# Patient Record
Sex: Female | Born: 1940 | Race: White | Hispanic: No | Smoking: Never smoker
Health system: Southern US, Community
[De-identification: ages and names within clinical notes are randomized; demographics above are authoritative.]

## PROBLEM LIST (undated history)

## (undated) ENCOUNTER — Ambulatory Visit (HOSPITAL_COMMUNITY): Admission: EM | Discharge status: Absent without leave | Payer: Medicare Other

## (undated) DIAGNOSIS — I219 Acute myocardial infarction, unspecified: Secondary | ICD-10-CM

## (undated) DIAGNOSIS — Z9289 Personal history of other medical treatment: Secondary | ICD-10-CM

## (undated) DIAGNOSIS — T8859XA Other complications of anesthesia, initial encounter: Secondary | ICD-10-CM

## (undated) DIAGNOSIS — R911 Solitary pulmonary nodule: Secondary | ICD-10-CM

## (undated) DIAGNOSIS — K219 Gastro-esophageal reflux disease without esophagitis: Secondary | ICD-10-CM

## (undated) DIAGNOSIS — J8410 Pulmonary fibrosis, unspecified: Secondary | ICD-10-CM

## (undated) DIAGNOSIS — Z86010 Personal history of colonic polyps: Secondary | ICD-10-CM

## (undated) DIAGNOSIS — E7800 Pure hypercholesterolemia, unspecified: Secondary | ICD-10-CM

## (undated) DIAGNOSIS — K649 Unspecified hemorrhoids: Secondary | ICD-10-CM

## (undated) DIAGNOSIS — M1990 Unspecified osteoarthritis, unspecified site: Secondary | ICD-10-CM

## (undated) DIAGNOSIS — N8110 Cystocele, unspecified: Secondary | ICD-10-CM

## (undated) DIAGNOSIS — I2510 Atherosclerotic heart disease of native coronary artery without angina pectoris: Secondary | ICD-10-CM

## (undated) DIAGNOSIS — Z947 Corneal transplant status: Secondary | ICD-10-CM

## (undated) DIAGNOSIS — G4730 Sleep apnea, unspecified: Secondary | ICD-10-CM

## (undated) DIAGNOSIS — Z860101 Personal history of adenomatous and serrated colon polyps: Secondary | ICD-10-CM

## (undated) DIAGNOSIS — I10 Essential (primary) hypertension: Secondary | ICD-10-CM

## (undated) DIAGNOSIS — G4762 Sleep related leg cramps: Secondary | ICD-10-CM

## (undated) DIAGNOSIS — T4145XA Adverse effect of unspecified anesthetic, initial encounter: Secondary | ICD-10-CM

## (undated) DIAGNOSIS — C480 Malignant neoplasm of retroperitoneum: Secondary | ICD-10-CM

## (undated) DIAGNOSIS — E669 Obesity, unspecified: Secondary | ICD-10-CM

## (undated) DIAGNOSIS — R0600 Dyspnea, unspecified: Secondary | ICD-10-CM

## (undated) DIAGNOSIS — I890 Lymphedema, not elsewhere classified: Secondary | ICD-10-CM

## (undated) DIAGNOSIS — D869 Sarcoidosis, unspecified: Secondary | ICD-10-CM

## (undated) DIAGNOSIS — R51 Headache: Secondary | ICD-10-CM

## (undated) DIAGNOSIS — R519 Headache, unspecified: Secondary | ICD-10-CM

## (undated) HISTORY — PX: UMBILICAL HERNIA REPAIR: SHX196

## (undated) HISTORY — DX: Gastro-esophageal reflux disease without esophagitis: K21.9

## (undated) HISTORY — DX: Sarcoidosis, unspecified: D86.9

## (undated) HISTORY — DX: Personal history of colonic polyps: Z86.010

## (undated) HISTORY — DX: Personal history of adenomatous and serrated colon polyps: Z86.0101

## (undated) HISTORY — DX: Acute myocardial infarction, unspecified: I21.9

## (undated) HISTORY — PX: COLONOSCOPY W/ BIOPSIES AND POLYPECTOMY: SHX1376

## (undated) HISTORY — PX: JOINT REPLACEMENT: SHX530

## (undated) HISTORY — PX: CATARACT EXTRACTION: SUR2

## (undated) HISTORY — PX: TONSILLECTOMY AND ADENOIDECTOMY: SHX28

## (undated) HISTORY — DX: Obesity, unspecified: E66.9

## (undated) HISTORY — PX: EYE SURGERY: SHX253

---
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
## 1968-05-13 HISTORY — PX: APPENDECTOMY: SHX54

## 1968-05-13 HISTORY — PX: CHOLECYSTECTOMY: SHX55

## 1976-05-13 HISTORY — PX: TUBAL LIGATION: SHX77

## 1988-05-13 HISTORY — PX: ABDOMINAL HYSTERECTOMY: SHX81

## 1999-05-10 ENCOUNTER — Encounter: Admission: RE | Admit: 1999-05-10 | Discharge: 1999-05-10 | Payer: Self-pay | Admitting: Family Medicine

## 1999-05-10 ENCOUNTER — Encounter: Payer: Self-pay | Admitting: Family Medicine

## 2000-07-01 ENCOUNTER — Encounter: Payer: Self-pay | Admitting: Family Medicine

## 2000-07-01 ENCOUNTER — Encounter: Admission: RE | Admit: 2000-07-01 | Discharge: 2000-07-01 | Payer: Self-pay | Admitting: Family Medicine

## 2001-07-03 ENCOUNTER — Encounter: Admission: RE | Admit: 2001-07-03 | Discharge: 2001-07-03 | Payer: Self-pay | Admitting: Family Medicine

## 2001-07-03 ENCOUNTER — Encounter: Payer: Self-pay | Admitting: Family Medicine

## 2002-05-13 HISTORY — PX: CARPAL TUNNEL RELEASE: SHX101

## 2002-09-08 ENCOUNTER — Encounter: Admission: RE | Admit: 2002-09-08 | Discharge: 2002-09-08 | Payer: Self-pay | Admitting: Family Medicine

## 2002-09-08 ENCOUNTER — Encounter: Payer: Self-pay | Admitting: Family Medicine

## 2003-09-12 ENCOUNTER — Encounter: Admission: RE | Admit: 2003-09-12 | Discharge: 2003-09-12 | Payer: Self-pay | Admitting: Family Medicine

## 2004-04-11 ENCOUNTER — Ambulatory Visit: Payer: Self-pay | Admitting: Pulmonary Disease

## 2004-04-25 ENCOUNTER — Ambulatory Visit (HOSPITAL_BASED_OUTPATIENT_CLINIC_OR_DEPARTMENT_OTHER): Admission: RE | Admit: 2004-04-25 | Discharge: 2004-04-25 | Payer: Self-pay | Admitting: Pulmonary Disease

## 2004-04-25 ENCOUNTER — Ambulatory Visit: Payer: Self-pay | Admitting: Pulmonary Disease

## 2004-05-13 HISTORY — PX: KNEE ARTHROSCOPY: SHX127

## 2004-06-04 ENCOUNTER — Ambulatory Visit: Payer: Self-pay | Admitting: Pulmonary Disease

## 2004-06-25 ENCOUNTER — Ambulatory Visit (HOSPITAL_COMMUNITY): Admission: RE | Admit: 2004-06-25 | Discharge: 2004-06-25 | Payer: Self-pay | Admitting: Family Medicine

## 2004-09-07 ENCOUNTER — Ambulatory Visit: Payer: Self-pay | Admitting: Pulmonary Disease

## 2004-10-05 ENCOUNTER — Ambulatory Visit: Payer: Self-pay | Admitting: Pulmonary Disease

## 2004-10-17 ENCOUNTER — Encounter: Admission: RE | Admit: 2004-10-17 | Discharge: 2004-10-17 | Payer: Self-pay | Admitting: Family Medicine

## 2005-05-13 HISTORY — PX: HERNIA REPAIR: SHX51

## 2005-06-21 ENCOUNTER — Encounter: Admission: RE | Admit: 2005-06-21 | Discharge: 2005-06-21 | Payer: Self-pay | Admitting: Family Medicine

## 2005-06-28 ENCOUNTER — Emergency Department (HOSPITAL_COMMUNITY): Admission: EM | Admit: 2005-06-28 | Discharge: 2005-06-28 | Payer: Self-pay | Admitting: Family Medicine

## 2005-07-10 ENCOUNTER — Ambulatory Visit: Payer: Self-pay | Admitting: Pulmonary Disease

## 2005-08-08 ENCOUNTER — Ambulatory Visit: Payer: Self-pay | Admitting: Pulmonary Disease

## 2005-09-24 ENCOUNTER — Emergency Department (HOSPITAL_COMMUNITY): Admission: EM | Admit: 2005-09-24 | Discharge: 2005-09-24 | Payer: Self-pay | Admitting: Emergency Medicine

## 2006-01-21 ENCOUNTER — Encounter: Admission: RE | Admit: 2006-01-21 | Discharge: 2006-01-21 | Payer: Self-pay | Admitting: Family Medicine

## 2006-02-21 ENCOUNTER — Inpatient Hospital Stay (HOSPITAL_COMMUNITY): Admission: RE | Admit: 2006-02-21 | Discharge: 2006-02-23 | Payer: Self-pay | Admitting: Surgery

## 2007-03-20 ENCOUNTER — Encounter: Admission: RE | Admit: 2007-03-20 | Discharge: 2007-03-20 | Payer: Self-pay | Admitting: Family Medicine

## 2007-06-04 ENCOUNTER — Encounter: Admission: RE | Admit: 2007-06-04 | Discharge: 2007-06-04 | Payer: Self-pay | Admitting: Family Medicine

## 2008-03-22 ENCOUNTER — Encounter: Admission: RE | Admit: 2008-03-22 | Discharge: 2008-03-22 | Payer: Self-pay | Admitting: Family Medicine

## 2008-04-20 ENCOUNTER — Encounter: Admission: RE | Admit: 2008-04-20 | Discharge: 2008-04-20 | Payer: Self-pay | Admitting: Family Medicine

## 2008-05-13 HISTORY — PX: SHOULDER SURGERY: SHX246

## 2009-01-24 DIAGNOSIS — I10 Essential (primary) hypertension: Secondary | ICD-10-CM | POA: Insufficient documentation

## 2009-01-24 DIAGNOSIS — E785 Hyperlipidemia, unspecified: Secondary | ICD-10-CM | POA: Insufficient documentation

## 2009-01-25 ENCOUNTER — Ambulatory Visit: Payer: Self-pay | Admitting: Pulmonary Disease

## 2009-01-25 DIAGNOSIS — G4733 Obstructive sleep apnea (adult) (pediatric): Secondary | ICD-10-CM

## 2009-01-28 ENCOUNTER — Encounter: Admission: RE | Admit: 2009-01-28 | Discharge: 2009-01-28 | Payer: Self-pay | Admitting: Orthopedic Surgery

## 2009-02-22 ENCOUNTER — Ambulatory Visit (HOSPITAL_COMMUNITY): Admission: RE | Admit: 2009-02-22 | Discharge: 2009-02-23 | Payer: Self-pay | Admitting: Orthopedic Surgery

## 2009-03-09 ENCOUNTER — Telehealth (INDEPENDENT_AMBULATORY_CARE_PROVIDER_SITE_OTHER): Payer: Self-pay | Admitting: *Deleted

## 2009-04-17 ENCOUNTER — Encounter: Payer: Self-pay | Admitting: Pulmonary Disease

## 2009-04-17 ENCOUNTER — Telehealth: Payer: Self-pay | Admitting: Pulmonary Disease

## 2009-04-24 ENCOUNTER — Encounter: Admission: RE | Admit: 2009-04-24 | Discharge: 2009-04-24 | Payer: Self-pay | Admitting: Family Medicine

## 2010-04-25 ENCOUNTER — Encounter
Admission: RE | Admit: 2010-04-25 | Discharge: 2010-04-25 | Payer: Self-pay | Source: Home / Self Care | Attending: Family Medicine | Admitting: Family Medicine

## 2010-05-13 HISTORY — PX: OTHER SURGICAL HISTORY: SHX169

## 2010-05-13 HISTORY — PX: CORONARY ANGIOPLASTY: SHX604

## 2010-06-15 ENCOUNTER — Ambulatory Visit: Payer: Self-pay | Admitting: Pulmonary Disease

## 2010-06-18 ENCOUNTER — Encounter: Payer: Self-pay | Admitting: Pulmonary Disease

## 2010-06-18 ENCOUNTER — Ambulatory Visit (INDEPENDENT_AMBULATORY_CARE_PROVIDER_SITE_OTHER): Payer: Self-pay | Admitting: Pulmonary Disease

## 2010-06-18 DIAGNOSIS — G4733 Obstructive sleep apnea (adult) (pediatric): Secondary | ICD-10-CM

## 2010-06-28 NOTE — Assessment & Plan Note (Signed)
Summary: rov for osa   Primary Provider/Referring Provider:  [name]  CC:  Follow up appt on OSA.  States she wears her cpap machine every night.  Approx 10 hours per night.  Denies any complaints with mask or pressure.  Pt does state her machine makes "weird noises."  Pt also needs surgical clearance.  scheduled for L knee arthroscopy on March 19 with Dr. Maureen Ralphs.  History of Present Illness: the pt comes in today for f/u of her known osa.  She is wearing cpap compliantly, and overall feels she is doing well with the device.  She is having some "flapping" with her mask that may wake her up, and her machine is making noise at times.  Her husband has also heard breakthru snoring and apnea when she turns supine during the night.  Her weight is actually down 10 pounds from last visit.  Current Medications (verified): 1)  Benicar 40 Mg Tabs (Olmesartan Medoxomil) .... Take 1 Tablet By Mouth Once A Day 2)  Diltiazem Hcl Coated Beads 300 Mg Xr24h-Tab (Diltiazem Hcl Coated Beads) .... Take 1 Tablet By Mouth Once A Day 3)  Furosemide 40 Mg Tabs (Furosemide) .... Take 1 Tablet By Mouth Once A Day 4)  Pravastatin Sodium 40 Mg Tabs (Pravastatin Sodium) .... Take 1 Tablet By Mouth Once A Day 5)  Aspirin Childrens 81 Mg Chew (Aspirin) 6)  Centrum  Tabs (Multiple Vitamins-Minerals) .... Take 1 Tablet By Mouth Once A Day 7)  Vitamin B-6 Cr 200 Mg Cr-Tabs (Pyridoxine Hcl) .... Take 1 Tablet By Mouth Once A Day 8)  Calcium 1200 1200-1000 Mg-Unit Chew (Calcium Carbonate-Vit D-Min) .... Take 1 Tablet By Mouth Once A Day 9)  Vitamin D 2000 Unit Tabs (Cholecalciferol) .... Take 1 Tablet By Mouth Once A Day 10)  Prilosec 20 Mg Cpdr (Omeprazole) .... Take 1 Tablet By Mouth Once A Day  Allergies (verified): 1)  ! Codeine 2)  ! Pcn 3)  ! Neomycin  Review of Systems       The patient complains of productive cough.  The patient denies shortness of breath with activity, shortness of breath at rest,  non-productive cough, coughing up blood, chest pain, irregular heartbeats, acid heartburn, indigestion, loss of appetite, weight change, abdominal pain, difficulty swallowing, sore throat, tooth/dental problems, headaches, nasal congestion/difficulty breathing through nose, sneezing, itching, ear ache, anxiety, depression, hand/feet swelling, joint stiffness or pain, rash, change in color of mucus, and fever.    Vital Signs:  Patient profile:   70 year old female Height:      62.5 inches Weight:      221.38 pounds BMI:     39.99 O2 Sat:      98. % on Room air Temp:     98.4 degrees F oral Pulse rate:   62 / minute BP sitting:   134 / 84  (left arm) Cuff size:   large  Vitals Entered By: Matthew Folks LPN (June 18, 930 12:06 PM)  O2 Flow:  Room air CC: Follow up appt on OSA.  States she wears her cpap machine every night.  Approx 10 hours per night.  Denies any complaints with mask or pressure.  Pt does state her machine makes "weird noises."  Pt also needs surgical clearance.  scheduled for L knee arthroscopy on March 19 with Dr. Maureen Ralphs Comments Medications reviewed with patient Matthew Folks LPN  June 18, 6710 12:06 PM     Physical Exam  General:  ow  female in nad Nose:  no skin breakdown or pressure necrosis from cpap mask Extremities:  1+ edema, no cyanosis  Neurologic:  alert, does not appear sleepy, moves all 4.   Impression & Recommendations:  Problem # 1:  OBSTRUCTIVE SLEEP APNEA (ICD-327.23) the pt is wearing cpap compliantly, but has a few issues that we need to troubleshoot.  I suspect that she needs to get seals to her mask more frequently than she is, or perhaps need to pull her current mask tighter?.  Will have dme check this, as well as the functioning of her machine.  I think it is also worthwhile rechecking her pressure since her husband has noted breakthru snoring whenever she is on her back.  Faylene Kurtz is doing well with the device and feels she sleeps  well with adequate daytime alertness.  Her weight is down 10pounds since her last visit, and she is continuing to work on this.  Regarding her upcoming knee surgery, I see no reason she cannot have this from a sleep apnea standpoint.  I have asked her to bring her mask and headgear to the hospital, and they will provide her with an auto machine during her stay there.    Medications Added to Medication List This Visit: 1)  Prilosec 20 Mg Cpdr (Omeprazole) .... Take 1 tablet by mouth once a day  Other Orders: Est. Patient Level III (78412) DME Referral (DME)  Patient Instructions: 1)  will see how often you are allowed to change your seals on your mask 2)  will get sleep med to check your machine and see if it needs to be replaced. 3)  will recheck your pressure at home, and let you known the results. 4)  work on weight loss 5)  followup with me in one year, but call if something is not going well so I can help you troubleshoot.

## 2010-07-12 HISTORY — PX: TOTAL KNEE ARTHROPLASTY: SHX125

## 2010-07-24 ENCOUNTER — Other Ambulatory Visit: Payer: Self-pay | Admitting: Orthopedic Surgery

## 2010-07-24 ENCOUNTER — Encounter (HOSPITAL_COMMUNITY): Payer: Medicare Other

## 2010-07-24 ENCOUNTER — Ambulatory Visit (HOSPITAL_COMMUNITY)
Admission: RE | Admit: 2010-07-24 | Discharge: 2010-07-24 | Disposition: A | Discharge status: Home / Self Care | Payer: Medicare Other | Source: Ambulatory Visit | Attending: Orthopedic Surgery | Admitting: Orthopedic Surgery

## 2010-07-24 ENCOUNTER — Other Ambulatory Visit (HOSPITAL_COMMUNITY): Payer: Self-pay | Admitting: Orthopedic Surgery

## 2010-07-24 DIAGNOSIS — Z01818 Encounter for other preprocedural examination: Secondary | ICD-10-CM | POA: Insufficient documentation

## 2010-07-24 DIAGNOSIS — I517 Cardiomegaly: Secondary | ICD-10-CM | POA: Insufficient documentation

## 2010-07-24 DIAGNOSIS — M1710 Unilateral primary osteoarthritis, unspecified knee: Secondary | ICD-10-CM

## 2010-07-24 DIAGNOSIS — I10 Essential (primary) hypertension: Secondary | ICD-10-CM | POA: Insufficient documentation

## 2010-07-24 DIAGNOSIS — Z7982 Long term (current) use of aspirin: Secondary | ICD-10-CM | POA: Insufficient documentation

## 2010-07-24 DIAGNOSIS — Z79899 Other long term (current) drug therapy: Secondary | ICD-10-CM | POA: Insufficient documentation

## 2010-07-24 DIAGNOSIS — I77819 Aortic ectasia, unspecified site: Secondary | ICD-10-CM | POA: Insufficient documentation

## 2010-07-24 DIAGNOSIS — Z01811 Encounter for preprocedural respiratory examination: Secondary | ICD-10-CM | POA: Insufficient documentation

## 2010-07-24 DIAGNOSIS — M5380 Other specified dorsopathies, site unspecified: Secondary | ICD-10-CM | POA: Insufficient documentation

## 2010-07-24 DIAGNOSIS — Z01812 Encounter for preprocedural laboratory examination: Secondary | ICD-10-CM | POA: Insufficient documentation

## 2010-07-24 LAB — CBC
MCHC: 32.4 g/dL (ref 30.0–36.0)
MCV: 94.2 fL (ref 78.0–100.0)
Platelets: 210 10*3/uL (ref 150–400)
RDW: 14.3 % (ref 11.5–15.5)

## 2010-07-24 LAB — APTT: aPTT: 29 seconds (ref 24–37)

## 2010-07-24 LAB — URINALYSIS, ROUTINE W REFLEX MICROSCOPIC
Bilirubin Urine: NEGATIVE
Ketones, ur: NEGATIVE mg/dL
Nitrite: NEGATIVE

## 2010-07-24 LAB — COMPREHENSIVE METABOLIC PANEL
Alkaline Phosphatase: 79 U/L (ref 39–117)
BUN: 9 mg/dL (ref 6–23)
CO2: 29 mEq/L (ref 19–32)
Chloride: 108 mEq/L (ref 96–112)
GFR calc Af Amer: >60 mL/min (ref >60)
Potassium: 3.7 mEq/L (ref 3.5–5.1)
Total Bilirubin: 0.6 mg/dL (ref 0.3–1.2)

## 2010-07-24 LAB — SURGICAL PCR SCREEN
MRSA, PCR: NEGATIVE
Staphylococcus aureus: POSITIVE — AB

## 2010-07-30 ENCOUNTER — Inpatient Hospital Stay (HOSPITAL_COMMUNITY)
Admission: RE | Admit: 2010-07-30 | Discharge: 2010-08-02 | DRG: 470 | Disposition: A | Discharge status: Home Health | Payer: Medicare Other | Source: Ambulatory Visit | Attending: Orthopedic Surgery | Admitting: Orthopedic Surgery

## 2010-07-30 DIAGNOSIS — M1710 Unilateral primary osteoarthritis, unspecified knee: Principal | ICD-10-CM | POA: Diagnosis present

## 2010-07-30 DIAGNOSIS — E876 Hypokalemia: Secondary | ICD-10-CM | POA: Diagnosis not present

## 2010-07-30 DIAGNOSIS — E669 Obesity, unspecified: Secondary | ICD-10-CM | POA: Diagnosis present

## 2010-07-30 DIAGNOSIS — I10 Essential (primary) hypertension: Secondary | ICD-10-CM | POA: Diagnosis present

## 2010-07-30 DIAGNOSIS — K219 Gastro-esophageal reflux disease without esophagitis: Secondary | ICD-10-CM | POA: Diagnosis present

## 2010-07-31 LAB — BASIC METABOLIC PANEL
BUN: 6 mg/dL (ref 6–23)
CO2: 29 mEq/L (ref 19–32)
Calcium: 7.9 mg/dL — ABNORMAL LOW (ref 8.4–10.5)
Chloride: 105 mEq/L (ref 96–112)
Creatinine, Ser: 0.62 mg/dL (ref 0.4–1.2)
GFR calc Af Amer: >60 mL/min (ref >60)
GFR calc non Af Amer: >60 mL/min (ref >60)
Sodium: 137 mEq/L (ref 135–145)

## 2010-07-31 LAB — CBC
MCH: 30.4 pg (ref 26.0–34.0)
MCHC: 32.2 g/dL (ref 30.0–36.0)
RDW: 14.3 % (ref 11.5–15.5)
WBC: 11.2 10*3/uL — ABNORMAL HIGH (ref 4.0–10.5)

## 2010-08-01 LAB — BASIC METABOLIC PANEL
CO2: 29 mEq/L (ref 19–32)
Calcium: 8.1 mg/dL — ABNORMAL LOW (ref 8.4–10.5)
Chloride: 104 mEq/L (ref 96–112)
GFR calc non Af Amer: >60 mL/min (ref >60)
Glucose, Bld: 120 mg/dL — ABNORMAL HIGH (ref 70–99)
Potassium: 3.4 mEq/L — ABNORMAL LOW (ref 3.5–5.1)

## 2010-08-01 LAB — CBC
Hemoglobin: 11.9 g/dL — ABNORMAL LOW (ref 12.0–15.0)
RDW: 14.4 % (ref 11.5–15.5)
WBC: 8.8 10*3/uL (ref 4.0–10.5)

## 2010-08-01 NOTE — Op Note (Signed)
NAME:  Isabella [name], Isabella [name]                 ACCOUNT NO.:  [id number]  MEDICAL RECORD NO.:  [id number]           PATIENT TYPE:  I  LOCATION:  3419                         FACILITY:  [facility]  PHYSICIAN:  Gaynelle Arabian, M.D.    DATE OF BIRTH:  10-31-1940  DATE OF PROCEDURE:  07/30/2010 DATE OF DISCHARGE:                              OPERATIVE REPORT   PREOPERATIVE DIAGNOSIS:  Osteoarthritis, left knee.  POSTOPERATIVE DIAGNOSIS:  Osteoarthritis, left knee.  PROCEDURE:  Left total knee arthroplasty.  SURGEON:  Gaynelle Arabian, M.D.  ASSISTANT:  Toniann Fail, PA-C  ANESTHESIA:  Spinal.  ESTIMATED BLOOD LOSS:  Minimal.  DRAINS:  Hemovac x1.  TOURNIQUET TIME:  41 minutes at 300 mmHg.  COMPLICATIONS:  None.  CONDITION:  Stable to recovery.  BRIEF CLINICAL NOTE:  Isabella [name] is a 70 year old female with end-stage arthritis of the left knee with progressively worsening pain and dysfunction.  She has failed nonoperative management and presents for total knee arthroplasty.  PROCEDURE IN DETAIL:  After successful administration of spinal anesthetic, A tourniquet was placed high on the left thigh and left lower extremity prepped and draped in usual sterile fashion.  Extremity was wrapped in Esmarch, knee flexed, tourniquet inflated to 300 mmHg.  A midline incision was then made with a 10 blade through subcutaneous tissue to the level of the extensor mechanism.  Fresh blade was used to make a medial parapatellar arthrotomy.  Soft tissue on the proximal medial tibia was subperiosteally elevated to the joint line with the knife into the semimembranosus bursa with a Cobb elevator.  Soft tissue laterally was elevated with attention being paid to avoiding the patellar tendon on tibial tubercle.  The patella was everted, knee flexed 90 degrees, and ACL and PCL removed.  Drill was used to create a starting hole in the distal femur.  The canal was thoroughly irrigated. The 5-degree left valgus  alignment guide was placed and a distal femoral cutting block pinned to remove 11 mm off the distal femur.  Distal femoral resection was made with an oscillating saw.  The tibia subluxed forward and the menisci removed.  Extramedullary tibial alignment guide was placed referencing proximally at the medial aspect of the tibial tubercle and distally along the 2nd metatarsal axis and tibial crest.  The block was pinned to remove 2 mm off the more deficient medial side.  Tibial resection was made with an oscillating saw.  Size 2.5 was the most appropriate tibial component and the proximal tibia was prepared with a modular drill and keel punch for the size 2.5.  Femoral sizing guide was placed, size 3 was most appropriate.  Rotation was marked off the epicondylar axis and confirmed by creating a rectangular flexion gap at 90 degrees.  The block was pinned in this rotation and the anterior, posterior, and chamfer cuts were made. Intercondylar block was placed and that cut was made.  Trial size 3 posterior stabilized femur was placed.  A 10 mm posterior stabilizedrotating platform insert trial was placed.  With the 10, full extension was achieved with excellent varus-valgus and anterior-posterior balance throughout full range of  motion.  Patella was everted, thickness measured to be 22 mm.  Freehand resection was taken at 13 mm, 35 template was placed, lug holes were drilled, trial patella was placed and it tracked normally.  Osteophytes removed off the posterior femur with the trial in place.  All trials were removed and the cut bone surfaces were prepared with pulsatile lavage.  Cement was mixed and once ready for implantation, the size 2.5 mobile bearing tibial tray, size 3 posterior stabilized femur, and 35 patella were cemented into place and the patella was held with a clamp.  Trial 10 mm insert was placed, knee held in full extension, all extruded cement removed.  When the cement was  fully hardened, then the permanent 10 mm posterior stabilized rotating platform insert was placed in the tibial tray.  Wound was copiously irrigated with saline solution and then the arthrotomy closed over Hemovac drain with interrupted #1 PDS.  Flexion against gravity was about 135 degrees.  Tourniquet was then released for a total time of 31 minutes.  Subcu was then closed with interrupted 2-0 Vicryl, subcuticular with running 4-0 Monocryl.  Catheter for Marcaine pain pump was placed and the pump was initiated.  Incision was cleaned and dried and Steri-Strips and a bulky sterile dressing applied.  She was then placed into a knee immobilizer, awakened, and transported to Recovery in stable condition.     Gaynelle Arabian, M.D.     FA/MEDQ  D:  07/30/2010  T:  07/31/2010  Job:  [id number]  Electronically Signed by Gaynelle Arabian M.D. on 08/01/2010 08:20:39 AM

## 2010-08-02 LAB — BASIC METABOLIC PANEL
Calcium: 8.4 mg/dL (ref 8.4–10.5)
Chloride: 107 mEq/L (ref 96–112)
Creatinine, Ser: 0.62 mg/dL (ref 0.4–1.2)
GFR calc Af Amer: >60 mL/min (ref >60)
GFR calc non Af Amer: >60 mL/min (ref >60)
Glucose, Bld: 118 mg/dL — ABNORMAL HIGH (ref 70–99)
Potassium: 4.3 mEq/L (ref 3.5–5.1)

## 2010-08-02 LAB — CBC: MCH: 30.2 pg (ref 26.0–34.0)

## 2010-08-06 NOTE — H&P (Signed)
NAME:  Isabella [name], Isabella [name]                 ACCOUNT NO.:  [id number]  MEDICAL RECORD NO.:                     PATIENT TYPE:  LOCATION:                                 FACILITY:  PHYSICIAN:  Gaynelle Arabian, M.D.         DATE OF BIRTH:  DATE OF ADMISSION:  07/30/2010 DATE OF DISCHARGE:                             HISTORY & PHYSICAL   DATE OF OFFICE VISIT AND HISTORY AND PHYSICAL:  July 26, 2010  DATE OF ADMISSION:  July 30, 2010  CHIEF COMPLAINT:  Left knee pain.  HISTORY OF PRESENT ILLNESS:  The patient is a 70 year old female who has been seen by Dr. Wynelle Link for ongoing left knee pain and arthritis been refractory to nonoperative management.  Has arthritis in both knees but the left is more symptomatic and problematic than the right.  It was felt she would benefit from undergoing surgical intervention.  Risks and benefits have been discussed.  She has been seen preoperatively by Dr. Inda Merlin and she is felt to be stable for upcoming surgery.  ALLERGIES: 1. PENICILLIN caused rash and trouble breathing at age 70. 2. NEOMYCIN caused swelling topically in that area. 3. CODEINE causes "tightness in chest" and the patient states she is     sensitive to medications.  CURRENT MEDICATIONS: 1. Benicar 40 mg daily. 2. Diltiazem 300 mg daily. 3. Furosemide 40 mg daily. 4. Pravastatin 40 mg daily. 5. Baby aspirin. 6. Omeprazole 20.6 mg daily. 7. Centrum Silver. 8. Multivitamin. 9. Vitamin B6. 10.Calcium. 11.Vitamin D. 12.Glucosamine chondroitin.  PAST MEDICAL HISTORY: 1. Hypertension. 2. Sleep apnea. 3. Hypercholesterolemia. 4. Past history of phlebitis (but no DVT or PE) and arthritis.  PAST SURGICAL HISTORY:  Tonsils at age 70, gallbladder and appendix surgery, hysterectomy with hernia repair, carpal tunnel bilaterally, right knee arthroscopy, hernia repair, left eye cataract surgery, left eye corneal transplant, right shoulder arthroscopic surgery, sleep apnea procedure and then  another cataract and corneal transplant.  FAMILY HISTORY:  Father deceased age 41 with cancer.  Mother deceased at age 57 (choked on food).  SOCIAL HISTORY:  Married, retired, nonsmoker.  No alcohol.  Husband will be assisting with care after surgery.  She has one step entering her home.  She does have a living will.  REVIEW OF SYSTEMS:  GENERAL:  No fevers, chills, night sweats.  NEURO: No seizures, syncope, or paralysis.  RESPIRATORY:  No shortness breath, productive cough or hemoptysis.  CARDIOVASCULAR:  No chest pain, no orthopnea.  GI:  No nausea, vomiting, diarrhea, or constipation.  GU: No dysuria, hematuria, or discharge.  Musculoskeletal:  Left knee.  PHYSICAL EXAMINATION:  VITAL SIGNS:  Pulse 64, respirations 12, blood pressure 144/88. GENERAL:  A : 70 year old white female, well nourished, well developed, in no acute distress.  She is alert, oriented, cooperative. HEENT:  Normocephalic, atraumatic.  Pupils round and reactive.  EOMs intact.  No glasses. NECK:  Supple. CHEST:  Clear. HEART:  Regular rate and rhythm without murmur.  S1, S2 noted. ABDOMEN:  Soft, nontender, slightly round.  Bowel sounds present. RECTAL:  Not done, not pertinent to present illness. BREASTS:  Not done, not pertinent to present illness. GENITALIA:  Not done, not pertinent to present illness. EXTREMITIES:  Left knee no effusion, no instability.  Positive crepitus. Range of motion 5 to 120.  IMPRESSION:  Osteoarthritis, left knee.  PLAN:  The patient will be admitted to [facility], undergo left total knee replacement arthroplasty.  Surgery will be performed by Dr. Gaynelle Arabian.     Alexzandrew L. Dara Lords, P.A.C.   ______________________________ Gaynelle Arabian, M.D.    ALP/MEDQ  D:  07/28/2010  T:  07/28/2010  Job:  [id number]  cc:   Frann Rider, M.D. Fax: [phone]  Kathee Delton, MD,FCCP [address] [address]  Electronically Signed by Mickel Crow P.A.C. on 08/03/2010 10:30:40 AM Electronically Signed by Gaynelle Arabian M.D. on 08/06/2010 07:11:00 AM

## 2010-08-16 LAB — BASIC METABOLIC PANEL
BUN: 9 mg/dL (ref 6–23)
CO2: 30 mEq/L (ref 19–32)
Chloride: 101 mEq/L (ref 96–112)
GFR calc Af Amer: >60 mL/min (ref >60)
Glucose, Bld: 100 mg/dL — ABNORMAL HIGH (ref 70–99)
Sodium: 139 mEq/L (ref 135–145)

## 2010-08-16 LAB — CBC
MCV: 93.9 fL (ref 78.0–100.0)
Platelets: 220 10*3/uL (ref 150–400)
RBC: 4.65 MIL/uL (ref 3.87–5.11)
WBC: 5.9 10*3/uL (ref 4.0–10.5)

## 2010-09-22 ENCOUNTER — Other Ambulatory Visit: Payer: Self-pay | Admitting: Pulmonary Disease

## 2010-09-22 DIAGNOSIS — G4733 Obstructive sleep apnea (adult) (pediatric): Secondary | ICD-10-CM

## 2010-09-28 NOTE — Procedures (Signed)
NAME:  Isabella [name], Isabella [name] NO.:  [id number]   MEDICAL RECORD NO.:  [id number]          PATIENT TYPE:  OUT   LOCATION:  SLEEP CENTER                 FACILITY:  [facility]   PHYSICIAN:  Danton Sewer, M.D. [facility] DATE OF BIRTH:  08-04-1940   DATE OF STUDY:  04/25/2004                              NOCTURNAL POLYSOMNOGRAM   REFERRING PHYSICIAN:  Danton Sewer, MD   INDICATIONS FOR STUDY:  Hypersomnia with sleep apnea. Epworth sleepiness  score is 4.   SLEEP ARCHITECTURE:  The patient has a total sleep time of 307 minutes with  a sleep efficiency of 89%. There was decreased REM and slow wave sleep.  Latency to sleep onset was normal, however REM onset was prolonged.   IMPRESSION/RECOMMENDATIONS:  1.  Mild to moderate obstructive sleep apnea with a respiratory disturbance      index of 16 events and O2 desaturation events as low as 87%. The events      were worse in the supine position.  2.  Loud snoring noted throughout the snoring.  3.  No clinically significant cardiac arrhythmias.      KC/MEDQ  D:  04/30/2004 15:14:41  T:  05/01/2004 18:31:14  Job:  [id number]

## 2010-09-28 NOTE — Op Note (Signed)
NAME:  Isabella [name], Isabella [name]                 ACCOUNT NO.:  [id number]   MEDICAL RECORD NO.:  [id number]          PATIENT TYPE:  INP   LOCATION:  [place]                         FACILITY:  [facility]   PHYSICIAN:  Marcello Moores A. Cornett, M.D.DATE OF BIRTH:  1941-01-04   DATE OF PROCEDURE:  02/21/2006  DATE OF DISCHARGE:  02/23/2006                                 OPERATIVE REPORT   PREOPERATIVE DIAGNOSIS:  Recurrent ventral hernia.   POSTOPERATIVE DIAGNOSIS:  Incarcerated recurrent ventral hernia.   PROCEDURE:  Laparoscopic ventral hernia repair with mesh.   SURGEON:  Erroll Luna, MD   ASSISTANT:  Dr. Magdalene River.   ANESTHESIA:  General endotracheal anesthesia with 20 mL of 0.25% Sensorcaine  local with epinephrine EBL 20 mL.   SPECIMEN:  None.   DRAINS:  None.   INDICATIONS FOR PROCEDURE:  The patient is a 70 year old female who had a  previous hysterectomy and ventral hernia repair back in the 1990s.  This has  recurred in an area just below her umbilicus.  It had been giving her some  problems and she wished to have repair secondary to discomfort.  The  procedure was discussed with the patient as well as the potential need to  convert to an open procedure as well as complications of the procedure.  She  voiced understanding agreed to proceed.   DESCRIPTION OF PROCEDURE:  The patient was brought to the operating room,  placed supine.  After induction of general endotracheal anesthesia the  abdomen prepped and draped in sterile fashion.  Foley catheter was placed  and an orogastric tube was then placed.  After sterile prep and drape, I  elected to use a 10 mm OptiVu for access to the abdominal cavity.  The left  lower quadrant 1 cm incision was made and I placed the camera within the  OptiVu port.  We then under direct camera guidance, guided the OptiVu  through the abdominal wall layers and also to the peritoneum into the  abdominal cavity.  Pneumoperitoneum was established to 15 mmHg CO2.   The  camera was replaced.  Upon inspection of the abdominal cavity I saw no  evidence of injury to solid or hollow organ with insertion of the OptiVu.  There is significant adhesions to the anterior abdominal wall and hernia  defect was identified with a piece of incarcerated omentum which was not  ischemic.  I placed a 5 mm left lower quadrant port under direct vision with  local anesthesia.  I used the Endo shears take down the adhesions to the  anterior abdominal wall.  Care taken not to injure the bowel.  There was  some mild bleeding with that.  I used the harmonic scalpel to control one  bleeding vessel with the omentum with care taken not to get it close the  bowel.  Once the anterior abdominal wall adhesions were down I was able to  identify numerous small fascial defects starting just below the umbilicus  extending to just above the umbilicus.  Total area measured roughly 9 cm x 7  cm.  This was  measured using the laparoscopic grasper whose jaws were 3 cm  when opened maximally.  I then used a spinal needle to mark the external  landmarks and this was inserted under direct vision into the abdominal  cavity with care taken not to injure the bowel.  Luetta Nutting were made to get an  idea of the size of this and this was closer to roughly 12 x 15 cm.  I used  a 15 x 20 piece of Parietex mesh.  This seemed to give me a least 3 cm of  overlap.  Eight stay sutures were placed using #1 Novofils in the Parietex  mesh and then the mesh was placed a water to saturate it.  It was rolled up  and then placed through the 10-mm port into the intra-abdominal cavity.  I  had to place two additional 5-mm ports, one of the patient's right upper  quadrant and the second in the patient's right lower quadrant to help with  this.  With the help of the 5 mm camera we were able to position the mesh  and then unraveled it so it laid under the appropriate marks we made on the  anterior abdominal wall to orient it.   We then made eight small incisions in  a circular pattern that out laid the hernia defect plus overlap.  We then  used the Storz needle passer and this was passed through the abdominal wall.  The sutures were grabbed and pulled up and held in place with hemostats.  We  pulled all eight sutures up and tied these down and the mesh lay relatively  flat with minimal pleating noted.  The Protac was then used to tack the mesh  circumferentially and this made the mesh lay quite nicely.  There is a mild  amount of redundancy noted.  There was no undue tension.  Once the entire  mesh was tacked down and laid quite nicely and covered the defect with more  than adequate overlap of 3 cm.  I then released the CO2 and then reinflated  the abdominal cavity to inspect for any evidence of bowel injury and saw  none.  Hemostasis was excellent.  At this point, we decided to remove our  ports and released the CO2.  I did not see any evidence of port site  bleeding.  Once all ports were subsequently removed and passed off the  field.  The incisions were closed 4-0 Monocryl subcuticular stitches.  Steri-  Strips and dry dressings were applied.  All final counts of sponge, needle  and instruments were found to be correct this portion of the case.  The  patient was awoke, taken to recovery in satisfactory condition.      Thomas A. Cornett, M.D.  Electronically Signed     TAC/MEDQ  D:  02/21/2006  T:  02/24/2006  Job:  [id number]   cc:   Frann Rider, M.D.  Fax: [phone]

## 2010-10-04 ENCOUNTER — Telehealth: Payer: Self-pay | Admitting: Pulmonary Disease

## 2010-10-04 ENCOUNTER — Other Ambulatory Visit: Payer: Self-pay | Admitting: Pulmonary Disease

## 2010-10-04 DIAGNOSIS — G4733 Obstructive sleep apnea (adult) (pediatric): Secondary | ICD-10-CM

## 2010-10-04 NOTE — Telephone Encounter (Signed)
Will send order for new cpap machine

## 2010-10-04 NOTE — Telephone Encounter (Signed)
KC, pt was seen last 2/12- is this okay to send CPAP order to [facility]? Pls advise thanks!

## 2010-10-05 NOTE — Telephone Encounter (Signed)
Called and lmom to make pt aware that the new order for new cpap machine has been sent per [facility].

## 2010-10-05 NOTE — Telephone Encounter (Signed)
ATC pt, line busy x 4 WCB

## 2011-03-04 DIAGNOSIS — H40049 Steroid responder, unspecified eye: Secondary | ICD-10-CM | POA: Insufficient documentation

## 2011-03-04 DIAGNOSIS — H2640 Unspecified secondary cataract: Secondary | ICD-10-CM | POA: Insufficient documentation

## 2011-03-04 DIAGNOSIS — Z947 Corneal transplant status: Secondary | ICD-10-CM | POA: Insufficient documentation

## 2011-03-14 HISTORY — PX: CARDIAC CATHETERIZATION: SHX172

## 2011-03-21 ENCOUNTER — Other Ambulatory Visit: Payer: Self-pay | Admitting: Family Medicine

## 2011-03-21 DIAGNOSIS — Z1231 Encounter for screening mammogram for malignant neoplasm of breast: Secondary | ICD-10-CM

## 2011-03-24 ENCOUNTER — Encounter (HOSPITAL_COMMUNITY)
Admission: EM | Disposition: A | Discharge status: Home Health | Payer: Self-pay | Source: Ambulatory Visit | Attending: Interventional Cardiology

## 2011-03-24 ENCOUNTER — Inpatient Hospital Stay (HOSPITAL_COMMUNITY)
Admission: EM | Admit: 2011-03-24 | Discharge: 2011-03-29 | DRG: 246 | Disposition: A | Discharge status: Home Health | Payer: Medicare Other | Source: Ambulatory Visit | Attending: Interventional Cardiology | Admitting: Interventional Cardiology

## 2011-03-24 DIAGNOSIS — R600 Localized edema: Secondary | ICD-10-CM

## 2011-03-24 DIAGNOSIS — I10 Essential (primary) hypertension: Secondary | ICD-10-CM | POA: Diagnosis present

## 2011-03-24 DIAGNOSIS — E785 Hyperlipidemia, unspecified: Secondary | ICD-10-CM | POA: Diagnosis present

## 2011-03-24 DIAGNOSIS — E669 Obesity, unspecified: Secondary | ICD-10-CM | POA: Diagnosis present

## 2011-03-24 DIAGNOSIS — I2542 Coronary artery dissection: Secondary | ICD-10-CM | POA: Diagnosis not present

## 2011-03-24 DIAGNOSIS — I519 Heart disease, unspecified: Secondary | ICD-10-CM | POA: Diagnosis not present

## 2011-03-24 DIAGNOSIS — I2119 ST elevation (STEMI) myocardial infarction involving other coronary artery of inferior wall: Principal | ICD-10-CM | POA: Diagnosis present

## 2011-03-24 DIAGNOSIS — I97638 Postprocedural hematoma of a circulatory system organ or structure following other circulatory system procedure: Secondary | ICD-10-CM | POA: Diagnosis not present

## 2011-03-24 DIAGNOSIS — Z7902 Long term (current) use of antithrombotics/antiplatelets: Secondary | ICD-10-CM

## 2011-03-24 DIAGNOSIS — I959 Hypotension, unspecified: Secondary | ICD-10-CM | POA: Diagnosis not present

## 2011-03-24 DIAGNOSIS — G4730 Sleep apnea, unspecified: Secondary | ICD-10-CM | POA: Diagnosis present

## 2011-03-24 DIAGNOSIS — D62 Acute posthemorrhagic anemia: Secondary | ICD-10-CM | POA: Diagnosis not present

## 2011-03-24 DIAGNOSIS — M7989 Other specified soft tissue disorders: Secondary | ICD-10-CM | POA: Diagnosis not present

## 2011-03-24 DIAGNOSIS — IMO0002 Reserved for concepts with insufficient information to code with codable children: Secondary | ICD-10-CM | POA: Diagnosis not present

## 2011-03-24 HISTORY — PX: PERCUTANEOUS CORONARY STENT INTERVENTION (PCI-S): SHX5485

## 2011-03-24 HISTORY — DX: Atherosclerotic heart disease of native coronary artery without angina pectoris: I25.10

## 2011-03-24 HISTORY — DX: Pure hypercholesterolemia, unspecified: E78.00

## 2011-03-24 HISTORY — DX: Essential (primary) hypertension: I10

## 2011-03-24 HISTORY — PX: LEFT HEART CATHETERIZATION WITH CORONARY ANGIOGRAM: SHX5451

## 2011-03-24 HISTORY — DX: Sleep apnea, unspecified: G47.30

## 2011-03-24 HISTORY — PX: CORONARY STENT PLACEMENT: SHX1402

## 2011-03-24 LAB — LIPID PANEL
HDL: 73 mg/dL (ref >39)
LDL Cholesterol: 85 mg/dL (ref 0–99)
Total CHOL/HDL Ratio: 2.5 RATIO
Triglycerides: 105 mg/dL (ref <150)

## 2011-03-24 LAB — POCT I-STAT TROPONIN I: Troponin i, poc: 0.02 ng/mL (ref 0.00–0.08)

## 2011-03-24 LAB — CARDIAC PANEL(CRET KIN+CKTOT+MB+TROPI)
CK, MB: 41.9 ng/mL (ref 0.3–4.0)
Relative Index: 3.7 — ABNORMAL HIGH (ref 0.0–2.5)
Relative Index: 9.2 — ABNORMAL HIGH (ref 0.0–2.5)
Total CK: 457 U/L — ABNORMAL HIGH (ref 7–177)
Troponin I: <0.3 ng/mL (ref <0.30)
Troponin I: 15.54 ng/mL (ref <0.30)

## 2011-03-24 LAB — CBC
HCT: 39.3 % (ref 36.0–46.0)
Hemoglobin: 13.5 g/dL (ref 12.0–15.0)
MCHC: 34.4 g/dL (ref 30.0–36.0)
WBC: 7 10*3/uL (ref 4.0–10.5)

## 2011-03-24 LAB — POCT I-STAT, CHEM 8
BUN: 12 mg/dL (ref 6–23)
Chloride: 105 mEq/L (ref 96–112)
Creatinine, Ser: 0.7 mg/dL (ref 0.50–1.10)
Potassium: 3.4 mEq/L — ABNORMAL LOW (ref 3.5–5.1)
Sodium: 141 mEq/L (ref 135–145)
TCO2: 27 mmol/L (ref 0–100)

## 2011-03-24 LAB — DIFFERENTIAL
Basophils Absolute: 0 10*3/uL (ref 0.0–0.1)
Basophils Relative: 0 % (ref 0–1)
Lymphocytes Relative: 29 % (ref 12–46)
Monocytes Relative: 7 % (ref 3–12)
Neutro Abs: 4.3 10*3/uL (ref 1.7–7.7)
Neutrophils Relative %: 61 % (ref 43–77)

## 2011-03-24 LAB — BASIC METABOLIC PANEL
Chloride: 105 mEq/L (ref 96–112)
GFR calc non Af Amer: 86 mL/min — ABNORMAL LOW (ref >90)
Sodium: 141 mEq/L (ref 135–145)

## 2011-03-24 LAB — PROTIME-INR: INR: 1.03 (ref 0.00–1.49)

## 2011-03-24 LAB — APTT: aPTT: 28 seconds (ref 24–37)

## 2011-03-24 SURGERY — LEFT HEART CATHETERIZATION WITH CORONARY ANGIOGRAM
Anesthesia: LOCAL

## 2011-03-24 MED ORDER — SODIUM CHLORIDE 0.9 % IV SOLN
1.0000 mL/kg/h | INTRAVENOUS | Status: AC
  Administered 2011-03-24: 93 mL/kg/h via INTRAVENOUS

## 2011-03-24 MED ORDER — CLOPIDOGREL BISULFATE 300 MG PO TABS
ORAL_TABLET | ORAL | Status: AC
  Administered 2011-03-25: 75 mg via ORAL
  Filled 2011-03-24: qty 2

## 2011-03-24 MED ORDER — EPTIFIBATIDE 75 MG/100ML IV SOLN
INTRAVENOUS | Status: AC
  Filled 2011-03-24: qty 100

## 2011-03-24 MED ORDER — EPTIFIBATIDE BOLUS VIA INFUSION: 180.0000 ug/kg | Freq: Once | INTRAVENOUS | Status: DC

## 2011-03-24 MED ORDER — PANTOPRAZOLE SODIUM 40 MG PO TBEC
40.0000 mg | DELAYED_RELEASE_TABLET | Freq: Every day | ORAL | Status: DC
  Administered 2011-03-25 – 2011-03-29 (×3): 40 mg via ORAL
  Filled 2011-03-24 (×2): qty 1

## 2011-03-24 MED ORDER — CLOPIDOGREL BISULFATE 75 MG PO TABS
75.0000 mg | ORAL_TABLET | Freq: Every day | ORAL | Status: DC
  Administered 2011-03-25 – 2011-03-29 (×5): 75 mg via ORAL
  Filled 2011-03-24 (×6): qty 1

## 2011-03-24 MED ORDER — FUROSEMIDE 40 MG PO TABS: 40.0000 mg | ORAL_TABLET | Freq: Every day | ORAL | Status: DC

## 2011-03-24 MED ORDER — NITROGLYCERIN 0.2 MG/ML ON CALL CATH LAB
INTRAVENOUS | Status: AC
  Filled 2011-03-24: qty 1

## 2011-03-24 MED ORDER — EPTIFIBATIDE 75 MG/100ML IV SOLN
2.0000 ug/kg/min | INTRAVENOUS | Status: DC
  Administered 2011-03-24 (×3): 2 ug/kg/min via INTRAVENOUS
  Filled 2011-03-24 (×3): qty 100

## 2011-03-24 MED ORDER — FENTANYL CITRATE 0.05 MG/ML IJ SOLN
INTRAMUSCULAR | Status: AC
  Filled 2011-03-24: qty 2

## 2011-03-24 MED ORDER — POTASSIUM CHLORIDE CRYS ER 20 MEQ PO TBCR: 40.0000 meq | EXTENDED_RELEASE_TABLET | Freq: Once | ORAL | Status: DC

## 2011-03-24 MED ORDER — ZOLPIDEM TARTRATE 5 MG PO TABS: 5.0000 mg | ORAL_TABLET | Freq: Every evening | ORAL | Status: DC | PRN

## 2011-03-24 MED ORDER — OLMESARTAN MEDOXOMIL 40 MG PO TABS
40.0000 mg | ORAL_TABLET | Freq: Every day | ORAL | Status: DC
  Administered 2011-03-25 – 2011-03-29 (×5): 40 mg via ORAL
  Filled 2011-03-24 (×6): qty 1

## 2011-03-24 MED ORDER — LIDOCAINE HCL (PF) 1 % IJ SOLN
INTRAMUSCULAR | Status: AC
  Filled 2011-03-24: qty 30

## 2011-03-24 MED ORDER — ALPRAZOLAM 0.25 MG PO TABS: 0.2500 mg | ORAL_TABLET | Freq: Two times a day (BID) | ORAL | Status: DC | PRN

## 2011-03-24 MED ORDER — FAMOTIDINE IN NACL 20-0.9 MG/50ML-% IV SOLN
INTRAVENOUS | Status: AC
  Administered 2011-03-25: 20 mg via INTRAVENOUS
  Filled 2011-03-24: qty 50

## 2011-03-24 MED ORDER — ASPIRIN EC 325 MG PO TBEC
325.0000 mg | DELAYED_RELEASE_TABLET | Freq: Every day | ORAL | Status: DC
  Administered 2011-03-25 – 2011-03-29 (×7): 325 mg via ORAL
  Filled 2011-03-24 (×6): qty 1

## 2011-03-24 MED ORDER — SIMVASTATIN 40 MG PO TABS
40.0000 mg | ORAL_TABLET | Freq: Every day | ORAL | Status: DC
  Administered 2011-03-25 – 2011-03-29 (×5): 40 mg via ORAL
  Filled 2011-03-24 (×6): qty 1

## 2011-03-24 MED ORDER — HEPARIN SODIUM (PORCINE) 1000 UNIT/ML IJ SOLN
INTRAMUSCULAR | Status: AC
  Filled 2011-03-24: qty 1

## 2011-03-24 MED ORDER — METOPROLOL TARTRATE 25 MG PO TABS
25.0000 mg | ORAL_TABLET | Freq: Two times a day (BID) | ORAL | Status: DC
  Administered 2011-03-24 – 2011-03-29 (×9): 25 mg via ORAL
  Filled 2011-03-24 (×9): qty 1

## 2011-03-24 MED ORDER — HEPARIN (PORCINE) IN NACL 2-0.9 UNIT/ML-% IJ SOLN
INTRAMUSCULAR | Status: AC
  Filled 2011-03-24: qty 2000

## 2011-03-24 MED ORDER — ONDANSETRON HCL 4 MG/2ML IJ SOLN: 4.0000 mg | Freq: Four times a day (QID) | INTRAMUSCULAR | Status: DC | PRN

## 2011-03-24 MED ORDER — ACETAMINOPHEN 325 MG PO TABS: 650.0000 mg | ORAL_TABLET | ORAL | Status: DC | PRN

## 2011-03-24 MED ORDER — SODIUM CHLORIDE 0.9 % IV BOLUS (SEPSIS)
500.0000 mL | Freq: Once | INTRAVENOUS | Status: AC
  Administered 2011-03-24: 500 mL via INTRAVENOUS

## 2011-03-24 MED ORDER — VERAPAMIL HCL 2.5 MG/ML IV SOLN
INTRAVENOUS | Status: AC
  Filled 2011-03-24: qty 2

## 2011-03-24 MED ORDER — ATROPINE SULFATE 1 MG/ML IJ SOLN
INTRAMUSCULAR | Status: AC
  Administered 2011-03-24: 5 mg via INTRAVENOUS
  Filled 2011-03-24: qty 1

## 2011-03-24 MED ORDER — MIDAZOLAM HCL 2 MG/2ML IJ SOLN
INTRAMUSCULAR | Status: AC
  Filled 2011-03-24: qty 2

## 2011-03-24 NOTE — Progress Notes (Signed)
ANTICOAGULATION CONSULT NOTE - Initial Consult  Pharmacy Consult for Integrilin x 18hr post cath stop  1100 11/2 Indication: chest pain/ACS - STEMI    Allergies  Allergen Reactions  . Codeine     Back hurts  . Neomycin   . Penicillins Hives    Patient Measurements: Height: 5' 2"  (157.5 cm) Weight: 205 lb 0.4 oz (93 kg) IBW/kg (Calculated) : 50.1   Vital Signs: Temp: 97.7 F (36.5 C) (11/11 1441) Temp src: Oral (11/11 1441) BP: 152/71 mmHg (11/11 1732) Pulse Rate: 55  (11/11 1732)  Labs:  Basename 03/24/11 1500 03/24/11 1458  HGB 13.5 13.9  HCT 39.3 41.0  PLT 237 --  APTT 28 --  LABPROT 13.7 --  INR 1.03 --  HEPARINUNFRC -- --  CREATININE 0.72 0.70  CKTOTAL 126 --  CKMB 4.7* --  TROPONINI <0.30 --   Estimated Creatinine Clearance: 71.5 ml/min (by C-G formula based on Cr of 0.72).  Medical History: Past Medical History  Diagnosis Date  . Hypertension   . High cholesterol   . Sleep apnea   . Coronary artery disease     Medications:  Prescriptions prior to admission  Medication Sig Dispense Refill  . furosemide (LASIX) 40 MG tablet Take 40 mg by mouth daily.       Marland Kitchen olmesartan (BENICAR) 40 MG tablet Take 40 mg by mouth daily.        Marland Kitchen omeprazole (PRILOSEC) 20 MG capsule Take 20 mg by mouth daily.        . pravastatin (PRAVACHOL) 40 MG tablet Take 40 mg by mouth daily.        Marland Kitchen DISCONTD: aspirin 81 MG tablet Take 81 mg by mouth daily.        Marland Kitchen DISCONTD: diltiazem (CARDIZEM CD) 300 MG 24 hr capsule Take 300 mg by mouth daily.          Assessment: Pt admitted for CP - STEMI with balloon to distal RCA.  Dissection of proximal RCA with DES  - still hematoma/clot burden at case end.  Plavix 660m load, Angiomax used during case turned at end and Integrilin single bolus and drip started x18hr post cath.  Asa,clop,metop,olmesartan,furosemide  ? Statin?      Plan:  Integrilin bolus 1837m/kg x1 then drip 2m73mkg/min x18hr Check 6hr CBC  CurNadine Counts/03/2011,6:03 PM

## 2011-03-24 NOTE — ED Notes (Signed)
Dr [name] is here and on way to ed.and cath lab is here. md aware

## 2011-03-24 NOTE — Op Note (Signed)
PROCEDURE:  Left heart catheterization with selective coronary angiography, left ventriculogram, Drug-eluting stent implantation proximal RCA with PTCA distal RCA the  INDICATIONS:  Acute inferior ST elevation myocardial infarction resolving spontaneously upon arrival in the emergency room   PROCEDURE TECHNIQUE:  After Xylocaine anesthesia a 6 French sheath was placed in the right radial artery. We were unable to advance into the brachial artery due to a radial recurrent vessel. Due to timing issues we decided to abort this approach. We then placed a 6 French  sheath  in the right femoral artery with a single anterior needle wall stick.   Coronary angiography was done using a 6 [place] #4 left Judkins catheter.  Left ventriculography was not done.  A 6 French Judkins right guide catheter  was used to obtain shots of the right coronary artery. After reviewing the images we determine of the distal right coronary was the target for the patient's presenting symptoms. The entire distal segment appeared relatively small and raise the question of a distal dissection.Angiomax bolus and infusion was given. A BMW wire was then advanced down the right coronary with little difficulty. We did notice that the proximal right coronary was deeply intubated with the guide catheter. We dilated the distal right ventricular branch with a 2.5 x 12 mm long balloon with a reasonable result. After removing the guidewire we noticed that there was an ostial right coronary dissection. We quickly reentered the vessel with the guidewire. We deployed a 4.0 x 18 mm Promus drug-eluting stent from the ostium into the proximal segment to seal the dissection. We post dilated with a 4.5 mm followed by followed by a 5.0 mm [place] balloon. We upgraded to be 5.0 mm balloon after intravascular ultrasound demonstrated that the medics midsegment of the right coronary was 5 mm in diameter.   CONTRAST:  Total of 200 cc.  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    HEMODYNAMICS:  Aortic pressure was 134/68 mmHg; LV pressure was 138/16 mmHg; LVEDP 24 mmHg.  There was no gradient between the left ventricle and aorta.    ANGIOGRAPHIC DATA:   The left main coronary artery is widely patent and free of disease.  The left anterior descending artery is ectatic wrapping around the left ventricular apex. One large diagonal arises from bed..  The left circumflex artery is moderate in size giving origin to 2 large obtuse marginal branches are free of disease..  The right coronary artery is widely patent proximal vessel bifurcating into a PDA and a large left ventricular branch. The left ventricular branch appearance is unusual in that the diameter of the proximal portion of the left ventricle branch is much smaller than that of the distal vessel raising the question of spontaneous dissection. The junction between this narrowed region and the more normal appearing distal vessel contains a 99% stenosis that we felt represented the culprit lesion  Percutaneous coronary intervention results:as noted above the distal right coronary was dilated with a 2.25 mm x 12 mm balloon. 2 inflations were performed. The 99% stenosis was reduced to less than 30%.  After dilatation the result was felt to be quite except for with TIMI grade 3 flow noted.  Following intervention on the distal right coronary we identified an ostial right coronary dissection. The dissection was treated with a drug-eluting Promus stent as noted above with a very nice final result and TIMI grade 3 flow noted.  Intravascular ultrasound was performed prior to the final  balloon inflation after demonstrating that the distal vessel was 5 mm in diameter.Marland Kitchen  LEFT VENTRICULOGRAM: not performed  IMPRESSIONS:  1. Acute inferior ST elevation myocardial infarction possibly due to spontaneous right coronary dissection involving the distal vessel.  2 Successful  angioplasty of the focal high-grade stenosis in the left ventricular branch of the right coronary from 99% to less than 30%.  3. Successful drug-eluting stent implantation of the ostium of the right coronary as treatment of either a spontaneous heart catheter-induced ostial dissection.  4. Left ventriculography will be assessed by echocardiography.   RECOMMENDATION:    1. Risk factor modification.  2. Single bolus Integrilin followed by an 18 hour infusion will be given  3. Plavix 75 mg daily Altace 600 mg load and does.  4. Beta blocker therapy, statin therapy, cardiac rehabilitation, and education will be initiated.

## 2011-03-24 NOTE — Progress Notes (Signed)
[facility] [place] HISTORY AND PHYSICAL   History of Present Illness:  Patient is a 70 y.o. year old female who presents for evaluation of hematoma right groin.  Sheath was removed approximately 90 min ago.  She immediately developed a hematoma.  She had an MI and stenting earlier today and was on Integrilin which is now off.  BP has been in the 30'Q systolic.  Subjectively she does not have pain in the groin and is fairly comfortable.  Other medical problems include hypertension, hypercholesterol, sleep apnea.  She is obese and is also on Plavix.  Past Medical History  Diagnosis Date  . Hypertension   . High cholesterol   . Sleep apnea   . Coronary artery disease     Past Surgical History  Procedure Date  . Joint replacement      Social History History  Substance Use Topics  . Smoking status: Never Smoker   . Smokeless tobacco: Not on file  . Alcohol Use: No    Family History History reviewed. No pertinent family history.  Allergies  Allergies  Allergen Reactions  . Codeine     Back hurts  . Neomycin   . Penicillins Hives     Current Facility-Administered Medications  Medication Dose Route Frequency Provider Last Rate Last Dose  . 0.9 %  sodium chloride infusion  1 mL/kg/hr Intravenous Continuous Belva Crome III 6,578.4 mL/hr at 03/24/11 1820 93 mL/kg/hr at 03/24/11 1820  . acetaminophen (TYLENOL) tablet 650 mg  650 mg Oral Q4H PRN Belva Crome III      . acetaminophen (TYLENOL) tablet 650 mg  650 mg Oral Q4H PRN Belva Crome III      . ALPRAZolam Duanne Moron) tablet 0.25 mg  0.25 mg Oral BID PRN Belva Crome III      . aspirin EC tablet 325 mg  325 mg Oral Daily Belva Crome III      . atropine 1 MG/ML injection           . clopidogrel (PLAVIX) 300 MG tablet           . clopidogrel (PLAVIX) tablet 75 mg  75 mg Oral Q breakfast Belva Crome III      . eptifibatide (INTEGRILIN) 75 mg / 100 mL infusion           . famotidine (PEPCID) 20-0.9  MG/50ML-% IVPB           . fentaNYL (SUBLIMAZE) 0.05 MG/ML injection           . furosemide (LASIX) tablet 40 mg  40 mg Oral Daily Belva Crome III      . heparin 1000 UNIT/ML injection           . heparin 2-0.9 UNIT/ML-% infusion           . lidocaine (XYLOCAINE) 1 % injection           . metoprolol tartrate (LOPRESSOR) tablet 25 mg  25 mg Oral BID Belva Crome III   25 mg at 03/24/11 2114  . midazolam (VERSED) 2 MG/2ML injection           . nitroGLYCERIN (NTG ON-CALL) 0.2 mg/mL injection           . olmesartan (BENICAR) tablet 40 mg  40 mg Oral Daily Lynnell Dike Smith III      . ondansetron [facility]) injection 4 mg  4 mg Intravenous Q6H PRN Belva Crome III      .  pantoprazole (PROTONIX) EC tablet 40 mg  40 mg Oral Q1200 Belva Crome III      . potassium chloride SA (K-DUR,KLOR-CON) CR tablet 40 mEq  40 mEq Oral Once Belva Crome III      . simvastatin (ZOCOR) tablet 40 mg  40 mg Oral q1800 Belva Crome III      . sodium chloride 0.9 % bolus 500 mL  500 mL Intravenous Once W Doristine Church., MD      . verapamil (ISOPTIN) 2.5 MG/ML injection           . zolpidem (AMBIEN) tablet 5 mg  5 mg Oral QHS PRN Belva Crome III      . DISCONTD: eptifibatide (INTEGRILIN) 0.75 mg/mL bolus via infusion 16,700 mcg  180 mcg/kg Intravenous Once Nadine Counts, [place]: eptifibatide (INTEGRILIN) 75 mg / 100 mL infusion           . DISCONTD: eptifibatide (INTEGRILIN) 75 mg / 100 mL infusion  2 mcg/kg/min Intravenous Continuous Nadine Counts, PHARMD 14.9 mL/hr at 03/24/11 2105 2 mcg/kg/min at 03/24/11 2105    ROS:   General:  No weight loss, Fever, chills  Cardiac: No recent episodes of chest pain/pressure, no shortness of breath  Vascular: No history of rest pain in feet.  No history of claudication.    Pulmonary: No home oxygen, no productive cough, no hemoptysis,  No asthma or wheezing  Musculoskeletal:  [x ] Arthritis, [ ]  Low back pain,  [x ] Joint  pain  Gastrointestinal: No hematochezia or melena,  No gastroesophageal reflux, no trouble swallowing  Urinary: no chronic Kidney disease,   Skin: No rashes, hematoma right groin  Psychological: No history of anxiety,  No history of depression   Physical Examination  Filed Vitals:   03/24/11 1458 03/24/11 1732 03/24/11 2000 03/24/11 2114  BP:  152/71  131/65  Pulse: 61 55  60  Temp:   98.1 F (36.7 C)   TempSrc:      Resp:  17    Height:  5' 2"  (1.575 m)    Weight:  205 lb 0.4 oz (93 kg)    SpO2:  100%      Body mass index is 37.50 kg/(m^2).  General:  Alert and oriented, no acute distress HEENT: Normal Abdomen: Soft, non-tender, non-distended, no mass, no scars Skin: No rash, hematoma right groin below inguinal ligament approx 10x 12 cm compressible, non tender with some mild shiny skin changes, ecchymosis of skin in similar size Extremity Pulses:  2+  Femoral, doppler  dorsalis pedis, posterior tibial pulses bilaterally Musculoskeletal: No deformity or edema  Neurologic: Upper and lower extremity motor 5/5 and symmetric   ASSESSMENT: Hematoma right groin, possible pseudoaneurysm, with labile BP   PLAN:  Receiving fluid bolus now Check stat CBC Will get CT abd/pelvis to rule out retroperitoneal component May need evacuation of hematoma regardless due to skin compromise  Plan d/w Dr. Wynonia Lawman who is on call for Cardiology tonight  Ruta Hinds, MD [facility] [place] Office: [phone] Pager: [phone]

## 2011-03-24 NOTE — Progress Notes (Signed)
CRITICAL VALUE ALERT  Critical value received:  Results for Isabella [name], Isabella [name] (MRN [id number]) as of 03/24/2011 18:55 Results for Isabella [name], Isabella [name] (MRN [id number]) as of 03/24/2011 18:55  Ref. Range 03/24/2011 17:33  Troponin I Latest Range: <0.30 ng/mL 15.54 (HH)    Ref. Range 03/24/2011 17:33  CK, MB    Latest Range: 0.3-4.0 ng/mL 41.9 [facility])    Date of notification:  03-25-2011   Time of notification:  3167  Critical value read back:yes  Nurse who received alert:  Jennet Maduro   MD notified (1st page): [name], MD  Time of first page:  [phone]  MD notified (2nd page):  Time of second page:  Responding MD:  [name]  Time MD responded:  [phone]

## 2011-03-24 NOTE — ED Notes (Signed)
MD at bedside. 

## 2011-03-24 NOTE — ED Provider Notes (Signed)
History     CSN: [id number] Arrival date & time: 03/24/2011  2:35 PM   First MD Initiated Contact with Patient 03/24/11 1446      Chief Complaint  Patient presents with  . Code STEMI    (Consider location/radiation/quality/duration/timing/severity/associated sxs/prior treatment) The history is provided by the patient and the EMS personnel.   Patient presents with acute onset of substernal chest pain which started possibly 1-2 hours prior to arrival. Patient was cooking a meal when she had heaviness and pressure which started in the middle of her chest radiated to both arms associated with her becoming short of breath and some nausea. Slight diaphoresis noted, no near-syncope or palpitations. She called EMS, took aspirin. Upon EMS arrival, patient's EKG suspicious for Stemi . Patient given nitroglycerin by EMS with improvement of her pain. Patient has no prior history of coronary artery disease No past medical history on file.  No past surgical history on file.  No family history on file.  History  Substance Use Topics  . Smoking status: Not on file  . Smokeless tobacco: Not on file  . Alcohol Use: Not on file    OB History    Grav Para Term Preterm Abortions TAB SAB Ect Mult Living                  Review of Systems  All other systems reviewed and are negative.    Allergies  Codeine; Neomycin; and Penicillins  Home Medications  No current outpatient prescriptions on file.  BP 147/67  Pulse 63  Temp(Src) 97.7 F (36.5 C) (Oral)  Ht 5' 2"  (1.575 m)  Wt 206 lb (93.441 kg)  BMI 37.68 kg/m2  SpO2 100%  Physical Exam  Nursing note and vitals reviewed. Constitutional: She is oriented to person, place, and time. Vital signs are normal. She appears well-developed and well-nourished.  Non-toxic appearance. No distress.  HENT:  Head: Normocephalic and atraumatic.  Eyes: Conjunctivae and EOM are normal. Pupils are equal, round, and reactive to light.  Neck: Normal  range of motion. Neck supple. No tracheal deviation present.  Cardiovascular: Normal rate, regular rhythm and normal heart sounds.  Exam reveals no gallop.   No murmur heard. Pulmonary/Chest: Effort normal and breath sounds normal. No stridor. No respiratory distress. She has no wheezes.  Abdominal: Soft. Normal appearance and bowel sounds are normal. She exhibits no distension. There is no tenderness. There is no rebound.  Musculoskeletal: Normal range of motion. She exhibits no edema and no tenderness.  Neurological: She is alert and oriented to person, place, and time. She has normal strength. No cranial nerve deficit or sensory deficit. GCS eye subscore is 4. GCS verbal subscore is 5. GCS motor subscore is 6.  Skin: Skin is warm and dry.  Psychiatric: She has a normal mood and affect. Her speech is normal and behavior is normal.    ED Course  Procedures (including critical care time)  Labs Reviewed - No data to display No results found.   No diagnosis found.    MDM   Date: 03/24/2011  Rate: 60  Rhythm: normal sinus rhythm  QRS Axis: normal  Intervals: normal  ST/T Wave abnormalities: ST depressions anteriorly  Conduction Disutrbances:none  Narrative Interpretation:   Old EKG Reviewed: none available  Patient was seen by cardiology here after her repeat EKG showed persistent ST depression anteriorly. She was given heparin per pharmacy and will be taken to the cardiac catheterization lab.  Leota Jacobsen, MD 03/24/11 [phone]

## 2011-03-24 NOTE — H&P (Signed)
Admit date: 03/24/2011 Referring Physician: Dr. Carol Ada Primary Cardiologist: Dr. Daneen Schick Chief complaint/reason for admission: acute inferior ST elevation myocardial infarction  HPI: The patient is a 70 year old who suddenly developed chest discomfort around 1:30 in the afternoon while washing dishes after lunch. She described the discomfort as a pressure-like sensation that she had never had before. The discomfort persisted and she was brought to the emergency room. The EKG in route revealed ST segment elevation. The elevation was in 2,3, and aVF and there are was reciprocal V1 through V3 ST-T wave abnormality. After arriving in the emergency room the patient's discomfort gradually began to abate as did ST elevation.    PMH:    Past Medical History  Diagnosis Date  . Hypertension   . High cholesterol   . Sleep apnea     PSH:    Past Surgical History  Procedure Date  . Joint replacement     ALLERGIES:   Codeine; Neomycin; and Penicillins  Prior to Admit Meds:   Prescriptions prior to admission  Medication Sig Dispense Refill  . furosemide (LASIX) 40 MG tablet Take 40 mg by mouth daily.        Marland Kitchen olmesartan (BENICAR) 40 MG tablet Take 40 mg by mouth daily.        Marland Kitchen omeprazole (PRILOSEC) 20 MG capsule Take 20 mg by mouth daily.        . pravastatin (PRAVACHOL) 40 MG tablet Take 40 mg by mouth daily.        Marland Kitchen DISCONTD: aspirin 81 MG tablet Take 81 mg by mouth daily.        Marland Kitchen DISCONTD: diltiazem (CARDIZEM CD) 300 MG 24 hr capsule Take 300 mg by mouth daily.         Family HX:   History reviewed. No pertinent family history. Social HX:    History   Social History  . Marital Status: Married    Spouse Name: N/A    Number of Children: N/A  . Years of Education: N/A   Occupational History  . Not on file.   Social History Main Topics  . Smoking status: Never Smoker   . Smokeless tobacco: Not on file  . Alcohol Use: No  . Drug Use: No  . Sexually Active:    Other  Topics Concern  . Not on file   Social History Narrative  . No narrative on file     Review of systems: The patient denies prior heart history. No history of stroke, heart failure, diabetes, kidney disease, GI bleeding, surgery requiring medication cessation, or other significant complaints.  Physical Exam: Blood pressure 147/67, pulse 61, temperature 97.7 F (36.5 C), temperature source Oral, height 5' 2"  (1.575 m), weight 93.441 kg (206 lb), SpO2 100.00%.    Physical exam:  On exam the patient was comfortable lying relatively flat on the gurney. Her blood pressure was normal. Skin was dry. The HEENT exam was unremarkable. Neck exam revealed no JVD or carotid bruits. The lungs were clear to auscultation and percussion. Cardiac exam revealed no gallop, rub, or murmur. The patient has an obese abdomen. There is no tenderness. No bruits are heard. The extremities reveal 2+ femoral pulses 1+ posterior tibial pulses and 2+ radials bilaterally. Neurologically the patient is intact.   Labs:   Lab Results  Component Value Date   WBC 7.0 03/24/2011   HGB 13.5 03/24/2011   HCT 39.3 03/24/2011   MCV 89.1 03/24/2011   PLT 237 03/24/2011  Lab 03/24/11 1500  NA 141  K 3.4*  CL 105  CO2 24  BUN 11  CREATININE 0.72  CALCIUM 9.1  PROT --  BILITOT --  ALKPHOS --  ALT --  AST --  GLUCOSE 163*   Lab Results  Component Value Date   CKTOTAL 126 03/24/2011   CKMB 4.7* 03/24/2011   TROPONINI <0.30 03/24/2011   No results found for this basename: PTT   Lab Results  Component Value Date   INR 1.03 03/24/2011   INR 1.04 07/24/2010        EKG:   in the emergency room reveal 1/2-1 mm of ST elevation in lead 2, 3 and aVF.  ASSESSMENT AND PLAN:   1. Aborted acute inferior ST elevation myocardial infarction. The patient continues to have mild residual chest discomfort despite resolution of ST elevation.  2. Hyperlipidemia  3. Long-standing history of hypertension  4.  Obesity.    After evaluating the patient and discussing our treatment options we decided to go straight to the catheter lab to define coronary anatomy. The nature of the procedure and its attendant risks were discussed in detail with the patient in the presence of her husband. Risks including stroke, heart attack, death, emergency bypass surgery, bleeding,allergy, renal failure, among others were discussed and accepted by the patient. Sinclair Grooms 03/24/2011 5:12 PM

## 2011-03-24 NOTE — ED Notes (Signed)
Labs given to [place] with Phlebotomy

## 2011-03-24 NOTE — ED Notes (Signed)
Pt complains of pressure and heaviness in chest, pt given asa 324 mg po and 3 ntg prior to arrival.

## 2011-03-24 NOTE — ED Notes (Signed)
Family at bedside. 

## 2011-03-24 NOTE — ED Notes (Signed)
Patient blood drawn by Darrell from right ac.

## 2011-03-24 NOTE — ED Notes (Signed)
Cath lab aware and ready.

## 2011-03-24 NOTE — ED Notes (Signed)
Pt here byems for chest pain 1 hour ago while doing dishes turned around sudden onset, 5/10 with increase at times, radiating to left arm.

## 2011-03-24 NOTE — H&P (Signed)
Date of Initial H&P: see full note that follows this procedure.  History reviewed, patient examined, no change in status, stable for surgery.

## 2011-03-24 NOTE — Significant Event (Addendum)
Nurse called me to see the patient with difficulty with a large hematoma following sheath removal. Earlier this evening she had stenting of the right coronary artery proximally for a dissection and the sheath was removed when the ACT value was fine.  The nurse called me after about 35 minutes of holding pressure.  She currently has a large hematoma measuring about 5" x 3" which is firm below the inguinal ligament. She did not have any active bleeding currently and appeared stable. Her distal pulse is intact.  Impression: Large post PCI hematoma at the femoral site  Recommendations:  We will apply the FemoStop device. I will ask for the vascular technician to come evaluate the vascular site. Tech not available and spoke to Dr. Oneida Alar who will some evaluate the patient.   Ezzard Standing, Brooke Bonito. M.D. [place] with large hematoma appears firmer post Femstop applied.  Pulse dopplerable.   BP decreased to 80.  Given fluid bolus and and atropine 0.5 mg IV b/o HR 50.   BP improved to 90 post fluid bolus.  Integrilin was d/ced a few minutes ago.  11:45 pm BP still marginal.  Dr. Oneida Alar has seen.  Hematoma appears to be enlarging and tense.  Check STAT CBC  Type and screen .  After discussion patient will go to OR for evacuation and exploration.

## 2011-03-25 ENCOUNTER — Encounter (HOSPITAL_COMMUNITY): Payer: Self-pay | Admitting: Anesthesiology

## 2011-03-25 ENCOUNTER — Encounter (HOSPITAL_COMMUNITY): Payer: Self-pay

## 2011-03-25 ENCOUNTER — Inpatient Hospital Stay (HOSPITAL_COMMUNITY): Payer: Medicare Other | Admitting: Anesthesiology

## 2011-03-25 ENCOUNTER — Encounter (HOSPITAL_COMMUNITY)
Admission: EM | Disposition: A | Discharge status: Home Health | Payer: Self-pay | Source: Ambulatory Visit | Attending: Interventional Cardiology

## 2011-03-25 DIAGNOSIS — M7989 Other specified soft tissue disorders: Secondary | ICD-10-CM

## 2011-03-25 DIAGNOSIS — IMO0002 Reserved for concepts with insufficient information to code with codable children: Secondary | ICD-10-CM

## 2011-03-25 DIAGNOSIS — D62 Acute posthemorrhagic anemia: Secondary | ICD-10-CM | POA: Diagnosis not present

## 2011-03-25 HISTORY — PX: FEMORAL ARTERY EXPLORATION: SHX5160

## 2011-03-25 LAB — CBC
Hemoglobin: 8.5 g/dL — ABNORMAL LOW (ref 12.0–15.0)
Hemoglobin: 8.5 g/dL — ABNORMAL LOW (ref 12.0–15.0)
Hemoglobin: 9.4 g/dL — ABNORMAL LOW (ref 12.0–15.0)
MCH: 29 pg (ref 26.0–34.0)
MCH: 29.6 pg (ref 26.0–34.0)
MCHC: 33.1 g/dL (ref 30.0–36.0)
MCHC: 33.1 g/dL (ref 30.0–36.0)
MCHC: 33.3 g/dL (ref 30.0–36.0)
MCV: 86.9 fL (ref 78.0–100.0)
MCV: 90.3 fL (ref 78.0–100.0)
Platelets: 200 10*3/uL (ref 150–400)
Platelets: 168 10*3/uL (ref 150–400)
RBC: 2.83 MIL/uL — ABNORMAL LOW (ref 3.87–5.11)
RBC: 2.84 MIL/uL — ABNORMAL LOW (ref 3.87–5.11)
RBC: 3.18 MIL/uL — ABNORMAL LOW (ref 3.87–5.11)
RDW: 18.5 % — ABNORMAL HIGH (ref 11.5–15.5)
WBC: 10 10*3/uL (ref 4.0–10.5)
WBC: 12.8 10*3/uL — ABNORMAL HIGH (ref 4.0–10.5)
WBC: 9.7 10*3/uL (ref 4.0–10.5)

## 2011-03-25 LAB — CARDIAC PANEL(CRET KIN+CKTOT+MB+TROPI): Troponin I: 9.8 ng/mL (ref <0.30)

## 2011-03-25 LAB — BASIC METABOLIC PANEL
GFR calc non Af Amer: >90 mL/min (ref >90)
Glucose, Bld: 121 mg/dL — ABNORMAL HIGH (ref 70–99)
Potassium: 3.7 mEq/L (ref 3.5–5.1)
Sodium: 142 mEq/L (ref 135–145)

## 2011-03-25 LAB — PREPARE RBC (CROSSMATCH)

## 2011-03-25 LAB — POCT I-STAT 4, (NA,K, GLUC, HGB,HCT)
HCT: 26 % — ABNORMAL LOW (ref 36.0–46.0)
Hemoglobin: 8.8 g/dL — ABNORMAL LOW (ref 12.0–15.0)
Sodium: 142 mEq/L (ref 135–145)

## 2011-03-25 LAB — ABO/RH: ABO/RH(D): A POS

## 2011-03-25 LAB — TSH: TSH: 1.25 u[IU]/mL (ref 0.350–4.500)

## 2011-03-25 LAB — MRSA PCR SCREENING: MRSA by PCR: NEGATIVE

## 2011-03-25 SURGERY — EXPLORATION, ARTERY, FEMORAL
Anesthesia: General | Site: Groin | Laterality: Right | Wound class: Clean

## 2011-03-25 MED ORDER — PROMETHAZINE HCL 25 MG/ML IJ SOLN: 6.2500 mg | INTRAMUSCULAR | Status: DC | PRN

## 2011-03-25 MED ORDER — DOCUSATE SODIUM 100 MG PO CAPS
100.0000 mg | ORAL_CAPSULE | Freq: Every day | ORAL | Status: DC
  Administered 2011-03-26 – 2011-03-29 (×4): 100 mg via ORAL
  Filled 2011-03-25 (×4): qty 1

## 2011-03-25 MED ORDER — ALUM & MAG HYDROXIDE-SIMETH 200-200-20 MG/5ML PO SUSP: 15.0000 mL | ORAL | Status: DC | PRN

## 2011-03-25 MED ORDER — MORPHINE SULFATE 2 MG/ML IJ SOLN: 2.0000 mg | INTRAMUSCULAR | Status: DC | PRN

## 2011-03-25 MED ORDER — VANCOMYCIN HCL IN DEXTROSE 1-5 GM/200ML-% IV SOLN
1000.0000 mg | Freq: Once | INTRAVENOUS | Status: DC
  Filled 2011-03-25: qty 200

## 2011-03-25 MED ORDER — HYDRALAZINE HCL 20 MG/ML IJ SOLN: 10.0000 mg | INTRAMUSCULAR | Status: DC | PRN

## 2011-03-25 MED ORDER — SODIUM CHLORIDE 0.9 % IV SOLN
INTRAVENOUS | Status: DC | PRN
  Administered 2011-03-25 (×2): via INTRAVENOUS

## 2011-03-25 MED ORDER — ALBUMIN HUMAN 5 % IV SOLN
INTRAVENOUS | Status: AC
  Administered 2011-03-25: 12.5 g via INTRAVENOUS
  Filled 2011-03-25: qty 250

## 2011-03-25 MED ORDER — SODIUM CHLORIDE 0.9 % IV SOLN
500.0000 mL | Freq: Once | INTRAVENOUS | Status: AC | PRN
  Administered 2011-03-25: 500 mL via INTRAVENOUS

## 2011-03-25 MED ORDER — POLYETHYLENE GLYCOL 3350 17 G PO PACK
17.0000 g | PACK | Freq: Every day | ORAL | Status: DC | PRN
  Filled 2011-03-25: qty 1

## 2011-03-25 MED ORDER — ETOMIDATE 2 MG/ML IV SOLN
INTRAVENOUS | Status: DC | PRN
  Administered 2011-03-25: 22 mg via INTRAVENOUS

## 2011-03-25 MED ORDER — BISACODYL 10 MG RE SUPP: 10.0000 mg | Freq: Every day | RECTAL | Status: DC | PRN

## 2011-03-25 MED ORDER — POTASSIUM CHLORIDE CRYS ER 20 MEQ PO TBCR: 20.0000 meq | EXTENDED_RELEASE_TABLET | Freq: Once | ORAL | Status: AC | PRN

## 2011-03-25 MED ORDER — FAMOTIDINE IN NACL 20-0.9 MG/50ML-% IV SOLN
20.0000 mg | Freq: Two times a day (BID) | INTRAVENOUS | Status: DC
  Administered 2011-03-25 – 2011-03-26 (×3): 20 mg via INTRAVENOUS
  Filled 2011-03-25 (×3): qty 50

## 2011-03-25 MED ORDER — SODIUM CHLORIDE 0.9 % IV SOLN: INTRAVENOUS | Status: DC

## 2011-03-25 MED ORDER — SODIUM CHLORIDE 0.9 % IV BOLUS (SEPSIS): 500.0000 mL | Freq: Once | INTRAVENOUS | Status: DC

## 2011-03-25 MED ORDER — PHENYLEPHRINE HCL 10 MG/ML IJ SOLN
10000.0000 ug | INTRAVENOUS | Status: DC | PRN
  Administered 2011-03-25: 50 ug/min via INTRAVENOUS

## 2011-03-25 MED ORDER — SUCCINYLCHOLINE CHLORIDE 20 MG/ML IJ SOLN
INTRAMUSCULAR | Status: DC | PRN
  Administered 2011-03-25: 100 mg via INTRAVENOUS

## 2011-03-25 MED ORDER — ONDANSETRON HCL 4 MG/2ML IJ SOLN: 4.0000 mg | Freq: Four times a day (QID) | INTRAMUSCULAR | Status: DC | PRN

## 2011-03-25 MED ORDER — SODIUM CHLORIDE 0.9 % IJ SOLN
3.0000 mL | Freq: Two times a day (BID) | INTRAMUSCULAR | Status: DC
  Administered 2011-03-26 – 2011-03-29 (×8): 3 mL via INTRAVENOUS

## 2011-03-25 MED ORDER — ONDANSETRON HCL 4 MG/2ML IJ SOLN
INTRAMUSCULAR | Status: DC | PRN
  Administered 2011-03-25: 4 mg via INTRAVENOUS

## 2011-03-25 MED ORDER — GUAIFENESIN-DM 100-10 MG/5ML PO SYRP: 15.0000 mL | ORAL_SOLUTION | ORAL | Status: DC | PRN

## 2011-03-25 MED ORDER — MEPERIDINE HCL 25 MG/ML IJ SOLN: 6.2500 mg | INTRAMUSCULAR | Status: DC | PRN

## 2011-03-25 MED ORDER — ACETAMINOPHEN 650 MG RE SUPP: 325.0000 mg | RECTAL | Status: DC | PRN

## 2011-03-25 MED ORDER — VANCOMYCIN HCL IN DEXTROSE 1-5 GM/200ML-% IV SOLN
1000.0000 mg | Freq: Two times a day (BID) | INTRAVENOUS | Status: AC
  Administered 2011-03-25 (×2): 1000 mg via INTRAVENOUS
  Filled 2011-03-25 (×2): qty 200

## 2011-03-25 MED ORDER — MAGNESIUM SULFATE 50 % IJ SOLN
2.0000 g | Freq: Once | INTRAVENOUS | Status: AC | PRN
  Filled 2011-03-25: qty 4

## 2011-03-25 MED ORDER — PHENOL 1.4 % MT LIQD
1.0000 | OROMUCOSAL | Status: DC | PRN
  Filled 2011-03-25: qty 177

## 2011-03-25 MED ORDER — METOPROLOL TARTRATE 1 MG/ML IV SOLN: 2.0000 mg | INTRAVENOUS | Status: DC | PRN

## 2011-03-25 MED ORDER — LABETALOL HCL 5 MG/ML IV SOLN: 10.0000 mg | INTRAVENOUS | Status: DC | PRN

## 2011-03-25 MED ORDER — ALBUMIN HUMAN 5 % IV SOLN
12.5000 g | INTRAVENOUS | Status: AC
  Administered 2011-03-25: 12.5 g via INTRAVENOUS

## 2011-03-25 MED ORDER — ROCURONIUM BROMIDE 100 MG/10ML IV SOLN
INTRAVENOUS | Status: DC | PRN
  Administered 2011-03-25: 25 mg via INTRAVENOUS

## 2011-03-25 MED ORDER — MAGNESIUM HYDROXIDE 400 MG/5ML PO SUSP: 30.0000 mL | ORAL | Status: DC | PRN

## 2011-03-25 MED ORDER — SODIUM CHLORIDE 0.9 % IR SOLN
Status: DC | PRN
  Administered 2011-03-25: 1000 mL

## 2011-03-25 MED ORDER — OXYCODONE HCL 5 MG PO TABS: 5.0000 mg | ORAL_TABLET | ORAL | Status: DC | PRN

## 2011-03-25 MED ORDER — MIDAZOLAM HCL 2 MG/2ML IJ SOLN: 0.5000 mg | Freq: Once | INTRAMUSCULAR | Status: DC | PRN

## 2011-03-25 MED ORDER — EPHEDRINE SULFATE 50 MG/ML IJ SOLN
INTRAMUSCULAR | Status: DC | PRN
  Administered 2011-03-25: 10 mg via INTRAVENOUS

## 2011-03-25 MED ORDER — ACETAMINOPHEN 325 MG PO TABS: 325.0000 mg | ORAL_TABLET | ORAL | Status: DC | PRN

## 2011-03-25 MED ORDER — FENTANYL CITRATE 0.05 MG/ML IJ SOLN
INTRAMUSCULAR | Status: DC | PRN
  Administered 2011-03-25: 62.5 ug via INTRAVENOUS

## 2011-03-25 MED ORDER — GLYCOPYRROLATE 0.2 MG/ML IJ SOLN
INTRAMUSCULAR | Status: DC | PRN
  Administered 2011-03-25: .9 mg via INTRAVENOUS

## 2011-03-25 MED ORDER — NEOSTIGMINE METHYLSULFATE 1 MG/ML IJ SOLN
INTRAMUSCULAR | Status: DC | PRN
  Administered 2011-03-25: 5 mg via INTRAVENOUS

## 2011-03-25 MED FILL — Sodium Chloride IV Soln 0.9%: INTRAVENOUS | Qty: 50 | Status: AC

## 2011-03-25 MED FILL — Bivalirudin Trifluoroacetate For IV Soln 250 MG (Base Equiv): INTRAVENOUS | Qty: 250 | Status: AC

## 2011-03-25 SURGICAL SUPPLY — 33 items
CANISTER SUCTION 2500CC (MISCELLANEOUS) ×1 IMPLANT
CLOTH BEACON ORANGE TIMEOUT ST (SAFETY) ×1 IMPLANT
COVER SURGICAL LIGHT HANDLE (MISCELLANEOUS) ×2 IMPLANT
DRAIN JACKSON PRATT 10MM FLAT (MISCELLANEOUS) ×2 IMPLANT
DRAPE WARM FLUID 44X44 (DRAPE) ×1 IMPLANT
ELECT REM PT RETURN 9FT ADLT (ELECTROSURGICAL) ×2
ELECTRODE REM PT RTRN 9FT ADLT (ELECTROSURGICAL) IMPLANT
EVACUATOR SILICONE 100CC (DRAIN) ×2 IMPLANT
GLOVE BIO SURGEON STRL SZ7.5 (GLOVE) ×1 IMPLANT
GLOVE BIOGEL PI IND STRL 6.5 (GLOVE) IMPLANT
GLOVE BIOGEL PI INDICATOR 6.5 (GLOVE) ×4
GLOVE ECLIPSE 6.0 STRL STRAW (GLOVE) ×1 IMPLANT
GLOVE ECLIPSE 7.0 STRL STRAW (GLOVE) ×1 IMPLANT
GOWN PREVENTION PLUS XLARGE (GOWN DISPOSABLE) ×1 IMPLANT
GOWN STRL NON-REIN LRG LVL3 (GOWN DISPOSABLE) ×2 IMPLANT
KIT BASIN OR (CUSTOM PROCEDURE TRAY) ×1 IMPLANT
NS IRRIG 1000ML POUR BTL (IV SOLUTION) ×1 IMPLANT
PAD ARMBOARD 7.5X6 YLW CONV (MISCELLANEOUS) ×1 IMPLANT
SPONGE GAUZE 4X4 STERILE 39 (GAUZE/BANDAGES/DRESSINGS) ×1 IMPLANT
SPONGE LAP 18X18 X RAY DECT (DISPOSABLE) ×2 IMPLANT
STAPLER VISISTAT 35W (STAPLE) ×1 IMPLANT
SUT ETHILON 3 0 PS 1 (SUTURE) ×2 IMPLANT
SUT PROLENE 5 0 C 1 24 (SUTURE) ×1 IMPLANT
SUT PROLENE 6 0 CC (SUTURE) ×1 IMPLANT
SUT VIC AB 2-0 CTX 36 (SUTURE) ×1 IMPLANT
SUT VIC AB 3-0 SH 27 (SUTURE) ×2
SUT VIC AB 3-0 SH 27X BRD (SUTURE) IMPLANT
TAPE CLOTH SURG 6X10 WHT LF (GAUZE/BANDAGES/DRESSINGS) ×1 IMPLANT
TOWEL OR 17X24 6PK STRL BLUE (TOWEL DISPOSABLE) ×3 IMPLANT
TRAY FOLEY CATH 14FR (SET/KITS/TRAYS/PACK) ×1 IMPLANT
UNDERPAD 30X30 INCONTINENT (UNDERPADS AND DIAPERS) ×1 IMPLANT
WATER STERILE IRR 1000ML POUR (IV SOLUTION) ×1 IMPLANT
YANKAUER SUCT BULB TIP NO VENT (SUCTIONS) ×1 IMPLANT

## 2011-03-25 NOTE — Transfer of Care (Signed)
Immediate Anesthesia Transfer of Care Note  Patient: Isabella [name] [facility]  Procedure(s) Performed:  FEMORAL ARTERY EXPLORATION  Patient Location: PACU  Anesthesia Type: General  Level of Consciousness: awake, alert , oriented and patient cooperative  Airway & Oxygen Therapy: Patient connected to nasal cannula oxygen  Post-op Assessment: Report given to PACU RN, Post -op Vital signs reviewed and stable and Patient moving all extremities X 4  Post vital signs: Reviewed and stable  Complications: No apparent anesthesia complications

## 2011-03-25 NOTE — Anesthesia Preprocedure Evaluation (Signed)
Anesthesia Evaluation  Patient identified by MRN, date of birth, ID band Patient awake    Airway Mallampati: II      Dental  (+) Dental Advidsory Given and Teeth Intact   Pulmonary sleep apnea ,  clear to auscultation        Cardiovascular hypertension, + CAD and + Past MI regular     Neuro/Psych    GI/Hepatic   Endo/Other    Renal/GU      Musculoskeletal   Abdominal   Peds  Hematology   Anesthesia Other Findings   Reproductive/Obstetrics                           Anesthesia Physical Anesthesia Plan  ASA: IV and Emergent  Anesthesia Plan: General   Post-op Pain Management:    Induction: Intravenous  Airway Management Planned: Oral ETT  Additional Equipment:   Intra-op Plan:   Post-operative Plan: Possible Post-op intubation/ventilation  Informed Consent: I have reviewed the patients History and Physical, chart, labs and discussed the procedure including the risks, benefits and alternatives for the proposed anesthesia with the patient or authorized representative who has indicated his/her understanding and acceptance.   Dental advisory given  Plan Discussed with:   Anesthesia Plan Comments:         Anesthesia Quick Evaluation

## 2011-03-25 NOTE — Progress Notes (Signed)
Post-op antibiotic doses reviewed for renal function; dose ordered appropriate for current renal function.  Pharmacy will sign off.  Thank you.   Wynona Neat, PharmD, BCPS 03/25/2011 6:03 AM

## 2011-03-25 NOTE — Op Note (Signed)
Procedure: Evacuation of hematoma right groin  Preoperative diagnosis: Hematoma right groin after cardiac catheterization  Postoperative diagnosis: Same  Anesthesia: Gen.  Assistant: Remo Lipps RNFA  Operative findings: Large hematoma right groin approximately 1000 mL of blood   2 5-0 Prolene sutures placed in the common femoral artery just above the bifurcation  Drains: One JP drain in the subcutaneous tissue outer right thigh, one JP drain right groin in the deep layers  Indications: Patient is status post cardiac catheterization earlier today. She has developed an expansile hematoma in the right groin with skin compromise and severe pain. She's taken to the operating room for evacuation of the hematoma and exploration of her right femoral artery.  Operative details: After obtaining informed consent, the patient was taken to the operating room. The patient was placed in supine position operating table. After induction of general anesthesia, the patient's entire right lower extremity was prepped and draped in usual sterile fashion. A longitudinal incision was then made in the right groin extending from the level of the inguinal ligament down over the hematoma. After entering the subcutaneous tissues a large amount of hematoma was evacuated. This is approximate 1 L total. All hematoma contents were removed and the wound thoroughly irrigated with normal saline solution. Dissection was then carried down to level of the common femoral artery. There were 2 areas on the anterior surface of the femoral artery that appeared to be the needlestick site. There was no active bleeding. A single 5-0 Prolene suture was placed in these 2 separate areas. The hematoma cavity extended all the down over the distal superficial femoral artery 7 cm. No other needle stick site or active bleeding was encountered. All hematoma contents was completely evacuated. The profunda femoris was examined on its anterior  surface over the first couple centimeters and there was no active bleeding from this as well. At this point again the wound was thoroughly irrigated normal saline solution. Two 10 flat Jackson-Pratt drains were brought on the operative field. One was placed in the hematoma cavity in the outer portion of the right groin. An additional was placed in the hematoma cavity on the more medial portion of the thigh and laid over the top of the artery. Deep layers and groin were then closed with running 2-0 Vicryl suture. This was followed by 3-0 Vicryl running suture. The skin was closed with staples. The drains were sutured to the skin with 3-0 nylon suture. The patient tolerated the procedure well and there were no complications. Instrument sponge and needle counts were correct at the end of the case. Patient was taken the recovery room in stable condition.

## 2011-03-25 NOTE — OR Nursing (Signed)
Late entry @0939  for procedure performed

## 2011-03-25 NOTE — Anesthesia Postprocedure Evaluation (Signed)
  Anesthesia Post-op Note  Patient: Isabella [name]  Procedure(s) Performed:  FEMORAL ARTERY EXPLORATION  Patient Location: PACU  Anesthesia Type: General  Level of Consciousness: awake  Airway and Oxygen Therapy: Patient Spontanous Breathing  Post-op Pain: mild  Post-op Assessment: Post-op Vital signs reviewed  Post-op Vital Signs: stable  Complications: No apparent anesthesia complications

## 2011-03-25 NOTE — Progress Notes (Signed)
Pt in with Stemi, s/p cath with stent placement. Pt is form home with Husband. Hx: Htn, sleep apnea and is on plavix for home. Pt did develop r groin hematoma after sheath pulled and evacuation performed; jp drains intact. CM spoke to pt and husband and she may benefit from [facility] RN @ d/c. CM relayed to the pt that she will be f/u on until d/c to see if needed. She does have a personal home cpap that she purchased from [facility]. CM will continue to f/u for any additional d/c needs. Bethena Roys

## 2011-03-25 NOTE — Progress Notes (Signed)
Right Femoral sheath was d/c by Loma Boston RN under the usually procedure of pulling an arterial sheath. Within minutes of pulling sheath, the right femoral groin began forming a large hematoma in which pressure was held above and below the puncture sites. Pt.s heart rate and rhythm stayed in SR /SB high 50's and low 60's. SBP began in the low 100's and then dropped to 80's with pt.remaining warm and dry. Dr.Tilley was called after 30 minutes of holding pressure and notified of pt.'s situation. Dr.Tilley came to room within minutes to assess the situation. Family, pt.s son was notified of the events.Pt. Was given a fluid bolus of 500cc NS and fluids was increased to 150cc/hr as well as 1/2 amp. Of atropine was given.  Pt.eventually was taken to the OR by Dr.Fields for evacuation of hematoma to right groin.

## 2011-03-25 NOTE — Progress Notes (Signed)
  Echocardiogram 2D Echocardiogram has been performed.  [name], Isabella [name] 03/25/2011, 12:17 PM

## 2011-03-25 NOTE — Progress Notes (Signed)
Subjective:  Unfortunately the patient had a long night after she left the catheterization laboratory. Because of the coronary dissection I decided to use Integrilin therapy in addition to Plavix. From the onset of sheath removal the patient developed a hematoma that was not controlled well by the staff and resulted in a large hematoma that he eventually needed to have surgical evacuation. He feels perform this procedure earlier this morning.  This morning the patient is asymptomatic with reference to chest pain. There is mild right groin soreness. She denies dyspnea.  Objective:  Vital Signs in the last 24 hours: Temp:  [96.5 F (35.8 C)-98.1 F (36.7 C)] 97.4 F (36.3 C) (11/12 0800) Pulse Rate:  [52-72] 67  (11/12 0800) Resp:  [14-23] 18  (11/12 0800) BP: (78-152)/(32-71) 99/48 mmHg (11/12 0800) SpO2:  [97 %-100 %] 100 % (11/12 0800) Arterial Line BP: (138-145)/(65-69) 138/65 mmHg (11/11 2035) Weight:  [93 kg (205 lb 0.4 oz)-100.4 kg (221 lb 5.5 oz)] 221 lb 5.5 oz (100.4 kg) (11/12 0450)  Intake/Output from previous day: 11/11 0701 - 11/12 0700 In: 3250 [I.V.:3000; IV Piggyback:250] Out: 1895 [Urine:710; Drains:185; Blood:1000] Intake/Output from this shift:    Physical Exam: The patient appears somewhat pale. Cardiac exam is unremarkable. The lungs are clear to auscultation and percussion. Right groin has drains. Right radial catheter site is unremarkable. The neurological exam is unremarkable  Lab Results:  Basename 03/25/11 0250 03/25/11 0114 03/24/11 1500  WBC 12.8* -- 7.0  HGB 8.5* 8.8* --  PLT 174 -- 237    Basename 03/25/11 0114 03/24/11 1500 03/24/11 1458  NA 142 141 --  K 3.8 3.4* --  CL -- 105 105  CO2 -- 24 --  GLUCOSE 155* 163* --  BUN -- 11 12  CREATININE -- 0.72 0.70    Basename 03/24/11 1733 03/24/11 1500  TROPONINI 15.54* <0.30   Hepatic Function Panel No results found for this basename: PROT,ALBUMIN,AST,ALT,ALKPHOS,BILITOT,BILIDIR,IBILI in the  last 72 hours  Basename 03/24/11 1742  CHOL 179      Assessment/Plan:  1. 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 To this point there've been no recurrent ischemic events. I will continue Plavix and aspirin therapy.  2. Significant bleeding of the right femoral site that occurred at the sheath pull. This is compensated by the antiplatelet therapy and antithrombotic therapy that was used during the procedure.  3. Significant anemia secondary to bleeding from the catheter site. I will type and cross the patient and transfuse to hemoglobin of 10 since the patient is status post acute myocardial infarction.    LOS: 1 day    Sinclair Grooms 03/25/2011, 8:31 AM

## 2011-03-25 NOTE — Progress Notes (Signed)
[facility] [place]  Subjective  - POD #0 from evacuation hematoma feels better but sore  Objective 106/40 101 98 F (36.7 C) (Oral) 18 99%  Intake/Output Summary (Last 24 hours) at 03/25/11 1624 Last data filed at 03/25/11 1300  Gross per 24 hour  Intake   3610 ml  Output   2205 ml  Net   1405 ml    Ecchymosis in groin  But soft drains minimal  Assessment/Planning: No active bleeding hopefully drains out next one to two days, ok to walk  FIELDS,CHARLES E 03/25/2011 4:24 PM --  Laboratory Lab Results:  [facility] 03/25/11 0930 03/25/11 0250  WBC 10.2 12.8*  HGB 8.5* 8.5*  HCT 25.7* 25.7*  PLT 200 174   BMET  Basename 03/25/11 0930 03/25/11 0114 03/24/11 1500  NA 142 142 --  K 3.7 3.8 --  CL 113* -- 105  CO2 24 -- 24  GLUCOSE 121* 155* --  BUN 9 -- 11  CREATININE 0.60 -- 0.72  CALCIUM 7.2* -- 9.1    COAG Lab Results  Component Value Date   INR 1.03 03/24/2011   INR 1.04 07/24/2010   No results found for this basename: PTT    Antibiotics Anti-infectives     Start     Dose/Rate Route Frequency Ordered Stop   03/25/11 0600   vancomycin (VANCOCIN) IVPB 1000 mg/200 mL premix        1,000 mg 200 mL/hr over 60 Minutes Intravenous Every 12 hours 03/25/11 0504 03/26/11 0559   03/25/11 0030   vancomycin (VANCOCIN) IVPB 1000 mg/200 mL premix  Status:  Discontinued        1,000 mg 200 mL/hr over 60 Minutes Intravenous  Once 03/25/11 0025 03/25/11 0427

## 2011-03-26 LAB — CARDIAC PANEL(CRET KIN+CKTOT+MB+TROPI)
CK, MB: 13.8 ng/mL (ref 0.3–4.0)
Total CK: 352 U/L — ABNORMAL HIGH (ref 7–177)

## 2011-03-26 MED ORDER — FAMOTIDINE 20 MG PO TABS
20.0000 mg | ORAL_TABLET | Freq: Two times a day (BID) | ORAL | Status: DC
  Administered 2011-03-26 – 2011-03-29 (×6): 20 mg via ORAL
  Filled 2011-03-26 (×9): qty 1

## 2011-03-26 NOTE — Progress Notes (Signed)
Subjective:  The patient denies episodes of chest pain. There is still mild to moderate right groin discomfort. She is now greater than 24 hours status post evacuation of a large hematoma that developed at the time of sheath pull 36 hours ago. She does not have any other complaints than fatigue.  Objective:  Vital Signs in the last 24 hours: Temp:  [97.4 F (36.3 C)-99.2 F (37.3 C)] 98.5 F (36.9 C) (11/13 0347) Pulse Rate:  [59-101] 68  (11/13 0400) Resp:  [15-24] 20  (11/13 0400) BP: (80-109)/(32-67) 99/46 mmHg (11/13 0400) SpO2:  [95 %-100 %] 99 % (11/13 0400)  Intake/Output from previous day: 11/12 0701 - 11/13 0700 In: 410 [I.V.:10; Blood:350; IV Piggyback:50] Out: 1430 [Urine:1250; Drains:180] Intake/Output from this shift: Total I/O In: 50 [IV Piggyback:50] Out: 890 [Urine:800; Drains:90]  Physical Exam: The patient is somewhat pale. Cardiac exam is normal. Lungs are clear. Several drains extent from the right femoral area.  Lab Results:  Basename 03/25/11 1940 03/25/11 0930  WBC 9.7 10.2  HGB 9.1* 8.5*  PLT 168 200    Basename 03/25/11 0930 03/25/11 0114 03/24/11 1500  NA 142 142 --  K 3.7 3.8 --  CL 113* -- 105  CO2 24 -- 24  GLUCOSE 121* 155* --  BUN 9 -- 11  CREATININE 0.60 -- 0.72    Basename 03/26/11 0119 03/25/11 1745  TROPONINI 6.14* 9.80*    Cardiac Studies:  Assessment  1. Acute inferior myocardial infarction successfully treated with angioplasty and stenting upon arrival.  2. Complication of antithrombotic therapy including a massive right groin hematoma that developed at the time of sheath removal that ultimately required surgical evacuation.  3. Sleep apnea being treated with CPAP.  4. Relative hypotension related to vying contraction.  5. Anemia with hemoglobin of 9.1 after one unit of blood on 03/25/2011  Plan:  1. Will continue to give IV fluids to help with pressure support.  2. When possible we will start DVT prophylaxis with  subcutaneous Lovenox.  3. Ambulate as tolerated.  4. Continue statin therapy, aspirin, Plavix, and reinstitute beta blocker therapy when feasible based upon her hemodynamics.  5. Continue to follow hemoglobin levels and transfuse to keep hemoglobin 9 or above. Sinclair Grooms 03/26/2011, 6:44 AM

## 2011-03-26 NOTE — Progress Notes (Signed)
CARDIAC REHAB PHASE I   PRE:  Rate/Rhythm: 70 SR  BP:  Supine:   Sitting: 92/66  Standing:    SaO2: 100% RA  MODE:  Ambulation: 250 ft   POST:  Rate/Rhythem: 94 SR  BP:  Supine:   Sitting: 119/56  Standing:    SaO2: 100 % RA 1020 - 1050 Pt ambulated with rw and assist x 2. Several rest breaks for "winded". Tolerate well. States feels better walking the more she does. Pt back to chair with feet elevated. Pt given MI book and diet to read.   Isabella [name]

## 2011-03-26 NOTE — Progress Notes (Addendum)
[facility] Progress Note  03/26/2011 10:32 AM POD 1  Getting ready to walk with cardiac rehab.  Filed Vitals:   03/26/11 0800  BP: 101/50  Pulse:   Temp: 98.5 F (36.9 C)  Resp:     Incisions:  Covered, bandage clean. No hematoma Extremities:  + palpable right DP pulse; drains in place  CBC    Component Value Date/Time   WBC 9.7 03/25/2011 1940   RBC 3.14* 03/25/2011 1940   HGB 9.1* 03/25/2011 1940   HCT 27.3* 03/25/2011 1940   PLT 168 03/25/2011 1940   MCV 86.9 03/25/2011 1940   MCH 29.0 03/25/2011 1940   MCHC 33.3 03/25/2011 1940   RDW 18.5* 03/25/2011 1940   LYMPHSABS 2.0 03/24/2011 1500   MONOABS 0.5 03/24/2011 1500   EOSABS 0.2 03/24/2011 1500   BASOSABS 0.0 03/24/2011 1500    BMET    Component Value Date/Time   NA 142 03/25/2011 0930   K 3.7 03/25/2011 0930   CL 113* 03/25/2011 0930   CO2 24 03/25/2011 0930   GLUCOSE 121* 03/25/2011 0930   BUN 9 03/25/2011 0930   CREATININE 0.60 03/25/2011 0930   CALCIUM 7.2* 03/25/2011 0930   GFRNONAA >90 03/25/2011 0930   GFRAA >90 03/25/2011 0930    INR    Component Value Date/Time   INR 1.03 03/24/2011 1500     Intake/Output Summary (Last 24 hours) at 03/26/11 1032 Last data filed at 03/26/11 0900  Gross per 24 hour  Intake    650 ml  Output   1816 ml  Net  -1166 ml   Drain #1 153cc Drain #2 113cc (Bloody, serous fluid in JP's)   Assessment/Plan:  70 y.o. female is s/p Evacuation of hematoma right groin  POD 1  Will leave drains today.  Continue to record output of drains. Continue ambulation and mobilization. Re-evaluate possibility of removal of drains tomorrow.   Evorn Gong, PA-C [facility] [phone] 03/26/2011 10:32 AM  History and exam as above.  Will need drains less than 90 ml per day prior to removal.  Continue current care.  Ruta Hinds, MD [facility] [place] Office: [phone] Pager:  [phone]

## 2011-03-27 DIAGNOSIS — Z48812 Encounter for surgical aftercare following surgery on the circulatory system: Secondary | ICD-10-CM

## 2011-03-27 LAB — CARDIAC PANEL(CRET KIN+CKTOT+MB+TROPI): Total CK: 125 U/L (ref 7–177)

## 2011-03-27 LAB — CBC
MCH: 30 pg (ref 26.0–34.0)
MCHC: 34.1 g/dL (ref 30.0–36.0)
MCV: 88.1 fL (ref 78.0–100.0)
Platelets: 147 10*3/uL — ABNORMAL LOW (ref 150–400)
RBC: 2.6 MIL/uL — ABNORMAL LOW (ref 3.87–5.11)
RDW: 18.1 % — ABNORMAL HIGH (ref 11.5–15.5)

## 2011-03-27 LAB — DIFFERENTIAL
Basophils Absolute: 0 10*3/uL (ref 0.0–0.1)
Basophils Relative: 0 % (ref 0–1)
Eosinophils Absolute: 0.2 10*3/uL (ref 0.0–0.7)
Eosinophils Relative: 3 % (ref 0–5)
Neutrophils Relative %: 78 % — ABNORMAL HIGH (ref 43–77)

## 2011-03-27 MED ORDER — DOCUSATE SODIUM 100 MG PO CAPS
100.0000 mg | ORAL_CAPSULE | Freq: Two times a day (BID) | ORAL | Status: DC | PRN
  Filled 2011-03-27: qty 1

## 2011-03-27 NOTE — Progress Notes (Signed)
ABI completed at 16:00.  Preliminary report is >1.0 bilaterally. Isabella [name] 03/27/2011, 4:15 PM

## 2011-03-27 NOTE — Progress Notes (Signed)
CARDIAC REHAB PHASE I   PRE:  Rate/Rhythm: 72SR  BP:  Supine:   Sitting: 100/83  Standing:    SaO2: 100%RA  MODE:  Ambulation: 350 ft   POST:  Rate/Rhythem: 99SR  BP:  Supine:   Sitting: 128/57  Standing:    SaO2: 99%RA  Pt walked 350 ft with rolling walker and minimal asst. Tolerated well. No CP. Tired by end of walk. To chair with call bell. MI ed completed except ex. Permission given to refer to [facility] Phase 2. [id number]  Jeani Sow

## 2011-03-27 NOTE — Progress Notes (Signed)
Patient Name: Isabella [name] Date of Encounter: 03/27/2011    SUBJECTIVE: The patient has had no recurrence of chest pain. The right groin soreness is improving. Kate Sable this several times around the CCU yesterday. She denies dyspnea.  TELEMETRY:  No arrhythmias.: Filed Vitals:   03/27/11 0300 03/27/11 0400 03/27/11 0500 03/27/11 0600  BP: 117/48 114/44 116/42 121/48  Pulse:      Temp:  97.5 F (36.4 C)    TempSrc:  Axillary    Resp:      Height:      Weight:      SpO2: 99% 99% 100% 100%    Intake/Output Summary (Last 24 hours) at 03/27/11 0846 Last data filed at 03/27/11 0700  Gross per 24 hour  Intake    923 ml  Output    959 ml  Net    -36 ml    LABS: Basic Metabolic Panel:  Basename 03/25/11 0930 03/25/11 0114 03/24/11 1500  NA 142 142 --  K 3.7 3.8 --  CL 113* -- 105  CO2 24 -- 24  GLUCOSE 121* 155* --  BUN 9 -- 11  CREATININE 0.60 -- 0.72  CALCIUM 7.2* -- 9.1  MG -- -- --  PHOS -- -- --   CBC:  Basename 03/27/11 0543 03/25/11 1940 03/24/11 1500  WBC 7.1 9.7 --  NEUTROABS -- -- 4.3  HGB 7.8* 9.1* --  HCT 22.9* 27.3* --  MCV 88.1 86.9 --  PLT 147* 168 --   Cardiac Enzymes:  Basename 03/27/11 0543 03/26/11 0119 03/25/11 1745  CKTOTAL 125 352* 391*  CKMB 3.9 13.8* 24.8*  CKMBINDEX -- -- --  TROPONINI 2.51* 6.14* 9.80*   BNP: No results found for this basename: POCBNP:3 in the last 72 hours Hemoglobin A1C: No results found for this basename: HGBA1C in the last 72 hours Fasting Lipid Panel:  Basename 03/24/11 1742  CHOL 179  HDL 73  LDLCALC 85  TRIG 105  CHOLHDL 2.5  LDLDIRECT --    Radiology/Studies:  No new studies are noted  Physical Exam: Blood pressure 121/48, pulse 82, temperature 97.5 F (36.4 C), temperature source Axillary, resp. rate 12, height 5' 2"  (1.575 m), weight 100.4 kg (221 lb 5.5 oz), SpO2 100.00%. Weight change:    The patient appears slightly pale. No distress is noted. Lungs are clear. Cardiac exam reveals an S4.  Abdomen is soft. Drains are noted in the right femoral operative site. Serosanguineous  fluid is draining. Neuro exam is unremarkable.  ASSESSMENT:  1. Status post inferior myocardial infarction treated with angioplasty and DES on presentation.  2. Right groin hematoma as a complication of sheath removal ultimately requiring surgical evacuation.   Plan:  1. Transferred to telemetry  2. Continue to care for the right femoral site per vascular surgery service, Dr. Oneida Alar  3. Will give therapy for constipation.  4. Increase ambulation  Signed, Sinclair Grooms 03/27/2011, 8:46 AM

## 2011-03-27 NOTE — Progress Notes (Addendum)
[facility] Progress Note  03/27/2011 8:25 AM POD 2  Doing well.  Pt states walking got easier the 2nd time yesterday.  Being transferred to 2000.  Filed Vitals:   03/27/11 0600  BP: 121/48  Pulse:   Temp:   Resp:     Incisions:  C/d/i with staples in tact.  No drainage. Extremities:  RLE warm.  CBC    Component Value Date/Time   WBC 7.1 03/27/2011 0543   RBC 2.60* 03/27/2011 0543   HGB 7.8* 03/27/2011 0543   HCT 22.9* 03/27/2011 0543   PLT 147* 03/27/2011 0543   MCV 88.1 03/27/2011 0543   MCH 30.0 03/27/2011 0543   MCHC 34.1 03/27/2011 0543   RDW 18.1* 03/27/2011 0543   LYMPHSABS 2.0 03/24/2011 1500   MONOABS 0.5 03/24/2011 1500   EOSABS 0.2 03/24/2011 1500   BASOSABS 0.0 03/24/2011 1500    BMET    Component Value Date/Time   NA 142 03/25/2011 0930   K 3.7 03/25/2011 0930   CL 113* 03/25/2011 0930   CO2 24 03/25/2011 0930   GLUCOSE 121* 03/25/2011 0930   BUN 9 03/25/2011 0930   CREATININE 0.60 03/25/2011 0930   CALCIUM 7.2* 03/25/2011 0930   GFRNONAA >90 03/25/2011 0930   GFRAA >90 03/25/2011 0930    INR    Component Value Date/Time   INR 1.03 03/24/2011 1500     Intake/Output Summary (Last 24 hours) at 03/27/11 0825 Last data filed at 03/27/11 0700  Gross per 24 hour  Intake    923 ml  Output    959 ml  Net    -36 ml   Drain #1: 106 cc Drain #2: 153 cc  Assessment/Plan:  70 y.o. female is s/p Evacuation of hematoma right groin POD 2  -Drains still with increased drainage.  Needs to be less than 90 cc/24hr period before removal.  Therefore, will leave drains today.  Re-evaluate tomorrow for possible removal of drains. -Pt being transferred to 2000 by primary team. -Will continue to follow. -surgical blood loss anemia improved with one unit PRBCs  Evorn Gong, PA-C [facility] [phone] 03/27/2011 8:25 AM    Right groin ecchymosis but no hematoma. Incision healing. Need JP drains less  than 100 ml per day before removal. Cont current care.  Ruta Hinds, MD [facility] [place] Office: [phone] Pager: [phone]

## 2011-03-27 NOTE — Clinical Documentation Improvement (Signed)
Anemia Blood Loss Clarification  Dear Dr. Blenda Bridegroom, Isabella [name]  In an effort to better capture your patient's severity of illness, reflect appropriate length of stay and utilization of resources, a review of the patient medical record has revealed the following indicators.    Based on your clinical judgment, please clarify and document in a progress note and/or discharge summary the clinical condition associated with the following supporting information:  In responding to this query please exercise your independent judgment.  The fact that a query is asked, does not imply that any particular answer is desired or expected.  Possible Clinical Conditions?   " Expected Acute Blood Loss Anemia  " Acute Blood Loss Anemia  " Acute on chronic blood loss anemia  " Chronic blood loss anemia  " Precipitous drop in Hematocrit  " Other Condition________________  " Cannot Clinically Determine   Clinical Information:  Risk Factors:  S/P L heart cath w/selective coronary angiography Development of R groin hematoma post shealth removal "Significant anemia secondary to bleed from cath site" per progress note 11/12 s/p Evacuation of R groin hematoma--approx 1 Liter  Signs and Symptoms: Hematoma Pale and weak  Diagnostics: H&H  11/11@1458 =13.9/41.0 11/12@0114 =8.8/26.0 11/12@0250 =8.5/25.7 11/12@0930 =8.5/25.7 11/12@1940 =9.1/27.3 11/14@0543 =7.8/22.9  Treatments: Transfusion: 11/12=1 Unit PRBC & 11/14=1 Unit PRBC IV fluids / plasma expanders:  Frequent CBC monitoring Surgical intervention  Reviewed: additional documentation in the medical record   Thank You,  Sincerely, Estella Husk RN, MSN Clinical Documentation Specialist: Office# [phone]  Health Information Management [place]   Acute blood loss anemia due to groin bleed.

## 2011-03-27 NOTE — Progress Notes (Signed)
UR Completed.   Isabella [name] 03/27/2011

## 2011-03-28 LAB — CBC
MCV: 87.9 fL (ref 78.0–100.0)
Platelets: 181 10*3/uL (ref 150–400)
RDW: 17 % — ABNORMAL HIGH (ref 11.5–15.5)
WBC: 7.6 10*3/uL (ref 4.0–10.5)

## 2011-03-28 LAB — TYPE AND SCREEN
ABO/RH(D): A POS
Unit division: 0

## 2011-03-28 MED ORDER — FERROUS SULFATE 325 (65 FE) MG PO TABS
325.0000 mg | ORAL_TABLET | Freq: Three times a day (TID) | ORAL | Status: DC
  Administered 2011-03-28 – 2011-03-29 (×6): 325 mg via ORAL
  Filled 2011-03-28 (×7): qty 1

## 2011-03-28 NOTE — Progress Notes (Signed)
Patient Name: CHRISSI CROW Date of Encounter: 03/28/2011    SUBJECTIVE: She's had no chest pain. She is ambulating without difficulty. Still draining from the right femoral hematoma site. She's been seen by vascular surgery today. Neither of the 2 drains have been pulled.  She denies angina. She denies dyspnea. I gave an additional unit of blood yesterday because of a hemoglobin less than 7.5. I would not transfuse again the left side there is significant recurrent anemia. We will start some iron.  TELEMETRY:  No significant abnormality is noted: Filed Vitals:   03/27/11 1847 03/27/11 2100 03/28/11 0545 03/28/11 1057  BP: 129/85 117/75 112/71 137/82  Pulse: 82 82 75 75  Temp: 98.1 F (36.7 C) 98.6 F (37 C) 98.8 F (37.1 C)   TempSrc: Oral Oral Oral   Resp: 20 20 18    Height:      Weight:   100.4 kg (221 lb 5.5 oz)   SpO2: 98% 98% 96%     Intake/Output Summary (Last 24 hours) at 03/28/11 1101 Last data filed at 03/28/11 0900  Gross per 24 hour  Intake    740 ml  Output    901 ml  Net   -161 ml    LABS: Basic Metabolic Panel: No results found for this basename: NA:2,K:2,CL:2,CO2:2,GLUCOSE:2,BUN:2,CREATININE:2,CALCIUM:2,MG:2,PHOS:2 in the last 72 hours CBC:  Basename 03/28/11 0545 03/27/11 0937 03/27/11 0543  WBC 7.6 -- 7.1  NEUTROABS -- 6.2 --  HGB 9.0* -- 7.8*  HCT 26.8* -- 22.9*  MCV 87.9 -- 88.1  PLT 181 -- 147*   Cardiac Enzymes:  Basename 03/27/11 0543 03/26/11 0119 03/25/11 1745  CKTOTAL 125 352* 391*  CKMB 3.9 13.8* 24.8*  CKMBINDEX -- -- --  TROPONINI 2.51* 6.14* 9.80*   BNP: No results found for this basename: POCBNP:3 in the last 72 hours Hemoglobin A1C: No results found for this basename: HGBA1C in the last 72 hours Fasting Lipid Panel: No results found for this basename: CHOL,HDL,LDLCALC,TRIG,CHOLHDL,LDLDIRECT in the last 72 hours  Radiology/Studies:  No new study  Physical Exam: Blood pressure 137/82, pulse 75, temperature 98.8 F (37.1  C), temperature source Oral, resp. rate 18, height 5' 2"  (1.575 m), weight 100.4 kg (221 lb 5.5 oz), SpO2 96.00%. Weight change:    Patient is sitting visiting with family. Pallor is less than on yesterday. The lungs are clear. No murmur or gallop is heard. Abdomen is soft. The right groin is inspected. There is serosanguineous drainage in each two Jackson-Pratt drains. Ecchymosis is noted. No peripheral edema is noted.   ASSESSMENT:  1. Acute inferior ST elevation myocardial infarction without recurrent symptoms since PTCA and stenting of the right coronary on the day of admission.  2. Significant blood loss anemia requiring packed red blood cells x2 units.  3. Right groin hematoma resolved status post surgical drainage. The postsurgical wound and drains are being managed by vascular surgery.  4. Hypertension under adequate control blood with noted increase in blood pressure over the last several days.  Plan:  1. Continue ambulation.  2. Start iron therapy.  3. Once drains are pulled and she is felt ready for discharge by vascular surgery, Dr. Oneida Alar, we will allow her to go home .  Demetrios Isaacs 03/28/2011, 11:01 AM

## 2011-03-28 NOTE — Progress Notes (Signed)
Removed JP drain #1 per MD order per hospital protocol. Applied guaze to rt groin. Patient tolerated well. Will continue to monitor closely for bleeding. Jossie Ng, RN

## 2011-03-28 NOTE — Progress Notes (Addendum)
[facility] Progress Note  03/28/2011 7:56 AM POD 3  Doing well.  Soreness continues to improve with walking.  Concerned about area in the left anti cubital space where IV was.  Pt states that it has improved since yesterday.  Filed Vitals:   03/28/11 0545  BP: 112/71  Pulse: 75  Temp: 98.8 F (37.1 C)  Resp: 18    Incisions:  C/d/i -- no drainage.  Has ecchymosis around incision. Extremities:  RLE warm.  Hardened area at left anti cubital space.  No edema and no erythema.  CBC    Component Value Date/Time   WBC 7.6 03/28/2011 0545   RBC 3.05* 03/28/2011 0545   HGB 9.0* 03/28/2011 0545   HCT 26.8* 03/28/2011 0545   PLT 181 03/28/2011 0545   MCV 87.9 03/28/2011 0545   MCH 29.5 03/28/2011 0545   MCHC 33.6 03/28/2011 0545   RDW 17.0* 03/28/2011 0545   LYMPHSABS 1.0 03/27/2011 0937   MONOABS 0.5 03/27/2011 0937   EOSABS 0.2 03/27/2011 0937   BASOSABS 0.0 03/27/2011 0937    BMET    Component Value Date/Time   NA 142 03/25/2011 0930   K 3.7 03/25/2011 0930   CL 113* 03/25/2011 0930   CO2 24 03/25/2011 0930   GLUCOSE 121* 03/25/2011 0930   BUN 9 03/25/2011 0930   CREATININE 0.60 03/25/2011 0930   CALCIUM 7.2* 03/25/2011 0930   GFRNONAA >90 03/25/2011 0930   GFRAA >90 03/25/2011 0930    INR    Component Value Date/Time   INR 1.03 03/24/2011 1500     Intake/Output Summary (Last 24 hours) at 03/28/11 0756 Last data filed at 03/27/11 1959  Gross per 24 hour  Intake    500 ml  Output    901 ml  Net   -401 ml   Drain #1: 20 cc Drain#2   80 cc  Assessment/Plan:  70 y.o. female is s/p Evacuation of hematoma right groin  POD 3  Continues to improve each day.    Remove lowest output drain today since less than 100 cc per 24hr period.  Advised pt that she can shower starting on Saturday with soap and water, pat dry and keep covered with gauze to keep dry.  Advised her that she can clean the exit sites for the drains with hydrogen peroxide  and if they continue to drain, that she can cover with gauze.  We will see her back in 2 weeks to evaluate wound and remove staples.  Evorn Gong, PA-C [facility] [phone] 03/28/2011 7:56 AM   Will leave one drain in today.  D/C lower output today and higher output tomorrow.  Should be ready for d/c from my stdpt when last drain comes out tomorrow.  Ruta Hinds, MD [facility] [place] Office: [phone] Pager: [phone]

## 2011-03-28 NOTE — Progress Notes (Signed)
CARDIAC REHAB PHASE I   PRE:  Rate/Rhythm: 77SR  BP:  Supine:   Sitting:   Standing: 130/80   SaO2: 97%RA  MODE:  Ambulation: 550 ft   POST:  Rate/Rhythem: 101  BP:  Supine:   Sitting: 130/80  Standing:    SaO2: 100%RA  Tolerated walk well with rolling walker. No CP. Completed ex ed. To chair with call bell. [phone]  Jeani Sow

## 2011-03-29 ENCOUNTER — Encounter (HOSPITAL_COMMUNITY): Payer: Self-pay | Admitting: Vascular Surgery

## 2011-03-29 DIAGNOSIS — R600 Localized edema: Secondary | ICD-10-CM | POA: Diagnosis not present

## 2011-03-29 LAB — CBC
Hemoglobin: 8.8 g/dL — ABNORMAL LOW (ref 12.0–15.0)
MCHC: 33.5 g/dL (ref 30.0–36.0)
RBC: 2.97 MIL/uL — ABNORMAL LOW (ref 3.87–5.11)
WBC: 6.5 10*3/uL (ref 4.0–10.5)

## 2011-03-29 MED ORDER — ACETAMINOPHEN 325 MG PO TABS: 325.0000 mg | ORAL_TABLET | ORAL | Status: AC | PRN

## 2011-03-29 MED ORDER — ASPIRIN 325 MG PO TBEC: 325.0000 mg | DELAYED_RELEASE_TABLET | Freq: Every day | ORAL | Status: AC

## 2011-03-29 MED ORDER — FERROUS SULFATE 325 (65 FE) MG PO TABS: 325.0000 mg | ORAL_TABLET | Freq: Three times a day (TID) | ORAL | Status: DC

## 2011-03-29 MED ORDER — ENOXAPARIN SODIUM 40 MG/0.4ML ~~LOC~~ SOLN
40.0000 mg | SUBCUTANEOUS | Status: DC
  Filled 2011-03-29: qty 0.4

## 2011-03-29 MED ORDER — CLOPIDOGREL BISULFATE 75 MG PO TABS: 75.0000 mg | ORAL_TABLET | Freq: Every day | ORAL | Status: DC

## 2011-03-29 MED ORDER — NITROGLYCERIN 0.4 MG SL SUBL: 0.4000 mg | SUBLINGUAL_TABLET | SUBLINGUAL | Status: DC | PRN

## 2011-03-29 MED ORDER — METOPROLOL TARTRATE 25 MG PO TABS: 25.0000 mg | ORAL_TABLET | Freq: Two times a day (BID) | ORAL | Status: DC

## 2011-03-29 NOTE — Discharge Summary (Signed)
Patient ID: Isabella [name] MRN: [id number] DOB/AGE: Sep 23, 1940 70 y.o.  Admit date: 03/24/2011 Discharge date: 03/30/2011  Primary Discharge Diagnosis:  Acute inferior ST elevation myocardial infarction   Secondary Discharge Diagnosis: 1. Acute blood loss anemia  2. Right femoral/groin hematoma complicating sheath removal  3. Hypertension  4. Hyperlipidemia  Significant Diagnostic Studies: Coronary angiography with PCI including stenting of the proximal RCA and angioplasty of the distal PLO M. branch, on 03/24/2011  Consults: vascular surgery, Dr. Jamison Oka Course: The patient was brought to the emergency room after suddenly developing chest pain or and 03/24/2011. ST elevation was noted on the electrocardiogram and while in the emergency room began gradually resolving. Because of persisting discomfort she was brought to the catheterization laboratory where emergency coronary angiography was performed. Rapid access into the right radial was obtained I ever because of anatomic variance, radial loop, we aborted this approach and did the catheterization via the right femoral approach. Please refer to the catheterization report for details of the procedure. The patient's right coronary anatomy suggested the possibility of distal spontaneous dissection. Either spontaneous or catheter-induced ostial dissection occurred during the procedure and required ostial stenting. A very acceptable angiographic result was obtained. The patient received Angiomax, Plavix, and a discontinuation of Angiomax Integrilin was started.  After the ACT decreased to less than 150, the CCU staff removed the arterial sheath. Upon sheath removal she rapidly developed a right femoral cath site hematoma that cannot be controlled by the nursing staff. Dr. Wynonia Lawman he eventually saw the patient and consult to Dr. Oneida Alar. She was ultimately taken to the OR to evacuate a large hematoma.  On the he first day  following the hematoma evacuation significant anemia developed with a hemoglobin level less than 8.5. Over the next 48 hours she will receive a total of 2 units of packed red blood cells to maintain a hemoglobin greater than 9.0 in the setting of recent acute myocardial infarction.  By the third hospital day the patient was up and ambulating with minimal difficulty. She had no recurrences of chest pain after the angioplasty.  Fifth hospital day mild swelling was noted in the right lower extremity. DVT prophylaxis with is not apparent was started. Discharge is pending approval by the vascular is surgery service.   Discharge Exam: Blood pressure 103/60, pulse 76, temperature 97.5 F (36.4 C), temperature source Oral, resp. rate 19, height 5' 2"  (1.575 m), weight 100.9 kg (222 lb 7.1 oz), SpO2 95.00%.   The patient is in no distress. Lungs and cardiac exam are normal with exception of an S4 gallop. No jugular vein distention is noted. There is trace to 1+ right lower extremity edema. The right inguinal region has surgical clips. No drainage is noted from the incision of the right inguinal area. There is no warmth or erythema. Labs:  Lab Results  Component Value Date   WBC 7.6 03/28/2011   HGB 9.0* 03/28/2011   HCT 26.8* 03/28/2011   MCV 87.9 03/28/2011   PLT 181 03/28/2011    Lab 03/25/11 0930  NA 142  K 3.7  CL 113*  CO2 24  BUN 9  CREATININE 0.60  CALCIUM 7.2*  PROT --  BILITOT --  ALKPHOS --  ALT --  AST --  GLUCOSE 121*   Lab Results  Component Value Date   CKTOTAL 125 03/27/2011   CKMB 3.9 03/27/2011   TROPONINI 2.51* 03/27/2011    Lab Results  Component Value Date   CHOL 179  03/24/2011   Lab Results  Component Value Date   HDL 73 03/24/2011   Lab Results  Component Value Date   LDLCALC 85 03/24/2011   Lab Results  Component Value Date   TRIG 105 03/24/2011   Lab Results  Component Value Date   CHOLHDL 2.5 03/24/2011   No results found for this  basename: LDLDIRECT      Radiology: Chest x-ray did not reveal any acute abnormality EKG: Normal  FOLLOW UP PLANS AND APPOINTMENTS Discharge Orders    Future Appointments: Provider: Department: Dept Phone: Center:   04/11/2011 3:00 PM [facility] [phone] [facility]   04/30/2011 10:50 AM [facility] [phone] [facility]   06/19/2011 10:15 AM Kathee Delton, MD [facility] [phone] None     Current Discharge Medication List    START taking these medications   Details  acetaminophen (TYLENOL) 325 MG tablet Take 1-2 tablets (325-650 mg total) by mouth every 4 (four) hours as needed (or temp >/= 101 F). Qty: 30 tablet    aspirin EC 325 MG EC tablet Take 1 tablet (325 mg total) by mouth daily. Qty: 30 tablet, Refills: 11    clopidogrel (PLAVIX) 75 MG tablet Take 1 tablet (75 mg total) by mouth daily with breakfast. Qty: 30 tablet, Refills: 11    ferrous sulfate 325 (65 FE) MG tablet Take 1 tablet (325 mg total) by mouth 3 (three) times daily with meals. Qty: 90 tablet, Refills: 2    metoprolol tartrate (LOPRESSOR) 25 MG tablet Take 1 tablet (25 mg total) by mouth 2 (two) times daily. Qty: 60 tablet, Refills: 11      CONTINUE these medications which have NOT CHANGED   Details  furosemide (LASIX) 40 MG tablet Take 40 mg by mouth daily.     olmesartan (BENICAR) 40 MG tablet Take 40 mg by mouth daily.      omeprazole (PRILOSEC) 20 MG capsule Take 20 mg by mouth daily.      pravastatin (PRAVACHOL) 40 MG tablet Take 40 mg by mouth daily.        STOP taking these medications     aspirin 81 MG tablet      diltiazem (CARDIZEM CD) 300 MG 24 hr capsule        Follow-up Information    Follow up with Olen Pel W on 04/09/2011. (9:30 A)    Contact information:   [address] [place] [address] [phone]          BRING ALL MEDICATIONS WITH YOU TO  FOLLOW UP APPOINTMENTS  Time spent with patient to include physician time:45 min Signed: Sinclair Grooms 03/29/2011, 12:16 PM

## 2011-03-29 NOTE — Progress Notes (Signed)
Patient Name: Isabella [name] Date of Encounter: 03/29/2011    SUBJECTIVE: The patient seems somewhat discouraged today. She walked only one time yesterday. She is walked once today. One of the femoral drain since been removed. She notices some swelling in her right lower extremity.. There is no drainage from the right femoral incision. She is also concerned about puffiness/swelling in the right thigh. The right  thigh is sore.  TELEMETRY:  Normal sinus rhythm: Filed Vitals:   03/28/11 2105 03/28/11 2152 03/29/11 0518 03/29/11 0528  BP: 110/67 129/83 103/60   Pulse: 78 74 76   Temp:  97.7 F (36.5 C) 97.5 F (36.4 C)   TempSrc:  Oral Oral   Resp:  18 19   Height:      Weight:    100.9 kg (222 lb 7.1 oz)  SpO2:  98% 95%     Intake/Output Summary (Last 24 hours) at 03/29/11 1143 Last data filed at 03/28/11 2104  Gross per 24 hour  Intake   1203 ml  Output     45 ml  Net   1158 ml    LABS: CBC:  Basename 03/28/11 0545 03/27/11 0937 03/27/11 0543  WBC 7.6 -- 7.1  NEUTROABS -- 6.2 --  HGB 9.0* -- 7.8*  HCT 26.8* -- 22.9*  MCV 87.9 -- 88.1  PLT 181 -- 147*   Cardiac Enzymes:  Basename 03/27/11 0543  CKTOTAL 125  CKMB 3.9  CKMBINDEX --  TROPONINI 2.51*   Radiology/Studies:  No new studies  Physical Exam: Blood pressure 103/60, pulse 76, temperature 97.5 F (36.4 C), temperature source Oral, resp. rate 19, height 5' 2"  (1.575 m), weight 100.9 kg (222 lb 7.1 oz), SpO2 95.00%. Weight change: 0.5 kg (1 lb 1.6 oz)   Patient appears comfortable. Her complexion is less pale than on previous days. Lungs are clear. An S4 gallop is audible. The right femoral region has a bandage removed. Staples are noted. No drainage is noted. No erythema is noted. The right lower extremity demonstrated mild swelling in the thigh that was nonpitting. There is 1+ edema in the right ankle and trace to 1+ on the left. No warmth is noted.  ASSESSMENT: 1. Acute blood loss anemia, resolved after  transfusion.  2. Right femoral hemorrhage at the time of sheath removal following acute intervention while in the coronary care unit. The hematoma that developed had to be surgically evacuated by Dr. Ruta Hinds. One the surgical drains has been removed. The other continues to drain a decreasing amount of serosanguineous  fluid.  3. Acute inferior myocardial infarction, without recurrent symptoms since angioplasty of the posterolateral obtuse marginal branch of the right coronary and stenting of the ostium of the right coronary.  4. Mild right lower extremity edema probably secondary to the recent hematoma and surgical procedure. It is difficult for me however to totally exclude the possibility of DVT.   Plan:  1. I've encouraged ambulation.  2. I will resume DVT prophylaxis with Lovenox.   3. The patient is eligible for discharge from a cardiac standpoint.  4. When ready for discharge per vascular surgery, we will send her home.  Demetrios Isaacs 03/29/2011, 11:43 AM

## 2011-03-29 NOTE — Progress Notes (Addendum)
[facility] Progress Note  03/29/2011 8:10 AM POD 4  Feeling better  Filed Vitals:   03/29/11 0518  BP: 103/60  Pulse: 76  Temp: 97.5 F (36.4 C)  Resp: 19    Incisions:  C/d/i...staples in tact and no drainage. Extremities:  RLE warm.  CBC    Component Value Date/Time   WBC 7.6 03/28/2011 0545   RBC 3.05* 03/28/2011 0545   HGB 9.0* 03/28/2011 0545   HCT 26.8* 03/28/2011 0545   PLT 181 03/28/2011 0545   MCV 87.9 03/28/2011 0545   MCH 29.5 03/28/2011 0545   MCHC 33.6 03/28/2011 0545   RDW 17.0* 03/28/2011 0545   LYMPHSABS 1.0 03/27/2011 0937   MONOABS 0.5 03/27/2011 0937   EOSABS 0.2 03/27/2011 0937   BASOSABS 0.0 03/27/2011 0937    BMET    Component Value Date/Time   NA 142 03/25/2011 0930   K 3.7 03/25/2011 0930   CL 113* 03/25/2011 0930   CO2 24 03/25/2011 0930   GLUCOSE 121* 03/25/2011 0930   BUN 9 03/25/2011 0930   CREATININE 0.60 03/25/2011 0930   CALCIUM 7.2* 03/25/2011 0930   GFRNONAA >90 03/25/2011 0930   GFRAA >90 03/25/2011 0930    INR    Component Value Date/Time   INR 1.03 03/24/2011 1500     Intake/Output Summary (Last 24 hours) at 03/29/11 0810 Last data filed at 03/28/11 2104  Gross per 24 hour  Intake   1443 ml  Output     45 ml  Net   1398 ml   Drain #1:  15 cc Drain #2:  45 cc   Assessment/Plan:  70 y.o. female is s/p Evacuation of hematoma right groin   POD 4  Drain has 50cc this am and had 45cc.  Fluid is serosanguinous.  Will d/w Dr. Oneida Alar to see if he wants to remove the drain this am.  Otherwise, pt doing very well.  Evorn Gong, PA-C [facility] [phone] 03/29/2011 8:10 AM   Exam details as above  Ok to d/c drain and d/c home  Ruta Hinds, MD [facility] [place] Office: [phone] Pager: [phone]

## 2011-03-29 NOTE — Progress Notes (Addendum)
CARDIAC REHAB PHASE I   PRE:  Rate/Rhythm:72 sr  BP:  Supine:   Sitting: 120/68  Standing:    SaO2: 99 % ra  MODE:  Ambulation: 340 ft   POST:  Rate/Rhythem: 76 sr  BP:  Supine:   Sitting: 124/82  Standing:    SaO2: 100 % ra  1015 - 1045 Pt ambulated with 1 assist stopped x 3 for "tired" denies sob, cp. Back to chair with feet elevated call bell in reach.   Isabella [name]

## 2011-04-02 ENCOUNTER — Encounter: Payer: Self-pay | Admitting: Thoracic Diseases

## 2011-04-03 ENCOUNTER — Ambulatory Visit (INDEPENDENT_AMBULATORY_CARE_PROVIDER_SITE_OTHER): Payer: Medicare Other | Admitting: Thoracic Diseases

## 2011-04-03 ENCOUNTER — Encounter: Payer: Self-pay | Admitting: Thoracic Diseases

## 2011-04-03 VITALS — BP 112/47 | HR 63 | Resp 18 | Ht 62.0 in | Wt 210.0 lb

## 2011-04-03 DIAGNOSIS — T148XXA Other injury of unspecified body region, initial encounter: Secondary | ICD-10-CM

## 2011-04-03 DIAGNOSIS — T8149XA Infection following a procedure, other surgical site, initial encounter: Secondary | ICD-10-CM

## 2011-04-03 DIAGNOSIS — T8140XA Infection following a procedure, unspecified, initial encounter: Secondary | ICD-10-CM

## 2011-04-03 MED ORDER — CIPROFLOXACIN HCL 500 MG PO TABS: 500.0000 mg | ORAL_TABLET | Freq: Two times a day (BID) | ORAL | Status: AC

## 2011-04-03 NOTE — Patient Instructions (Addendum)
Cipro 563m BID x 7 days Probiotuics BID OTC X 2 weeks Paint wound with Betadine BID and place dry dressing Pt will RTC sooner if developes fever, chills or increased drainage from wounds F/U Dr. FOneida Alarnext week for staple removal and wound check

## 2011-04-03 NOTE — Progress Notes (Signed)
[facility] [place]  Postoperative Visit Bypass Surgery Date of Surgery: 03/25/11 - Evac Hematoma and repair of right CFA S/P cardiac cath Surgeon: CE Fields, MD  History of Present Illness  Isabella [name] is a 70 y.o. female who presents for postoperative follow-up for: right groin evacuation of hematoma and repair of Right CFA surgery.  The patient's has had min serrous drainage from wounds. She had had a drain in wound which was removed Friday prior to DC. She denies fever or chills. She has has min pain in the area. There is an area of induration around wound which family thought might be bigger.  Patients pain is well controlled.  Physical Examination  Filed Vitals:   04/03/11 1556  BP: 112/47  Pulse: 63  Resp: 18    Pt is A&O x 3 Gait is normal right lower extremity: Incision/s is/are erythematous around staples but not actively draining, There is some serrous drainage on undergarments There is an area of induration around the incision from the previuos hematoma but no pulsitile mass. Right LE is warm with good sensation and motion.   Medical Decision Making  Isabella [name] is a 70 y.o. year old female who presents s/p  Evac Hematoma and repair of right CFA S/P cardiac cath   The patient's incision is erythematous around staples with min serrous drainage. She has indurated area around incision as expected from previous hematoma Cipro 565m BID x 7 days Probiotuics BID OTC X 2 weeks Paint wound with Betadine BID and place dry dressing Pt will RTC sooner if developes fever, chills or increased drainage from wounds F/U Dr. FOneida Alarnext week for staple removal and wound check  Clinic MD: CS DScot Dock MD

## 2011-04-10 ENCOUNTER — Encounter: Payer: Self-pay | Admitting: Thoracic Diseases

## 2011-04-11 ENCOUNTER — Ambulatory Visit (INDEPENDENT_AMBULATORY_CARE_PROVIDER_SITE_OTHER): Payer: Medicare Other | Admitting: Thoracic Diseases

## 2011-04-11 VITALS — BP 128/41 | HR 60 | Resp 18 | Ht 62.0 in | Wt 215.0 lb

## 2011-04-11 DIAGNOSIS — T148XXA Other injury of unspecified body region, initial encounter: Secondary | ICD-10-CM

## 2011-04-11 NOTE — Patient Instructions (Signed)
Paint upper portion of wound with betadine daily May shower then apply hydrogel and dry dressing over lower portion of wound which is separated daily Follow-up with Dr. Oneida Alar 2 weeks

## 2011-04-11 NOTE — Progress Notes (Signed)
[facility]    Date of Surgery: 03/25/11 - Evac Hematoma and repair of right CFA S/P cardiac cath  Surgeon: CE Fields, MD  History of Present Illness  Isabella [name] is a 70 y.o. female who presents for postoperative follow-up for: right groin evacuation of hematoma and repair of Right CFA surgery. The patient's has had min serrous drainage from wounds. She had had a drain in wound which was removed Friday prior to DC. She denies fever or chills. She has has min pain in the area. There is an area of induration around wound which family thought might be bigger. She has been on Cipro x 10 days . She states she has had min drng on dressing from wound.   Physical Examination  Filed Vitals:   04/11/11 1514  BP: 128/41  Pulse: 60  Resp: 18    Pt is A&O x 3 Gait is normal right lower extremity: Incision/s is/are separated at the lower pole/ exudative tissue debrided;  less induration and redness after antibiotic completion. Medical Decision Making  Isabella [name] is a 70 y.o. year old female who presents s/p Evac Hematoma and repair of right CFA S/P cardiac cath   The patient's incision is separated approx 77m wide at lower pole of incision  Erythema resolving over entire wound with local wound care and completion of antibiotic regimen Follow-up 2 weeks  Clinic MD: CE FOneida Alar MD

## 2011-04-18 ENCOUNTER — Ambulatory Visit: Payer: Medicare Other

## 2011-04-18 ENCOUNTER — Ambulatory Visit (INDEPENDENT_AMBULATORY_CARE_PROVIDER_SITE_OTHER): Payer: Medicare Other | Admitting: Vascular Surgery

## 2011-04-18 ENCOUNTER — Encounter: Payer: Self-pay | Admitting: Vascular Surgery

## 2011-04-18 VITALS — BP 148/81 | HR 64 | Temp 98.1°F | Resp 16 | Ht 62.0 in | Wt 220.0 lb

## 2011-04-18 DIAGNOSIS — S301XXA Contusion of abdominal wall, initial encounter: Secondary | ICD-10-CM

## 2011-04-18 NOTE — Progress Notes (Signed)
Patient is status post evacuation of hematoma the right groin 03/25/2011. She presented to the office today with concerns about the wound healing in the right groin. She denies any fever or chills. She has had no significant drainage from the right groin. She is doing hydrogel dressings once daily.  Physical Exam: The upper half of the right groin incision is completely healed at this point. There is a 2 mm x 4 cm open area of the lower portion of the incision there is some fibrinous exudate. There is good granulation tissue beneath this. Overall the wound is healing well.  Assessment: Healing wound right groin status post evacuation of hematoma  Plan: Continue hydrogel dressings  The patient has followup in a few weeks

## 2011-04-30 ENCOUNTER — Ambulatory Visit
Admission: RE | Admit: 2011-04-30 | Discharge: 2011-04-30 | Disposition: A | Discharge status: Home / Self Care | Payer: Medicare Other | Source: Ambulatory Visit | Attending: Family Medicine | Admitting: Family Medicine

## 2011-04-30 DIAGNOSIS — Z1231 Encounter for screening mammogram for malignant neoplasm of breast: Secondary | ICD-10-CM

## 2011-05-01 ENCOUNTER — Encounter: Payer: Self-pay | Admitting: Vascular Surgery

## 2011-05-02 ENCOUNTER — Encounter: Payer: Self-pay | Admitting: Vascular Surgery

## 2011-05-02 ENCOUNTER — Ambulatory Visit (INDEPENDENT_AMBULATORY_CARE_PROVIDER_SITE_OTHER): Payer: Medicare Other | Admitting: Vascular Surgery

## 2011-05-02 VITALS — BP 128/78 | HR 67 | Temp 98.0°F | Ht 62.0 in | Wt 220.0 lb

## 2011-05-02 DIAGNOSIS — S301XXA Contusion of abdominal wall, initial encounter: Secondary | ICD-10-CM | POA: Insufficient documentation

## 2011-05-02 NOTE — Progress Notes (Signed)
Patient is status post evacuation of hematoma the right groin 03/25/2011. She denies any fever or chills. She has had no significant drainage from the right groin. She is doing hydrogel dressings once daily.   Physical Exam: The upper half of the right groin incision is completely healed at this point. There is a 2 mm x 3.5 cm open area of the lower portion of the incision there is some fibrinous exudate.  Overall the wound is healing well.    Assessment: Healing wound right groin status post evacuation of hematoma  Plan: Continue hydrogel dressings    The will followup on as-needed basis  Ruta Hinds, MD [facility] [place] Office: [phone] Pager: [phone]

## 2011-05-23 ENCOUNTER — Encounter (HOSPITAL_COMMUNITY)
Admission: RE | Admit: 2011-05-23 | Discharge: 2011-05-23 | Disposition: A | Discharge status: Home / Self Care | Payer: Medicare Other | Source: Ambulatory Visit | Attending: Interventional Cardiology | Admitting: Interventional Cardiology

## 2011-05-23 DIAGNOSIS — Z9861 Coronary angioplasty status: Secondary | ICD-10-CM | POA: Insufficient documentation

## 2011-05-23 DIAGNOSIS — E785 Hyperlipidemia, unspecified: Secondary | ICD-10-CM | POA: Insufficient documentation

## 2011-05-23 DIAGNOSIS — I10 Essential (primary) hypertension: Secondary | ICD-10-CM | POA: Insufficient documentation

## 2011-05-23 DIAGNOSIS — M7989 Other specified soft tissue disorders: Secondary | ICD-10-CM | POA: Insufficient documentation

## 2011-05-23 DIAGNOSIS — I2542 Coronary artery dissection: Secondary | ICD-10-CM | POA: Insufficient documentation

## 2011-05-23 DIAGNOSIS — G4730 Sleep apnea, unspecified: Secondary | ICD-10-CM | POA: Insufficient documentation

## 2011-05-23 DIAGNOSIS — Z7902 Long term (current) use of antithrombotics/antiplatelets: Secondary | ICD-10-CM | POA: Insufficient documentation

## 2011-05-23 DIAGNOSIS — E669 Obesity, unspecified: Secondary | ICD-10-CM | POA: Insufficient documentation

## 2011-05-23 DIAGNOSIS — I252 Old myocardial infarction: Secondary | ICD-10-CM | POA: Insufficient documentation

## 2011-05-23 DIAGNOSIS — Z5189 Encounter for other specified aftercare: Secondary | ICD-10-CM | POA: Insufficient documentation

## 2011-05-27 ENCOUNTER — Encounter (HOSPITAL_COMMUNITY)
Admission: RE | Admit: 2011-05-27 | Discharge: 2011-05-27 | Disposition: A | Discharge status: Home / Self Care | Payer: Medicare Other | Source: Ambulatory Visit | Attending: Interventional Cardiology | Admitting: Interventional Cardiology

## 2011-05-27 NOTE — Progress Notes (Signed)
Pt started cardiac rehab today.  Pt tolerated light exercise without difficulty. Denies chest pain or dyspnea.  VSS. Telemetry-NSR.  Pt oriented on equipment and exercise routine.  Pt verbalized understanding.Will continue to monitor the patient throughout  the program.  -jr,rn

## 2011-05-29 ENCOUNTER — Encounter (HOSPITAL_COMMUNITY)
Admission: RE | Admit: 2011-05-29 | Discharge: 2011-05-29 | Disposition: A | Discharge status: Home / Self Care | Payer: Medicare Other | Source: Ambulatory Visit | Attending: Interventional Cardiology | Admitting: Interventional Cardiology

## 2011-05-30 NOTE — Progress Notes (Signed)
Isabella [name] 71 y.o. female       Nutrition Screen                                                                    YES  NO Do you live in a nursing home?  X   Do you eat out more than 3 times/week?   X  If yes, how many times per week do you eat out?  Do you have food allergies?   X If yes, what are you allergic to?  Have you gained or lost more than 10 lbs without trying?               X If yes, how much weight have you lost and over what time period?  lbs gained or lost over  weeks/month  Do you want to lose weight?    X  If yes, what is a goal weight or amount of weight you would like to lose? "a lot"  Do you eat alone most of the time?   X   Do you eat less than 2 meals/day?  X If yes, how many meals do you eat?  Do you drink more than 3 alcohol drinks/day?  X If yes, how many drinks per day?  Are you having trouble with constipation? *  X If yes, what are you doing to help relieve constipation?  Do you have financial difficulties with buying food?*    X   Are you experiencing regular nausea/ vomiting?*     X   Do you have a poor appetite? *                                        X   Do you have trouble chewing/swallowing? *   X    Pt with diagnoses of:  X Stent/ PTCA X GERD          X Dyslipidemia  / HDL< 40 / LDL>70 / High TG      X %  Body fat >goal / Body Mass Index >25 X HTN / BP >120/80 X MI       Pt Risk Score    2       Diagnosis Risk Score   30       Total Risk Score   32                         High Risk               X Low Risk              HT: 61" Ht Readings from Last 1 Encounters:  05/23/11 5' 1"  (1.549 m)    WT:   218.5 lb (99.3 kg) Wt Readings from Last 3 Encounters:  05/23/11 218 lb 14.7 oz (99.3 kg)  05/02/11 220 lb (99.791 kg)  04/18/11 220 lb (99.791 kg)     IBW 47.7 208%IBW BMI 41.4 51.4%body fat   Meds reviewed: Lasix  Past Medical History  Diagnosis Date  . Hypertension   . High cholesterol   .  Sleep apnea   . Coronary artery  disease         Activity level: Pt is sedentary  Wt goal: 194-206 lb ( 88.4-93.6 kg) Current tobacco use? No      Food/Drug Interaction? No      Labs:  Lipid Panel     Component Value Date/Time   CHOL 179 03/24/2011 1742   TRIG 105 03/24/2011 1742   HDL 73 03/24/2011 1742   CHOLHDL 2.5 03/24/2011 1742   VLDL 21 03/24/2011 1742   LDLCALC 85 03/24/2011 1742   No results found for this basename: HGBA1C   03/25/11 Glucose 121   LDL goal:< 70      MI and > 2:  HTN, >71 yo female   Estimated Daily Nutrition Needs for: ? wt loss  1200-1700 Kcal , Total Fat 30-45gm, Saturated Fat 9-13 gm, Trans Fat 1.2-1.7 gm,  Sodium less than 1500 mg

## 2011-05-31 ENCOUNTER — Encounter (HOSPITAL_COMMUNITY): Payer: Medicare Other

## 2011-06-03 ENCOUNTER — Encounter (HOSPITAL_COMMUNITY)
Admission: RE | Admit: 2011-06-03 | Discharge: 2011-06-03 | Disposition: A | Discharge status: Home / Self Care | Payer: Medicare Other | Source: Ambulatory Visit | Attending: Interventional Cardiology | Admitting: Interventional Cardiology

## 2011-06-05 ENCOUNTER — Encounter (HOSPITAL_COMMUNITY)
Admission: RE | Admit: 2011-06-05 | Discharge: 2011-06-05 | Disposition: A | Discharge status: Home / Self Care | Payer: Medicare Other | Source: Ambulatory Visit | Attending: Interventional Cardiology | Admitting: Interventional Cardiology

## 2011-06-05 NOTE — Progress Notes (Signed)
Reviewed home exercise with pt today.  Pt plans to walk at home and use [employer] for 30 minutes on the off days from outpatient cardiac rehab for exercise.  Reviewed THR, pulse, RPE, sign and symptoms, NTG use, and when to call 911 or MD.  Pt voiced understanding. Thea Silversmith MS 06/05/11 1436

## 2011-06-07 ENCOUNTER — Encounter (HOSPITAL_COMMUNITY): Payer: Medicare Other

## 2011-06-10 ENCOUNTER — Encounter (HOSPITAL_COMMUNITY)
Admission: RE | Admit: 2011-06-10 | Discharge: 2011-06-10 | Disposition: A | Discharge status: Home / Self Care | Payer: Medicare Other | Source: Ambulatory Visit | Attending: Interventional Cardiology | Admitting: Interventional Cardiology

## 2011-06-10 NOTE — Progress Notes (Signed)
Isabella [name] 71 y.o. female Nutrition Note  Spoke with pt.  Nutrition Plan and Nutrition Survey reviewed with pt.  Pt is following a step 2 heart healthy diet with respect to fat intake.  Pt consumed 0 servings of fruits/veggies on her 24 hr food survey. Weight loss tips discussed.  Pt reports she has lost wt previously by making lifestyle changes and "then my wt slowly creeps back up."  Nutrition Diagnosis   Food-and nutrition-related knowledge deficit related to lack of exposure to information as related to diagnosis of: ? CVD    Obesity related to excessive energy intake as evidenced by a BMI of 41.4  Nutrition RX/ Estimated Daily Nutrition Needs for: wt loss 1200-1700 Kcal , Total Fat 30-45gm, Saturated Fat 9-13 gm, Trans Fat 1.2-1.7 gm,  Sodium less than 1500 mg   Nutrition Intervention   Pt's individual nutrition plan including cholesterol goals reviewed with pt.   Benefits of adopting Therapeutic Lifestyle Changes discussed when Medficts reviewed.   Pt to attend the Portion Distortion class   Pt to attend the  ? Nutrition I class                     ? Nutrition II class   Continue client-centered nutrition education by RD, as part of interdisciplinary care. Goal(s)   Pt to identify food quantities necessary to achieve: ? wt loss to a goal wt of 194-206 lb (88.4-93.6 kg) at graduation from cardiac rehab.    Pt to describe the benefit of including fruits, vegetables, whole grains, and low-fat dairy products in a heart healthy meal plan. Monitor and Evaluate progress toward nutrition goal with team.

## 2011-06-12 ENCOUNTER — Encounter (HOSPITAL_COMMUNITY): Payer: Medicare Other

## 2011-06-14 ENCOUNTER — Encounter (HOSPITAL_COMMUNITY)
Admission: RE | Admit: 2011-06-14 | Discharge: 2011-06-14 | Disposition: A | Discharge status: Home / Self Care | Payer: Medicare Other | Source: Ambulatory Visit | Attending: Interventional Cardiology | Admitting: Interventional Cardiology

## 2011-06-14 DIAGNOSIS — I252 Old myocardial infarction: Secondary | ICD-10-CM | POA: Insufficient documentation

## 2011-06-14 DIAGNOSIS — I2542 Coronary artery dissection: Secondary | ICD-10-CM | POA: Insufficient documentation

## 2011-06-14 DIAGNOSIS — Z5189 Encounter for other specified aftercare: Secondary | ICD-10-CM | POA: Insufficient documentation

## 2011-06-14 DIAGNOSIS — G4730 Sleep apnea, unspecified: Secondary | ICD-10-CM | POA: Insufficient documentation

## 2011-06-14 DIAGNOSIS — Z9861 Coronary angioplasty status: Secondary | ICD-10-CM | POA: Insufficient documentation

## 2011-06-14 DIAGNOSIS — I10 Essential (primary) hypertension: Secondary | ICD-10-CM | POA: Insufficient documentation

## 2011-06-14 DIAGNOSIS — E785 Hyperlipidemia, unspecified: Secondary | ICD-10-CM | POA: Insufficient documentation

## 2011-06-14 DIAGNOSIS — E669 Obesity, unspecified: Secondary | ICD-10-CM | POA: Insufficient documentation

## 2011-06-14 DIAGNOSIS — M7989 Other specified soft tissue disorders: Secondary | ICD-10-CM | POA: Insufficient documentation

## 2011-06-14 DIAGNOSIS — Z7902 Long term (current) use of antithrombotics/antiplatelets: Secondary | ICD-10-CM | POA: Insufficient documentation

## 2011-06-17 ENCOUNTER — Encounter (HOSPITAL_COMMUNITY)
Admission: RE | Admit: 2011-06-17 | Discharge: 2011-06-17 | Disposition: A | Discharge status: Home / Self Care | Payer: Medicare Other | Source: Ambulatory Visit | Attending: Interventional Cardiology | Admitting: Interventional Cardiology

## 2011-06-19 ENCOUNTER — Ambulatory Visit (INDEPENDENT_AMBULATORY_CARE_PROVIDER_SITE_OTHER): Payer: Medicare Other | Admitting: Pulmonary Disease

## 2011-06-19 ENCOUNTER — Encounter: Payer: Self-pay | Admitting: Pulmonary Disease

## 2011-06-19 ENCOUNTER — Encounter (HOSPITAL_COMMUNITY)
Admission: RE | Admit: 2011-06-19 | Discharge: 2011-06-19 | Disposition: A | Discharge status: Home / Self Care | Payer: Medicare Other | Source: Ambulatory Visit | Attending: Interventional Cardiology | Admitting: Interventional Cardiology

## 2011-06-19 VITALS — BP 122/78 | HR 56 | Temp 98.0°F | Ht 62.0 in | Wt 210.6 lb

## 2011-06-19 DIAGNOSIS — G4733 Obstructive sleep apnea (adult) (pediatric): Secondary | ICD-10-CM

## 2011-06-19 NOTE — Progress Notes (Signed)
  Subjective:    Patient ID: Isabella [name], female    DOB: 01-29-1941, 71 y.o.   MRN: [id number]  HPI The patient comes in today for followup of her mild obstructive sleep apnea.  She has been doing very well with CPAP, and recently got a new device.  She feels that she is sleeping well, and denies any issues with daytime alertness.  She is having a few mask leaks currently, but is due for new seals.  The patient's weight has gone down approximately 11 pounds since the last visit.   Review of Systems  Constitutional: Negative for fever and unexpected weight change.  HENT: Positive for rhinorrhea. Negative for ear pain, nosebleeds, congestion, sore throat, sneezing, trouble swallowing, dental problem, postnasal drip and sinus pressure.   Eyes: Negative for redness and itching.  Respiratory: Positive for cough. Negative for chest tightness, shortness of breath and wheezing.   Cardiovascular: Negative for palpitations and leg swelling.  Gastrointestinal: Negative for nausea and vomiting.  Genitourinary: Negative for dysuria.  Musculoskeletal: Negative for joint swelling.  Skin: Negative for rash.  Neurological: Negative for headaches.  Hematological: Bruises/bleeds easily.  Psychiatric/Behavioral: Negative for dysphoric mood. The patient is not nervous/anxious.        Objective:   Physical Exam Overweight female in no acute distress No skin breakdown from pressure necrosis from the CPAP mask Lower extremities with no significant edema, no cyanosis Alert, does not appear to be sleepy, moves all 4 extremities.       Assessment & Plan:

## 2011-06-19 NOTE — Patient Instructions (Signed)
Stay on cpap, and continue to work on weight reduction Will send an order to your dme for new supplies. followup with me in 1 year if doing well.

## 2011-06-19 NOTE — Assessment & Plan Note (Signed)
The patient is doing very well from a sleep apnea standpoint.  She is sleeping well, and has no daytime alertness issues.  She has lost 11 pounds since the last visit, and I have encouraged her to continue with this.  She will need new supplies, and I will send an order to her DME.  She is to followup with me in one year if doing well.

## 2011-06-21 ENCOUNTER — Encounter (HOSPITAL_COMMUNITY)
Admission: RE | Admit: 2011-06-21 | Discharge: 2011-06-21 | Disposition: A | Discharge status: Home / Self Care | Payer: Medicare Other | Source: Ambulatory Visit | Attending: Interventional Cardiology | Admitting: Interventional Cardiology

## 2011-06-24 ENCOUNTER — Encounter (HOSPITAL_COMMUNITY)
Admission: RE | Admit: 2011-06-24 | Discharge: 2011-06-24 | Disposition: A | Discharge status: Home / Self Care | Payer: Medicare Other | Source: Ambulatory Visit | Attending: Interventional Cardiology | Admitting: Interventional Cardiology

## 2011-06-26 ENCOUNTER — Encounter (HOSPITAL_COMMUNITY)
Admission: RE | Admit: 2011-06-26 | Discharge: 2011-06-26 | Disposition: A | Discharge status: Home / Self Care | Payer: Medicare Other | Source: Ambulatory Visit | Attending: Interventional Cardiology | Admitting: Interventional Cardiology

## 2011-06-28 ENCOUNTER — Encounter (HOSPITAL_COMMUNITY)
Admission: RE | Admit: 2011-06-28 | Discharge: 2011-06-28 | Disposition: A | Discharge status: Home / Self Care | Payer: Medicare Other | Source: Ambulatory Visit | Attending: Interventional Cardiology | Admitting: Interventional Cardiology

## 2011-07-01 ENCOUNTER — Encounter (HOSPITAL_COMMUNITY): Payer: Medicare Other

## 2011-07-03 ENCOUNTER — Encounter (HOSPITAL_COMMUNITY)
Admission: RE | Admit: 2011-07-03 | Discharge: 2011-07-03 | Disposition: A | Discharge status: Home / Self Care | Payer: Medicare Other | Source: Ambulatory Visit | Attending: Interventional Cardiology | Admitting: Interventional Cardiology

## 2011-07-05 ENCOUNTER — Encounter (HOSPITAL_COMMUNITY)
Admission: RE | Admit: 2011-07-05 | Discharge: 2011-07-05 | Disposition: A | Discharge status: Home / Self Care | Payer: Medicare Other | Source: Ambulatory Visit | Attending: Interventional Cardiology | Admitting: Interventional Cardiology

## 2011-07-08 ENCOUNTER — Encounter (HOSPITAL_COMMUNITY): Payer: Medicare Other

## 2011-07-10 ENCOUNTER — Encounter (HOSPITAL_COMMUNITY): Payer: Medicare Other

## 2011-07-12 ENCOUNTER — Encounter (HOSPITAL_COMMUNITY)
Admission: RE | Admit: 2011-07-12 | Discharge: 2011-07-12 | Disposition: A | Discharge status: Home / Self Care | Payer: Medicare Other | Source: Ambulatory Visit | Attending: Interventional Cardiology | Admitting: Interventional Cardiology

## 2011-07-12 DIAGNOSIS — I2542 Coronary artery dissection: Secondary | ICD-10-CM | POA: Insufficient documentation

## 2011-07-12 DIAGNOSIS — G4730 Sleep apnea, unspecified: Secondary | ICD-10-CM | POA: Insufficient documentation

## 2011-07-12 DIAGNOSIS — Z7902 Long term (current) use of antithrombotics/antiplatelets: Secondary | ICD-10-CM | POA: Insufficient documentation

## 2011-07-12 DIAGNOSIS — M7989 Other specified soft tissue disorders: Secondary | ICD-10-CM | POA: Insufficient documentation

## 2011-07-12 DIAGNOSIS — Z9861 Coronary angioplasty status: Secondary | ICD-10-CM | POA: Insufficient documentation

## 2011-07-12 DIAGNOSIS — I252 Old myocardial infarction: Secondary | ICD-10-CM | POA: Insufficient documentation

## 2011-07-12 DIAGNOSIS — E785 Hyperlipidemia, unspecified: Secondary | ICD-10-CM | POA: Insufficient documentation

## 2011-07-12 DIAGNOSIS — E669 Obesity, unspecified: Secondary | ICD-10-CM | POA: Insufficient documentation

## 2011-07-12 DIAGNOSIS — I10 Essential (primary) hypertension: Secondary | ICD-10-CM | POA: Insufficient documentation

## 2011-07-12 DIAGNOSIS — Z5189 Encounter for other specified aftercare: Secondary | ICD-10-CM | POA: Insufficient documentation

## 2011-07-15 ENCOUNTER — Encounter (HOSPITAL_COMMUNITY): Payer: Medicare Other

## 2011-07-17 ENCOUNTER — Encounter (HOSPITAL_COMMUNITY)
Admission: RE | Admit: 2011-07-17 | Discharge: 2011-07-17 | Disposition: A | Discharge status: Home / Self Care | Payer: Medicare Other | Source: Ambulatory Visit | Attending: Interventional Cardiology | Admitting: Interventional Cardiology

## 2011-07-19 ENCOUNTER — Encounter (HOSPITAL_COMMUNITY)
Admission: RE | Admit: 2011-07-19 | Discharge: 2011-07-19 | Disposition: A | Discharge status: Home / Self Care | Payer: Medicare Other | Source: Ambulatory Visit | Attending: Interventional Cardiology | Admitting: Interventional Cardiology

## 2011-07-22 ENCOUNTER — Encounter (HOSPITAL_COMMUNITY): Payer: Medicare Other

## 2011-07-24 ENCOUNTER — Encounter (HOSPITAL_COMMUNITY)
Admission: RE | Admit: 2011-07-24 | Discharge: 2011-07-24 | Disposition: A | Discharge status: Home / Self Care | Payer: Medicare Other | Source: Ambulatory Visit | Attending: Interventional Cardiology | Admitting: Interventional Cardiology

## 2011-07-26 ENCOUNTER — Encounter (HOSPITAL_COMMUNITY)
Admission: RE | Admit: 2011-07-26 | Discharge: 2011-07-26 | Disposition: A | Discharge status: Home / Self Care | Payer: Medicare Other | Source: Ambulatory Visit | Attending: Interventional Cardiology | Admitting: Interventional Cardiology

## 2011-07-29 ENCOUNTER — Encounter (HOSPITAL_COMMUNITY): Admission: RE | Admit: 2011-07-29 | Payer: Medicare Other | Source: Ambulatory Visit

## 2011-07-31 ENCOUNTER — Encounter (HOSPITAL_COMMUNITY)
Admission: RE | Admit: 2011-07-31 | Discharge: 2011-07-31 | Disposition: A | Discharge status: Home / Self Care | Payer: Medicare Other | Source: Ambulatory Visit | Attending: Interventional Cardiology | Admitting: Interventional Cardiology

## 2011-08-02 ENCOUNTER — Encounter (HOSPITAL_COMMUNITY)
Admission: RE | Admit: 2011-08-02 | Discharge: 2011-08-02 | Disposition: A | Discharge status: Home / Self Care | Payer: Medicare Other | Source: Ambulatory Visit | Attending: Interventional Cardiology | Admitting: Interventional Cardiology

## 2011-08-05 ENCOUNTER — Encounter (HOSPITAL_COMMUNITY): Payer: Medicare Other

## 2011-08-07 ENCOUNTER — Encounter (HOSPITAL_COMMUNITY)
Admission: RE | Admit: 2011-08-07 | Discharge: 2011-08-07 | Disposition: A | Discharge status: Home / Self Care | Payer: Medicare Other | Source: Ambulatory Visit | Attending: Interventional Cardiology | Admitting: Interventional Cardiology

## 2011-08-09 ENCOUNTER — Encounter (HOSPITAL_COMMUNITY)
Admission: RE | Admit: 2011-08-09 | Discharge: 2011-08-09 | Disposition: A | Discharge status: Home / Self Care | Payer: Medicare Other | Source: Ambulatory Visit | Attending: Interventional Cardiology | Admitting: Interventional Cardiology

## 2011-08-12 ENCOUNTER — Encounter (HOSPITAL_COMMUNITY)
Admission: RE | Admit: 2011-08-12 | Discharge: 2011-08-12 | Disposition: A | Discharge status: Home / Self Care | Payer: Medicare Other | Source: Ambulatory Visit | Attending: Interventional Cardiology | Admitting: Interventional Cardiology

## 2011-08-12 DIAGNOSIS — I10 Essential (primary) hypertension: Secondary | ICD-10-CM | POA: Insufficient documentation

## 2011-08-12 DIAGNOSIS — Z9861 Coronary angioplasty status: Secondary | ICD-10-CM | POA: Insufficient documentation

## 2011-08-12 DIAGNOSIS — I2542 Coronary artery dissection: Secondary | ICD-10-CM | POA: Insufficient documentation

## 2011-08-12 DIAGNOSIS — E669 Obesity, unspecified: Secondary | ICD-10-CM | POA: Insufficient documentation

## 2011-08-12 DIAGNOSIS — Z7902 Long term (current) use of antithrombotics/antiplatelets: Secondary | ICD-10-CM | POA: Insufficient documentation

## 2011-08-12 DIAGNOSIS — Z5189 Encounter for other specified aftercare: Secondary | ICD-10-CM | POA: Insufficient documentation

## 2011-08-12 DIAGNOSIS — E785 Hyperlipidemia, unspecified: Secondary | ICD-10-CM | POA: Insufficient documentation

## 2011-08-12 DIAGNOSIS — G4730 Sleep apnea, unspecified: Secondary | ICD-10-CM | POA: Insufficient documentation

## 2011-08-12 DIAGNOSIS — M7989 Other specified soft tissue disorders: Secondary | ICD-10-CM | POA: Insufficient documentation

## 2011-08-12 DIAGNOSIS — I252 Old myocardial infarction: Secondary | ICD-10-CM | POA: Insufficient documentation

## 2011-08-14 ENCOUNTER — Encounter (HOSPITAL_COMMUNITY)
Admission: RE | Admit: 2011-08-14 | Discharge: 2011-08-14 | Disposition: A | Discharge status: Home / Self Care | Payer: Medicare Other | Source: Ambulatory Visit | Attending: Interventional Cardiology | Admitting: Interventional Cardiology

## 2011-08-16 ENCOUNTER — Encounter (HOSPITAL_COMMUNITY)
Admission: RE | Admit: 2011-08-16 | Discharge: 2011-08-16 | Disposition: A | Discharge status: Home / Self Care | Payer: Medicare Other | Source: Ambulatory Visit | Attending: Interventional Cardiology | Admitting: Interventional Cardiology

## 2011-08-19 ENCOUNTER — Encounter (HOSPITAL_COMMUNITY): Payer: Medicare Other

## 2011-08-21 ENCOUNTER — Encounter (HOSPITAL_COMMUNITY)
Admission: RE | Admit: 2011-08-21 | Discharge: 2011-08-21 | Disposition: A | Discharge status: Home / Self Care | Payer: Medicare Other | Source: Ambulatory Visit | Attending: Interventional Cardiology | Admitting: Interventional Cardiology

## 2011-08-23 ENCOUNTER — Encounter (HOSPITAL_COMMUNITY)
Admission: RE | Admit: 2011-08-23 | Discharge: 2011-08-23 | Disposition: A | Discharge status: Home / Self Care | Payer: Medicare Other | Source: Ambulatory Visit | Attending: Interventional Cardiology | Admitting: Interventional Cardiology

## 2011-08-26 ENCOUNTER — Encounter (HOSPITAL_COMMUNITY): Payer: Medicare Other

## 2011-08-28 ENCOUNTER — Encounter (HOSPITAL_COMMUNITY)
Admission: RE | Admit: 2011-08-28 | Discharge: 2011-08-28 | Disposition: A | Discharge status: Home / Self Care | Payer: Medicare Other | Source: Ambulatory Visit | Attending: Interventional Cardiology | Admitting: Interventional Cardiology

## 2011-08-30 ENCOUNTER — Encounter (HOSPITAL_COMMUNITY): Payer: Medicare Other

## 2011-09-02 ENCOUNTER — Encounter (HOSPITAL_COMMUNITY): Payer: Medicare Other

## 2011-09-04 ENCOUNTER — Encounter (HOSPITAL_COMMUNITY)
Admission: RE | Admit: 2011-09-04 | Discharge: 2011-09-04 | Disposition: A | Discharge status: Home / Self Care | Payer: Medicare Other | Source: Ambulatory Visit | Attending: Interventional Cardiology | Admitting: Interventional Cardiology

## 2011-09-06 ENCOUNTER — Encounter (HOSPITAL_COMMUNITY)
Admission: RE | Admit: 2011-09-06 | Discharge: 2011-09-06 | Disposition: A | Discharge status: Home / Self Care | Payer: Medicare Other | Source: Ambulatory Visit | Attending: Interventional Cardiology | Admitting: Interventional Cardiology

## 2011-09-09 ENCOUNTER — Encounter (HOSPITAL_COMMUNITY): Payer: Medicare Other

## 2011-09-11 ENCOUNTER — Encounter (HOSPITAL_COMMUNITY)
Admission: RE | Admit: 2011-09-11 | Discharge: 2011-09-11 | Disposition: A | Discharge status: Home / Self Care | Payer: Medicare Other | Source: Ambulatory Visit | Attending: Interventional Cardiology | Admitting: Interventional Cardiology

## 2011-09-11 DIAGNOSIS — Z9861 Coronary angioplasty status: Secondary | ICD-10-CM | POA: Insufficient documentation

## 2011-09-11 DIAGNOSIS — Z7902 Long term (current) use of antithrombotics/antiplatelets: Secondary | ICD-10-CM | POA: Insufficient documentation

## 2011-09-11 DIAGNOSIS — G4730 Sleep apnea, unspecified: Secondary | ICD-10-CM | POA: Insufficient documentation

## 2011-09-11 DIAGNOSIS — M7989 Other specified soft tissue disorders: Secondary | ICD-10-CM | POA: Insufficient documentation

## 2011-09-11 DIAGNOSIS — E785 Hyperlipidemia, unspecified: Secondary | ICD-10-CM | POA: Insufficient documentation

## 2011-09-11 DIAGNOSIS — I10 Essential (primary) hypertension: Secondary | ICD-10-CM | POA: Insufficient documentation

## 2011-09-11 DIAGNOSIS — I2542 Coronary artery dissection: Secondary | ICD-10-CM | POA: Insufficient documentation

## 2011-09-11 DIAGNOSIS — I252 Old myocardial infarction: Secondary | ICD-10-CM | POA: Insufficient documentation

## 2011-09-11 DIAGNOSIS — E669 Obesity, unspecified: Secondary | ICD-10-CM | POA: Insufficient documentation

## 2011-09-11 DIAGNOSIS — Z5189 Encounter for other specified aftercare: Secondary | ICD-10-CM | POA: Insufficient documentation

## 2011-09-13 ENCOUNTER — Encounter (HOSPITAL_COMMUNITY)
Admission: RE | Admit: 2011-09-13 | Discharge: 2011-09-13 | Disposition: A | Discharge status: Home / Self Care | Payer: Medicare Other | Source: Ambulatory Visit | Attending: Interventional Cardiology | Admitting: Interventional Cardiology

## 2011-09-16 ENCOUNTER — Encounter (HOSPITAL_COMMUNITY): Payer: Medicare Other

## 2011-09-18 ENCOUNTER — Encounter (HOSPITAL_COMMUNITY): Payer: Self-pay

## 2011-09-18 ENCOUNTER — Encounter (HOSPITAL_COMMUNITY)
Admission: RE | Admit: 2011-09-18 | Discharge: 2011-09-18 | Disposition: A | Discharge status: Home / Self Care | Payer: Medicare Other | Source: Ambulatory Visit | Attending: Interventional Cardiology | Admitting: Interventional Cardiology

## 2011-09-20 ENCOUNTER — Encounter (HOSPITAL_COMMUNITY)
Admission: RE | Admit: 2011-09-20 | Discharge: 2011-09-20 | Disposition: A | Discharge status: Home / Self Care | Payer: Medicare Other | Source: Ambulatory Visit | Attending: Interventional Cardiology | Admitting: Interventional Cardiology

## 2011-09-20 NOTE — Progress Notes (Signed)
[employer] today.  Isabella [name] is interested in continuing exercise in the maintenance program.

## 2011-09-23 ENCOUNTER — Encounter (HOSPITAL_COMMUNITY): Payer: Medicare Other

## 2011-09-25 ENCOUNTER — Encounter (HOSPITAL_COMMUNITY): Payer: Medicare Other

## 2011-09-27 ENCOUNTER — Encounter (HOSPITAL_COMMUNITY): Payer: Medicare Other

## 2011-10-02 ENCOUNTER — Encounter (HOSPITAL_COMMUNITY)
Admission: RE | Admit: 2011-10-02 | Discharge: 2011-10-02 | Disposition: A | Discharge status: Home / Self Care | Payer: Self-pay | Source: Ambulatory Visit | Attending: Interventional Cardiology | Admitting: Interventional Cardiology

## 2011-10-02 DIAGNOSIS — E669 Obesity, unspecified: Secondary | ICD-10-CM | POA: Insufficient documentation

## 2011-10-02 DIAGNOSIS — Z9861 Coronary angioplasty status: Secondary | ICD-10-CM | POA: Insufficient documentation

## 2011-10-02 DIAGNOSIS — Z5189 Encounter for other specified aftercare: Secondary | ICD-10-CM | POA: Insufficient documentation

## 2011-10-02 DIAGNOSIS — I252 Old myocardial infarction: Secondary | ICD-10-CM | POA: Insufficient documentation

## 2011-10-02 DIAGNOSIS — Z7902 Long term (current) use of antithrombotics/antiplatelets: Secondary | ICD-10-CM | POA: Insufficient documentation

## 2011-10-02 DIAGNOSIS — G4730 Sleep apnea, unspecified: Secondary | ICD-10-CM | POA: Insufficient documentation

## 2011-10-02 DIAGNOSIS — E785 Hyperlipidemia, unspecified: Secondary | ICD-10-CM | POA: Insufficient documentation

## 2011-10-02 DIAGNOSIS — I2542 Coronary artery dissection: Secondary | ICD-10-CM | POA: Insufficient documentation

## 2011-10-02 DIAGNOSIS — I10 Essential (primary) hypertension: Secondary | ICD-10-CM | POA: Insufficient documentation

## 2011-10-04 ENCOUNTER — Encounter (HOSPITAL_COMMUNITY)
Admission: RE | Admit: 2011-10-04 | Discharge: 2011-10-04 | Disposition: A | Discharge status: Home / Self Care | Payer: Self-pay | Source: Ambulatory Visit | Attending: Interventional Cardiology | Admitting: Interventional Cardiology

## 2011-10-09 ENCOUNTER — Encounter (HOSPITAL_COMMUNITY)
Admission: RE | Admit: 2011-10-09 | Discharge: 2011-10-09 | Disposition: A | Discharge status: Home / Self Care | Payer: Self-pay | Source: Ambulatory Visit | Attending: Interventional Cardiology | Admitting: Interventional Cardiology

## 2011-10-11 ENCOUNTER — Encounter (HOSPITAL_COMMUNITY)
Admission: RE | Admit: 2011-10-11 | Discharge: 2011-10-11 | Disposition: A | Discharge status: Home / Self Care | Payer: Self-pay | Source: Ambulatory Visit | Attending: Interventional Cardiology | Admitting: Interventional Cardiology

## 2011-10-14 ENCOUNTER — Encounter (HOSPITAL_COMMUNITY)
Admission: RE | Admit: 2011-10-14 | Discharge: 2011-10-14 | Disposition: A | Discharge status: Home / Self Care | Payer: Self-pay | Source: Ambulatory Visit | Attending: Interventional Cardiology | Admitting: Interventional Cardiology

## 2011-10-14 DIAGNOSIS — G4730 Sleep apnea, unspecified: Secondary | ICD-10-CM | POA: Insufficient documentation

## 2011-10-14 DIAGNOSIS — Z5189 Encounter for other specified aftercare: Secondary | ICD-10-CM | POA: Insufficient documentation

## 2011-10-14 DIAGNOSIS — Z7902 Long term (current) use of antithrombotics/antiplatelets: Secondary | ICD-10-CM | POA: Insufficient documentation

## 2011-10-14 DIAGNOSIS — I2542 Coronary artery dissection: Secondary | ICD-10-CM | POA: Insufficient documentation

## 2011-10-14 DIAGNOSIS — E669 Obesity, unspecified: Secondary | ICD-10-CM | POA: Insufficient documentation

## 2011-10-14 DIAGNOSIS — Z9861 Coronary angioplasty status: Secondary | ICD-10-CM | POA: Insufficient documentation

## 2011-10-14 DIAGNOSIS — I10 Essential (primary) hypertension: Secondary | ICD-10-CM | POA: Insufficient documentation

## 2011-10-14 DIAGNOSIS — E785 Hyperlipidemia, unspecified: Secondary | ICD-10-CM | POA: Insufficient documentation

## 2011-10-14 DIAGNOSIS — I252 Old myocardial infarction: Secondary | ICD-10-CM | POA: Insufficient documentation

## 2011-10-16 ENCOUNTER — Encounter (HOSPITAL_COMMUNITY)
Admission: RE | Admit: 2011-10-16 | Discharge: 2011-10-16 | Disposition: A | Discharge status: Home / Self Care | Payer: Self-pay | Source: Ambulatory Visit | Attending: Interventional Cardiology | Admitting: Interventional Cardiology

## 2011-10-18 ENCOUNTER — Encounter (HOSPITAL_COMMUNITY): Payer: Self-pay

## 2011-10-21 ENCOUNTER — Encounter (HOSPITAL_COMMUNITY)
Admission: RE | Admit: 2011-10-21 | Discharge: 2011-10-21 | Disposition: A | Discharge status: Home / Self Care | Payer: Self-pay | Source: Ambulatory Visit | Attending: Interventional Cardiology | Admitting: Interventional Cardiology

## 2011-10-23 ENCOUNTER — Encounter (HOSPITAL_COMMUNITY): Payer: Self-pay

## 2011-10-25 ENCOUNTER — Encounter (HOSPITAL_COMMUNITY): Payer: Self-pay

## 2011-10-28 ENCOUNTER — Encounter (HOSPITAL_COMMUNITY): Payer: Self-pay

## 2011-10-30 ENCOUNTER — Encounter (HOSPITAL_COMMUNITY): Payer: Self-pay

## 2011-11-01 ENCOUNTER — Encounter (HOSPITAL_COMMUNITY)
Admission: RE | Admit: 2011-11-01 | Discharge: 2011-11-01 | Disposition: A | Discharge status: Home / Self Care | Payer: Self-pay | Source: Ambulatory Visit | Attending: Interventional Cardiology | Admitting: Interventional Cardiology

## 2011-11-04 ENCOUNTER — Encounter (HOSPITAL_COMMUNITY)
Admission: RE | Admit: 2011-11-04 | Discharge: 2011-11-04 | Disposition: A | Discharge status: Home / Self Care | Payer: Self-pay | Source: Ambulatory Visit | Attending: Interventional Cardiology | Admitting: Interventional Cardiology

## 2011-11-06 ENCOUNTER — Encounter (HOSPITAL_COMMUNITY)
Admission: RE | Admit: 2011-11-06 | Discharge: 2011-11-06 | Disposition: A | Discharge status: Home / Self Care | Payer: Self-pay | Source: Ambulatory Visit | Attending: Interventional Cardiology | Admitting: Interventional Cardiology

## 2011-11-08 ENCOUNTER — Encounter (HOSPITAL_COMMUNITY): Payer: Self-pay

## 2011-11-08 NOTE — Progress Notes (Signed)
Pt graduated from cardiac rehab attending 32/36 exercise sessions and 10 education sessions.  Pt had excellent participation. Pt made positive lifestyle changes and should be commended for her efforts.  She plans to continue her efforts by coming to cardiac maintenance program. Pt did occasionally have exercise hypertension-highest BP-170/94.  Telemetry-NSR.  Pt did not have hospital admission during her cardiac rehab period.  Pt met her goals for improved fitness and weight loss.  Pt was a pleasure to work with, thank you for the referral.

## 2011-11-11 ENCOUNTER — Encounter (HOSPITAL_COMMUNITY)
Admission: RE | Admit: 2011-11-11 | Discharge: 2011-11-11 | Disposition: A | Discharge status: Home / Self Care | Payer: Self-pay | Source: Ambulatory Visit | Attending: Interventional Cardiology | Admitting: Interventional Cardiology

## 2011-11-11 DIAGNOSIS — I2542 Coronary artery dissection: Secondary | ICD-10-CM | POA: Insufficient documentation

## 2011-11-11 DIAGNOSIS — Z5189 Encounter for other specified aftercare: Secondary | ICD-10-CM | POA: Insufficient documentation

## 2011-11-11 DIAGNOSIS — Z7902 Long term (current) use of antithrombotics/antiplatelets: Secondary | ICD-10-CM | POA: Insufficient documentation

## 2011-11-11 DIAGNOSIS — Z9861 Coronary angioplasty status: Secondary | ICD-10-CM | POA: Insufficient documentation

## 2011-11-11 DIAGNOSIS — G4730 Sleep apnea, unspecified: Secondary | ICD-10-CM | POA: Insufficient documentation

## 2011-11-11 DIAGNOSIS — E669 Obesity, unspecified: Secondary | ICD-10-CM | POA: Insufficient documentation

## 2011-11-11 DIAGNOSIS — E785 Hyperlipidemia, unspecified: Secondary | ICD-10-CM | POA: Insufficient documentation

## 2011-11-11 DIAGNOSIS — I252 Old myocardial infarction: Secondary | ICD-10-CM | POA: Insufficient documentation

## 2011-11-11 DIAGNOSIS — I10 Essential (primary) hypertension: Secondary | ICD-10-CM | POA: Insufficient documentation

## 2011-11-13 ENCOUNTER — Encounter (HOSPITAL_COMMUNITY)
Admission: RE | Admit: 2011-11-13 | Discharge: 2011-11-13 | Disposition: A | Discharge status: Home / Self Care | Payer: Self-pay | Source: Ambulatory Visit | Attending: Interventional Cardiology | Admitting: Interventional Cardiology

## 2011-11-15 ENCOUNTER — Encounter (HOSPITAL_COMMUNITY)
Admission: RE | Admit: 2011-11-15 | Discharge: 2011-11-15 | Disposition: A | Discharge status: Home / Self Care | Payer: Self-pay | Source: Ambulatory Visit | Attending: Interventional Cardiology | Admitting: Interventional Cardiology

## 2011-11-18 ENCOUNTER — Encounter (HOSPITAL_COMMUNITY): Payer: Self-pay

## 2011-11-20 ENCOUNTER — Encounter (HOSPITAL_COMMUNITY)
Admission: RE | Admit: 2011-11-20 | Discharge: 2011-11-20 | Disposition: A | Discharge status: Home / Self Care | Payer: Self-pay | Source: Ambulatory Visit | Attending: Interventional Cardiology | Admitting: Interventional Cardiology

## 2011-11-22 ENCOUNTER — Encounter (HOSPITAL_COMMUNITY)
Admission: RE | Admit: 2011-11-22 | Discharge: 2011-11-22 | Disposition: A | Discharge status: Home / Self Care | Payer: Self-pay | Source: Ambulatory Visit | Attending: Interventional Cardiology | Admitting: Interventional Cardiology

## 2011-11-25 ENCOUNTER — Encounter (HOSPITAL_COMMUNITY)
Admission: RE | Admit: 2011-11-25 | Discharge: 2011-11-25 | Disposition: A | Discharge status: Home / Self Care | Payer: Self-pay | Source: Ambulatory Visit | Attending: Interventional Cardiology | Admitting: Interventional Cardiology

## 2011-11-27 ENCOUNTER — Encounter (HOSPITAL_COMMUNITY)
Admission: RE | Admit: 2011-11-27 | Discharge: 2011-11-27 | Disposition: A | Discharge status: Home / Self Care | Payer: Self-pay | Source: Ambulatory Visit | Attending: Interventional Cardiology | Admitting: Interventional Cardiology

## 2011-11-29 ENCOUNTER — Encounter (HOSPITAL_COMMUNITY)
Admission: RE | Admit: 2011-11-29 | Discharge: 2011-11-29 | Disposition: A | Discharge status: Home / Self Care | Payer: Self-pay | Source: Ambulatory Visit | Attending: Interventional Cardiology | Admitting: Interventional Cardiology

## 2011-12-02 ENCOUNTER — Encounter (HOSPITAL_COMMUNITY)
Admission: RE | Admit: 2011-12-02 | Discharge: 2011-12-02 | Disposition: A | Discharge status: Home / Self Care | Payer: Self-pay | Source: Ambulatory Visit | Attending: Interventional Cardiology | Admitting: Interventional Cardiology

## 2011-12-04 ENCOUNTER — Encounter (HOSPITAL_COMMUNITY)
Admission: RE | Admit: 2011-12-04 | Discharge: 2011-12-04 | Disposition: A | Discharge status: Home / Self Care | Payer: Self-pay | Source: Ambulatory Visit | Attending: Interventional Cardiology | Admitting: Interventional Cardiology

## 2011-12-06 ENCOUNTER — Encounter (HOSPITAL_COMMUNITY): Payer: Self-pay

## 2011-12-09 ENCOUNTER — Encounter (HOSPITAL_COMMUNITY)
Admission: RE | Admit: 2011-12-09 | Discharge: 2011-12-09 | Disposition: A | Discharge status: Home / Self Care | Payer: Self-pay | Source: Ambulatory Visit | Attending: Interventional Cardiology | Admitting: Interventional Cardiology

## 2011-12-11 ENCOUNTER — Encounter (HOSPITAL_COMMUNITY)
Admission: RE | Admit: 2011-12-11 | Discharge: 2011-12-11 | Disposition: A | Discharge status: Home / Self Care | Payer: Self-pay | Source: Ambulatory Visit | Attending: Interventional Cardiology | Admitting: Interventional Cardiology

## 2011-12-13 ENCOUNTER — Encounter (HOSPITAL_COMMUNITY)
Admission: RE | Admit: 2011-12-13 | Discharge: 2011-12-13 | Disposition: A | Discharge status: Home / Self Care | Payer: Self-pay | Source: Ambulatory Visit | Attending: Interventional Cardiology | Admitting: Interventional Cardiology

## 2011-12-13 DIAGNOSIS — E785 Hyperlipidemia, unspecified: Secondary | ICD-10-CM | POA: Insufficient documentation

## 2011-12-13 DIAGNOSIS — G4730 Sleep apnea, unspecified: Secondary | ICD-10-CM | POA: Insufficient documentation

## 2011-12-13 DIAGNOSIS — Z5189 Encounter for other specified aftercare: Secondary | ICD-10-CM | POA: Insufficient documentation

## 2011-12-13 DIAGNOSIS — I252 Old myocardial infarction: Secondary | ICD-10-CM | POA: Insufficient documentation

## 2011-12-13 DIAGNOSIS — Z7902 Long term (current) use of antithrombotics/antiplatelets: Secondary | ICD-10-CM | POA: Insufficient documentation

## 2011-12-13 DIAGNOSIS — I2542 Coronary artery dissection: Secondary | ICD-10-CM | POA: Insufficient documentation

## 2011-12-13 DIAGNOSIS — E669 Obesity, unspecified: Secondary | ICD-10-CM | POA: Insufficient documentation

## 2011-12-13 DIAGNOSIS — Z9861 Coronary angioplasty status: Secondary | ICD-10-CM | POA: Insufficient documentation

## 2011-12-13 DIAGNOSIS — I10 Essential (primary) hypertension: Secondary | ICD-10-CM | POA: Insufficient documentation

## 2011-12-16 ENCOUNTER — Encounter (HOSPITAL_COMMUNITY)
Admission: RE | Admit: 2011-12-16 | Discharge: 2011-12-16 | Disposition: A | Discharge status: Home / Self Care | Payer: Self-pay | Source: Ambulatory Visit | Attending: Interventional Cardiology | Admitting: Interventional Cardiology

## 2011-12-18 ENCOUNTER — Encounter (HOSPITAL_COMMUNITY): Payer: Self-pay

## 2011-12-20 ENCOUNTER — Encounter (HOSPITAL_COMMUNITY)
Admission: RE | Admit: 2011-12-20 | Discharge: 2011-12-20 | Disposition: A | Discharge status: Home / Self Care | Payer: Self-pay | Source: Ambulatory Visit | Attending: Interventional Cardiology | Admitting: Interventional Cardiology

## 2011-12-23 ENCOUNTER — Encounter (HOSPITAL_COMMUNITY): Payer: Self-pay

## 2011-12-25 ENCOUNTER — Encounter (HOSPITAL_COMMUNITY): Payer: Self-pay

## 2011-12-27 ENCOUNTER — Encounter (HOSPITAL_COMMUNITY): Admission: RE | Admit: 2011-12-27 | Payer: Self-pay | Source: Ambulatory Visit

## 2011-12-30 ENCOUNTER — Encounter (HOSPITAL_COMMUNITY): Payer: Self-pay

## 2012-01-01 ENCOUNTER — Encounter (HOSPITAL_COMMUNITY)
Admission: RE | Admit: 2012-01-01 | Discharge: 2012-01-01 | Disposition: A | Discharge status: Home / Self Care | Payer: Self-pay | Source: Ambulatory Visit | Attending: Interventional Cardiology | Admitting: Interventional Cardiology

## 2012-01-03 ENCOUNTER — Encounter (HOSPITAL_COMMUNITY): Payer: Self-pay

## 2012-01-06 ENCOUNTER — Encounter (HOSPITAL_COMMUNITY)
Admission: RE | Admit: 2012-01-06 | Discharge: 2012-01-06 | Disposition: A | Discharge status: Home / Self Care | Payer: Self-pay | Source: Ambulatory Visit | Attending: Interventional Cardiology | Admitting: Interventional Cardiology

## 2012-01-08 ENCOUNTER — Encounter (HOSPITAL_COMMUNITY)
Admission: RE | Admit: 2012-01-08 | Discharge: 2012-01-08 | Disposition: A | Discharge status: Home / Self Care | Payer: Self-pay | Source: Ambulatory Visit | Attending: Interventional Cardiology | Admitting: Interventional Cardiology

## 2012-01-10 ENCOUNTER — Encounter (HOSPITAL_COMMUNITY)
Admission: RE | Admit: 2012-01-10 | Discharge: 2012-01-10 | Disposition: A | Discharge status: Home / Self Care | Payer: Self-pay | Source: Ambulatory Visit | Attending: Interventional Cardiology | Admitting: Interventional Cardiology

## 2012-01-15 ENCOUNTER — Encounter (HOSPITAL_COMMUNITY)
Admission: RE | Admit: 2012-01-15 | Discharge: 2012-01-15 | Disposition: A | Discharge status: Home / Self Care | Payer: Self-pay | Source: Ambulatory Visit | Attending: Interventional Cardiology | Admitting: Interventional Cardiology

## 2012-01-15 DIAGNOSIS — G4730 Sleep apnea, unspecified: Secondary | ICD-10-CM | POA: Insufficient documentation

## 2012-01-15 DIAGNOSIS — Z9861 Coronary angioplasty status: Secondary | ICD-10-CM | POA: Insufficient documentation

## 2012-01-15 DIAGNOSIS — I252 Old myocardial infarction: Secondary | ICD-10-CM | POA: Insufficient documentation

## 2012-01-15 DIAGNOSIS — E785 Hyperlipidemia, unspecified: Secondary | ICD-10-CM | POA: Insufficient documentation

## 2012-01-15 DIAGNOSIS — I2542 Coronary artery dissection: Secondary | ICD-10-CM | POA: Insufficient documentation

## 2012-01-15 DIAGNOSIS — Z5189 Encounter for other specified aftercare: Secondary | ICD-10-CM | POA: Insufficient documentation

## 2012-01-15 DIAGNOSIS — Z7902 Long term (current) use of antithrombotics/antiplatelets: Secondary | ICD-10-CM | POA: Insufficient documentation

## 2012-01-15 DIAGNOSIS — I10 Essential (primary) hypertension: Secondary | ICD-10-CM | POA: Insufficient documentation

## 2012-01-15 DIAGNOSIS — E669 Obesity, unspecified: Secondary | ICD-10-CM | POA: Insufficient documentation

## 2012-01-17 ENCOUNTER — Encounter (HOSPITAL_COMMUNITY)
Admission: RE | Admit: 2012-01-17 | Discharge: 2012-01-17 | Disposition: A | Discharge status: Home / Self Care | Payer: Self-pay | Source: Ambulatory Visit | Attending: Interventional Cardiology | Admitting: Interventional Cardiology

## 2012-01-20 ENCOUNTER — Encounter (HOSPITAL_COMMUNITY)
Admission: RE | Admit: 2012-01-20 | Discharge: 2012-01-20 | Disposition: A | Discharge status: Home / Self Care | Payer: Self-pay | Source: Ambulatory Visit | Attending: Interventional Cardiology | Admitting: Interventional Cardiology

## 2012-01-22 ENCOUNTER — Encounter (HOSPITAL_COMMUNITY)
Admission: RE | Admit: 2012-01-22 | Discharge: 2012-01-22 | Disposition: A | Discharge status: Home / Self Care | Payer: Self-pay | Source: Ambulatory Visit | Attending: Interventional Cardiology | Admitting: Interventional Cardiology

## 2012-01-24 ENCOUNTER — Encounter (HOSPITAL_COMMUNITY)
Admission: RE | Admit: 2012-01-24 | Discharge: 2012-01-24 | Disposition: A | Discharge status: Home / Self Care | Payer: Self-pay | Source: Ambulatory Visit | Attending: Interventional Cardiology | Admitting: Interventional Cardiology

## 2012-01-27 ENCOUNTER — Encounter (HOSPITAL_COMMUNITY)
Admission: RE | Admit: 2012-01-27 | Discharge: 2012-01-27 | Disposition: A | Discharge status: Home / Self Care | Payer: Self-pay | Source: Ambulatory Visit | Attending: Interventional Cardiology | Admitting: Interventional Cardiology

## 2012-01-29 ENCOUNTER — Encounter (HOSPITAL_COMMUNITY)
Admission: RE | Admit: 2012-01-29 | Discharge: 2012-01-29 | Disposition: A | Discharge status: Home / Self Care | Payer: Self-pay | Source: Ambulatory Visit | Attending: Interventional Cardiology | Admitting: Interventional Cardiology

## 2012-01-31 ENCOUNTER — Encounter (HOSPITAL_COMMUNITY)
Admission: RE | Admit: 2012-01-31 | Discharge: 2012-01-31 | Disposition: A | Discharge status: Home / Self Care | Payer: Self-pay | Source: Ambulatory Visit | Attending: Interventional Cardiology | Admitting: Interventional Cardiology

## 2012-02-03 ENCOUNTER — Encounter (HOSPITAL_COMMUNITY)
Admission: RE | Admit: 2012-02-03 | Discharge: 2012-02-03 | Disposition: A | Discharge status: Home / Self Care | Payer: Self-pay | Source: Ambulatory Visit | Attending: Interventional Cardiology | Admitting: Interventional Cardiology

## 2012-02-05 ENCOUNTER — Encounter (HOSPITAL_COMMUNITY): Payer: Self-pay

## 2012-02-07 ENCOUNTER — Encounter (HOSPITAL_COMMUNITY)
Admission: RE | Admit: 2012-02-07 | Discharge: 2012-02-07 | Disposition: A | Discharge status: Home / Self Care | Payer: Self-pay | Source: Ambulatory Visit | Attending: Interventional Cardiology | Admitting: Interventional Cardiology

## 2012-02-10 ENCOUNTER — Encounter (HOSPITAL_COMMUNITY)
Admission: RE | Admit: 2012-02-10 | Discharge: 2012-02-10 | Disposition: A | Discharge status: Home / Self Care | Payer: Self-pay | Source: Ambulatory Visit | Attending: Interventional Cardiology | Admitting: Interventional Cardiology

## 2012-02-12 ENCOUNTER — Encounter (HOSPITAL_COMMUNITY)
Admission: RE | Admit: 2012-02-12 | Discharge: 2012-02-12 | Disposition: A | Discharge status: Home / Self Care | Payer: Self-pay | Source: Ambulatory Visit | Attending: Interventional Cardiology | Admitting: Interventional Cardiology

## 2012-02-12 DIAGNOSIS — G4730 Sleep apnea, unspecified: Secondary | ICD-10-CM | POA: Insufficient documentation

## 2012-02-12 DIAGNOSIS — I10 Essential (primary) hypertension: Secondary | ICD-10-CM | POA: Insufficient documentation

## 2012-02-12 DIAGNOSIS — Z9861 Coronary angioplasty status: Secondary | ICD-10-CM | POA: Insufficient documentation

## 2012-02-12 DIAGNOSIS — Z5189 Encounter for other specified aftercare: Secondary | ICD-10-CM | POA: Insufficient documentation

## 2012-02-12 DIAGNOSIS — E785 Hyperlipidemia, unspecified: Secondary | ICD-10-CM | POA: Insufficient documentation

## 2012-02-12 DIAGNOSIS — I2542 Coronary artery dissection: Secondary | ICD-10-CM | POA: Insufficient documentation

## 2012-02-12 DIAGNOSIS — Z7902 Long term (current) use of antithrombotics/antiplatelets: Secondary | ICD-10-CM | POA: Insufficient documentation

## 2012-02-12 DIAGNOSIS — I252 Old myocardial infarction: Secondary | ICD-10-CM | POA: Insufficient documentation

## 2012-02-12 DIAGNOSIS — E669 Obesity, unspecified: Secondary | ICD-10-CM | POA: Insufficient documentation

## 2012-02-14 ENCOUNTER — Encounter (HOSPITAL_COMMUNITY)
Admission: RE | Admit: 2012-02-14 | Discharge: 2012-02-14 | Disposition: A | Discharge status: Home / Self Care | Payer: Self-pay | Source: Ambulatory Visit | Attending: Interventional Cardiology | Admitting: Interventional Cardiology

## 2012-02-17 ENCOUNTER — Encounter (HOSPITAL_COMMUNITY)
Admission: RE | Admit: 2012-02-17 | Discharge: 2012-02-17 | Disposition: A | Discharge status: Home / Self Care | Payer: Self-pay | Source: Ambulatory Visit | Attending: Interventional Cardiology | Admitting: Interventional Cardiology

## 2012-02-19 ENCOUNTER — Encounter (HOSPITAL_COMMUNITY)
Admission: RE | Admit: 2012-02-19 | Discharge: 2012-02-19 | Disposition: A | Discharge status: Home / Self Care | Payer: Self-pay | Source: Ambulatory Visit | Attending: Interventional Cardiology | Admitting: Interventional Cardiology

## 2012-02-21 ENCOUNTER — Encounter (HOSPITAL_COMMUNITY)
Admission: RE | Admit: 2012-02-21 | Discharge: 2012-02-21 | Disposition: A | Discharge status: Home / Self Care | Payer: Self-pay | Source: Ambulatory Visit | Attending: Interventional Cardiology | Admitting: Interventional Cardiology

## 2012-02-24 ENCOUNTER — Encounter (HOSPITAL_COMMUNITY)
Admission: RE | Admit: 2012-02-24 | Discharge: 2012-02-24 | Disposition: A | Discharge status: Home / Self Care | Payer: Self-pay | Source: Ambulatory Visit | Attending: Interventional Cardiology | Admitting: Interventional Cardiology

## 2012-02-26 ENCOUNTER — Encounter (HOSPITAL_COMMUNITY): Payer: Self-pay

## 2012-02-28 ENCOUNTER — Encounter (HOSPITAL_COMMUNITY)
Admission: RE | Admit: 2012-02-28 | Discharge: 2012-02-28 | Disposition: A | Discharge status: Home / Self Care | Payer: Self-pay | Source: Ambulatory Visit | Attending: Interventional Cardiology | Admitting: Interventional Cardiology

## 2012-03-02 ENCOUNTER — Encounter (HOSPITAL_COMMUNITY)
Admission: RE | Admit: 2012-03-02 | Discharge: 2012-03-02 | Disposition: A | Discharge status: Home / Self Care | Payer: Self-pay | Source: Ambulatory Visit | Attending: Interventional Cardiology | Admitting: Interventional Cardiology

## 2012-03-04 ENCOUNTER — Encounter (HOSPITAL_COMMUNITY)
Admission: RE | Admit: 2012-03-04 | Discharge: 2012-03-04 | Disposition: A | Discharge status: Home / Self Care | Payer: Self-pay | Source: Ambulatory Visit | Attending: Interventional Cardiology | Admitting: Interventional Cardiology

## 2012-03-05 ENCOUNTER — Other Ambulatory Visit: Payer: Self-pay | Admitting: Orthopedic Surgery

## 2012-03-05 MED ORDER — DEXAMETHASONE SODIUM PHOSPHATE 10 MG/ML IJ SOLN: 10.0000 mg | Freq: Once | INTRAMUSCULAR | Status: DC

## 2012-03-05 MED ORDER — BUPIVACAINE 0.25 % ON-Q PUMP SINGLE CATH 300ML: 300.0000 mL | INJECTION | Status: DC

## 2012-03-05 NOTE — Progress Notes (Signed)
Preoperative surgical orders have been place into the Epic hospital system for Isabella [name] on 03/05/2012, 1:29 PM  by Mickel Crow for surgery on 03/30/12.  Preop Total Knee orders including Bupivacaine On-Q pump, IV Tylenol, and IV Decadron as long as there are no contraindications to the above medications. Arlee Muslim, PA-C

## 2012-03-06 ENCOUNTER — Encounter (HOSPITAL_COMMUNITY)
Admission: RE | Admit: 2012-03-06 | Discharge: 2012-03-06 | Disposition: A | Discharge status: Home / Self Care | Payer: Self-pay | Source: Ambulatory Visit | Attending: Interventional Cardiology | Admitting: Interventional Cardiology

## 2012-03-09 ENCOUNTER — Encounter (HOSPITAL_COMMUNITY)
Admission: RE | Admit: 2012-03-09 | Discharge: 2012-03-09 | Disposition: A | Discharge status: Home / Self Care | Payer: Self-pay | Source: Ambulatory Visit | Attending: Interventional Cardiology | Admitting: Interventional Cardiology

## 2012-03-11 ENCOUNTER — Encounter (HOSPITAL_COMMUNITY)
Admission: RE | Admit: 2012-03-11 | Discharge: 2012-03-11 | Disposition: A | Discharge status: Home / Self Care | Payer: Self-pay | Source: Ambulatory Visit | Attending: Interventional Cardiology | Admitting: Interventional Cardiology

## 2012-03-13 ENCOUNTER — Encounter (HOSPITAL_COMMUNITY): Payer: Medicare Other

## 2012-03-16 ENCOUNTER — Encounter (HOSPITAL_COMMUNITY): Payer: Medicare Other

## 2012-03-18 ENCOUNTER — Encounter (HOSPITAL_COMMUNITY): Payer: Medicare Other

## 2012-03-20 ENCOUNTER — Encounter (HOSPITAL_COMMUNITY): Payer: Self-pay

## 2012-03-20 ENCOUNTER — Encounter (HOSPITAL_COMMUNITY): Payer: Medicare Other

## 2012-03-23 ENCOUNTER — Encounter (HOSPITAL_COMMUNITY): Payer: Medicare Other

## 2012-03-24 ENCOUNTER — Ambulatory Visit (HOSPITAL_COMMUNITY)
Admission: RE | Admit: 2012-03-24 | Discharge: 2012-03-24 | Disposition: A | Discharge status: Home / Self Care | Payer: Medicare Other | Source: Ambulatory Visit | Attending: Orthopedic Surgery | Admitting: Orthopedic Surgery

## 2012-03-24 ENCOUNTER — Encounter (HOSPITAL_COMMUNITY): Payer: Self-pay

## 2012-03-24 ENCOUNTER — Encounter (HOSPITAL_COMMUNITY)
Admission: RE | Admit: 2012-03-24 | Discharge: 2012-03-24 | Disposition: A | Discharge status: Home / Self Care | Payer: Medicare Other | Source: Ambulatory Visit | Attending: Orthopedic Surgery | Admitting: Orthopedic Surgery

## 2012-03-24 DIAGNOSIS — Z01818 Encounter for other preprocedural examination: Secondary | ICD-10-CM | POA: Insufficient documentation

## 2012-03-24 DIAGNOSIS — I252 Old myocardial infarction: Secondary | ICD-10-CM | POA: Insufficient documentation

## 2012-03-24 DIAGNOSIS — I10 Essential (primary) hypertension: Secondary | ICD-10-CM | POA: Insufficient documentation

## 2012-03-24 LAB — URINE MICROSCOPIC-ADD ON

## 2012-03-24 LAB — CBC
HCT: 42.3 % (ref 36.0–46.0)
MCH: 31 pg (ref 26.0–34.0)
MCHC: 33.8 g/dL (ref 30.0–36.0)
MCV: 91.8 fL (ref 78.0–100.0)
Platelets: 249 10*3/uL (ref 150–400)
RDW: 13.3 % (ref 11.5–15.5)
WBC: 5.2 10*3/uL (ref 4.0–10.5)

## 2012-03-24 LAB — COMPREHENSIVE METABOLIC PANEL
AST: 22 U/L (ref 0–37)
Albumin: 3.6 g/dL (ref 3.5–5.2)
BUN: 14 mg/dL (ref 6–23)
Calcium: 9.4 mg/dL (ref 8.4–10.5)
Chloride: 100 mEq/L (ref 96–112)
Creatinine, Ser: 0.66 mg/dL (ref 0.50–1.10)
Total Bilirubin: 0.8 mg/dL (ref 0.3–1.2)
Total Protein: 6.7 g/dL (ref 6.0–8.3)

## 2012-03-24 LAB — URINALYSIS, ROUTINE W REFLEX MICROSCOPIC
Bilirubin Urine: NEGATIVE
Ketones, ur: NEGATIVE mg/dL
Nitrite: NEGATIVE
Protein, ur: NEGATIVE mg/dL
pH: 6.5 (ref 5.0–8.0)

## 2012-03-24 LAB — PROTIME-INR
INR: 1.1 (ref 0.00–1.49)
Prothrombin Time: 14.1 seconds (ref 11.6–15.2)

## 2012-03-24 LAB — SURGICAL PCR SCREEN: Staphylococcus aureus: POSITIVE — AB

## 2012-03-24 MED ORDER — CHLORHEXIDINE GLUCONATE 4 % EX LIQD
60.0000 mL | Freq: Once | CUTANEOUS | Status: DC
  Filled 2012-03-24: qty 60

## 2012-03-24 NOTE — Progress Notes (Signed)
EKG Dr. Daneen Schick done 03-23-2012 will be faxed to PST and CXR done today 03-24-2012 in Epic EKG from 03-23-2012 on the chart from Dr. Daneen Schick cardiologist

## 2012-03-24 NOTE — Pre-Procedure Instructions (Signed)
Isabella [name]  03/24/2012   Your procedure is scheduled on:  Monday March 30, 2012  Report to [facility] at  AM. 0600  Call this number if you have problems the morning of surgery: [phone]   Remember:   Do not drink liquids or  eat food:After Midnight. Sunday night March 29, 2012      Take these medicines the morning of surgery with A SIP OF WATER: Lopressor   Do not wear jewelry, make-up or nail polish.  Do not wear lotions, powders, or perfumes. You may wear deodorant.  Do not shave 48 hours prior to surgery. Men may shave face and neck.  Do not bring valuables to the hospital.  Contacts, dentures or bridgework may not be worn into surgery.  Leave suitcase in the car. After surgery it may be brought to your room.  For patients admitted to the hospital, checkout time is 11:00 AM the day of discharge.   Patients discharged the day of surgery will not be allowed to drive home.  Name and phone number of your driver: Isabella [name] spouse [phone]  See [facility] Preparing for surgery sheet.   Please read over the following fact sheets that you were given: MRSA Information,Blood transfusion.Incentive Spirometry

## 2012-03-25 ENCOUNTER — Other Ambulatory Visit (HOSPITAL_COMMUNITY): Payer: Self-pay | Admitting: Orthopedic Surgery

## 2012-03-25 ENCOUNTER — Encounter (HOSPITAL_COMMUNITY): Payer: Medicare Other

## 2012-03-25 DIAGNOSIS — R9389 Abnormal findings on diagnostic imaging of other specified body structures: Secondary | ICD-10-CM

## 2012-03-25 DIAGNOSIS — Z01818 Encounter for other preprocedural examination: Secondary | ICD-10-CM

## 2012-03-25 NOTE — Progress Notes (Addendum)
Scheduled for CT chest for abnormal CXR and abnormal urine is okay for surgery per Dr. Wynelle Link CT chest at [facility] [place] Thursday

## 2012-03-26 ENCOUNTER — Ambulatory Visit (HOSPITAL_COMMUNITY)
Admission: RE | Admit: 2012-03-26 | Discharge: 2012-03-26 | Disposition: A | Discharge status: Home / Self Care | Payer: Medicare Other | Source: Ambulatory Visit | Attending: Orthopedic Surgery | Admitting: Orthopedic Surgery

## 2012-03-26 DIAGNOSIS — R222 Localized swelling, mass and lump, trunk: Secondary | ICD-10-CM | POA: Insufficient documentation

## 2012-03-26 DIAGNOSIS — R9389 Abnormal findings on diagnostic imaging of other specified body structures: Secondary | ICD-10-CM

## 2012-03-26 DIAGNOSIS — Z01818 Encounter for other preprocedural examination: Secondary | ICD-10-CM | POA: Insufficient documentation

## 2012-03-26 DIAGNOSIS — R918 Other nonspecific abnormal finding of lung field: Secondary | ICD-10-CM | POA: Insufficient documentation

## 2012-03-26 MED ORDER — IOHEXOL 300 MG/ML  SOLN
80.0000 mL | Freq: Once | INTRAMUSCULAR | Status: AC | PRN
  Administered 2012-03-26: 80 mL via INTRAVENOUS

## 2012-03-27 ENCOUNTER — Encounter (HOSPITAL_COMMUNITY): Payer: Medicare Other

## 2012-03-30 ENCOUNTER — Encounter (HOSPITAL_COMMUNITY): Payer: Medicare Other

## 2012-03-30 ENCOUNTER — Encounter (HOSPITAL_COMMUNITY): Admission: RE | Payer: Self-pay | Source: Ambulatory Visit

## 2012-03-30 ENCOUNTER — Inpatient Hospital Stay (HOSPITAL_COMMUNITY): Admission: RE | Admit: 2012-03-30 | Payer: Medicare Other | Source: Ambulatory Visit | Admitting: Orthopedic Surgery

## 2012-03-30 LAB — TYPE AND SCREEN: ABO/RH(D): A POS

## 2012-03-30 SURGERY — ARTHROPLASTY, KNEE, TOTAL
Anesthesia: Choice | Site: Knee | Laterality: Right

## 2012-04-01 ENCOUNTER — Encounter (HOSPITAL_COMMUNITY): Payer: Medicare Other

## 2012-04-01 ENCOUNTER — Other Ambulatory Visit (HOSPITAL_COMMUNITY): Payer: Self-pay | Admitting: Orthopedic Surgery

## 2012-04-01 DIAGNOSIS — R222 Localized swelling, mass and lump, trunk: Secondary | ICD-10-CM

## 2012-04-02 ENCOUNTER — Other Ambulatory Visit: Payer: Self-pay

## 2012-04-02 ENCOUNTER — Encounter: Payer: Medicare Other | Admitting: Thoracic Surgery (Cardiothoracic Vascular Surgery)

## 2012-04-02 ENCOUNTER — Other Ambulatory Visit: Payer: Self-pay | Admitting: Thoracic Surgery (Cardiothoracic Vascular Surgery)

## 2012-04-02 ENCOUNTER — Encounter: Payer: Self-pay | Admitting: Thoracic Surgery (Cardiothoracic Vascular Surgery)

## 2012-04-02 ENCOUNTER — Institutional Professional Consult (permissible substitution) (INDEPENDENT_AMBULATORY_CARE_PROVIDER_SITE_OTHER): Payer: Medicare Other | Admitting: Thoracic Surgery (Cardiothoracic Vascular Surgery)

## 2012-04-02 VITALS — BP 142/81 | HR 71 | Resp 18 | Ht 62.0 in | Wt 209.0 lb

## 2012-04-02 DIAGNOSIS — D381 Neoplasm of uncertain behavior of trachea, bronchus and lung: Secondary | ICD-10-CM

## 2012-04-02 DIAGNOSIS — R222 Localized swelling, mass and lump, trunk: Secondary | ICD-10-CM

## 2012-04-02 DIAGNOSIS — R918 Other nonspecific abnormal finding of lung field: Secondary | ICD-10-CM

## 2012-04-02 NOTE — Progress Notes (Signed)
PCP is Marjorie Smolder, MD Referring Provider is Marjorie Smolder, MD  Chief Complaint  Patient presents with  . Lung Mass    eval for lung mass, left lower lobe mass    HPI: Isabella [name] is a 71 year old woman who presents with chief complaint of a lung mass.  Isabella [name] is a 71 year old nonsmoker who was being evaluated for a total knee replacement. Her preoperative chest x-ray showed a left lung nodule. She had a CT of the chest on 03/26/2012 which showed a lobulated mass in the left lower lobe and multiple other tiny lung nodules bilaterally, with the largest of those being about 1 cm in the right lower lobe.  She says that other than needing her knee replaced, she's been feeling well. She does have a history of sleep apnea, which has been stable recently. She does have an occasional cough. This is usually associated with nasal congestion. She has not had any hemoptysis. She says she is felt well. She has no recent fevers, chills, or sweats. She did have bronchitis back in August but that cleared up quickly with azithromycin. Her weight has been stable. She's not have any known exposures to birds, construction sites, and has not had any distant travel. She says her husband had pneumonia twice this year most recently in June.  Past Medical History  Diagnosis Date  . Hypertension   . High cholesterol   . Coronary artery disease   . Myocardial infarct 03/24/2011    Dr. [name]  . Sleep apnea     uses cpap    Past Surgical History  Procedure Date  . Joint replacement   . Femoral artery exploration 03/25/2011    Procedure: FEMORAL ARTERY EXPLORATION;  Surgeon: Elam Dutch, MD;  Location: [facility] OR;  Service: Vascular;  Laterality: Right;  Evacuation of Hematoma   . Tonsillectomy and adenoidectomy   . Cholecystectomy   . Abdominal hysterectomy   . Carpal tunnel release   . Hernia repair   . Cataract extraction   . Shoulder surgery   . Coronary stent placement 03/24/2011    Dr. [name]   . Cardiac catheterization 03/2011  . Total knee arthroplasty 07/2010    Left  . Eye surgery [phone]    corneal transplant  . Laser surgery left eye 2012    Family History  Problem Relation Age of Onset  . Hypertension Mother   . Cancer Father     Social History History  Substance Use Topics  . Smoking status: Never Smoker   . Smokeless tobacco: Never Used  . Alcohol Use: No    Current Outpatient Prescriptions  Medication Sig Dispense Refill  . aspirin 81 MG tablet Take 81 mg by mouth every morning.       Marland Kitchen atorvastatin (LIPITOR) 40 MG tablet Take 40 mg by mouth at bedtime.      . furosemide (LASIX) 40 MG tablet Take 40 mg by mouth daily.       . Multiple Vitamins-Minerals (MULTIVITAMIN WITH MINERALS) tablet Take 1 tablet by mouth daily.      . nitroGLYCERIN (NITROSTAT) 0.4 MG SL tablet Place 0.4 mg under the tongue as needed.      Marland Kitchen olmesartan (BENICAR) 40 MG tablet Take 40 mg by mouth every morning.       . metoprolol tartrate (LOPRESSOR) 25 MG tablet Take 1 tablet (25 mg total) by mouth 2 (two) times daily.  60 tablet  11    Allergies  Allergen Reactions  .  Codeine     Back hurts  . Neomycin Swelling  . Penicillins Hives  . Percocet (Oxycodone-Acetaminophen)     confusion  . Zestril (Lisinopril) Cough    Review of Systems  Constitutional: Negative.  Negative for fever, chills, diaphoresis and unexpected weight change.  HENT: Negative.   Respiratory: Positive for apnea.   Genitourinary: Negative.   Musculoskeletal: Positive for joint swelling (scheduled for R TKR).  Neurological: Negative.   Hematological: Bruises/bleeds easily (was on plavix).  All other systems reviewed and are negative.    BP 142/81  Pulse 71  Resp 18  Ht 5' 2"  (1.575 m)  Wt 209 lb (94.802 kg)  BMI 38.23 kg/m2  SpO2 96% Physical Exam  Vitals reviewed. Constitutional: She is oriented to person, place, and time.       obese  HENT:  Head: Normocephalic and atraumatic.  Eyes:  EOM are normal. Pupils are equal, round, and reactive to light.  Neck: Neck supple. No thyromegaly present.  Cardiovascular: Normal rate, regular rhythm, normal heart sounds and intact distal pulses.  Exam reveals no gallop and no friction rub.   No murmur heard. Pulmonary/Chest: Breath sounds normal. No respiratory distress. She has no wheezes. She has no rales.  Abdominal: Soft. There is no tenderness.  Musculoskeletal: She exhibits no edema.  Lymphadenopathy:    She has no cervical adenopathy.  Neurological: She is alert and oriented to person, place, and time. No cranial nerve deficit.  Skin: Skin is warm and dry.     Diagnostic Tests: CT chest 03/26/2012 *RADIOLOGY REPORT*  Clinical Data: Preop clearance. Abnormal chest radiograph on  03/24/2012. Vague opacity in the lower left chest was visualized  and CT recommended  CT CHEST WITH CONTRAST  Technique: Multidetector CT imaging of the chest was performed  following the standard protocol during bolus administration of  intravenous contrast.  Contrast: 72m OMNIPAQUE IOHEXOL 300 MG/ML SOLN area  Comparison: Chest radiograph 03/24/2012  Findings: Thyroid gland within normal limits. Normal caliber  thoracic aorta. Stent present in the right coronary artery. Heart  size is normal.  9 x 8 mm left hilar lymph node on image number 30. No enlarged  supraclavicular, mediastinal, or right hilar lymph nodes.  Negative for pleural or pericardial effusion.  Esophagus is slightly distended with air in several areas, but  otherwise unremarkable.  Lung windows do demonstrate significant abnormalities. In the left  lower lobe inferiorly is a lobulated and somewhat spiculated mass  that accounts for the abnormality seen on recent chest radiograph.  This mass measures approximately 2.9 x 2.1 x 3.5 cm (craniocaudal  by AP by transverse). The lateral margin of the mass abuts the  pleura. Directly anterior to this mass is a 10 x 6 mm left lower    nodule along the major fissure.  Tiny, 3 mm nodule left upper lobe near the apex on image number 12  on the lung windows. 5 mm nodule left upper lobe on image number  21. 2 mm nodule left upper lobe on image number 32. 5 mm nodule  left upper lobe on image number 34. Question 4 mm nodule left  lower lobe near the costophrenic angle on image number 54.  There are also multiple nodules in the right lung. The largest is  in the right lower lobe medially and measures 11 x 10 mm. In the  right middle lobe is a 4 mm nodule (image #38). In the right upper  lobe near the apex posteriorly is  a 5 mm nodule (image number 12.)  Additionally, there are multiple additional scattered tiny nodules  in the 2-4 mm range in all three lobes of the right lung.  No definite emphysematous changes appreciated in the lungs.  Incidental imaging of the liver demonstrates a 12 x 11 mm low  density lesion near the falciform ligament. In the caudate lobe is  a 13 x 14 mm low density lesion. In the right lobe of the liver is  a partially visualized at least 12 x 9 mm low density lesion. There  are also two sub-centimeter low density lesions in the right lobe  of the liver anteriorly. The spleen is normal in size and  enhancement. Both adrenal glands are within normal limits. The  stomach is unremarkable.  There are degenerative changes of the thoracic spine with prominent  anterior osteophyte formation. Vertebral bodies are normal in  height and alignment. No focal lytic or sclerotic lesions are  identified.  IMPRESSION:  1. Left lower lobe mass (2.9 x 2.1 x 3.5 cm) is highly suspicious  for malignancy. Primary bronchogenic carcinoma is the primary  consideration, with metastatic disease also a possibility. A 9 mm  left hilar lymph node is suspicious for metastatic involvement.  Multiple much smaller bilateral pulmonary nodules as described  above are suspicious for metastatic nodules. Further evaluation  with  PET CT is suggested.  2. Right coronary artery stent.  3. Several low density lesions within the liver. There is no  prior abdominal CT for comparison. While these may reflect benign  etiologies such as cysts and/or hemangiomas, metastatic involvement  cannot be excluded given the findings in the chest.    Impression: 71 year old woman with newly discovered left lower lobe mass. This is a complex lobulated mass. There are multiple other lung nodules scattered throughout both lungs. Most of these in 3-5 mm in size. However there is one in the right lower lobe that is approximately 1 cm in diameter. I had a long discussion with Mr. and Mrs. [place] regarding these findings and reviewed the films with them. She is a lifelong nonsmoker and otherwise a very low risk for lung cancer. Reviewed the differential diagnosis which includes infectious, inflammatory, and neoplastic causes. I would not be surprised and released this turned out to be infectious or inflammatory in origin, along the lines of MAI or fungal infection.  She was scheduled to have a PET CT done tomorrow.  In my opinion the best way to proceed with be with electromagnetic navigational bronchoscopy to access the left lower lobe mass and hopefully the right lower lobe one as well. This would be to obtain biopsies and cultures. I recommended that we proceed with that before we do a PET/CT as the PET/CT would not necessarily discriminate between infectious or inflammatory or neoplastic causes. I discussed in detail with her husband the nature of the procedure, need for general anesthesia, approach flexible fiberoptic bronchoscopy via endotracheal tube. They understand there is no guarantee that definitive diagnosis can be made in this way. They understand the risks of pneumothorax and bleeding as well as the risks associated with general anesthesia. She had been cleared by Dr. [name] for total knee replacement and her Plavix was discontinued when she  saw him 10 days ago.    Plan: Proceed with electromagnetic navigational bronchoscopy on Monday, 04/13/2012. We will plan to do this as an outpatient procedure.

## 2012-04-03 ENCOUNTER — Encounter (HOSPITAL_COMMUNITY): Payer: Medicare Other

## 2012-04-06 ENCOUNTER — Encounter (HOSPITAL_COMMUNITY): Payer: Self-pay | Admitting: Pharmacy Technician

## 2012-04-06 ENCOUNTER — Encounter (HOSPITAL_COMMUNITY): Payer: Medicare Other

## 2012-04-07 ENCOUNTER — Ambulatory Visit (HOSPITAL_COMMUNITY)
Admission: RE | Admit: 2012-04-07 | Discharge: 2012-04-07 | Disposition: A | Discharge status: Home / Self Care | Payer: Medicare Other | Source: Ambulatory Visit | Attending: Thoracic Surgery (Cardiothoracic Vascular Surgery) | Admitting: Thoracic Surgery (Cardiothoracic Vascular Surgery)

## 2012-04-07 DIAGNOSIS — D381 Neoplasm of uncertain behavior of trachea, bronchus and lung: Secondary | ICD-10-CM

## 2012-04-07 DIAGNOSIS — J984 Other disorders of lung: Secondary | ICD-10-CM | POA: Insufficient documentation

## 2012-04-07 LAB — PULMONARY FUNCTION TEST

## 2012-04-07 MED ORDER — ALBUTEROL SULFATE (5 MG/ML) 0.5% IN NEBU
2.5000 mg | INHALATION_SOLUTION | Freq: Once | RESPIRATORY_TRACT | Status: AC
  Administered 2012-04-07: 2.5 mg via RESPIRATORY_TRACT

## 2012-04-08 ENCOUNTER — Encounter (HOSPITAL_COMMUNITY): Payer: Medicare Other

## 2012-04-10 ENCOUNTER — Encounter (HOSPITAL_COMMUNITY): Payer: Self-pay | Admitting: *Deleted

## 2012-04-10 MED ORDER — ACETAMINOPHEN 10 MG/ML IV SOLN
1000.0000 mg | Freq: Once | INTRAVENOUS | Status: AC
  Filled 2012-04-10: qty 100

## 2012-04-10 MED ORDER — SODIUM CHLORIDE 0.9 % IV SOLN: INTRAVENOUS | Status: DC

## 2012-04-10 MED ORDER — DEXTROSE 5 % IV SOLN
3.0000 g | INTRAVENOUS | Status: DC
  Filled 2012-04-10: qty 3000

## 2012-04-13 ENCOUNTER — Ambulatory Visit (HOSPITAL_COMMUNITY): Payer: Medicare Other | Admitting: Certified Registered"

## 2012-04-13 ENCOUNTER — Encounter (HOSPITAL_COMMUNITY): Payer: Self-pay | Admitting: Certified Registered"

## 2012-04-13 ENCOUNTER — Encounter (HOSPITAL_COMMUNITY)
Admission: RE | Disposition: A | Discharge status: Home / Self Care | Payer: Self-pay | Source: Ambulatory Visit | Attending: Thoracic Surgery (Cardiothoracic Vascular Surgery)

## 2012-04-13 ENCOUNTER — Ambulatory Visit (HOSPITAL_COMMUNITY): Payer: Medicare Other

## 2012-04-13 ENCOUNTER — Ambulatory Visit (HOSPITAL_COMMUNITY)
Admission: RE | Admit: 2012-04-13 | Discharge: 2012-04-13 | Disposition: A | Discharge status: Home / Self Care | Payer: Medicare Other | Source: Ambulatory Visit | Attending: Thoracic Surgery (Cardiothoracic Vascular Surgery) | Admitting: Thoracic Surgery (Cardiothoracic Vascular Surgery)

## 2012-04-13 ENCOUNTER — Encounter (HOSPITAL_COMMUNITY): Payer: Medicare Other

## 2012-04-13 ENCOUNTER — Encounter (HOSPITAL_COMMUNITY): Payer: Self-pay | Admitting: *Deleted

## 2012-04-13 DIAGNOSIS — D381 Neoplasm of uncertain behavior of trachea, bronchus and lung: Secondary | ICD-10-CM

## 2012-04-13 DIAGNOSIS — Z7982 Long term (current) use of aspirin: Secondary | ICD-10-CM | POA: Insufficient documentation

## 2012-04-13 DIAGNOSIS — I252 Old myocardial infarction: Secondary | ICD-10-CM | POA: Insufficient documentation

## 2012-04-13 DIAGNOSIS — I2510 Atherosclerotic heart disease of native coronary artery without angina pectoris: Secondary | ICD-10-CM | POA: Insufficient documentation

## 2012-04-13 DIAGNOSIS — Z880 Allergy status to penicillin: Secondary | ICD-10-CM | POA: Insufficient documentation

## 2012-04-13 DIAGNOSIS — E7800 Pure hypercholesterolemia, unspecified: Secondary | ICD-10-CM | POA: Insufficient documentation

## 2012-04-13 DIAGNOSIS — Z809 Family history of malignant neoplasm, unspecified: Secondary | ICD-10-CM | POA: Insufficient documentation

## 2012-04-13 DIAGNOSIS — Z8249 Family history of ischemic heart disease and other diseases of the circulatory system: Secondary | ICD-10-CM | POA: Insufficient documentation

## 2012-04-13 DIAGNOSIS — Z885 Allergy status to narcotic agent status: Secondary | ICD-10-CM | POA: Insufficient documentation

## 2012-04-13 DIAGNOSIS — Z96659 Presence of unspecified artificial knee joint: Secondary | ICD-10-CM | POA: Insufficient documentation

## 2012-04-13 DIAGNOSIS — Z90710 Acquired absence of both cervix and uterus: Secondary | ICD-10-CM | POA: Insufficient documentation

## 2012-04-13 DIAGNOSIS — Z9089 Acquired absence of other organs: Secondary | ICD-10-CM | POA: Insufficient documentation

## 2012-04-13 DIAGNOSIS — I10 Essential (primary) hypertension: Secondary | ICD-10-CM | POA: Insufficient documentation

## 2012-04-13 DIAGNOSIS — G4730 Sleep apnea, unspecified: Secondary | ICD-10-CM | POA: Insufficient documentation

## 2012-04-13 DIAGNOSIS — R222 Localized swelling, mass and lump, trunk: Secondary | ICD-10-CM | POA: Insufficient documentation

## 2012-04-13 HISTORY — DX: Other complications of anesthesia, initial encounter: T88.59XA

## 2012-04-13 HISTORY — DX: Adverse effect of unspecified anesthetic, initial encounter: T41.45XA

## 2012-04-13 HISTORY — DX: Personal history of other medical treatment: Z92.89

## 2012-04-13 HISTORY — PX: VIDEO BRONCHOSCOPY WITH ENDOBRONCHIAL NAVIGATION: SHX6175

## 2012-04-13 LAB — PROTIME-INR: INR: 1.11 (ref 0.00–1.49)

## 2012-04-13 LAB — COMPREHENSIVE METABOLIC PANEL
ALT: 20 U/L (ref 0–35)
AST: 24 U/L (ref 0–37)
Albumin: 3.4 g/dL — ABNORMAL LOW (ref 3.5–5.2)
CO2: 29 mEq/L (ref 19–32)
Calcium: 9.1 mg/dL (ref 8.4–10.5)
Chloride: 104 mEq/L (ref 96–112)
Creatinine, Ser: 0.68 mg/dL (ref 0.50–1.10)
GFR calc non Af Amer: 87 mL/min — ABNORMAL LOW (ref >90)
Sodium: 143 mEq/L (ref 135–145)
Total Bilirubin: 0.4 mg/dL (ref 0.3–1.2)

## 2012-04-13 LAB — CBC
MCV: 91.2 fL (ref 78.0–100.0)
Platelets: 249 10*3/uL (ref 150–400)
RBC: 4.42 MIL/uL (ref 3.87–5.11)
RDW: 13.5 % (ref 11.5–15.5)
WBC: 5.9 10*3/uL (ref 4.0–10.5)

## 2012-04-13 SURGERY — VIDEO BRONCHOSCOPY WITH ENDOBRONCHIAL NAVIGATION
Anesthesia: General | Site: Chest | Wound class: Clean Contaminated

## 2012-04-13 MED ORDER — ACETAMINOPHEN 325 MG PO TABS: 650.0000 mg | ORAL_TABLET | ORAL | Status: DC | PRN

## 2012-04-13 MED ORDER — SODIUM CHLORIDE 0.9 % IV SOLN: 250.0000 mL | INTRAVENOUS | Status: DC | PRN

## 2012-04-13 MED ORDER — NEOSTIGMINE METHYLSULFATE 1 MG/ML IJ SOLN
INTRAMUSCULAR | Status: DC | PRN
  Administered 2012-04-13: 4 mg via INTRAVENOUS

## 2012-04-13 MED ORDER — HYDROMORPHONE HCL PF 1 MG/ML IJ SOLN: 0.2500 mg | INTRAMUSCULAR | Status: DC | PRN

## 2012-04-13 MED ORDER — LACTATED RINGERS IV SOLN
INTRAVENOUS | Status: DC | PRN
  Administered 2012-04-13 (×2): via INTRAVENOUS

## 2012-04-13 MED ORDER — ACETAMINOPHEN 10 MG/ML IV SOLN: 1000.0000 mg | Freq: Once | INTRAVENOUS | Status: DC | PRN

## 2012-04-13 MED ORDER — GLYCOPYRROLATE 0.2 MG/ML IJ SOLN
INTRAMUSCULAR | Status: DC | PRN
  Administered 2012-04-13: 0.6 mg via INTRAVENOUS

## 2012-04-13 MED ORDER — SODIUM CHLORIDE 0.9 % IJ SOLN: 3.0000 mL | INTRAMUSCULAR | Status: DC | PRN

## 2012-04-13 MED ORDER — ONDANSETRON HCL 4 MG/2ML IJ SOLN
INTRAMUSCULAR | Status: DC | PRN
  Administered 2012-04-13: 4 mg via INTRAVENOUS

## 2012-04-13 MED ORDER — SODIUM CHLORIDE 0.9 % IJ SOLN: 3.0000 mL | Freq: Two times a day (BID) | INTRAMUSCULAR | Status: DC

## 2012-04-13 MED ORDER — LIDOCAINE HCL 4 % MT SOLN
OROMUCOSAL | Status: DC | PRN
  Administered 2012-04-13: 4 mL via TOPICAL

## 2012-04-13 MED ORDER — ONDANSETRON HCL 4 MG/2ML IJ SOLN: 4.0000 mg | Freq: Once | INTRAMUSCULAR | Status: DC | PRN

## 2012-04-13 MED ORDER — LIDOCAINE HCL (CARDIAC) 20 MG/ML IV SOLN
INTRAVENOUS | Status: DC | PRN
  Administered 2012-04-13: 40 mg via INTRAVENOUS

## 2012-04-13 MED ORDER — EPHEDRINE SULFATE 50 MG/ML IJ SOLN
INTRAMUSCULAR | Status: DC | PRN
  Administered 2012-04-13: 5 mg via INTRAVENOUS

## 2012-04-13 MED ORDER — 0.9 % SODIUM CHLORIDE (POUR BTL) OPTIME
TOPICAL | Status: DC | PRN
  Administered 2012-04-13: 1000 mL

## 2012-04-13 MED ORDER — ROCURONIUM BROMIDE 100 MG/10ML IV SOLN
INTRAVENOUS | Status: DC | PRN
  Administered 2012-04-13: 40 mg via INTRAVENOUS

## 2012-04-13 MED ORDER — PROPOFOL 10 MG/ML IV BOLUS
INTRAVENOUS | Status: DC | PRN
  Administered 2012-04-13: 20 mg via INTRAVENOUS
  Administered 2012-04-13: 30 mg via INTRAVENOUS
  Administered 2012-04-13: 150 mg via INTRAVENOUS
  Administered 2012-04-13: 30 mg via INTRAVENOUS

## 2012-04-13 SURGICAL SUPPLY — 34 items
BRUSH SUPERTRAX BIOPSY (INSTRUMENTS) ×1 IMPLANT
BRUSH SUPERTRAX NDL-TIP CYTO (INSTRUMENTS) ×2 IMPLANT
CANISTER SUCTION 2500CC (MISCELLANEOUS) ×2 IMPLANT
CHANNEL WORK EXTEND EDGE 180 (KITS) IMPLANT
CHANNEL WORK EXTEND EDGE 45 (KITS) IMPLANT
CHANNEL WORK EXTEND EDGE 90 (KITS) IMPLANT
CLOTH BEACON ORANGE TIMEOUT ST (SAFETY) ×2 IMPLANT
CONT SPEC 4OZ CLIKSEAL STRL BL (MISCELLANEOUS) ×6 IMPLANT
COVER TABLE BACK 60X90 (DRAPES) ×2 IMPLANT
FILTER STRAW FLUID ASPIR (MISCELLANEOUS) IMPLANT
FORCEPS BIOP SUPERTRX PREMAR (INSTRUMENTS) IMPLANT
GLOVE BIOGEL PI IND STRL 6.5 (GLOVE) IMPLANT
GLOVE BIOGEL PI INDICATOR 6.5 (GLOVE) ×1
GLOVE SURG SIGNA 7.5 PF LTX (GLOVE) ×2 IMPLANT
GOWN PREVENTION PLUS XLARGE (GOWN DISPOSABLE) ×2 IMPLANT
KIT LOCATABLE GUIDE (CANNULA) IMPLANT
KIT MARKER FIDUCIAL DELIVERY (KITS) IMPLANT
KIT PROCEDURE EDGE 180 (KITS) IMPLANT
KIT PROCEDURE EDGE 45 (KITS) IMPLANT
KIT PROCEDURE EDGE 90 (KITS) IMPLANT
KIT ROOM TURNOVER OR (KITS) ×2 IMPLANT
MARKER SKIN DUAL TIP RULER LAB (MISCELLANEOUS) ×2 IMPLANT
NDL SUPERTRX PREMARK BIOPSY (NEEDLE) IMPLANT
NEEDLE SUPERTRX PREMARK BIOPSY (NEEDLE) ×2 IMPLANT
NS IRRIG 1000ML POUR BTL (IV SOLUTION) ×2 IMPLANT
OIL SILICONE PENTAX (PARTS (SERVICE/REPAIRS)) ×2 IMPLANT
PAD ARMBOARD 7.5X6 YLW CONV (MISCELLANEOUS) ×4 IMPLANT
PATCHES PATIENT (LABEL) ×2 IMPLANT
SPONGE GAUZE 4X4 12PLY (GAUZE/BANDAGES/DRESSINGS) ×2 IMPLANT
SYR 20ML ECCENTRIC (SYRINGE) ×2 IMPLANT
SYR 30ML LL (SYRINGE) ×2 IMPLANT
TOWEL OR 17X24 6PK STRL BLUE (TOWEL DISPOSABLE) ×2 IMPLANT
TRAP SPECIMEN MUCOUS 40CC (MISCELLANEOUS) ×2 IMPLANT
TUBE CONNECTING 12X1/4 (SUCTIONS) ×2 IMPLANT

## 2012-04-13 NOTE — Anesthesia Procedure Notes (Signed)
Procedure Name: Intubation Date/Time: 04/13/2012 10:22 AM Performed by: Manuela Schwartz B Pre-anesthesia Checklist: Patient identified, Emergency Drugs available, Suction available, Patient being monitored and Timeout performed Patient Re-evaluated:Patient Re-evaluated prior to inductionOxygen Delivery Method: Circle system utilized Preoxygenation: Pre-oxygenation with 100% oxygen Intubation Type: IV induction Ventilation: Mask ventilation without difficulty Laryngoscope Size: Mac and 3 Grade View: Grade I Tube type: Oral Tube size: 8.5 mm Number of attempts: 1 Airway Equipment and Method: Stylet and LTA kit utilized Placement Confirmation: ETT inserted through vocal cords under direct vision,  positive ETCO2 and breath sounds checked- equal and bilateral Secured at: 21 cm Tube secured with: Tape Dental Injury: Teeth and Oropharynx as per pre-operative assessment

## 2012-04-13 NOTE — Anesthesia Preprocedure Evaluation (Addendum)
Anesthesia Evaluation  Patient identified by MRN, date of birth, ID band Patient awake    Reviewed: Allergy & Precautions, H&P , NPO status , Patient's Chart, lab work & pertinent test results, reviewed documented beta blocker date and time   History of Anesthesia Complications Negative for: history of anesthetic complications  Airway Mallampati: II TM Distance: >3 FB Neck ROM: Full    Dental  (+) Caps, Teeth Intact and Dental Advisory Given   Pulmonary sleep apnea and Continuous Positive Airway Pressure Ventilation ,  breath sounds clear to auscultation        Cardiovascular hypertension, Pt. on medications and Pt. on home beta blockers + CAD, + Past MI and + Cardiac Stents Rhythm:Regular Rate:Normal     Neuro/Psych    GI/Hepatic   Endo/Other    Renal/GU      Musculoskeletal   Abdominal (+) + obese,   Peds  Hematology   Anesthesia Other Findings   Reproductive/Obstetrics                          Anesthesia Physical Anesthesia Plan  ASA: II  Anesthesia Plan: General   Post-op Pain Management:    Induction: Intravenous  Airway Management Planned: Oral ETT  Additional Equipment:   Intra-op Plan:   Post-operative Plan: Extubation in OR  Informed Consent:   Dental advisory given  Plan Discussed with: Anesthesiologist, Surgeon and CRNA  Anesthesia Plan Comments: (L. Lung mass asymptomatic CAD S/P Non-stemi 03/24/11 treated with RCA stent no angina, nl EF Htn   Plan GA   Roberts Gaudy, MD)      Anesthesia Quick Evaluation

## 2012-04-13 NOTE — Anesthesia Postprocedure Evaluation (Signed)
  Anesthesia Post-op Note  Patient: Isabella [name]  Procedure(s) Performed: Procedure(s) (LRB) with comments: VIDEO BRONCHOSCOPY WITH ENDOBRONCHIAL NAVIGATION (N/A)  Patient Location: PACU  Anesthesia Type:General  Level of Consciousness: awake, alert  and oriented  Airway and Oxygen Therapy: Patient Spontanous Breathing and Patient connected to nasal cannula oxygen  Post-op Pain: mild  Post-op Assessment: Post-op Vital signs reviewed and Patient's Cardiovascular Status Stable  Post-op Vital Signs: stable  Complications: No apparent anesthesia complications

## 2012-04-13 NOTE — Transfer of Care (Signed)
Immediate Anesthesia Transfer of Care Note  Patient: Isabella [name] [facility]  Procedure(s) Performed: Procedure(s) (LRB) with comments: VIDEO BRONCHOSCOPY WITH ENDOBRONCHIAL NAVIGATION (N/A)  Patient Location: PACU  Anesthesia Type:General  Level of Consciousness: awake, oriented and patient cooperative  Airway & Oxygen Therapy: Patient Spontanous Breathing and Patient connected to face mask oxygen  Post-op Assessment: Report given to PACU RN and Post -op Vital signs reviewed and stable  Post vital signs: Reviewed and stable  Complications: No apparent anesthesia complications

## 2012-04-13 NOTE — H&P (View-Only) (Signed)
PCP is Isabella Smolder, MD Referring Provider is Isabella Smolder, MD  Chief Complaint  Patient presents with  . Lung Mass    eval for lung mass, left lower lobe mass    HPI: [name] is a 71 year old woman who presents with chief complaint of a lung mass.  [name] is a 71 year old nonsmoker who was being evaluated for a total knee replacement. Her preoperative chest x-ray showed a left lung nodule. She had a CT of the chest on 03/26/2012 which showed a lobulated mass in the left lower lobe and multiple other tiny lung nodules bilaterally, with the largest of those being about 1 cm in the right lower lobe.  She says that other than needing her knee replaced, she's been feeling well. She does have a history of sleep apnea, which has been stable recently. She does have an occasional cough. This is usually associated with nasal congestion. She has not had any hemoptysis. She says she is felt well. She has no recent fevers, chills, or sweats. She did have bronchitis back in August but that cleared up quickly with azithromycin. Her weight has been stable. She's not have any known exposures to birds, construction sites, and has not had any distant travel. She says her husband had pneumonia twice this year most recently in June.  Past Medical History  Diagnosis Date  . Hypertension   . High cholesterol   . Coronary artery disease   . Myocardial infarct 03/24/2011    Dr. Tamala [name]  . Sleep apnea     uses cpap    Past Surgical History  Procedure Date  . Joint replacement   . Femoral artery exploration 03/25/2011    Procedure: FEMORAL ARTERY EXPLORATION;  Surgeon: Isabella Dutch, MD;  Location: [name] OR;  Service: Vascular;  Laterality: Right;  Evacuation of Hematoma   . Tonsillectomy and adenoidectomy   . Cholecystectomy   . Abdominal hysterectomy   . Carpal tunnel release   . Hernia repair   . Cataract extraction   . Shoulder surgery   . Coronary stent placement 03/24/2011    Dr. Tamala [name]   . Cardiac catheterization 03/2011  . Total knee arthroplasty 07/2010    Left  . Eye surgery [phone]    corneal transplant  . Laser surgery left eye 2012    Family History  Problem Relation Age of Onset  . Hypertension Mother   . Cancer Father     Social History History  Substance Use Topics  . Smoking status: Never Smoker   . Smokeless tobacco: Never Used  . Alcohol Use: No    Current Outpatient Prescriptions  Medication Sig Dispense Refill  . aspirin 81 MG tablet Take 81 mg by mouth every morning.       Marland Kitchen atorvastatin (LIPITOR) 40 MG tablet Take 40 mg by mouth at bedtime.      . furosemide (LASIX) 40 MG tablet Take 40 mg by mouth daily.       . Multiple Vitamins-Minerals (MULTIVITAMIN WITH MINERALS) tablet Take 1 tablet by mouth daily.      . nitroGLYCERIN (NITROSTAT) 0.4 MG SL tablet Place 0.4 mg under the tongue as needed.      Marland Kitchen olmesartan (BENICAR) 40 MG tablet Take 40 mg by mouth every morning.       . metoprolol tartrate (LOPRESSOR) 25 MG tablet Take 1 tablet (25 mg total) by mouth 2 (two) times daily.  60 tablet  11    Allergies  Allergen Reactions  .  Codeine     Back hurts  . Neomycin Swelling  . Penicillins Hives  . Percocet (Oxycodone-Acetaminophen)     confusion  . Zestril (Lisinopril) Cough    Review of Systems  Constitutional: Negative.  Negative for fever, chills, diaphoresis and unexpected weight change.  HENT: Negative.   Respiratory: Positive for apnea.   Genitourinary: Negative.   Musculoskeletal: Positive for joint swelling (scheduled for R TKR).  Neurological: Negative.   Hematological: Bruises/bleeds easily (was on plavix).  All other systems reviewed and are negative.    BP 142/81  Pulse 71  Resp 18  Ht 5' 2"  (1.575 m)  Wt 209 lb (94.802 kg)  BMI 38.23 kg/m2  SpO2 96% Physical Exam  Vitals reviewed. Constitutional: She is oriented to person, place, and time.       obese  HENT:  Head: Normocephalic and atraumatic.  Eyes:  EOM are normal. Pupils are equal, round, and reactive to light.  Neck: Neck supple. No thyromegaly present.  Cardiovascular: Normal rate, regular rhythm, normal heart sounds and intact distal pulses.  Exam reveals no gallop and no friction rub.   No murmur heard. Pulmonary/Chest: Breath sounds normal. No respiratory distress. She has no wheezes. She has no rales.  Abdominal: Soft. There is no tenderness.  Musculoskeletal: She exhibits no edema.  Lymphadenopathy:    She has no cervical adenopathy.  Neurological: She is alert and oriented to person, place, and time. No cranial nerve deficit.  Skin: Skin is warm and dry.     Diagnostic Tests: CT chest 03/26/2012 *RADIOLOGY REPORT*  Clinical Data: Preop clearance. Abnormal chest radiograph on  03/24/2012. Vague opacity in the lower left chest was visualized  and CT recommended  CT CHEST WITH CONTRAST  Technique: Multidetector CT imaging of the chest was performed  following the standard protocol during bolus administration of  intravenous contrast.  Contrast: 59m OMNIPAQUE IOHEXOL 300 MG/ML SOLN area  Comparison: Chest radiograph 03/24/2012  Findings: Thyroid gland within normal limits. Normal caliber  thoracic aorta. Stent present in the right coronary artery. Heart  size is normal.  9 x 8 mm left hilar lymph node on image number 30. No enlarged  supraclavicular, mediastinal, or right hilar lymph nodes.  Negative for pleural or pericardial effusion.  Esophagus is slightly distended with air in several areas, but  otherwise unremarkable.  Lung windows do demonstrate significant abnormalities. In the left  lower lobe inferiorly is a lobulated and somewhat spiculated mass  that accounts for the abnormality seen on recent chest radiograph.  This mass measures approximately 2.9 x 2.1 x 3.5 cm (craniocaudal  by AP by transverse). The lateral margin of the mass abuts the  pleura. Directly anterior to this mass is a 10 x 6 mm left lower    nodule along the major fissure.  Tiny, 3 mm nodule left upper lobe near the apex on image number 12  on the lung windows. 5 mm nodule left upper lobe on image number  21. 2 mm nodule left upper lobe on image number 32. 5 mm nodule  left upper lobe on image number 34. Question 4 mm nodule left  lower lobe near the costophrenic angle on image number 54.  There are also multiple nodules in the right lung. The largest is  in the right lower lobe medially and measures 11 x 10 mm. In the  right middle lobe is a 4 mm nodule (image #38). In the right upper  lobe near the apex posteriorly is  a 5 mm nodule (image number 12.)  Additionally, there are multiple additional scattered tiny nodules  in the 2-4 mm range in all three lobes of the right lung.  No definite emphysematous changes appreciated in the lungs.  Incidental imaging of the liver demonstrates a 12 x 11 mm low  density lesion near the falciform ligament. In the caudate lobe is  a 13 x 14 mm low density lesion. In the right lobe of the liver is  a partially visualized at least 12 x 9 mm low density lesion. There  are also two sub-centimeter low density lesions in the right lobe  of the liver anteriorly. The spleen is normal in size and  enhancement. Both adrenal glands are within normal limits. The  stomach is unremarkable.  There are degenerative changes of the thoracic spine with prominent  anterior osteophyte formation. Vertebral bodies are normal in  height and alignment. No focal lytic or sclerotic lesions are  identified.  IMPRESSION:  1. Left lower lobe mass (2.9 x 2.1 x 3.5 cm) is highly suspicious  for malignancy. Primary bronchogenic carcinoma is the primary  consideration, with metastatic disease also a possibility. A 9 mm  left hilar lymph node is suspicious for metastatic involvement.  Multiple much smaller bilateral pulmonary nodules as described  above are suspicious for metastatic nodules. Further evaluation  with  PET CT is suggested.  2. Right coronary artery stent.  3. Several low density lesions within the liver. There is no  prior abdominal CT for comparison. While these may reflect benign  etiologies such as cysts and/or hemangiomas, metastatic involvement  cannot be excluded given the findings in the chest.    Impression: 71 year old woman with newly discovered left lower lobe mass. This is a complex lobulated mass. There are multiple other lung nodules scattered throughout both lungs. Most of these in 3-5 mm in size. However there is one in the right lower lobe that is approximately 1 cm in diameter. I had a long discussion with Mr. and Mrs. [place] regarding these findings and reviewed the films with them. She is a lifelong nonsmoker and otherwise a very low risk for lung cancer. Reviewed the differential diagnosis which includes infectious, inflammatory, and neoplastic causes. I would not be surprised and released this turned out to be infectious or inflammatory in origin, along the lines of MAI or fungal infection.  She was scheduled to have a PET CT done tomorrow.  In my opinion the best way to proceed with be with electromagnetic navigational bronchoscopy to access the left lower lobe mass and hopefully the right lower lobe one as well. This would be to obtain biopsies and cultures. I recommended that we proceed with that before we do a PET/CT as the PET/CT would not necessarily discriminate between infectious or inflammatory or neoplastic causes. I discussed in detail with her husband the nature of the procedure, need for general anesthesia, approach flexible fiberoptic bronchoscopy via endotracheal tube. They understand there is no guarantee that definitive diagnosis can be made in this way. They understand the risks of pneumothorax and bleeding as well as the risks associated with general anesthesia. She had been cleared by Dr. Tamala [name] for total knee replacement and her Plavix was discontinued when she  saw him 10 days ago.    Plan: Proceed with electromagnetic navigational bronchoscopy on Monday, 04/13/2012. We will plan to do this as an outpatient procedure.

## 2012-04-13 NOTE — Interval H&P Note (Signed)
History and Physical Interval Note:  04/13/2012 9:40 AM  [name]  has presented today for surgery, with the diagnosis of (B) LUNG MASS  The various methods of treatment have been discussed with the patient and family. After consideration of risks, benefits and other options for treatment, the patient has consented to  Procedure(s) (LRB) with comments: [place] (N/A) as a surgical intervention .  The patient's history has been reviewed, patient examined, no change in status, stable for surgery.  I have reviewed the patient's chart and labs.  Questions were answered to the patient's satisfaction.     [place] C  ENB planning completed

## 2012-04-13 NOTE — Brief Op Note (Signed)
04/13/2012  12:27 PM  PATIENT:  Isabella [name]  72 y.o. female  PRE-OPERATIVE DIAGNOSIS:  (B) LUNG MASS  POST-OPERATIVE DIAGNOSIS:  Bilateral Lung Mass  PROCEDURE:  Procedure(s) (LRB) with comments: VIDEO BRONCHOSCOPY WITH ENDOBRONCHIAL NAVIGATION (N/A) Biopsies, brushings, washings and BAL  SURGEON:  Surgeon(s) and Role:    * Melrose Nakayama, MD - Primary  ANESTHESIA:   general  EBL:  Total I/O In: 1000 [I.V.:1000] Out: -   BLOOD ADMINISTERED:none  SPECIMEN:  Source of Specimen:  LLL and RLL nodules  DISPOSITION OF SPECIMEN:  PATHOLOGY and MICRO for AFB and fungus  PLAN OF CARE: Discharge to home after PACU  PATIENT DISPOSITION:  PACU - hemodynamically stable.   Delay start of Pharmacological VTE agent (>24hrs) due to surgical blood loss or risk of bleeding: not applicable  FINDINGS: Quick prep of LLL slides showed inflammation

## 2012-04-14 ENCOUNTER — Encounter (HOSPITAL_COMMUNITY): Payer: Self-pay | Admitting: Thoracic Surgery (Cardiothoracic Vascular Surgery)

## 2012-04-14 NOTE — Op Note (Signed)
Isabella [name], Isabella [name]                 ACCOUNT NO.:  [id number]  MEDICAL RECORD NO.:  [id number]  LOCATION:  [facility]                         FACILITY:  [facility]  PHYSICIAN:  Revonda Standard. [name], M.D.DATE OF BIRTH:  1940-07-07  DATE OF PROCEDURE:  04/13/2012 DATE OF DISCHARGE:  04/13/2012                              OPERATIVE REPORT   PREOPERATIVE DIAGNOSIS:  Bilateral lung nodules.  POSTOPERATIVE DIAGNOSIS:  Bilateral lung nodules.  PROCEDURE:  Video bronchoscopy, electromagnetic navigational bronchoscopy with biopsies, brushings, washings and bronchoalveolar lavage.  SURGEON:  Revonda Standard. [name], M.D.  ANESTHESIA:  General.  FINDINGS:  Both left lower lobe and right lower lobe nodules were navigated to without difficulty.  Initial specimens from left lower lobe on quick-prep showed bronchial epithelium.  Additional quick-prep slide showed inflammatory changes, no malignancy seen.  CLINICAL NOTE:  Isabella [name] is a 71 year old woman who was found on a preoperative chest x-ray to have a left lung nodule.  CT of the chest showed a lobulated mass in the left lower lobe as well as multiple other tiny lung nodules bilaterally, the largest one being in the right lower lobe.  She was advised to undergo electromagnetic navigational bronchoscopy to obtain tissue samples from the bilateral lung nodules. The indications, risks, benefits and alternatives were discussed in detail with the patient.  She understood and accepted the risk and wished to proceed.  OPERATIVE NOTE:  Isabella [name] was brought to the preoperative holding area on April 13, 2012.  She then was taken to the operating room, anesthetized, and intubated.  Flexible fiberoptic bronchoscopy was carried out via the endotracheal tube. It revealed normal endobronchial anatomy and no endobronchial lesions to the level of the subsegmental bronchi.  The locatable guide for the navigational bronchoscopy then was placed and the bronchial  tree was mapped.  The first target, which was the left lower lobe nodule, was selected and navigated to without difficulty, this was in the lateral basilar segment of the lower lobe. Brushings were obtained with a needle brush followed by biopsies and then additional brushings with a conventional brush.  The sampling was all done with fluoroscopic visualization.  The brushings were sent for quick-prep.  The biopsies were sent for both permanent pathology as well as fungal and AFB cultures.  While awaiting the results of the quick- Prep, the 1 cm nodule in the right lower lobe was selected as a target, this was in the medial basilar segment of the right lower lobe.  Again, the LG was navigated to within 1.5 cm of the center of the target without difficulty.  Needle brushings, biopsies, conventional brushings were obtained.  Fresh brushes were used for the specimen.  The original needle brush and brush tips were sent for cultures after sampling the left lower lobe nodule.  After obtaining biopsies and brushings, a bronchoalveolar lavage was performed with specimen being sent for Pathology.  At this point, the pathologist called back with the finding that there were benign bronchial epithelial cells on the initial stains of the left lower lobe nodule.  Therefore, the decision was made to obtain additional samples from the left lower lobe.  Locatable guide was reinserted and once  again, navigated to the lower lobe nodule within 1.5 cm of the center of the target and brushings, and biopsies were again obtained.  The brushings were sent for quick-prep, which returned with inflammatory changes, but no malignancy was seen. Additional specimens were obtained for AFB and fungal cultures once again, and finally a bronchoalveolar lavage was performed of this lesion as well with the specimen being sent for both cytology and cultures.  The bronchoscope was reinserted.  A final inspection was made.   There was minimal bleeding from the segmental bronchi where the biopsies and brushings had been obtained, this cleared with saline.  The scope was withdrawn.  The patient was extubated in the operating room and taken to the postanesthetic care unit in good condition.     Revonda Standard [name], M.D.     SCH/MEDQ  D:  04/13/2012  T:  04/14/2012  Job:  [id number]

## 2012-04-15 ENCOUNTER — Encounter (HOSPITAL_COMMUNITY): Payer: Medicare Other

## 2012-04-16 LAB — CULTURE, RESPIRATORY W GRAM STAIN
Culture: NO GROWTH
Gram Stain: NONE SEEN

## 2012-04-16 LAB — TISSUE CULTURE
Culture: NO GROWTH
Culture: NO GROWTH
Gram Stain: NONE SEEN

## 2012-04-17 ENCOUNTER — Other Ambulatory Visit: Payer: Self-pay | Admitting: *Deleted

## 2012-04-17 ENCOUNTER — Encounter (HOSPITAL_COMMUNITY): Payer: Medicare Other

## 2012-04-17 DIAGNOSIS — R918 Other nonspecific abnormal finding of lung field: Secondary | ICD-10-CM

## 2012-04-20 ENCOUNTER — Encounter (HOSPITAL_COMMUNITY): Payer: Medicare Other

## 2012-04-21 ENCOUNTER — Ambulatory Visit (INDEPENDENT_AMBULATORY_CARE_PROVIDER_SITE_OTHER): Payer: Medicare Other | Admitting: Thoracic Surgery (Cardiothoracic Vascular Surgery)

## 2012-04-21 ENCOUNTER — Encounter: Payer: Self-pay | Admitting: Thoracic Surgery (Cardiothoracic Vascular Surgery)

## 2012-04-21 VITALS — BP 140/73 | HR 65 | Resp 20 | Ht 62.0 in | Wt 209.0 lb

## 2012-04-21 DIAGNOSIS — Z9889 Other specified postprocedural states: Secondary | ICD-10-CM

## 2012-04-21 DIAGNOSIS — Z09 Encounter for follow-up examination after completed treatment for conditions other than malignant neoplasm: Secondary | ICD-10-CM

## 2012-04-21 DIAGNOSIS — R918 Other nonspecific abnormal finding of lung field: Secondary | ICD-10-CM

## 2012-04-21 NOTE — Progress Notes (Signed)
  HPI:  Isabella [name] returns to discuss results of her ENB. She is a 71 year old nonsmoker who has multiple bilateral lung nodules including a dominant mass in the left lower lobe. This has an inflammatory appearance. We did ENB on her last week. She tolerated the procedure well and has no respiratory complaints.  Past Medical History  Diagnosis Date  . Hypertension   . High cholesterol   . Coronary artery disease   . Myocardial infarct 03/24/2011    Dr. [name]  . Sleep apnea     uses cpap  . Complication of anesthesia     Took a while to wake knee  . History of blood transfusion       Current Outpatient Prescriptions  Medication Sig Dispense Refill  . aspirin 81 MG tablet Take 81 mg by mouth daily.       Marland Kitchen atorvastatin (LIPITOR) 40 MG tablet Take 40 mg by mouth at bedtime.      . chlorpheniramine (CHLOR-TRIMETON) 4 MG tablet Take 8 mg by mouth at bedtime.      . furosemide (LASIX) 40 MG tablet Take 40 mg by mouth daily.       . metoprolol succinate (TOPROL-XL) 25 MG 24 hr tablet Take 25 mg by mouth daily.      . nitroGLYCERIN (NITROSTAT) 0.4 MG SL tablet Place 0.4 mg under the tongue as needed. For chest pain      . olmesartan (BENICAR) 40 MG tablet Take 40 mg by mouth daily.         Physical Exam BP 140/73  Pulse 65  Resp 20  Ht 5' 2"  (1.575 m)  Wt 209 lb (94.802 kg)  BMI 38.23 kg/m2  SpO66 89% 71 year old woman in no acute distress Lungs equal breath sounds bilaterally  Diagnostic Tests: Pathology showed benign lung parenchyma was reparative change and fibrinous material. In one specimen a few atypical cells were noted  Impression: 71 year old woman with multiple lung nodules. Multiple specimens obtained bronchoscopically showed benign reactive and reparative changes, however one specimen did shows a few atypical cells. These were not diagnostic of malignancy. I still think this probably represents inflammatory change and may represent an atypical mycobacterial infection.  However given the finding of atypical cells I think it would be appropriate to do a CT guided needle biopsy. If that is benign then we would just follow these lesions and she could go ahead with knee surgery. We are also still awaiting the results of the fungal and AFB cultures which will take several weeks to  Plan: CT-guided needle biopsy of left lower lobe nodule If benign will plan to see her back in 2 months with a repeat CT of the chest

## 2012-04-22 ENCOUNTER — Encounter (HOSPITAL_COMMUNITY): Payer: Medicare Other

## 2012-04-22 ENCOUNTER — Other Ambulatory Visit: Payer: Self-pay | Admitting: Thoracic Surgery (Cardiothoracic Vascular Surgery)

## 2012-04-22 DIAGNOSIS — D381 Neoplasm of uncertain behavior of trachea, bronchus and lung: Secondary | ICD-10-CM

## 2012-04-24 ENCOUNTER — Encounter (HOSPITAL_COMMUNITY): Payer: Medicare Other

## 2012-04-27 ENCOUNTER — Encounter (HOSPITAL_COMMUNITY): Payer: Medicare Other

## 2012-04-29 ENCOUNTER — Encounter (HOSPITAL_COMMUNITY): Payer: Medicare Other

## 2012-05-01 ENCOUNTER — Encounter (HOSPITAL_COMMUNITY): Payer: Medicare Other

## 2012-05-01 ENCOUNTER — Other Ambulatory Visit: Payer: Self-pay | Admitting: Thoracic Surgery (Cardiothoracic Vascular Surgery)

## 2012-05-01 DIAGNOSIS — D381 Neoplasm of uncertain behavior of trachea, bronchus and lung: Secondary | ICD-10-CM

## 2012-05-04 ENCOUNTER — Encounter (HOSPITAL_COMMUNITY): Payer: Medicare Other

## 2012-05-08 ENCOUNTER — Encounter (HOSPITAL_COMMUNITY): Payer: Medicare Other

## 2012-05-08 ENCOUNTER — Encounter (HOSPITAL_COMMUNITY): Payer: Self-pay

## 2012-05-08 ENCOUNTER — Encounter (HOSPITAL_COMMUNITY)
Admission: RE | Admit: 2012-05-08 | Discharge: 2012-05-08 | Disposition: A | Discharge status: Home / Self Care | Payer: Medicare Other | Source: Ambulatory Visit | Attending: Thoracic Surgery (Cardiothoracic Vascular Surgery) | Admitting: Thoracic Surgery (Cardiothoracic Vascular Surgery)

## 2012-05-08 DIAGNOSIS — D381 Neoplasm of uncertain behavior of trachea, bronchus and lung: Secondary | ICD-10-CM

## 2012-05-08 DIAGNOSIS — J984 Other disorders of lung: Secondary | ICD-10-CM | POA: Insufficient documentation

## 2012-05-08 DIAGNOSIS — Z9089 Acquired absence of other organs: Secondary | ICD-10-CM | POA: Insufficient documentation

## 2012-05-08 DIAGNOSIS — R918 Other nonspecific abnormal finding of lung field: Secondary | ICD-10-CM | POA: Insufficient documentation

## 2012-05-08 DIAGNOSIS — N289 Disorder of kidney and ureter, unspecified: Secondary | ICD-10-CM | POA: Insufficient documentation

## 2012-05-08 HISTORY — DX: Solitary pulmonary nodule: R91.1

## 2012-05-08 LAB — GLUCOSE, CAPILLARY: Glucose-Capillary: 97 mg/dL (ref 70–99)

## 2012-05-08 MED ORDER — FLUDEOXYGLUCOSE F - 18 (FDG) INJECTION
17.8000 | Freq: Once | INTRAVENOUS | Status: AC | PRN
  Administered 2012-05-08: 17.8 via INTRAVENOUS

## 2012-05-11 ENCOUNTER — Other Ambulatory Visit: Payer: Self-pay | Admitting: Radiology

## 2012-05-11 ENCOUNTER — Encounter (HOSPITAL_COMMUNITY): Payer: Medicare Other

## 2012-05-11 LAB — FUNGUS CULTURE W SMEAR
Fungal Smear: NONE SEEN
Fungal Smear: NONE SEEN
Fungal Smear: NONE SEEN
Fungal Smear: NONE SEEN
Fungal Smear: NONE SEEN

## 2012-05-14 ENCOUNTER — Ambulatory Visit (HOSPITAL_COMMUNITY)
Admission: RE | Admit: 2012-05-14 | Discharge: 2012-05-14 | Disposition: A | Discharge status: Home / Self Care | Payer: Medicare Other | Source: Ambulatory Visit | Attending: Thoracic Surgery (Cardiothoracic Vascular Surgery) | Admitting: Thoracic Surgery (Cardiothoracic Vascular Surgery)

## 2012-05-14 ENCOUNTER — Ambulatory Visit (HOSPITAL_COMMUNITY)
Admission: RE | Admit: 2012-05-14 | Discharge: 2012-05-14 | Disposition: A | Discharge status: Home / Self Care | Payer: Medicare Other | Source: Ambulatory Visit | Attending: Interventional Radiology | Admitting: Interventional Radiology

## 2012-05-14 DIAGNOSIS — J984 Other disorders of lung: Secondary | ICD-10-CM | POA: Insufficient documentation

## 2012-05-14 DIAGNOSIS — I517 Cardiomegaly: Secondary | ICD-10-CM | POA: Insufficient documentation

## 2012-05-14 DIAGNOSIS — D381 Neoplasm of uncertain behavior of trachea, bronchus and lung: Secondary | ICD-10-CM

## 2012-05-14 LAB — CBC
MCH: 29.3 pg (ref 26.0–34.0)
MCHC: 32.5 g/dL (ref 30.0–36.0)
MCV: 90.2 fL (ref 78.0–100.0)
Platelets: 221 10*3/uL (ref 150–400)
RBC: 4.78 MIL/uL (ref 3.87–5.11)
RDW: 13.5 % (ref 11.5–15.5)

## 2012-05-14 LAB — PROTIME-INR: INR: 1 (ref 0.00–1.49)

## 2012-05-14 MED ORDER — MIDAZOLAM HCL 2 MG/2ML IJ SOLN
INTRAMUSCULAR | Status: DC | PRN
  Administered 2012-05-14 (×2): 1 mg via INTRAVENOUS

## 2012-05-14 MED ORDER — FENTANYL CITRATE 0.05 MG/ML IJ SOLN
INTRAMUSCULAR | Status: AC
  Filled 2012-05-14: qty 4

## 2012-05-14 MED ORDER — SODIUM CHLORIDE 0.9 % IV SOLN
INTRAVENOUS | Status: DC
  Administered 2012-05-14: 10:00:00 via INTRAVENOUS

## 2012-05-14 MED ORDER — FENTANYL CITRATE 0.05 MG/ML IJ SOLN
INTRAMUSCULAR | Status: DC | PRN
  Administered 2012-05-14: 50 ug via INTRAVENOUS

## 2012-05-14 MED ORDER — MIDAZOLAM HCL 2 MG/2ML IJ SOLN
INTRAMUSCULAR | Status: AC
  Filled 2012-05-14: qty 4

## 2012-05-14 NOTE — Procedures (Signed)
LLL lung mass Bx 18 g core times 4 No comp

## 2012-05-14 NOTE — H&P (Signed)
Isabella [name] is an 72 y.o. female.   Chief Complaint: left lung mass HPI: Patient with history of multiple pulmonary nodules and hypermetabolic LLL lung mass on recent PET . Cytology from recent bronchoscopic sampling of LLL mass revealed a few atypical cells. She presents today for CT guided biopsy of the LLL lung mass.  Past Medical History  Diagnosis Date  . Hypertension   . High cholesterol   . Coronary artery disease   . Myocardial infarct 03/24/2011    Dr. [name]  . Sleep apnea     uses cpap  . Complication of anesthesia     Took a while to wake knee  . History of blood transfusion   . Lung nodule     Past Surgical History  Procedure Date  . Joint replacement   . Femoral artery exploration 03/25/2011    Procedure: FEMORAL ARTERY EXPLORATION;  Surgeon: Elam Dutch, MD;  Location: [facility] OR;  Service: Vascular;  Laterality: Right;  Evacuation of Hematoma   . Tonsillectomy and adenoidectomy   . Cholecystectomy   . Abdominal hysterectomy   . Carpal tunnel release   . Cataract extraction   . Shoulder surgery   . Coronary stent placement 03/24/2011    Dr. [name]  . Cardiac catheterization 03/2011  . Total knee arthroplasty 07/2010    Left  . Eye surgery [phone]    corneal transplant  . Laser surgery left eye 2012  . Tonsillectomy   . Umbilical hernia repair     x 2  . Appendectomy   . Knee arthroscopy     Right  . Tubal ligation   . Video bronchoscopy with endobronchial navigation 04/13/2012    Procedure: VIDEO BRONCHOSCOPY WITH ENDOBRONCHIAL NAVIGATION;  Surgeon: Melrose Nakayama, MD;  Location: [facility] OR;  Service: Thoracic;  Laterality: N/A;    Family History  Problem Relation Age of Onset  . Hypertension Mother   . Cancer Father    Social History:  reports that she has never smoked. She has never used smokeless tobacco. She reports that she does not drink alcohol or use illicit drugs.  Allergies:  Allergies  Allergen Reactions  . Codeine     Back hurts    . Neomycin Swelling  . Penicillins Hives  . Percocet (Oxycodone-Acetaminophen)     confusion  . Zestril (Lisinopril) Cough    Current outpatient prescriptions:aspirin 81 MG tablet, Take 81 mg by mouth daily. , Disp: , Rfl: ;  atorvastatin (LIPITOR) 40 MG tablet, Take 40 mg by mouth at bedtime., Disp: , Rfl: ;  chlorpheniramine (CHLOR-TRIMETON) 4 MG tablet, Take 8 mg by mouth at bedtime., Disp: , Rfl: ;  furosemide (LASIX) 40 MG tablet, Take 40 mg by mouth daily. , Disp: , Rfl: ;  ibuprofen (ADVIL,MOTRIN) 200 MG tablet, Take 200 mg by mouth once., Disp: , Rfl:  metoprolol tartrate (LOPRESSOR) 25 MG tablet, Take 25 mg by mouth 2 (two) times daily., Disp: , Rfl: ;  nitroGLYCERIN (NITROSTAT) 0.4 MG SL tablet, Place 0.4 mg under the tongue as needed. For chest pain, Disp: , Rfl: ;  olmesartan (BENICAR) 40 MG tablet, Take 40 mg by mouth daily. , Disp: , Rfl:  Current facility-administered medications:0.9 %  sodium chloride infusion, , Intravenous, Continuous, D Rowe Robert, PA, Last Rate: 20 mL/hr at 05/14/12 1003   Results for orders placed during the hospital encounter of 05/14/12 (from the past 48 hour(s))  APTT     Status: Normal   Collection Time  05/14/12  9:46 AM      Component Value Range Comment   aPTT 29  24 - 37 seconds   CBC     Status: Normal   Collection Time   05/14/12  9:46 AM      Component Value Range Comment   WBC 4.2  4.0 - 10.5 K/uL    RBC 4.78  3.87 - 5.11 MIL/uL    Hemoglobin 14.0  12.0 - 15.0 g/dL    HCT 43.1  36.0 - 46.0 %    MCV 90.2  78.0 - 100.0 fL    MCH 29.3  26.0 - 34.0 pg    MCHC 32.5  30.0 - 36.0 g/dL    RDW 13.5  11.5 - 15.5 %    Platelets 221  150 - 400 K/uL   PROTIME-INR     Status: Normal   Collection Time   05/14/12  9:46 AM      Component Value Range Comment   Prothrombin Time 13.1  11.6 - 15.2 seconds    INR 1.00  0.00 - 1.49    No results found.  Review of Systems  Constitutional: Negative for fever and chills.  HENT:       Occ HA's   Respiratory: Positive for cough. Negative for hemoptysis and shortness of breath.   Cardiovascular: Negative for chest pain.  Gastrointestinal: Negative for nausea, vomiting and abdominal pain.  Musculoskeletal: Positive for joint pain. Negative for back pain.  Endo/Heme/Allergies: Does not bruise/bleed easily.    Blood pressure 151/77, pulse 70, temperature 98.2 F (36.8 C), temperature source Oral, resp. rate 18, height 5' 2"  (1.575 m), weight 209 lb (94.802 kg), SpO2 97.00%. Physical Exam  Constitutional: She is oriented to person, place, and time. She appears well-developed and well-nourished.  Cardiovascular: Normal rate and regular rhythm.   Respiratory: Effort normal and breath sounds normal.  GI: Soft. Bowel sounds are normal. There is no tenderness.  Musculoskeletal: Normal range of motion.  Neurological: She is alert and oriented to person, place, and time.     Assessment/Plan Pt with hypermetabolic LLL lung mass and recent bronchoscopy revealing few atypical cells. Plan is for CT guided biopsy of the LLL lung mass today. Details/risks of procedure d/w pt/pt's daughter with their understanding and consent.  ALLRED,D KEVIN 05/14/2012, 10:39 AM

## 2012-05-15 ENCOUNTER — Other Ambulatory Visit: Payer: Self-pay | Admitting: Orthopedic Surgery

## 2012-05-15 ENCOUNTER — Encounter (HOSPITAL_COMMUNITY): Payer: Medicare Other

## 2012-05-15 MED ORDER — BUPIVACAINE LIPOSOME 1.3 % IJ SUSP
20.0000 mL | Freq: Once | INTRAMUSCULAR | Status: DC
Start: 2012-05-15 — End: 2012-05-15

## 2012-05-15 MED ORDER — DEXAMETHASONE SODIUM PHOSPHATE 10 MG/ML IJ SOLN: 10.0000 mg | Freq: Once | INTRAMUSCULAR | Status: DC

## 2012-05-15 NOTE — Progress Notes (Signed)
Preoperative surgical orders have been place into the Epic hospital system for Isabella [name] on 05/15/2012, 7:50 AM  by Mickel Crow for surgery on 06/17/2012.  Preop Total Knee orders including Experal, IV Tylenol, and IV Decadron as long as there are no contraindications to the above medications. Arlee Muslim, PA-C

## 2012-05-18 ENCOUNTER — Encounter (HOSPITAL_COMMUNITY): Payer: Medicare Other

## 2012-05-19 ENCOUNTER — Ambulatory Visit (INDEPENDENT_AMBULATORY_CARE_PROVIDER_SITE_OTHER): Payer: Medicare Other | Admitting: Thoracic Surgery (Cardiothoracic Vascular Surgery)

## 2012-05-19 ENCOUNTER — Encounter: Payer: Self-pay | Admitting: Thoracic Surgery (Cardiothoracic Vascular Surgery)

## 2012-05-19 VITALS — BP 146/74 | HR 57 | Resp 18 | Ht 62.0 in | Wt 209.0 lb

## 2012-05-19 DIAGNOSIS — R918 Other nonspecific abnormal finding of lung field: Secondary | ICD-10-CM

## 2012-05-19 DIAGNOSIS — Z09 Encounter for follow-up examination after completed treatment for conditions other than malignant neoplasm: Secondary | ICD-10-CM

## 2012-05-19 DIAGNOSIS — Z9889 Other specified postprocedural states: Secondary | ICD-10-CM

## 2012-05-19 NOTE — Progress Notes (Signed)
HPI:  Isabella [name] returns today to discuss results for CT-guided biopsy of her left lower lobe mass.  She is a 72 year old [name] who had an abnormal chest x-ray on a preoperative evaluation for knee replacement. This led to a CT scan which showed a dominant left lower lobe mass and multiple nodules throughout both lungs.   We did ENB which showed reactive and repairative changes. There was no malignancy, but on one of 12 samples there were some atypical cells.   A CT guided needle biopsy was ordered. Radiology requested a PET/CT prior to biopsy. It showed the left lower lobe mass to be hypermetabolic. The other nodules were indeterminate.  Needle biopsy was done on January 2. 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 Stains were negative for yeast, fungi and mycobacteria.  Past Medical History  Diagnosis Date  . Hypertension   . High cholesterol   . Coronary artery disease   . Myocardial infarct 03/24/2011    Dr. [name]  . Sleep apnea     uses cpap  . Complication of anesthesia     Took a while to wake knee  . History of blood transfusion   . Lung nodule       Current Outpatient Prescriptions  Medication Sig Dispense Refill  . aspirin 81 MG tablet Take 81 mg by mouth daily.       Marland Kitchen atorvastatin (LIPITOR) 40 MG tablet Take 40 mg by mouth at bedtime.      . chlorpheniramine (CHLOR-TRIMETON) 4 MG tablet Take 8 mg by mouth at bedtime.      . furosemide (LASIX) 40 MG tablet Take 40 mg by mouth daily.       Marland Kitchen ibuprofen (ADVIL,MOTRIN) 200 MG tablet Take 200 mg by mouth once.      . metoprolol tartrate (LOPRESSOR) 25 MG tablet Take 25 mg by mouth 2 (two) times daily.      . nitroGLYCERIN (NITROSTAT) 0.4 MG SL tablet Place 0.4 mg under the tongue as needed. For chest pain      . olmesartan (BENICAR) 40 MG tablet Take 40 mg by mouth daily.         Physical Exam BP 146/74  Pulse 57  Resp 18  Ht 5' 2"  (1.575 m)  Wt 209 lb (94.802 kg)  BMI 38.23 kg/m2  SpO20  22% Isabella [name] in no acute distress Lungs clear equal breath sounds bilaterally  Diagnostic Tests: See history of present illness  Impression: Isabella [name] is a Isabella [name] with a left lower lobe "mass" and multiple scattered subcentimeter lung nodules. My initial impression of these lesions was that they appeared inflammatory or infectious. We were unable to demonstrate granulomatous disease with bronchoscopy, but it is present on needle biopsy. There is no evidence of malignancy.  The question arises as to the etiology of the nodules. This is either infectious or inflammatory (sarcoid). She is a little for the first manifestation of sarcoidosis, although I guess it's not totally of the question. I would think an atypical microbacterial or fungal etiology would be more likely. However we have not been able to demonstrate either one of those on stains and cultures to date.  I will refer her to Dr. Michel Bickers of infectious disease to get his opinion on the matter and see if any serologic testing for fungal antigens or blood testing for mycobacteria would be necessary.  I think she is fine to proceed with her knee replacement and schedule him February 5.  I will  plan to see her back in 3 months with a repeat CT to follow the progress of the lesions.   Plan: Referral Dr. Michel Bickers  Return to office in 3 months with CT of chest.

## 2012-05-20 ENCOUNTER — Encounter (HOSPITAL_COMMUNITY): Payer: Medicare Other

## 2012-05-22 ENCOUNTER — Encounter (HOSPITAL_COMMUNITY): Payer: Medicare Other

## 2012-05-25 ENCOUNTER — Encounter (HOSPITAL_COMMUNITY): Payer: Medicare Other

## 2012-05-26 LAB — AFB CULTURE WITH SMEAR (NOT AT ARMC): Acid Fast Smear: NONE SEEN

## 2012-05-27 ENCOUNTER — Encounter: Payer: Self-pay | Admitting: Internal Medicine

## 2012-05-27 ENCOUNTER — Ambulatory Visit (INDEPENDENT_AMBULATORY_CARE_PROVIDER_SITE_OTHER): Payer: Medicare Other | Admitting: Internal Medicine

## 2012-05-27 ENCOUNTER — Encounter (HOSPITAL_COMMUNITY): Payer: Medicare Other

## 2012-05-27 VITALS — BP 139/85 | HR 65 | Temp 97.9°F | Ht 62.0 in | Wt 211.5 lb

## 2012-05-27 DIAGNOSIS — M1990 Unspecified osteoarthritis, unspecified site: Secondary | ICD-10-CM

## 2012-05-27 DIAGNOSIS — J8410 Pulmonary fibrosis, unspecified: Secondary | ICD-10-CM

## 2012-05-27 DIAGNOSIS — R911 Solitary pulmonary nodule: Secondary | ICD-10-CM

## 2012-05-27 NOTE — Progress Notes (Signed)
[name]  Reason for Consult: Lung Granuloma Referring Physician: Dr. Merilynn Finland  Patient Active Problem List  Diagnosis  . DYSLIPIDEMIA  . OBSTRUCTIVE SLEEP APNEA  . HYPERTENSION  . Acute MI, inferior wall  . Obesity (BMI 30-39.9)  . Hematoma following PCI  . Anemia associated with acute blood loss  . Edema of lower extremity  . Lung granuloma  . DJD (degenerative joint disease)  . Lung nodule    Patient's Medications  New Prescriptions   No medications on file  Previous Medications   ASPIRIN 81 MG TABLET    Take 81 mg by mouth daily.    ATORVASTATIN (LIPITOR) 40 MG TABLET    Take 40 mg by mouth at bedtime.   CHLORPHENIRAMINE (CHLOR-TRIMETON) 4 MG TABLET    Take 8 mg by mouth at bedtime.   FUROSEMIDE (LASIX) 40 MG TABLET    Take 40 mg by mouth daily.    IBUPROFEN (ADVIL,MOTRIN) 200 MG TABLET    Take 200 mg by mouth once.   METOPROLOL TARTRATE (LOPRESSOR) 25 MG TABLET    Take 25 mg by mouth 2 (two) times daily.   NITROGLYCERIN (NITROSTAT) 0.4 MG SL TABLET    Place 0.4 mg under the tongue as needed. For chest pain   OLMESARTAN (BENICAR) 40 MG TABLET    Take 40 mg by mouth daily.   Modified Medications   No medications on file  Discontinued Medications   No medications on file    Recommendations: 1. Continue observation off of antimicrobial agents 2. Recommend serial CT imaging of the right lung nodules   Assessment: I have no way of currently making a firm diagnosis of her bilateral lung nodules. I obtained blood today for quantiferon Gold assay for latent tuberculosis and this was negative suggesting that her granuloma is not due to TB. I also obtained a serum ACE level which was negative. She has no clinical findings to suggest active infection at this time and is not producing sputum. Looking at her past chest x-rays it does appear that this lesion has come up recently. I would recommend serial CT scans. If the lesions are  enlarging I would consider her resection of the left lower lobe nodule for complete pathological and microbiological evaluation.  I do not think that there is a need to postpone her knee surgery. It is reasonable to proceed ahead with that procedure and schedule a chest CT scan and she has had sufficient time postoperatively to recover.   HPI: Isabella [name] is a 72 y.o. female who has degenerative joint disease and was preparing to undergo right total knee replacement by Dr. Hector Shade. Her preoperative chest x-ray showed a left lung nodule. She was referred to Dr. Merilynn Finland and underwent a CT scan which showed a lobulated mass peripherally in the left lower lobe and smaller bilateral nodules. She underwent bronchoscopy with brushings on December 2. Cytology was benign. Gram, AFB and fungal stains were negative and all cultures are negative at 6 weeks.  She underwent a PET scan which showed the left lower lobe lesion to be hypermetabolic. She had a needle biopsy of that lesion on January 2. Pathology on that specimen showed "benign, nonnecrotizing epithelioid granulomas". All special stains on the pathology specimen were negative. No cultures were obtained.  She was born and raised in [place] and has lived here throughout her life. She has obstructive sleep apnea and uses a CPAP mask at night. She  obtained a new mask and machine sometime last year and is concerned that it may have caused these lesions. She had a very brief episode of coughing that she described as bronchitis last fall. It resolved spontaneously and she feels well now. She has no history of pneumonia or other significant pulmonary problems. She is a never smoker. She has no history of TB exposure that she is aware of. She has no unusual animal exposure or travel history. Other than her joint pain she has been feeling well recently. She has not had any fever, chills, sweats, cough, no shortness of breath, anorexia or weight  loss.  Review of Systems: Pertinent items are noted in HPI.      Past Medical History  Diagnosis Date  . Hypertension   . High cholesterol   . Coronary artery disease   . Myocardial infarct 03/24/2011    Dr. [name]  . Sleep apnea     uses cpap  . Complication of anesthesia     Took a while to wake knee  . History of blood transfusion   . Lung nodule     History  Substance Use Topics  . Smoking status: Never Smoker   . Smokeless tobacco: Never Used  . Alcohol Use: No    Family History  Problem Relation Age of Onset  . Hypertension Mother   . Cancer Father    Allergies  Allergen Reactions  . Codeine     Back hurts  . Neomycin Swelling  . Penicillins Hives  . Percocet (Oxycodone-Acetaminophen)     confusion  . Zestril (Lisinopril) Cough    OBJECTIVE: Blood pressure 139/85, pulse 65, temperature 97.9 F (36.6 C), temperature source Oral, height 5' 2"  (1.575 m), weight 211 lb 8 oz (95.936 kg).  General: She is a pleasant woman appears in no distress Skin: No rash Lymph nodes: No palpable adenopathy Oral: No oropharyngeal lesions Lungs: Clear Cor: Regular S1-S2 no murmurs Abdomen: Obese, soft and nontender  Microbiology: No results found for this or any previous visit (from the past 240 hour(s)).  Michel Bickers, MD [facility] [phone] pager   [phone] cell 06/03/2012, 10:58 AM

## 2012-05-29 ENCOUNTER — Encounter (HOSPITAL_COMMUNITY): Payer: Medicare Other

## 2012-06-01 ENCOUNTER — Encounter (HOSPITAL_COMMUNITY): Payer: Medicare Other

## 2012-06-01 DIAGNOSIS — M1990 Unspecified osteoarthritis, unspecified site: Secondary | ICD-10-CM | POA: Insufficient documentation

## 2012-06-03 ENCOUNTER — Encounter (HOSPITAL_COMMUNITY): Payer: Medicare Other

## 2012-06-03 DIAGNOSIS — R911 Solitary pulmonary nodule: Secondary | ICD-10-CM | POA: Insufficient documentation

## 2012-06-04 ENCOUNTER — Encounter (HOSPITAL_COMMUNITY): Payer: Self-pay | Admitting: Pharmacy Technician

## 2012-06-05 ENCOUNTER — Encounter (HOSPITAL_COMMUNITY): Payer: Self-pay

## 2012-06-05 ENCOUNTER — Ambulatory Visit (HOSPITAL_COMMUNITY)
Admission: RE | Admit: 2012-06-05 | Discharge: 2012-06-05 | Disposition: A | Discharge status: Home / Self Care | Payer: Medicare Other | Source: Ambulatory Visit | Attending: Orthopedic Surgery | Admitting: Orthopedic Surgery

## 2012-06-05 ENCOUNTER — Encounter (HOSPITAL_COMMUNITY)
Admission: RE | Admit: 2012-06-05 | Discharge: 2012-06-05 | Disposition: A | Discharge status: Home / Self Care | Payer: Medicare Other | Source: Ambulatory Visit | Attending: Orthopedic Surgery | Admitting: Orthopedic Surgery

## 2012-06-05 ENCOUNTER — Encounter (HOSPITAL_COMMUNITY): Payer: Medicare Other

## 2012-06-05 DIAGNOSIS — M47814 Spondylosis without myelopathy or radiculopathy, thoracic region: Secondary | ICD-10-CM | POA: Insufficient documentation

## 2012-06-05 DIAGNOSIS — M954 Acquired deformity of chest and rib: Secondary | ICD-10-CM | POA: Insufficient documentation

## 2012-06-05 DIAGNOSIS — M1710 Unilateral primary osteoarthritis, unspecified knee: Secondary | ICD-10-CM | POA: Insufficient documentation

## 2012-06-05 DIAGNOSIS — R222 Localized swelling, mass and lump, trunk: Secondary | ICD-10-CM | POA: Insufficient documentation

## 2012-06-05 DIAGNOSIS — I517 Cardiomegaly: Secondary | ICD-10-CM | POA: Insufficient documentation

## 2012-06-05 DIAGNOSIS — Z01812 Encounter for preprocedural laboratory examination: Secondary | ICD-10-CM | POA: Insufficient documentation

## 2012-06-05 HISTORY — DX: Unspecified osteoarthritis, unspecified site: M19.90

## 2012-06-05 LAB — COMPREHENSIVE METABOLIC PANEL
AST: 25 U/L (ref 0–37)
Albumin: 3.7 g/dL (ref 3.5–5.2)
Calcium: 9 mg/dL (ref 8.4–10.5)
Creatinine, Ser: 0.73 mg/dL (ref 0.50–1.10)
GFR calc non Af Amer: 85 mL/min — ABNORMAL LOW (ref >90)
Total Protein: 7 g/dL (ref 6.0–8.3)

## 2012-06-05 LAB — URINALYSIS, ROUTINE W REFLEX MICROSCOPIC
Ketones, ur: NEGATIVE mg/dL
Leukocytes, UA: NEGATIVE
Nitrite: NEGATIVE
Specific Gravity, Urine: 1.009 (ref 1.005–1.030)
pH: 5.5 (ref 5.0–8.0)

## 2012-06-05 LAB — CBC
MCH: 29.4 pg (ref 26.0–34.0)
MCHC: 32.4 g/dL (ref 30.0–36.0)
MCV: 90.6 fL (ref 78.0–100.0)
Platelets: 215 10*3/uL (ref 150–400)
RDW: 13.8 % (ref 11.5–15.5)

## 2012-06-05 LAB — PROTIME-INR: INR: 1.07 (ref 0.00–1.49)

## 2012-06-05 LAB — SURGICAL PCR SCREEN
MRSA, PCR: NEGATIVE
Staphylococcus aureus: POSITIVE — AB

## 2012-06-05 NOTE — Progress Notes (Signed)
EKG from [facility] 03/25/2012 and [facility] last office visit 03/23/2012 all on chart.

## 2012-06-05 NOTE — Patient Instructions (Addendum)
20      Your procedure is scheduled on:  Wednesday 06/17/2012  Report to [facility] at 115 pm  Call this number if you have problems the morning of surgery: [phone]   Remember:MAY HAVE CLEAR LIQUIDS FROM MIDNIGHT UP UNTIL 0945 AM THEN NOTHING UNTIL AFTER SURGERY!             IF YOU USE CPAP,BRING MASK AND TUBING AM OF SURGERY!   Do not eat food or drink liquids AFTER MIDNIGHT!  Take these medicines the morning of surgery with A SIP OF WATER: Metoprolol   Do not bring valuables to the hospital.  .  Leave suitcase in the car. After surgery it may be brought to your room.  For patients admitted to the hospital, checkout time is 11:00 AM the day of              Discharge.    DO NOT WEAR JEWELRY , MAKE-UP, LOTIONS,POWDERS,PERFUMES!             WOMEN -DO NOT SHAVE LEGS OR UNDERARMS 12 HRS. BEFORE  SURGERY!               MEN MAY SHAVE AS USUAL!             CONTACTS,DENTURES OR BRIDGEWORK, FALSE EYELASHES MAY NOT BE WORN INTO SURGERY!                                           Patients discharged the day of surgery will not be allowed to drive home.If going home the same day of surgery, must have someone stay with youfirst 24 hrs.at home and arrange for someone to drive you home from the              [place] [id number]   Special Instructions:             Please read over the following fact sheets that you were given:             1. [place]              2.MRSA INFORMATION              [place].Brinsley Wence,RN,BSN     [phone]                FAILURE TO FOLLOW THESE INSTRUCTIONS MAY RESULT IN CANCELLATION OF YOUR SURGERY!               Patient Signature:___________________________

## 2012-06-08 ENCOUNTER — Other Ambulatory Visit: Payer: Self-pay | Admitting: Family Medicine

## 2012-06-08 ENCOUNTER — Encounter (HOSPITAL_COMMUNITY): Payer: Medicare Other

## 2012-06-08 DIAGNOSIS — Z1231 Encounter for screening mammogram for malignant neoplasm of breast: Secondary | ICD-10-CM

## 2012-06-10 ENCOUNTER — Encounter (HOSPITAL_COMMUNITY): Payer: Medicare Other

## 2012-06-10 ENCOUNTER — Ambulatory Visit: Payer: Medicare Other

## 2012-06-11 NOTE — H&P (Signed)
TOTAL KNEE ADMISSION H&P  Patient is being admitted for right total knee arthroplasty.  Subjective:  Chief Complaint:right knee pain.  HPI: Isabella [name], 72 y.o. female, has a history of pain and functional disability in the right knee due to arthritis and has failed non-surgical conservative treatments for greater than 12 weeks to includeNSAID's and/or analgesics, corticosteriod injections, flexibility and strengthening excercises and activity modification.  Onset of symptoms was gradual, starting 3 years ago with gradually worsening course since that time. The patient noted prior procedures on the knee to include  arthroscopy and menisectomy on the right knee(s).  Patient currently rates pain in the right knee(s) at 8 out of 10 with activity. Patient has night pain, worsening of pain with activity and weight bearing, pain that interferes with activities of daily living, pain with passive range of motion, crepitus and joint swelling.  Patient has evidence of subchondral sclerosis, periarticular osteophytes and joint space narrowing by imaging studies. There is no active infection.  Patient Active Problem List   Diagnosis Date Noted  . Lung nodule 06/03/2012  . DJD (degenerative joint disease) 06/01/2012  . Lung granuloma 05/27/2012  . Edema of lower extremity 03/29/2011  . Anemia associated with acute blood loss 03/25/2011  . Acute MI, inferior wall 03/24/2011  . Hematoma following PCI 03/24/2011  . Obesity (BMI 30-39.9)   . OBSTRUCTIVE SLEEP APNEA 01/25/2009  . DYSLIPIDEMIA 01/24/2009  . HYPERTENSION 01/24/2009   Past Medical History  Diagnosis Date  . Hypertension   . High cholesterol   . Coronary artery disease   . Myocardial infarct 03/24/2011    Dr. [name]  . Sleep apnea     uses cpap  . Complication of anesthesia     Took a while to wake knee  . History of blood transfusion   . Lung nodule   . Arthritis     Past Surgical History  Procedure Date  . Joint replacement     . Femoral artery exploration 03/25/2011    Procedure: FEMORAL ARTERY EXPLORATION;  Surgeon: Elam Dutch, MD;  Location: [facility] OR;  Service: Vascular;  Laterality: Right;  Evacuation of Hematoma   . Tonsillectomy and adenoidectomy   . Cholecystectomy   . Abdominal hysterectomy   . Carpal tunnel release   . Cataract extraction   . Shoulder surgery   . Coronary stent placement 03/24/2011    Dr. [name]  . Cardiac catheterization 03/2011  . Total knee arthroplasty 07/2010    Left  . Eye surgery [id number]    corneal transplant  . Laser surgery left eye 2012  . Tonsillectomy   . Umbilical hernia repair     x 2  . Appendectomy   . Knee arthroscopy     Right  . Tubal ligation   . Video bronchoscopy with endobronchial navigation 04/13/2012    Procedure: VIDEO BRONCHOSCOPY WITH ENDOBRONCHIAL NAVIGATION;  Surgeon: Melrose Nakayama, MD;  Location: [facility];  Service: Thoracic;  Laterality: N/A;  . Hernia repair 2979    umbilical  . Coronary angioplasty 2012  . Hematoma surgery     from cardiac cath -hematoma in groin-right    Current outpatient prescriptions aspirin 81 MG tablet, Take 81 mg by mouth daily. , Disp: , Rfl: ;   atorvastatin (LIPITOR) 40 MG tablet, Take 40 mg by mouth at bedtime., Disp: , Rfl: ;   chlorpheniramine (CHLOR-TRIMETON) 4 MG tablet, Take 4 mg by mouth at bedtime. , Disp: , Rfl: ;  furosemide (LASIX) 40  MG tablet, Take 40 mg by mouth daily before breakfast. , Disp: , Rfl:  ibuprofen (ADVIL,MOTRIN) 200 MG tablet, Take 200 mg by mouth every 6 (six) hours as needed. Pain, Disp: , Rfl: ;   metoprolol tartrate (LOPRESSOR) 25 MG tablet, Take 25 mg by mouth 2 (two) times daily., Disp: , Rfl: ;   nitroGLYCERIN (NITROSTAT) 0.4 MG SL tablet, Place 0.4 mg under the tongue as needed. For chest pain, Disp: , Rfl: ;   olmesartan (BENICAR) 40 MG tablet, Take 40 mg by mouth daily before breakfast. , Disp: , Rfl:    Allergies  Allergen Reactions  . Codeine     Back hurts  .  Neomycin Swelling  . Penicillins Hives  . Percocet (Oxycodone-Acetaminophen)     confusion  . Zestril (Lisinopril) Cough    History  Substance Use Topics  . Smoking status: Never Smoker   . Smokeless tobacco: Never Used  . Alcohol Use: No    Family History  Problem Relation Age of Onset  . Hypertension Mother   . Cancer Father      Review of Systems  Constitutional: Negative.   HENT: Negative.  Negative for neck pain.   Eyes: Negative.   Respiratory: Negative.   Cardiovascular: Negative.   Gastrointestinal: Negative.   Genitourinary: Positive for frequency. Negative for dysuria, urgency, hematuria and flank pain.  Musculoskeletal: Positive for joint pain. Negative for myalgias, back pain and falls.       Right knee pain  Skin: Negative.   Neurological: Negative.   Endo/Heme/Allergies: Negative.   Psychiatric/Behavioral: Negative.     Objective:  Physical Exam  Constitutional: She is oriented to person, place, and time. She appears well-developed and well-nourished. No distress.  HENT:  Head: Normocephalic and atraumatic.  Right Ear: External ear normal.  Left Ear: External ear normal.  Nose: Nose normal.  Mouth/Throat: Oropharynx is clear and moist.  Eyes: Conjunctivae normal and EOM are normal.  Neck: Normal range of motion. Neck supple. No tracheal deviation present. No thyromegaly present.  Cardiovascular: Normal rate, regular rhythm, normal heart sounds and intact distal pulses.   No murmur heard. Respiratory: Effort normal and breath sounds normal. No respiratory distress. She has no wheezes. She exhibits no tenderness.  GI: Soft. Bowel sounds are normal. She exhibits no distension and no mass. There is no tenderness.  Musculoskeletal:       Right hip: Normal.       Left hip: Normal.       Right knee: She exhibits decreased range of motion and swelling. She exhibits no erythema. tenderness found. Medial joint line tenderness noted.       Left knee: Normal.         Right lower leg: She exhibits no tenderness and no swelling.       Left lower leg: She exhibits no tenderness and no swelling.       Legs: Lymphadenopathy:    She has no cervical adenopathy.  Neurological: She is alert and oriented to person, place, and time. She has normal strength and normal reflexes. No sensory deficit.  Skin: No rash noted. She is not diaphoretic. No erythema.  Psychiatric: She has a normal mood and affect. Her behavior is normal.   Vitals Weight: 210 lb Height: 61 in Body Surface Area: 2.02 m Body Mass Index: 39.68 kg/m Pulse: 72 (Regular) Resp.: 18 (Unlabored) BP: 138/86 (Sitting, Left Arm, Standard)    Estimated Body mass index is 38.23 kg/(m^2) as calculated from the  following:   Height as of 04/21/12: 5' 2" (1.575 m).   Weight as of 04/21/12: 209 lb(94.802 kg).   Imaging Review Plain radiographs demonstrate severe degenerative joint disease of the right knee(s). The overall alignment ismild varus. The bone quality appears to be good for age and reported activity level.  Assessment/Plan:  End stage arthritis, right knee   The patient history, physical examination, clinical judgment of the provider and imaging studies are consistent with end stage degenerative joint disease of the right knee(s) and total knee arthroplasty is deemed medically necessary. The treatment options including medical management, injection therapy arthroscopy and arthroplasty were discussed at length. The risks and benefits of total knee arthroplasty were presented and reviewed. The risks due to aseptic loosening, infection, stiffness, patella tracking problems, thromboembolic complications and other imponderables were discussed. The patient acknowledged the explanation, agreed to proceed with the plan and consent was signed. Patient is being admitted for inpatient treatment for surgery, pain control, PT, OT, prophylactic antibiotics, VTE prophylaxis, progressive  ambulation and ADL's and discharge planning. The patient is planning to be discharged home with home health services     [place], [place]

## 2012-06-12 ENCOUNTER — Encounter (HOSPITAL_COMMUNITY): Payer: Medicare Other

## 2012-06-15 ENCOUNTER — Encounter (HOSPITAL_COMMUNITY): Payer: Medicare Other

## 2012-06-17 ENCOUNTER — Encounter (HOSPITAL_COMMUNITY): Payer: Self-pay | Admitting: *Deleted

## 2012-06-17 ENCOUNTER — Inpatient Hospital Stay (HOSPITAL_COMMUNITY): Payer: Medicare Other | Admitting: Anesthesiology

## 2012-06-17 ENCOUNTER — Encounter (HOSPITAL_COMMUNITY): Payer: Self-pay | Admitting: Anesthesiology

## 2012-06-17 ENCOUNTER — Encounter (HOSPITAL_COMMUNITY)
Admission: RE | Disposition: A | Discharge status: Home Health | Payer: Self-pay | Source: Ambulatory Visit | Attending: Orthopedic Surgery

## 2012-06-17 ENCOUNTER — Inpatient Hospital Stay (HOSPITAL_COMMUNITY)
Admission: RE | Admit: 2012-06-17 | Discharge: 2012-06-19 | DRG: 470 | Disposition: A | Discharge status: Home Health | Payer: Medicare Other | Source: Ambulatory Visit | Attending: Orthopedic Surgery | Admitting: Orthopedic Surgery

## 2012-06-17 ENCOUNTER — Encounter (HOSPITAL_COMMUNITY): Payer: Medicare Other

## 2012-06-17 DIAGNOSIS — G4730 Sleep apnea, unspecified: Secondary | ICD-10-CM | POA: Diagnosis present

## 2012-06-17 DIAGNOSIS — M1710 Unilateral primary osteoarthritis, unspecified knee: Principal | ICD-10-CM | POA: Diagnosis present

## 2012-06-17 DIAGNOSIS — E7800 Pure hypercholesterolemia, unspecified: Secondary | ICD-10-CM | POA: Diagnosis present

## 2012-06-17 DIAGNOSIS — Z96659 Presence of unspecified artificial knee joint: Secondary | ICD-10-CM

## 2012-06-17 DIAGNOSIS — M179 Osteoarthritis of knee, unspecified: Secondary | ICD-10-CM

## 2012-06-17 DIAGNOSIS — I10 Essential (primary) hypertension: Secondary | ICD-10-CM | POA: Diagnosis present

## 2012-06-17 DIAGNOSIS — I252 Old myocardial infarction: Secondary | ICD-10-CM

## 2012-06-17 HISTORY — PX: TOTAL KNEE ARTHROPLASTY: SHX125

## 2012-06-17 LAB — TYPE AND SCREEN: ABO/RH(D): A POS

## 2012-06-17 SURGERY — ARTHROPLASTY, KNEE, TOTAL
Anesthesia: Spinal | Site: Knee | Laterality: Right | Wound class: Clean

## 2012-06-17 MED ORDER — BISACODYL 10 MG RE SUPP: 10.0000 mg | Freq: Every day | RECTAL | Status: DC | PRN

## 2012-06-17 MED ORDER — LACTATED RINGERS IV SOLN
INTRAVENOUS | Status: DC
  Administered 2012-06-17: 17:00:00 via INTRAVENOUS
  Administered 2012-06-17: 1000 mL via INTRAVENOUS

## 2012-06-17 MED ORDER — METHOCARBAMOL 100 MG/ML IJ SOLN
500.0000 mg | Freq: Four times a day (QID) | INTRAVENOUS | Status: DC | PRN
  Administered 2012-06-17: 500 mg via INTRAVENOUS
  Filled 2012-06-17: qty 5

## 2012-06-17 MED ORDER — MIDAZOLAM HCL 5 MG/5ML IJ SOLN
INTRAMUSCULAR | Status: DC | PRN
  Administered 2012-06-17: 2 mg via INTRAVENOUS

## 2012-06-17 MED ORDER — ACETAMINOPHEN 650 MG RE SUPP: 650.0000 mg | Freq: Four times a day (QID) | RECTAL | Status: DC | PRN

## 2012-06-17 MED ORDER — BUPIVACAINE LIPOSOME 1.3 % IJ SUSP
20.0000 mL | INTRAMUSCULAR | Status: AC
  Administered 2012-06-17: 20 mL
  Filled 2012-06-17: qty 20

## 2012-06-17 MED ORDER — ACETAMINOPHEN 10 MG/ML IV SOLN: 1000.0000 mg | Freq: Once | INTRAVENOUS | Status: DC

## 2012-06-17 MED ORDER — ACETAMINOPHEN 10 MG/ML IV SOLN
1000.0000 mg | Freq: Four times a day (QID) | INTRAVENOUS | Status: AC
  Administered 2012-06-18 (×3): 1000 mg via INTRAVENOUS
  Filled 2012-06-17 (×7): qty 100

## 2012-06-17 MED ORDER — METOCLOPRAMIDE HCL 5 MG PO TABS
5.0000 mg | ORAL_TABLET | Freq: Three times a day (TID) | ORAL | Status: DC | PRN
  Filled 2012-06-17: qty 2

## 2012-06-17 MED ORDER — RIVAROXABAN 10 MG PO TABS
10.0000 mg | ORAL_TABLET | Freq: Every day | ORAL | Status: DC
  Administered 2012-06-18 – 2012-06-19 (×2): 10 mg via ORAL
  Filled 2012-06-17 (×3): qty 1

## 2012-06-17 MED ORDER — BUPIVACAINE HCL (PF) 0.75 % IJ SOLN
INTRAMUSCULAR | Status: DC | PRN
  Administered 2012-06-17: 12 mg

## 2012-06-17 MED ORDER — PHENYLEPHRINE HCL 10 MG/ML IJ SOLN
10.0000 mg | INTRAVENOUS | Status: DC | PRN
  Administered 2012-06-17: 40 ug/min via INTRAVENOUS

## 2012-06-17 MED ORDER — SALINE FLUSH 0.9 % IV SOLN
INTRAVENOUS | Status: DC | PRN
  Administered 2012-06-17: 50 mL

## 2012-06-17 MED ORDER — HYDROMORPHONE HCL PF 1 MG/ML IJ SOLN: 0.2500 mg | INTRAMUSCULAR | Status: DC | PRN

## 2012-06-17 MED ORDER — CEFAZOLIN SODIUM-DEXTROSE 2-3 GM-% IV SOLR: 2.0000 g | INTRAVENOUS | Status: DC

## 2012-06-17 MED ORDER — METHOCARBAMOL 500 MG PO TABS
500.0000 mg | ORAL_TABLET | Freq: Four times a day (QID) | ORAL | Status: DC | PRN
  Administered 2012-06-19: 500 mg via ORAL
  Filled 2012-06-17: qty 1

## 2012-06-17 MED ORDER — STERILE WATER FOR IRRIGATION IR SOLN
Status: DC | PRN
  Administered 2012-06-17: 3000 mL

## 2012-06-17 MED ORDER — PHENOL 1.4 % MT LIQD: 1.0000 | OROMUCOSAL | Status: DC | PRN

## 2012-06-17 MED ORDER — 0.9 % SODIUM CHLORIDE (POUR BTL) OPTIME
TOPICAL | Status: DC | PRN
  Administered 2012-06-17: 1000 mL

## 2012-06-17 MED ORDER — DEXAMETHASONE 6 MG PO TABS
10.0000 mg | ORAL_TABLET | Freq: Once | ORAL | Status: AC
  Administered 2012-06-18: 10 mg via ORAL
  Filled 2012-06-17: qty 1

## 2012-06-17 MED ORDER — VANCOMYCIN HCL IN DEXTROSE 1-5 GM/200ML-% IV SOLN
1000.0000 mg | Freq: Once | INTRAVENOUS | Status: AC
  Administered 2012-06-17: 1000 mg via INTRAVENOUS

## 2012-06-17 MED ORDER — KCL IN DEXTROSE-NACL 20-5-0.45 MEQ/L-%-% IV SOLN
INTRAVENOUS | Status: DC
  Administered 2012-06-17: 22:00:00 via INTRAVENOUS
  Filled 2012-06-17 (×3): qty 1000

## 2012-06-17 MED ORDER — CHLORHEXIDINE GLUCONATE 4 % EX LIQD
60.0000 mL | Freq: Once | CUTANEOUS | Status: DC
  Filled 2012-06-17: qty 60

## 2012-06-17 MED ORDER — METOCLOPRAMIDE HCL 5 MG/ML IJ SOLN: 5.0000 mg | Freq: Three times a day (TID) | INTRAMUSCULAR | Status: DC | PRN

## 2012-06-17 MED ORDER — ACETAMINOPHEN 10 MG/ML IV SOLN
INTRAVENOUS | Status: DC | PRN
  Administered 2012-06-17: 1000 mg via INTRAVENOUS

## 2012-06-17 MED ORDER — DEXAMETHASONE SODIUM PHOSPHATE 10 MG/ML IJ SOLN: 10.0000 mg | Freq: Once | INTRAMUSCULAR | Status: AC

## 2012-06-17 MED ORDER — TRAMADOL HCL 50 MG PO TABS
50.0000 mg | ORAL_TABLET | Freq: Four times a day (QID) | ORAL | Status: DC | PRN
  Administered 2012-06-19: 50 mg via ORAL
  Filled 2012-06-17: qty 1

## 2012-06-17 MED ORDER — FUROSEMIDE 40 MG PO TABS
40.0000 mg | ORAL_TABLET | Freq: Every day | ORAL | Status: DC
  Filled 2012-06-17 (×3): qty 1

## 2012-06-17 MED ORDER — POLYETHYLENE GLYCOL 3350 17 G PO PACK: 17.0000 g | PACK | Freq: Every day | ORAL | Status: DC | PRN

## 2012-06-17 MED ORDER — ACETAMINOPHEN 325 MG PO TABS: 650.0000 mg | ORAL_TABLET | Freq: Four times a day (QID) | ORAL | Status: DC | PRN

## 2012-06-17 MED ORDER — PROPOFOL INFUSION 10 MG/ML OPTIME
INTRAVENOUS | Status: DC | PRN
  Administered 2012-06-17: 75 ug/kg/min via INTRAVENOUS

## 2012-06-17 MED ORDER — ONDANSETRON HCL 4 MG PO TABS: 4.0000 mg | ORAL_TABLET | Freq: Four times a day (QID) | ORAL | Status: DC | PRN

## 2012-06-17 MED ORDER — BUPIVACAINE ON-Q PAIN PUMP (FOR ORDER SET NO CHG)
INJECTION | Status: DC
  Filled 2012-06-17: qty 1

## 2012-06-17 MED ORDER — ATORVASTATIN CALCIUM 40 MG PO TABS
40.0000 mg | ORAL_TABLET | Freq: Every day | ORAL | Status: DC
  Administered 2012-06-17 – 2012-06-18 (×2): 40 mg via ORAL
  Filled 2012-06-17 (×4): qty 1

## 2012-06-17 MED ORDER — MORPHINE SULFATE 10 MG/ML IJ SOLN: 1.0000 mg | INTRAMUSCULAR | Status: DC | PRN

## 2012-06-17 MED ORDER — METOPROLOL TARTRATE 25 MG PO TABS
25.0000 mg | ORAL_TABLET | Freq: Two times a day (BID) | ORAL | Status: DC
  Administered 2012-06-17 – 2012-06-19 (×4): 25 mg via ORAL
  Filled 2012-06-17 (×7): qty 1

## 2012-06-17 MED ORDER — PROPOFOL 10 MG/ML IV BOLUS
INTRAVENOUS | Status: DC | PRN
  Administered 2012-06-17: 30 mg via INTRAVENOUS

## 2012-06-17 MED ORDER — PROMETHAZINE HCL 25 MG/ML IJ SOLN: 6.2500 mg | INTRAMUSCULAR | Status: DC | PRN

## 2012-06-17 MED ORDER — FENTANYL CITRATE 0.05 MG/ML IJ SOLN
INTRAMUSCULAR | Status: DC | PRN
  Administered 2012-06-17: 50 ug via INTRAVENOUS

## 2012-06-17 MED ORDER — NITROGLYCERIN 0.4 MG SL SUBL: 0.4000 mg | SUBLINGUAL_TABLET | SUBLINGUAL | Status: DC | PRN

## 2012-06-17 MED ORDER — VANCOMYCIN HCL IN DEXTROSE 1-5 GM/200ML-% IV SOLN
1000.0000 mg | Freq: Two times a day (BID) | INTRAVENOUS | Status: AC
  Administered 2012-06-18: 1000 mg via INTRAVENOUS
  Filled 2012-06-17: qty 200

## 2012-06-17 MED ORDER — DOCUSATE SODIUM 100 MG PO CAPS
100.0000 mg | ORAL_CAPSULE | Freq: Two times a day (BID) | ORAL | Status: DC
  Administered 2012-06-17 – 2012-06-19 (×4): 100 mg via ORAL

## 2012-06-17 MED ORDER — SODIUM CHLORIDE 0.9 % IV SOLN: INTRAVENOUS | Status: DC

## 2012-06-17 MED ORDER — ONDANSETRON HCL 4 MG/2ML IJ SOLN: 4.0000 mg | Freq: Four times a day (QID) | INTRAMUSCULAR | Status: DC | PRN

## 2012-06-17 MED ORDER — DIPHENHYDRAMINE HCL 12.5 MG/5ML PO ELIX: 12.5000 mg | ORAL_SOLUTION | ORAL | Status: DC | PRN

## 2012-06-17 MED ORDER — FLEET ENEMA 7-19 GM/118ML RE ENEM: 1.0000 | ENEMA | Freq: Once | RECTAL | Status: AC | PRN

## 2012-06-17 MED ORDER — MENTHOL 3 MG MT LOZG: 1.0000 | LOZENGE | OROMUCOSAL | Status: DC | PRN

## 2012-06-17 MED ORDER — HYDROMORPHONE HCL 2 MG PO TABS
2.0000 mg | ORAL_TABLET | ORAL | Status: DC | PRN
  Administered 2012-06-17: 2 mg via ORAL
  Administered 2012-06-18 – 2012-06-19 (×2): 4 mg via ORAL
  Filled 2012-06-17 (×2): qty 2
  Filled 2012-06-17: qty 1

## 2012-06-17 SURGICAL SUPPLY — 60 items
BAG SPEC THK2 15X12 ZIP CLS (MISCELLANEOUS)
BAG ZIPLOCK 12X15 (MISCELLANEOUS) ×1 IMPLANT
BANDAGE ELASTIC 6 VELCRO ST LF (GAUZE/BANDAGES/DRESSINGS) ×2 IMPLANT
BANDAGE ESMARK 6X9 LF (GAUZE/BANDAGES/DRESSINGS) ×1 IMPLANT
BLADE SAG 18X100X1.27 (BLADE) ×2 IMPLANT
BLADE SAW SGTL 11.0X1.19X90.0M (BLADE) ×2 IMPLANT
BNDG CMPR 9X6 STRL LF SNTH (GAUZE/BANDAGES/DRESSINGS) ×1
BNDG ESMARK 6X9 LF (GAUZE/BANDAGES/DRESSINGS) ×2
BOWL SMART MIX CTS (DISPOSABLE) ×2 IMPLANT
CATH KIT ON-Q SILVERSOAK 5 (CATHETERS) ×1 IMPLANT
CATH KIT ON-Q SILVERSOAK 5IN (CATHETERS) IMPLANT
CEMENT HV SMART SET (Cement) ×4 IMPLANT
CLOTH BEACON ORANGE TIMEOUT ST (SAFETY) ×2 IMPLANT
CUFF TOURN SGL QUICK 34 (TOURNIQUET CUFF) ×2
CUFF TRNQT CYL 34X4X40X1 (TOURNIQUET CUFF) ×1 IMPLANT
DRAPE EXTREMITY T 121X128X90 (DRAPE) ×2 IMPLANT
DRAPE POUCH INSTRU U-SHP 10X18 (DRAPES) ×2 IMPLANT
DRAPE U-SHAPE 47X51 STRL (DRAPES) ×2 IMPLANT
DRSG ADAPTIC 3X8 NADH LF (GAUZE/BANDAGES/DRESSINGS) ×2 IMPLANT
DRSG PAD ABDOMINAL 8X10 ST (GAUZE/BANDAGES/DRESSINGS) ×1 IMPLANT
DURAPREP 26ML APPLICATOR (WOUND CARE) ×2 IMPLANT
ELECT REM PT RETURN 9FT ADLT (ELECTROSURGICAL) ×2
ELECTRODE REM PT RTRN 9FT ADLT (ELECTROSURGICAL) ×1 IMPLANT
EVACUATOR 1/8 PVC DRAIN (DRAIN) ×2 IMPLANT
FACESHIELD LNG OPTICON STERILE (SAFETY) ×10 IMPLANT
GLOVE BIO SURGEON STRL SZ7.5 (GLOVE) ×2 IMPLANT
GLOVE BIO SURGEON STRL SZ8 (GLOVE) ×2 IMPLANT
GLOVE BIOGEL PI IND STRL 6 (GLOVE) IMPLANT
GLOVE BIOGEL PI IND STRL 6.5 (GLOVE) IMPLANT
GLOVE BIOGEL PI IND STRL 8 (GLOVE) ×2 IMPLANT
GLOVE BIOGEL PI INDICATOR 6 (GLOVE) ×1
GLOVE BIOGEL PI INDICATOR 6.5 (GLOVE) ×1
GLOVE BIOGEL PI INDICATOR 8 (GLOVE) ×2
GLOVE SURG SS PI 6.5 STRL IVOR (GLOVE) ×6 IMPLANT
GOWN STRL NON-REIN LRG LVL3 (GOWN DISPOSABLE) ×5 IMPLANT
GOWN STRL REIN XL XLG (GOWN DISPOSABLE) ×2 IMPLANT
HANDPIECE INTERPULSE COAX TIP (DISPOSABLE) ×2
IMMOBILIZER KNEE 20 (SOFTGOODS) ×2
IMMOBILIZER KNEE 20 THIGH 36 (SOFTGOODS) ×1 IMPLANT
KIT BASIN OR (CUSTOM PROCEDURE TRAY) ×2 IMPLANT
MANIFOLD NEPTUNE II (INSTRUMENTS) ×2 IMPLANT
NEEDLE 27GAX1X1/2 (NEEDLE) ×2 IMPLANT
NS IRRIG 1000ML POUR BTL (IV SOLUTION) ×2 IMPLANT
PACK TOTAL JOINT (CUSTOM PROCEDURE TRAY) ×2 IMPLANT
PAD ABD 7.5X8 STRL (GAUZE/BANDAGES/DRESSINGS) ×2 IMPLANT
PADDING CAST COTTON 6X4 STRL (CAST SUPPLIES) ×6 IMPLANT
POSITIONER SURGICAL ARM (MISCELLANEOUS) ×2 IMPLANT
SET HNDPC FAN SPRY TIP SCT (DISPOSABLE) ×1 IMPLANT
SPONGE GAUZE 4X4 12PLY (GAUZE/BANDAGES/DRESSINGS) ×2 IMPLANT
STRIP CLOSURE SKIN 1/2X4 (GAUZE/BANDAGES/DRESSINGS) ×4 IMPLANT
SUCTION FRAZIER 12FR DISP (SUCTIONS) ×2 IMPLANT
SUT MNCRL AB 4-0 PS2 18 (SUTURE) ×2 IMPLANT
SUT VIC AB 2-0 CT1 27 (SUTURE) ×8
SUT VIC AB 2-0 CT1 TAPERPNT 27 (SUTURE) ×3 IMPLANT
SUT VLOC 180 0 24IN GS25 (SUTURE) ×2 IMPLANT
SYR 50ML LL SCALE MARK (SYRINGE) ×2 IMPLANT
TOWEL OR 17X26 10 PK STRL BLUE (TOWEL DISPOSABLE) ×4 IMPLANT
TRAY FOLEY CATH 14FRSI W/METER (CATHETERS) ×2 IMPLANT
WATER STERILE IRR 1500ML POUR (IV SOLUTION) ×2 IMPLANT
WRAP KNEE MAXI GEL POST OP (GAUZE/BANDAGES/DRESSINGS) ×4 IMPLANT

## 2012-06-17 NOTE — Op Note (Signed)
Pre-operative diagnosis- Osteoarthritis  Right knee(s)  Post-operative diagnosis- Osteoarthritis Right knee(s)  Procedure-  Right  Total Knee Arthroplasty  Surgeon- Dione Plover. Dru Primeau, MD  Assistant- Arlee Muslim, PA-C   Anesthesia-  Spinal EBL-* No blood loss amount entered *  Drains Hemovac  Tourniquet time-  Total Tourniquet Time Documented: Thigh (Right) - 36 minutes   Complications- None  Condition-PACU - hemodynamically stable.   Brief Clinical Note  Isabella [name] is a 72 y.o. year old female with end stage OA of her right knee with progressively worsening pain and dysfunction. She has constant pain, with activity and at rest and significant functional deficits with difficulties even with ADLs. She has had extensive non-op management including analgesics, injections of cortisone and viscosupplements, and home exercise program, but remains in significant pain with significant dysfunction.Radiographs show bone on bone arthritis medial and patellofemoral. She presents now for right Total Knee Arthroplasty.    Procedure in detail---   The patient is brought into the operating room and positioned supine on the operating table. After successful administration of  Spinal,   a tourniquet is placed high on the  Right thigh(s) and the lower extremity is prepped and draped in the usual sterile fashion. Time out is performed by the operating team and then the  Right lower extremity is wrapped in Esmarch, knee flexed and the tourniquet inflated to 300 mmHg.       A midline incision is made with a ten blade through the subcutaneous tissue to the level of the extensor mechanism. A fresh blade is used to make a medial parapatellar arthrotomy. Soft tissue over the proximal medial tibia is subperiosteally elevated to the joint line with a knife and into the semimembranosus bursa with a Cobb elevator. Soft tissue over the proximal lateral tibia is elevated with attention being paid to avoiding the  patellar tendon on the tibial tubercle. The patella is everted, knee flexed 90 degrees and the ACL and PCL are removed. Findings are bone on bone medial and patellofemoral with large medial and patellar osteophytes.        The drill is used to create a starting hole in the distal femur and the canal is thoroughly irrigated with sterile saline to remove the fatty contents. The 5 degree Right  valgus alignment guide is placed into the femoral canal and the distal femoral cutting block is pinned to remove 10 mm off the distal femur. Resection is made with an oscillating saw.      The tibia is subluxed forward and the menisci are removed. The extramedullary alignment guide is placed referencing proximally at the medial aspect of the tibial tubercle and distally along the second metatarsal axis and tibial crest. The block is pinned to remove 55m off the more deficient medial  side. Resection is made with an oscillating saw. Size 3is the most appropriate size for the tibia and the proximal tibia is prepared with the modular drill and keel punch for that size.      The femoral sizing guide is placed and size 3 is most appropriate. Rotation is marked off the epicondylar axis and confirmed by creating a rectangular flexion gap at 90 degrees. The size 3 cutting block is pinned in this rotation and the anterior, posterior and chamfer cuts are made with the oscillating saw. The intercondylar block is then placed and that cut is made.      Trial size 3 tibial component, trial size 3 posterior stabilized femur and a 12.5  mm posterior stabilized rotating platform insert trial is placed. Full extension is achieved with excellent varus/valgus and anterior/posterior balance throughout full range of motion. The patella is everted and thickness measured to be 22  mm. Free hand resection is taken to 12 mm, a 35 template is placed, lug holes are drilled, trial patella is placed, and it tracks normally. Osteophytes are removed off  the posterior femur with the trial in place. All trials are removed and the cut bone surfaces prepared with pulsatile lavage. Cement is mixed and once ready for implantation, the size 3 tibial implant, size  3 posterior stabilized femoral component, and the size 35 patella are cemented in place and the patella is held with the clamp. The trial insert is placed and the knee held in full extension. The Exparel (20 ml mixed with 50 ml saline) is injected into the extensor mechanism, posterior capsule, medial and lateral gutters and subcutaneous tissues.  All extruded cement is removed and once the cement is hard the permanent 12.5 mm posterior stabilized rotating platform insert is placed into the tibial tray.      The wound is copiously irrigated with saline solution and the extensor mechanism closed over a hemovac drain with #1 PDS suture. The tourniquet is released for a total tourniquet time of 36  minutes. Flexion against gravity is 140 degrees and the patella tracks normally. Subcutaneous tissue is closed with 2.0 vicryl and subcuticular with running 4.0 Monocryl. The incision is cleaned and dried and steri-strips and a bulky sterile dressing are applied. The limb is placed into a knee immobilizer and the patient is awakened and transported to recovery in stable condition.      Please note that a surgical assistant was a medical necessity for this procedure in order to perform it in a safe and expeditious manner. Surgical assistant was necessary to retract the ligaments and vital neurovascular structures to prevent injury to them and also necessary for proper positioning of the limb to allow for anatomic placement of the prosthesis.   Dione Plover Brendy Ficek, MD    06/17/2012, 5:17 PM

## 2012-06-17 NOTE — Interval H&P Note (Signed)
History and Physical Interval Note:  06/17/2012 3:16 PM  Isabella [name]  has presented today for surgery, with the diagnosis of OSTEOARTHRITIS OF THE RIGHT KNEE  The various methods of treatment have been discussed with the patient and family. After consideration of risks, benefits and other options for treatment, the patient has consented to  Procedure(s) (LRB) with comments: TOTAL KNEE ARTHROPLASTY (Right) as a surgical intervention .  The patient's history has been reviewed, patient examined, no change in status, stable for surgery.  I have reviewed the patient's chart and labs.  Questions were answered to the patient's satisfaction.     Gearlean Alf

## 2012-06-17 NOTE — Anesthesia Procedure Notes (Signed)
Spinal  Patient location during procedure: OR Start time: 06/17/2012 4:06 PM End time: 06/17/2012 4:12 PM Staffing CRNA/Resident: Anne Fu Performed by: resident/CRNA  Preanesthetic Checklist Completed: patient identified, site marked, surgical consent, pre-op evaluation, timeout performed, IV checked, risks and benefits discussed and monitors and equipment checked Spinal Block Patient position: sitting Prep: Betadine Patient monitoring: heart rate, continuous pulse ox and blood pressure Location: L2-3 Injection technique: single-shot Needle Needle type: Spinocan  Needle gauge: 22 G Needle length: 9 cm Needle insertion depth: 7 cm Assessment Sensory level: T4 Additional Notes Expiration date of kit checked and confirmed. Patient tolerated procedure well, without complications. X 1 attempt with clear CSF return easy administration of medication with noted T4 level post placement.

## 2012-06-17 NOTE — Anesthesia Postprocedure Evaluation (Signed)
  Anesthesia Post-op Note  Patient: Isabella [name]  Procedure(s) Performed: Procedure(s) (LRB): TOTAL KNEE ARTHROPLASTY (Right)  Patient Location: PACU  Anesthesia Type: General  Level of Consciousness: awake and alert   Airway and Oxygen Therapy: Patient Spontanous Breathing  Post-op Pain: mild  Post-op Assessment: Post-op Vital signs reviewed, Patient's Cardiovascular Status Stable, Respiratory Function Stable, Patent Airway and No signs of Nausea or vomiting  Last Vitals:  Filed Vitals:   06/17/12 1845  BP: 152/81  Pulse: 50  Temp: 36.4 C  Resp: 16    Post-op Vital Signs: stable   Complications: No apparent anesthesia complications

## 2012-06-17 NOTE — Transfer of Care (Signed)
Immediate Anesthesia Transfer of Care Note  Patient: Isabella [name]  Procedure(s) Performed: Procedure(s) (LRB) with comments: TOTAL KNEE ARTHROPLASTY (Right)  Patient Location: PACU  Anesthesia Type:General  Level of Consciousness: awake and alert   Airway & Oxygen Therapy: Patient Spontanous Breathing and Patient connected to face mask oxygen  Post-op Assessment: Report given to PACU RN and Post -op Vital signs reviewed and stable  Post vital signs: Reviewed and stable  Complications: No apparent anesthesia complications

## 2012-06-17 NOTE — Anesthesia Preprocedure Evaluation (Signed)
Anesthesia Evaluation  Patient identified by MRN, date of birth, ID band Patient awake    Reviewed: Allergy & Precautions, H&P , NPO status , Patient's Chart, lab work & pertinent test results  Airway Mallampati: III TM Distance: <3 FB Neck ROM: Full    Dental No notable dental hx.    Pulmonary sleep apnea ,  breath sounds clear to auscultation  Pulmonary exam normal       Cardiovascular hypertension, + Past MI and + Cardiac Stents Rhythm:Regular Rate:Normal     Neuro/Psych negative neurological ROS  negative psych ROS   GI/Hepatic negative GI ROS, Neg liver ROS,   Endo/Other  Morbid obesity  Renal/GU negative Renal ROS  negative genitourinary   Musculoskeletal negative musculoskeletal ROS (+)   Abdominal   Peds negative pediatric ROS (+)  Hematology negative hematology ROS (+)   Anesthesia Other Findings   Reproductive/Obstetrics negative OB ROS                           Anesthesia Physical Anesthesia Plan  ASA: III  Anesthesia Plan: Spinal   Post-op Pain Management:    Induction:   Airway Management Planned: Nasal Cannula  Additional Equipment:   Intra-op Plan:   Post-operative Plan:   Informed Consent: I have reviewed the patients History and Physical, chart, labs and discussed the procedure including the risks, benefits and alternatives for the proposed anesthesia with the patient or authorized representative who has indicated his/her understanding and acceptance.     Plan Discussed with: CRNA and Surgeon  Anesthesia Plan Comments:         Anesthesia Quick Evaluation

## 2012-06-18 ENCOUNTER — Ambulatory Visit: Payer: Medicare Other | Admitting: Pulmonary Disease

## 2012-06-18 LAB — CBC
MCH: 29.3 pg (ref 26.0–34.0)
MCHC: 32.9 g/dL (ref 30.0–36.0)
Platelets: 184 10*3/uL (ref 150–400)
RBC: 4.16 MIL/uL (ref 3.87–5.11)
RDW: 13.9 % (ref 11.5–15.5)

## 2012-06-18 LAB — BASIC METABOLIC PANEL
Calcium: 8.1 mg/dL — ABNORMAL LOW (ref 8.4–10.5)
GFR calc Af Amer: >90 mL/min (ref >90)
GFR calc non Af Amer: >90 mL/min (ref >90)
Sodium: 137 mEq/L (ref 135–145)

## 2012-06-18 NOTE — Progress Notes (Signed)
Utilization review completed.  

## 2012-06-18 NOTE — Evaluation (Signed)
Physical Therapy Evaluation Patient Details Name: Isabella [name] MRN: [id number] DOB: 29-Aug-1940 Today's Date: 06/18/2012 Time: 1140-1155 PT Time Calculation (min): 15 min  PT Assessment / Plan / Recommendation Clinical Impression  72 yo female s/p R TKA. On eval, pt required Min assist for mobility. Pt plans to d/c home with assist of husband. Recommend HHPT.     PT Assessment  Patient needs continued PT services    Follow Up Recommendations  Home health PT    Does the patient have the potential to tolerate intense rehabilitation      Barriers to Discharge        Equipment Recommendations  None recommended by PT    Recommendations for Other Services OT consult   Frequency 7X/week    Precautions / Restrictions Precautions Precautions: None Required Braces or Orthoses: Knee Immobilizer - Right Knee Immobilizer - Right: Discontinue once straight leg raise with < 10 degree lag Restrictions Weight Bearing Restrictions: No RLE Weight Bearing: Weight bearing as tolerated   Pertinent Vitals/Pain 5/10 R knee      Mobility  Bed Mobility Bed Mobility: Supine to Sit Supine to Sit: 4: Min assist Details for Bed Mobility Assistance: Assist for R LE off bed.  Transfers Transfers: Sit to Stand;Stand to Sit Sit to Stand: 4: Min assist;From bed;From elevated surface;With upper extremity assist Stand to Sit: 4: Min assist;To chair/3-in-1;With armrests;With upper extremity assist Details for Transfer Assistance: VCs safety, technique, hand placement. Assist to rise, stabillize, control descent Ambulation/Gait Ambulation/Gait Assistance: 4: Min assist Ambulation Distance (Feet): 20 Feet Assistive device: Rolling walker Ambulation/Gait Assistance Details: VCsafety, sequence. Assist to stabilize throughout ambulation.  Gait Pattern: Step-to pattern;Decreased stride length;Antalgic    Exercises     PT Diagnosis: Difficulty walking;Abnormality of gait;Acute pain  PT Problem List:  Decreased strength;Decreased range of motion;Decreased mobility;Decreased knowledge of use of DME;Decreased knowledge of precautions PT Treatment Interventions: DME instruction;Gait training;Stair training;Functional mobility training;Therapeutic activities;Therapeutic exercise;Patient/family education   PT Goals Acute Rehab PT Goals PT Goal Formulation: With patient Time For Goal Achievement: 06/25/12 Potential to Achieve Goals: Good Pt will go Supine/Side to Sit: with supervision PT Goal: Supine/Side to Sit - Progress: Progressing toward goal Pt will go Sit to Supine/Side: with supervision PT Goal: Sit to Supine/Side - Progress: Progressing toward goal Pt will go Sit to Stand: with supervision PT Goal: Sit to Stand - Progress: Progressing toward goal Pt will Ambulate: 51 - 150 feet;with supervision;with rolling walker PT Goal: Ambulate - Progress: Progressing toward goal Pt will Go Up / Down Stairs: 1-2 stairs;with supervision;with rolling walker PT Goal: Up/Down Stairs - Progress: Goal set today Pt will Perform Home Exercise Program: with supervision, verbal cues required/provided PT Goal: Perform Home Exercise Program - Progress: Goal set today  Visit Information  Last PT Received On: 06/18/12 Assistance Needed: +1    Subjective Data  Subjective: "It feels better now that I walked on it a little" Patient Stated Goal: Home   Prior [place] Lives With: Spouse Available Help at Discharge: Family Type of Home: House Home Access: Stairs to enter Technical brewer of Steps: 1 Entrance Stairs-Rails: None Home Layout: One level Bathroom Shower/Tub: Museum/gallery exhibitions officer Equipment: Bedside commode/3-in-1;Walker - rolling;Straight cane Prior Function Level of Independence: Independent Communication Communication: No difficulties    Cognition  Cognition Overall Cognitive Status: Appears within functional limits for tasks  assessed/performed Arousal/Alertness: Awake/alert Orientation Level: Appears intact for tasks assessed Behavior During Session: [facility] for tasks performed  Extremity/Trunk Assessment Right Lower Extremity Assessment RLE ROM/Strength/Tone: Deficits RLE ROM/Strength/Tone Deficits: SLR 2+/5, moves ankle well RLE Sensation:  (Pt c/o R foot numbness) Left Lower Extremity Assessment LLE ROM/Strength/Tone: WFL for tasks assessed Trunk Assessment Trunk Assessment: Normal   Balance    End of Session PT - End of Session Equipment Utilized During Treatment: Gait belt Activity Tolerance: Patient tolerated treatment well Patient left: in chair;with call bell/phone within reach;with family/visitor present  GP     Weston Anna [facility] 06/18/2012, 1:38 PM [phone]

## 2012-06-18 NOTE — Care Management Note (Addendum)
    Page 1 of 2   06/19/2012     5:01:48 PM   CARE MANAGEMENT NOTE 06/19/2012  Patient:  Isabella [name], Isabella [name]   Account Number:  [id number]  Date Initiated:  06/18/2012  Documentation initiated by:  Sherrin Daisy  Subjective/Objective Assessment:   dx osteoarthritis right knee; total knee replacemnt     Action/Plan:   CM spoke with patient and daughter. Current plans are for patient to return to her home in [place] where spouse and daughter will be her caregivers, She already has RW and commode seat. She is requesting [place] for [facility] services.   Anticipated DC Date:  06/20/2012   Anticipated DC Plan:  [place]  In-house referral  NA      DC Planning Services  CM consult      [facility] Choice  HOME HEALTH   Choice offered to / List presented to:  C-1 Patient   DME arranged  NA      DME agency  NA     [place] arranged  HH-2 PT      [place].   Status of service:  Completed, signed off Medicare Important Message given?  NA - LOS <3 / Initial given by admissions (If response is "NO", the following Medicare IM given date fields will be blank) Date Medicare IM given:   Date Additional Medicare IM given:    Discharge Disposition:  [place]  Per UR Regulation:  Reviewed for med. necessity/level of care/duration of stay  If discussed at [place] of Stay Meetings, dates discussed:    Comments:  06/19/2012 Sherrin Daisy BSN RN CCM [phone] Pt discharged to home with [place] in place. Services will start tomorrow 06/20/2012 06/18/2012 Sherrin Daisy BSN RN CCM [phone] [facility] notified of request for services. They will be able to provide services. CM will follow.

## 2012-06-18 NOTE — Progress Notes (Signed)
Physical Therapy Treatment Patient Details Name: Isabella [name] MRN: [id number] DOB: 23-May-1940 Today's Date: 06/18/2012 Time: 4680-3212 PT Time Calculation (min): 9 min  PT Assessment / Plan / Recommendation Comments on Treatment Session       Follow Up Recommendations  Home health PT     Does the patient have the potential to tolerate intense rehabilitation     Barriers to Discharge        Equipment Recommendations  None recommended by PT    Recommendations for Other Services OT consult  Frequency 7X/week   Plan Discharge plan remains appropriate    Precautions / Restrictions Precautions Precautions: None Required Braces or Orthoses: Knee Immobilizer - Right Knee Immobilizer - Right: Discontinue once straight leg raise with < 10 degree lag Restrictions Weight Bearing Restrictions: No RLE Weight Bearing: Weight bearing as tolerated   Pertinent Vitals/Pain 7/10 R anterior knee "burning"    Mobility    Exercises Total Joint Exercises Ankle Circles/Pumps: AROM;Both;10 reps;Supine Quad Sets: AROM;Both;10 reps;Supine Short Arc Quad: AROM;Right;10 reps;Supine Heel Slides: AAROM;Right;10 reps;Supine Hip ABduction/ADduction: AAROM;Right;10 reps;Supine Straight Leg Raises: AROM;Right;10 reps;Supine   PT Diagnosis: Difficulty walking;Abnormality of gait;Acute pain  PT Problem List: Decreased strength;Decreased range of motion;Decreased mobility;Decreased knowledge of use of DME;Decreased knowledge of precautions PT Treatment Interventions: DME instruction;Gait training;Stair training;Functional mobility training;Therapeutic activities;Therapeutic exercise;Patient/family education   PT Goals Acute Rehab PT Goals PT Goal Formulation: With patient Time For Goal Achievement: 06/25/12 Potential to Achieve Goals: Good Pt will go Supine/Side to Sit: with supervision PT Goal: Supine/Side to Sit - Progress: Progressing toward goal Pt will go Sit to Supine/Side: with supervision PT  Goal: Sit to Supine/Side - Progress: Progressing toward goal Pt will go Sit to Stand: with supervision PT Goal: Sit to Stand - Progress: Progressing toward goal Pt will Ambulate: 51 - 150 feet;with supervision PT Goal: Ambulate - Progress: Progressing toward goal Pt will Go Up / Down Stairs: 1-2 stairs;with supervision;with rolling walker PT Goal: Up/Down Stairs - Progress: Goal set today Pt will Perform Home Exercise Program: with supervision, verbal cues required/provided PT Goal: Perform Home Exercise Program - Progress: Progressing toward goal  Visit Information  Last PT Received On: 06/18/12 Assistance Needed: +1    Subjective Data   Patient Stated Goal: Home   Cognition  Cognition Overall Cognitive Status: Appears within functional limits for tasks assessed/performed Arousal/Alertness: Awake/alert Orientation Level: Appears intact for tasks assessed Behavior During Session: [facility] for tasks performed    Balance     End of Session PT - End of Session Equipment Utilized During Treatment: Gait belt Activity Tolerance: Patient tolerated treatment well Patient left: in bed;with call bell/phone within reach;with family/visitor present   GP     Weston Anna [facility] 06/18/2012, 3:49 PM [phone]

## 2012-06-18 NOTE — Progress Notes (Signed)
Physical Therapy Treatment Patient Details Name: ANDREKA STUCKI MRN: [id number] DOB: 28-Feb-1941 Today's Date: 06/18/2012 Time: 1440-1455 PT Time Calculation (min): 15 min  PT Assessment / Plan / Recommendation Comments on Treatment Session       Follow Up Recommendations  Home health PT     Does the patient have the potential to tolerate intense rehabilitation     Barriers to Discharge        Equipment Recommendations  None recommended by PT    Recommendations for Other Services OT consult  Frequency 7X/week   Plan      Precautions / Restrictions Precautions Precautions: None Required Braces or Orthoses: Knee Immobilizer - Right Knee Immobilizer - Right: Discontinue once straight leg raise with < 10 degree lag Restrictions Weight Bearing Restrictions: No RLE Weight Bearing: Weight bearing as tolerated   Pertinent Vitals/Pain 7/10 anterior knee    Mobility  Bed Mobility Bed Mobility:  Sit to Supine: 4: Min assist Details for Bed Mobility Assistance: Assist for R LE onto bed.  Transfers Transfers: Sit to Stand;Stand to Sit Sit to Stand: 4: Min assist;From bed;From elevated surface;With upper extremity assist Stand to Sit: : Min guard assist;To chair/3-in-1;With armrests;With upper extremity assist Details for Transfer Assistance: VCs safety, technique, hand placement. Assist to rise, stabillize, control descent Ambulation/Gait Ambulation/Gait Assistance: 4: Min guard assist Ambulation Distance (Feet): 40 Feet Assistive device: Rolling walker Ambulation/Gait Assistance Details: VCsafety, sequence. Assist to stabilize throughout ambulation.  Gait Pattern: Step-to pattern;Decreased stride length;Antalgic    Exercises     PT Diagnosis: Difficulty walking;Abnormality of gait;Acute pain  PT Problem List: Decreased strength;Decreased range of motion;Decreased mobility;Decreased knowledge of use of DME;Decreased knowledge of precautions PT Treatment Interventions: DME  instruction;Gait training;Stair training;Functional mobility training;Therapeutic activities;Therapeutic exercise;Patient/family education   PT Goals Acute Rehab PT Goals PT Goal Formulation: With patient Time For Goal Achievement: 06/25/12 Potential to Achieve Goals: Good Pt will Ambulate: 51 - 150 feet;with supervision;with rolling walker PT Goal: Ambulate - Progress: Progressing toward goal  Visit Information  Last PT Received On: 06/18/12 Assistance Needed: +1    Subjective Data   Patient Stated Goal: Home   Cognition  Cognition Overall Cognitive Status: Appears within functional limits for tasks assessed/performed Arousal/Alertness: Awake/alert Orientation Level: Appears intact for tasks assessed Behavior During Session: [facility] for tasks performed    Balance     End of Session PT - End of Session Equipment Utilized During Treatment: Gait belt Activity Tolerance: Patient tolerated treatment well Patient left: in chair;with call bell/phone within reach;with family/visitor present   GP     Weston Anna [facility] 06/18/2012, 3:44 PM [phone]

## 2012-06-18 NOTE — Progress Notes (Signed)
OT Cancellation Note  Patient Details Name: Isabella [name] MRN: [id number] DOB: [birth year]   Cancelled Treatment:    Reason Eval/Treat Not Completed: Other (comment) (pt has had other knee done but hasn't moved much.  Will check tomorrow to see if pt feels she will be OK with bathroom transfers.  Has husband and neighbor to assist with adls.  )  [name] 06/18/2012, 3:24 PM Lesle Chris, OTR/L [phone] 06/18/2012

## 2012-06-18 NOTE — Progress Notes (Signed)
Pt cpap setup at this time. Pt had her machine, but agreed to wear ours with her mask/circuit. Placed on cpap 12 cmH2O per home setting & tolerating well.  Kathie Dike RRT

## 2012-06-18 NOTE — Progress Notes (Signed)
   Subjective: 1 Day Post-Op Procedure(s) (LRB): TOTAL KNEE ARTHROPLASTY (Right) Patient reports pain as mild.   Patient seen in rounds with Dr. Wynelle Link. Patient is well, and has had no acute complaints or problems We will start therapy today.  Plan is to go Home after hospital stay.  Objective: Vital signs in last 24 hours: Temp:  [97.4 F (36.3 C)-98.6 F (37 C)] 98.6 F (37 C) (02/06 0440) Pulse Rate:  [50-65] 60  (02/06 0440) Resp:  [15-20] 20  (02/06 0440) BP: (107-156)/(58-81) 129/59 mmHg (02/06 0440) SpO2:  [98 %-100 %] 98 % (02/06 0440) Weight:  [94.348 kg (208 lb)] 94.348 kg (208 lb) (02/06 0132)  Intake/Output from previous day:  Intake/Output Summary (Last 24 hours) at 06/18/12 0746 Last data filed at 06/18/12 1157  Gross per 24 hour  Intake   2325 ml  Output   1885 ml  Net    440 ml    Intake/Output this shift:    Labs:  Basename 06/18/12 0356  HGB 12.2    Basename 06/18/12 0356  WBC 9.2  RBC 4.16  HCT 37.1  PLT 184    Basename 06/18/12 0356  NA 137  K 3.8  CL 102  CO2 27  BUN 11  CREATININE 0.56  GLUCOSE 146*  CALCIUM 8.1*   No results found for this basename: LABPT:2,INR:2 in the last 72 hours  EXAM General - Patient is Alert, Appropriate and Oriented Extremity - Neurovascular intact Sensation intact distally Dorsiflexion/Plantar flexion intact Dressing - dressing C/D/I Motor Function - intact, moving foot and toes well on exam.  Hemovac pulled without difficulty.  Past Medical History  Diagnosis Date  . Hypertension   . High cholesterol   . Coronary artery disease   . Myocardial infarct 03/24/2011    Dr. [name]  . Sleep apnea     uses cpap  . Complication of anesthesia     Took a while to wake knee  . History of blood transfusion   . Lung nodule   . Arthritis     Assessment/Plan: 1 Day Post-Op Procedure(s) (LRB): TOTAL KNEE ARTHROPLASTY (Right) Principal Problem:  *OA (osteoarthritis) of knee  Estimated Body mass  index is 38.04 kg/(m^2) as calculated from the following:   Height as of this encounter: 5' 2" (1.575 m).   Weight as of this encounter: 208 lb(94.348 kg). Advance diet Up with therapy Plan for discharge tomorrow Discharge home with home health  DVT Prophylaxis - Xarelto, ASA 81 mg on hold Weight-Bearing as tolerated to right leg No vaccines. D/C O2 and Pulse OX and try on Room [address]  Mickel Crow 06/18/2012, 7:46 AM

## 2012-06-19 ENCOUNTER — Encounter (HOSPITAL_COMMUNITY): Payer: Self-pay | Admitting: Orthopedic Surgery

## 2012-06-19 ENCOUNTER — Encounter (HOSPITAL_COMMUNITY): Payer: Medicare Other

## 2012-06-19 LAB — BASIC METABOLIC PANEL
Calcium: 8.5 mg/dL (ref 8.4–10.5)
Creatinine, Ser: 0.58 mg/dL (ref 0.50–1.10)
GFR calc Af Amer: >90 mL/min (ref >90)
GFR calc non Af Amer: >90 mL/min (ref >90)
Sodium: 138 mEq/L (ref 135–145)

## 2012-06-19 LAB — CBC
MCH: 29.3 pg (ref 26.0–34.0)
MCV: 89.2 fL (ref 78.0–100.0)
Platelets: 193 10*3/uL (ref 150–400)
RDW: 13.9 % (ref 11.5–15.5)

## 2012-06-19 MED ORDER — METHOCARBAMOL 500 MG PO TABS: 500.0000 mg | ORAL_TABLET | Freq: Four times a day (QID) | ORAL | Status: DC | PRN

## 2012-06-19 MED ORDER — RIVAROXABAN 10 MG PO TABS: 10.0000 mg | ORAL_TABLET | Freq: Every day | ORAL | Status: DC

## 2012-06-19 MED ORDER — HYDROMORPHONE HCL 2 MG PO TABS: 2.0000 mg | ORAL_TABLET | ORAL | Status: DC | PRN

## 2012-06-19 NOTE — Progress Notes (Signed)
OT Cancellation Note  Patient Details Name: Isabella [name] MRN: [id number] DOB: Dec 12, 1940   Cancelled Treatment:    Reason Eval/Treat Not Completed: Other (comment) (screen:  no OT needs. Pt moving well today.  Has had previous TKA.)  Adeja Sarratt 06/19/2012, 10:12 AM Lesle Chris, OTR/L [phone] 06/19/2012

## 2012-06-19 NOTE — Discharge Summary (Signed)
Physician Discharge Summary   Patient ID: KYRSTYN GREEAR MRN: [id number] DOB/AGE: [birth year] 72 y.o.  Admit date: 06/17/2012 Discharge date: 06/19/2012  Primary Diagnosis:  Osteoarthritis Right knee  Admission Diagnoses:  Past Medical History  Diagnosis Date  . Hypertension   . High cholesterol   . Coronary artery disease   . Myocardial infarct 03/24/2011    Dr. [name]  . Sleep apnea     uses cpap  . Complication of anesthesia     Took a while to wake knee  . History of blood transfusion   . Lung nodule   . Arthritis    Discharge Diagnoses:   Principal Problem:  *OA (osteoarthritis) of knee  Estimated Body mass index is 38.04 kg/(m^2) as calculated from the following:   Height as of this encounter: 5' 2" (1.575 m).   Weight as of this encounter: 208 lb(94.348 kg).  Classification of overweight in adults according to BMI (WHO, 1998)   Procedure:  Procedure(s) (LRB): TOTAL KNEE ARTHROPLASTY (Right)   Consults: None  HPI: Isabella [name] is a 72 y.o. year old female with end stage OA of her right knee with progressively worsening pain and dysfunction. She has constant pain, with activity and at rest and significant functional deficits with difficulties even with ADLs. She has had extensive non-op management including analgesics, injections of cortisone and viscosupplements, and home exercise program, but remains in significant pain with significant dysfunction.Radiographs show bone on bone arthritis medial and patellofemoral. She presents now for right Total Knee Arthroplasty.   Laboratory Data: Admission on 06/17/2012  Component Date Value Range Status  . ABO/RH(D) 06/17/2012 A POS   Final  . Antibody Screen 06/17/2012 NEG   Final  . Sample Expiration 06/17/2012 06/20/2012   Final  . WBC 06/18/2012 9.2  4.0 - 10.5 K/uL Final  . RBC 06/18/2012 4.16  3.87 - 5.11 MIL/uL Final  . Hemoglobin 06/18/2012 12.2  12.0 - 15.0 g/dL Final  . HCT 06/18/2012 37.1  36.0 - 46.0 % Final  .  MCV 06/18/2012 89.2  78.0 - 100.0 fL Final  . MCH 06/18/2012 29.3  26.0 - 34.0 pg Final  . MCHC 06/18/2012 32.9  30.0 - 36.0 g/dL Final  . RDW 06/18/2012 13.9  11.5 - 15.5 % Final  . Platelets 06/18/2012 184  150 - 400 K/uL Final  . Sodium 06/18/2012 137  135 - 145 mEq/L Final  . Potassium 06/18/2012 3.8  3.5 - 5.1 mEq/L Final  . Chloride 06/18/2012 102  96 - 112 mEq/L Final  . CO2 06/18/2012 27  19 - 32 mEq/L Final  . Glucose, Bld 06/18/2012 146* 70 - 99 mg/dL Final  . BUN 06/18/2012 11  6 - 23 mg/dL Final  . Creatinine, Ser 06/18/2012 0.56  0.50 - 1.10 mg/dL Final  . Calcium 06/18/2012 8.1* 8.4 - 10.5 mg/dL Final  . GFR calc non Af Amer 06/18/2012 >90  >90 mL/min Final  . GFR calc Af Amer 06/18/2012 >90  >90 mL/min Final   Comment:                                 The eGFR has been calculated                          using the CKD EPI equation.  This calculation has not been                          validated in all clinical                          situations.                          eGFR's persistently                          <90 mL/min signify                          possible Chronic Kidney Disease.  . WBC 06/19/2012 14.8* 4.0 - 10.5 K/uL Final  . RBC 06/19/2012 3.99  3.87 - 5.11 MIL/uL Final  . Hemoglobin 06/19/2012 11.7* 12.0 - 15.0 g/dL Final  . HCT 06/19/2012 35.6* 36.0 - 46.0 % Final  . MCV 06/19/2012 89.2  78.0 - 100.0 fL Final  . MCH 06/19/2012 29.3  26.0 - 34.0 pg Final  . MCHC 06/19/2012 32.9  30.0 - 36.0 g/dL Final  . RDW 06/19/2012 13.9  11.5 - 15.5 % Final  . Platelets 06/19/2012 193  150 - 400 K/uL Final  . Sodium 06/19/2012 138  135 - 145 mEq/L Final  . Potassium 06/19/2012 3.8  3.5 - 5.1 mEq/L Final  . Chloride 06/19/2012 105  96 - 112 mEq/L Final  . CO2 06/19/2012 27  19 - 32 mEq/L Final  . Glucose, Bld 06/19/2012 145* 70 - 99 mg/dL Final  . BUN 06/19/2012 11  6 - 23 mg/dL Final  . Creatinine, Ser 06/19/2012 0.58  0.50 - 1.10 mg/dL  Final  . Calcium 06/19/2012 8.5  8.4 - 10.5 mg/dL Final  . GFR calc non Af Amer 06/19/2012 >90  >90 mL/min Final  . GFR calc Af Amer 06/19/2012 >90  >90 mL/min Final   Comment:                                 The eGFR has been calculated                          using the CKD EPI equation.                          This calculation has not been                          validated in all clinical                          situations.                          eGFR's persistently                          <90 mL/min signify                          possible Chronic Kidney Disease.  Hospital Outpatient Visit on 06/05/2012  Component Date Value Range Status  . MRSA, PCR 06/05/2012 NEGATIVE  NEGATIVE Final  . Staphylococcus aureus 06/05/2012 POSITIVE* NEGATIVE Final   Comment:                                 The Xpert SA Assay (FDA                          approved for NASAL specimens                          in patients over 53 years of age),                          is one component of                          a comprehensive surveillance                          program.  Test performance has                          been validated by [employer] for patients greater                          than or equal to 76 year old.                          It is not intended                          to diagnose infection nor to                          guide or monitor treatment.  Marland Kitchen aPTT 06/05/2012 34  24 - 37 seconds Final  . WBC 06/05/2012 5.7  4.0 - 10.5 K/uL Final  . RBC 06/05/2012 4.87  3.87 - 5.11 MIL/uL Final  . Hemoglobin 06/05/2012 14.3  12.0 - 15.0 g/dL Final  . HCT 06/05/2012 44.1  36.0 - 46.0 % Final  . MCV 06/05/2012 90.6  78.0 - 100.0 fL Final  . MCH 06/05/2012 29.4  26.0 - 34.0 pg Final  . MCHC 06/05/2012 32.4  30.0 - 36.0 g/dL Final  . RDW 06/05/2012 13.8  11.5 - 15.5 % Final  . Platelets 06/05/2012 215  150 - 400 K/uL Final  . Sodium 06/05/2012 141  135  - 145 mEq/L Final  . Potassium 06/05/2012 3.7  3.5 - 5.1 mEq/L Final  . Chloride 06/05/2012 100  96 - 112 mEq/L Final  . CO2 06/05/2012 33* 19 - 32 mEq/L Final  . Glucose, Bld 06/05/2012 96  70 - 99 mg/dL Final  . BUN 06/05/2012 12  6 - 23 mg/dL Final  . Creatinine, Ser 06/05/2012 0.73  0.50 - 1.10 mg/dL Final  . Calcium 06/05/2012 9.0  8.4 - 10.5 mg/dL Final  . Total Protein 06/05/2012 7.0  6.0 - 8.3 g/dL Final  .  Albumin 06/05/2012 3.7  3.5 - 5.2 g/dL Final  . AST 06/05/2012 25  0 - 37 U/L Final  . ALT 06/05/2012 20  0 - 35 U/L Final  . Alkaline Phosphatase 06/05/2012 123* 39 - 117 U/L Final  . Total Bilirubin 06/05/2012 0.6  0.3 - 1.2 mg/dL Final  . GFR calc non Af Amer 06/05/2012 85* >90 mL/min Final  . GFR calc Af Amer 06/05/2012 >90  >90 mL/min Final   Comment:                                 The eGFR has been calculated                          using the CKD EPI equation.                          This calculation has not been                          validated in all clinical                          situations.                          eGFR's persistently                          <90 mL/min signify                          possible Chronic Kidney Disease.  Marland Kitchen Prothrombin Time 06/05/2012 13.8  11.6 - 15.2 seconds Final  . INR 06/05/2012 1.07  0.00 - 1.49 Final  . Color, Urine 06/05/2012 YELLOW  YELLOW Final  . APPearance 06/05/2012 CLEAR  CLEAR Final  . Specific Gravity, Urine 06/05/2012 1.009  1.005 - 1.030 Final  . pH 06/05/2012 5.5  5.0 - 8.0 Final  . Glucose, UA 06/05/2012 NEGATIVE  NEGATIVE mg/dL Final  . Hgb urine dipstick 06/05/2012 TRACE* NEGATIVE Final  . Bilirubin Urine 06/05/2012 NEGATIVE  NEGATIVE Final  . Ketones, ur 06/05/2012 NEGATIVE  NEGATIVE mg/dL Final  . Protein, ur 06/05/2012 NEGATIVE  NEGATIVE mg/dL Final  . Urobilinogen, UA 06/05/2012 0.2  0.0 - 1.0 mg/dL Final  . Nitrite 06/05/2012 NEGATIVE  NEGATIVE Final  . Leukocytes, UA 06/05/2012 NEGATIVE   NEGATIVE Final  . RBC / HPF 06/05/2012 3-6  <3 RBC/hpf Final  . Bacteria, UA 06/05/2012 FEW* RARE Final  Office Visit on 05/27/2012  Component Date Value Range Status  . Interferon Gamma Release Assay 05/27/2012 NEGATIVE   Final   Comment:                                               Normal Reference Range: Negative  The performance of the FDA approved QuantiFERON(R)-TB Gold test has                           not been extensively evaluated with specimens from the following                           groups of individuals:                           A. Individuals younger than 70 years of age.                           B. Pregnant women.                           C. Individuals who have impaired or altered immune function such as                              those who have HIV infection or AIDS, those who have                              transplantation managed with immunosuppressive drugs (e.g. managed                              with immunosuppressive drugs (e.g. corticosteroids,methotrexate,                              azathioprine, cancer chemotherapy), and those who have other                              clinical conditions: diabetes, silicosis, chronic renal failure,                              hematological disorders (e.g., leukemia and lymphomas), and other                              specific malignancies (e.g., carcinoma of the head and neck or                              lung).  . Angiotensin-Converting Enzyme 05/27/2012 48  8 - 52 U/L Final  Hospital Outpatient Visit on 05/14/2012  Component Date Value Range Status  . aPTT 05/14/2012 29  24 - 37 seconds Final  . WBC 05/14/2012 4.2  4.0 - 10.5 K/uL Final  . RBC 05/14/2012 4.78  3.87 - 5.11 MIL/uL Final  . Hemoglobin 05/14/2012 14.0  12.0 - 15.0 g/dL Final  . HCT 05/14/2012 43.1  36.0 - 46.0 % Final  . MCV 05/14/2012 90.2  78.0 - 100.0 fL Final  . MCH 05/14/2012 29.3   26.0 - 34.0 pg Final  . MCHC 05/14/2012 32.5  30.0 - 36.0 g/dL Final  . RDW 05/14/2012 13.5  11.5 - 15.5 % Final  . Platelets 05/14/2012 221  150 - 400 K/uL Final  . Prothrombin  Time 05/14/2012 13.1  11.6 - 15.2 seconds Final  . INR 05/14/2012 1.00  0.00 - 1.49 Final  Hospital Outpatient Visit on 05/08/2012  Component Date Value Range Status  . Glucose-Capillary 05/08/2012 97  70 - 99 mg/dL Final     X-Rays:Dg Chest 2 View  06/05/2012  *RADIOLOGY REPORT*  Clinical Data: Preop for right total knee replacement.  No chest complaints.  History of coronary stent placement.  CHEST - 2 VIEW  Comparison: 05/14/2012  Findings: A moderate pectus excavatum deformity.  Moderate thoracic spondylosis. Midline trachea.  Mild cardiomegaly.  No pleural effusion or pneumothorax.  No congestive failure.  Left lower lobe pulmonary lesion is similar and was biopsied on 05/14/2012.  IMPRESSION:  1. Cardiomegaly without congestive failure. 2. No acute cardiopulmonary disease. 3.  Similar left lower lobe lung mass.  This was biopsied on 05/14/2012, returning benign findings.   Original Report Authenticated By: Abigail Miyamoto, M.D.     EKG: Orders placed during the hospital encounter of 04/13/12  . EKG 12-LEAD  . EKG 12-LEAD  . EKG 12-LEAD  . EKG 12-LEAD  . EKG     Hospital Course: Isabella [name] is a 72 y.o. who was admitted to [facility]. They were brought to the operating room on 06/17/2012 and underwent Procedure(s): TOTAL KNEE ARTHROPLASTY.  Patient tolerated the procedure well and was later transferred to the recovery room and then to the orthopaedic floor for postoperative care.  They were given PO and IV analgesics for pain control following their surgery.  They were given 24 hours of postoperative antibiotics of  Anti-infectives     Start     Dose/Rate Route Frequency Ordered Stop   06/18/12 0600   ceFAZolin (ANCEF) IVPB 2 g/50 mL premix  Status:  Discontinued        2 g 100 mL/hr over 30 Minutes  Intravenous On call to O.R. 06/17/12 1317 06/17/12 1426   06/18/12 0400   vancomycin (VANCOCIN) IVPB 1000 mg/200 mL premix        1,000 mg 200 mL/hr over 60 Minutes Intravenous Every 12 hours 06/17/12 1855 06/18/12 0456   06/17/12 1430   vancomycin (VANCOCIN) IVPB 1000 mg/200 mL premix        1,000 mg 200 mL/hr over 60 Minutes Intravenous  Once 06/17/12 1425 06/17/12 1556         and started on DVT prophylaxis in the form of Xarelto.   PT and OT were ordered for total joint protocol.  Discharge planning consulted to help with postop disposition and equipment needs.  Patient had a decent night on the evening of surgery and started to get up OOB with therapy on day one walking about 20 and 40 feet. Hemovac drain was pulled without difficulty.  Continued to work with therapy into day two walking about 100 feet.  Dressing was changed on day two and the incision was healing well. Patient was seen in rounds and was ready to go home later that evening after therapy.   Discharge Medications: Prior to Admission medications   Medication Sig Start Date End Date Taking? Authorizing Provider  atorvastatin (LIPITOR) 40 MG tablet Take 40 mg by mouth at bedtime.   Yes Historical Provider, MD  chlorpheniramine (CHLOR-TRIMETON) 4 MG tablet Take 4 mg by mouth at bedtime.    Yes Historical Provider, MD  furosemide (LASIX) 40 MG tablet Take 40 mg by mouth daily before breakfast.    Yes Historical Provider, MD  metoprolol tartrate (LOPRESSOR)  25 MG tablet Take 25 mg by mouth 2 (two) times daily.   Yes Historical Provider, MD  olmesartan (BENICAR) 40 MG tablet Take 40 mg by mouth daily before breakfast.    Yes Historical Provider, MD  HYDROmorphone (DILAUDID) 2 MG tablet Take 1-2 tablets (2-4 mg total) by mouth every 4 (four) hours as needed. 06/19/12   Rockell Faulks Dara Lords, PA  methocarbamol (ROBAXIN) 500 MG tablet Take 1 tablet (500 mg total) by mouth every 6 (six) hours as needed. 06/19/12   Narely Nobles Dara Lords, PA    nitroGLYCERIN (NITROSTAT) 0.4 MG SL tablet Place 0.4 mg under the tongue as needed. For chest pain    Historical Provider, MD  rivaroxaban (XARELTO) 10 MG TABS tablet Take 1 tablet (10 mg total) by mouth daily with breakfast. Take Xarelto for two and a half more weeks, then discontinue Xarelto. Once the patient has completed the Xarelto, they may resume the 81 mg Aspirin. 06/19/12   Kameo Bains Dara Lords, PA    Diet: Cardiac diet Activity:WBAT Follow-up:in 2 weeks Disposition - Home Discharged Condition: good   Discharge Orders    Future Appointments: Provider: Department: Dept Phone: Center:   07/10/2012 12:00 PM [facility]  IMAGING [phone] [facility]     Future Orders Please Complete By Expires   Diet - low sodium heart healthy      Call MD / Call 911      Comments:   If you experience chest pain or shortness of breath, CALL 911 and be transported to the hospital emergency room.  If you develope a fever above 101 F, pus (white drainage) or increased drainage or redness at the wound, or calf pain, call your surgeon's office.   Discharge instructions      Comments:   Pick up stool softner and laxative for home. Do not submerge incision under water. May shower. Continue to use ice for pain and swelling from surgery.  Take Xarelto for two and a half more weeks, then discontinue Xarelto. Once the patient has completed the Xarelto, they may resume the 81 mg Aspirin.   Constipation Prevention      Comments:   Drink plenty of fluids.  Prune juice may be helpful.  You may use a stool softener, such as Colace (over the counter) 100 mg twice a day.  Use MiraLax (over the counter) for constipation as needed.   Increase activity slowly as tolerated      Patient may shower      Comments:   You may shower without a dressing once there is no drainage.  Do not wash over the wound.  If drainage remains, do not shower until drainage stops.   Weight bearing as  tolerated      Driving restrictions      Comments:   No driving until released by the physician.   Lifting restrictions      Comments:   No lifting until released by the physician.   TED hose      Comments:   Use stockings (TED hose) for 3 weeks on both leg(s).  You may remove them at night for sleeping.   Change dressing      Comments:   Change dressing daily with sterile 4 x 4 inch gauze dressing and apply TED hose. Do not submerge the incision under water.   Do not put a pillow under the knee. Place it under the heel.      Do not sit on low  chairs, stoools or toilet seats, as it may be difficult to get up from low surfaces          Medication List     As of 06/19/2012  9:06 AM    STOP taking these medications         aspirin 81 MG tablet      ibuprofen 200 MG tablet   Commonly known as: ADVIL,MOTRIN      TAKE these medications         CHLOR-TRIMETON 4 MG tablet   Generic drug: chlorpheniramine   Take 4 mg by mouth at bedtime.      furosemide 40 MG tablet   Commonly known as: LASIX   Take 40 mg by mouth daily before breakfast.      HYDROmorphone 2 MG tablet   Commonly known as: DILAUDID   Take 1-2 tablets (2-4 mg total) by mouth every 4 (four) hours as needed.      LIPITOR 40 MG tablet   Generic drug: atorvastatin   Take 40 mg by mouth at bedtime.      methocarbamol 500 MG tablet   Commonly known as: ROBAXIN   Take 1 tablet (500 mg total) by mouth every 6 (six) hours as needed.      metoprolol tartrate 25 MG tablet   Commonly known as: LOPRESSOR   Take 25 mg by mouth 2 (two) times daily.      nitroGLYCERIN 0.4 MG SL tablet   Commonly known as: NITROSTAT   Place 0.4 mg under the tongue as needed. For chest pain      olmesartan 40 MG tablet   Commonly known as: BENICAR   Take 40 mg by mouth daily before breakfast.      rivaroxaban 10 MG Tabs tablet   Commonly known as: XARELTO   Take 1 tablet (10 mg total) by mouth daily with breakfast. Take Xarelto for  two and a half more weeks, then discontinue Xarelto.  Once the patient has completed the Xarelto, they may resume the 81 mg Aspirin.           Follow-up Information    Follow up with Gearlean Alf, MD. Schedule an appointment as soon as possible for a visit in 2 weeks.   Contact information:   [address] [place] [address] [phone]          Signed: Mickel Crow 06/19/2012, 9:06 AM

## 2012-06-19 NOTE — Progress Notes (Signed)
Physical Therapy Treatment Patient Details Name: Isabella [name] MRN: [id number] DOB: Nov 25, 1940 Today's Date: 06/19/2012 Time: 0920-0948 PT Time Calculation (min): 28 min  PT Assessment / Plan / Recommendation Comments on Treatment Session  Progressing well. Tolerated increased activity. Plan is for d/c home today, later this pm after 2nd session. Recommend HHPT.     Follow Up Recommendations  Home health PT     Does the patient have the potential to tolerate intense rehabilitation     Barriers to Discharge        Equipment Recommendations  None recommended by PT    Recommendations for Other Services OT consult  Frequency 7X/week   Plan Discharge plan remains appropriate    Precautions / Restrictions Precautions Precautions: Knee Required Braces or Orthoses: Knee Immobilizer - Right (Ki DC'd 06/19/12-able to SLR) Knee Immobilizer - Right: Discontinue once straight leg raise with < 10 degree lag Restrictions Weight Bearing Restrictions: No RLE Weight Bearing: Weight bearing as tolerated   Pertinent Vitals/Pain 3/10 R knee    Mobility  Transfers Transfers: Stand to Sit;Sit to Stand Sit to Stand: 4: Min guard;From chair/3-in-1;With armrests Stand to Sit: To chair/3-in-1;4: Min guard;With armrests Details for Transfer Assistance: VCs safety, technique, hand placement Ambulation/Gait Ambulation/Gait Assistance: 4: Min guard Ambulation Distance (Feet): 100 Feet Assistive device: Rolling walker Ambulation/Gait Assistance Details: VCsafety, sequence. Fatigues fairly easily.  Gait Pattern: Step-to pattern;Antalgic;Decreased stride length;Decreased step length - right    Exercises Total Joint Exercises Ankle Circles/Pumps: AROM;Both;10 reps;Seated Quad Sets: AROM;Both;10 reps;Seated Short Arc Quad: AROM;Right;10 reps;Seated Hip ABduction/ADduction: AROM;Right;10 reps;Seated Straight Leg Raises: AROM;Right;10 reps;Seated Knee Flexion: AROM;AAROM;10 reps;Right;Seated Goniometric  ROM: 10-60 degrees   PT Diagnosis:    PT Problem List:   PT Treatment Interventions:     PT Goals Acute Rehab PT Goals Pt will go Sit to Stand: with supervision PT Goal: Sit to Stand - Progress: Progressing toward goal Pt will Ambulate: 51 - 150 feet;with supervision;with rolling walker PT Goal: Ambulate - Progress: Progressing toward goal Pt will Perform Home Exercise Program: with supervision, verbal cues required/provided PT Goal: Perform Home Exercise Program - Progress: Progressing toward goal  Visit Information  Last PT Received On: 06/19/12 Assistance Needed: +1    Subjective Data  Subjective: "I still have that burning feeling" Patient Stated Goal: home today   Cognition  Cognition Overall Cognitive Status: Appears within functional limits for tasks assessed/performed Arousal/Alertness: Awake/alert Orientation Level: Appears intact for tasks assessed Behavior During Session: [facility] for tasks performed    Balance     End of Session PT - End of Session Equipment Utilized During Treatment: Gait belt Activity Tolerance: Patient tolerated treatment well Patient left: in chair;with call bell/phone within reach CPM Right Knee CPM Right Knee: Off   GP     Weston Anna [facility] 06/19/2012, 9:57 AM [phone]

## 2012-06-19 NOTE — Progress Notes (Signed)
   Subjective: 2 Days Post-Op Procedure(s) (LRB): TOTAL KNEE ARTHROPLASTY (Right) Patient reports pain as mild.   Patient seen in rounds with Dr. Wynelle Link. Patient is well, and has had no acute complaints or problems Patient is ready to go home later today after therapy.  Objective: Vital signs in last 24 hours: Temp:  [97.8 F (36.6 C)-98.4 F (36.9 C)] 98.1 F (36.7 C) (02/07 0440) Pulse Rate:  [57-64] 57  (02/07 0440) Resp:  [20] 20  (02/07 0440) BP: (115-162)/(72-80) 162/80 mmHg (02/07 0440) SpO2:  [94 %-98 %] 96 % (02/07 0440)  Intake/Output from previous day:  Intake/Output Summary (Last 24 hours) at 06/19/12 0904 Last data filed at 06/19/12 0552  Gross per 24 hour  Intake    960 ml  Output   2625 ml  Net  -1665 ml    Intake/Output this shift:    Labs:  Basename 06/19/12 0410 06/18/12 0356  HGB 11.7* 12.2    Basename 06/19/12 0410 06/18/12 0356  WBC 14.8* 9.2  RBC 3.99 4.16  HCT 35.6* 37.1  PLT 193 184    Basename 06/19/12 0410 06/18/12 0356  NA 138 137  K 3.8 3.8  CL 105 102  CO2 27 27  BUN 11 11  CREATININE 0.58 0.56  GLUCOSE 145* 146*  CALCIUM 8.5 8.1*   No results found for this basename: LABPT:2,INR:2 in the last 72 hours  EXAM: General - Patient is Alert, Appropriate and Oriented Extremity - Neurovascular intact Sensation intact distally Dorsiflexion/Plantar flexion intact Incision - clean, dry, no drainage, healing Motor Function - intact, moving foot and toes well on exam.   Assessment/Plan: 2 Days Post-Op Procedure(s) (LRB): TOTAL KNEE ARTHROPLASTY (Right) Procedure(s) (LRB): TOTAL KNEE ARTHROPLASTY (Right) Past Medical History  Diagnosis Date  . Hypertension   . High cholesterol   . Coronary artery disease   . Myocardial infarct 03/24/2011    Dr. [name]  . Sleep apnea     uses cpap  . Complication of anesthesia     Took a while to wake knee  . History of blood transfusion   . Lung nodule   . Arthritis    Principal  Problem:  *OA (osteoarthritis) of knee  Estimated Body mass index is 38.04 kg/(m^2) as calculated from the following:   Height as of this encounter: 5' 2" (1.575 m).   Weight as of this encounter: 208 lb(94.348 kg). Up with therapy Discharge home with home health Diet - Cardiac diet Follow up - in 2 weeks Activity - WBAT Disposition - Home Condition Upon Discharge - Good D/C Meds - See DC Summary DVT Prophylaxis - Xarelto, ASA 81 mg on hold   Amro Winebarger 06/19/2012, 9:04 AM

## 2012-06-19 NOTE — Progress Notes (Signed)
Physical Therapy Treatment Patient Details Name: Isabella [name] MRN: [id number] DOB: [birth year] Today's Date: 06/19/2012 Time: 4975-3005 PT Time Calculation (min): 18 min  PT Assessment / Plan / Recommendation Comments on Treatment Session  continuing to progress well. 2nd session performed stair negotiation and ambulation. Issued ROM exercise handout. Instructed pt to perform exercises one more time this evening at home. Recommend HHPT. Ready for d/c home.     Follow Up Recommendations  Home health PT     Does the patient have the potential to tolerate intense rehabilitation     Barriers to Discharge        Equipment Recommendations  None recommended by PT    Recommendations for Other Services OT consult  Frequency 7X/week   Plan Discharge plan remains appropriate    Precautions / Restrictions Precautions Precautions: Knee Required Braces or Orthoses: Knee Immobilizer - Right (Ki DC'd 06/19/12-able to SLR) Knee Immobilizer - Right: Discontinue once straight leg raise with < 10 degree lag Restrictions Weight Bearing Restrictions: No RLE Weight Bearing: Weight bearing as tolerated   Pertinent Vitals/Pain 6/10 R knee    Mobility  Transfers Transfers: Sit to Stand;Stand to Sit Sit to Stand: 4: Min guard;From chair/3-in-1;With armrests Stand to Sit: 4: Min guard;To chair/3-in-1;With armrests Details for Transfer Assistance: VCS safety, hand placement Ambulation/Gait Ambulation/Gait Assistance: 4: Min guard Ambulation Distance (Feet): 100 Feet Assistive device: Rolling walker Ambulation/Gait Assistance Details: VCs safety. Fatigues fairly easily.  Gait Pattern: Step-to pattern Stairs: Yes Stairs Assistance: 4: Min guard Stairs Assistance Details (indicate cue type and reason): VCS safety, technique, sequence. Family present and observed pt. Stair Management Technique: Forwards;With walker Number of Stairs: 1     Exercises    PT Diagnosis:    PT Problem List:   PT  Treatment Interventions:     PT Goals Acute Rehab PT Goals Pt will go Sit to Stand: with supervision PT Goal: Sit to Stand - Progress: Progressing toward goal Pt will Ambulate: 51 - 150 feet;with supervision;with rolling walker PT Goal: Ambulate - Progress: Progressing toward goal Pt will Go Up / Down Stairs: 1-2 stairs;with supervision;with rolling walker PT Goal: Up/Down Stairs - Progress: Progressing toward goal Pt will Perform Home Exercise Program: with supervision, verbal cues required/provided PT Goal: Perform Home Exercise Program - Progress: Progressing toward goal  Visit Information  Last PT Received On: 06/19/12 Assistance Needed: +1    Subjective Data  Subjective: "Im sore now" Patient Stated Goal: home today   Cognition  Cognition Overall Cognitive Status: Appears within functional limits for tasks assessed/performed Arousal/Alertness: Awake/alert Orientation Level: Appears intact for tasks assessed Behavior During Session: [facility] for tasks performed    Balance     End of Session PT - End of Session Equipment Utilized During Treatment: Gait belt Activity Tolerance: Patient tolerated treatment well Patient left: in chair;with call bell/phone within reach   GP     Isabella [name] 06/19/2012, 1:52 PM [phone]

## 2012-06-22 ENCOUNTER — Encounter (HOSPITAL_COMMUNITY): Payer: Medicare Other

## 2012-06-23 ENCOUNTER — Encounter: Payer: Medicare Other | Admitting: Thoracic Surgery (Cardiothoracic Vascular Surgery)

## 2012-06-24 ENCOUNTER — Encounter (HOSPITAL_COMMUNITY): Payer: Medicare Other

## 2012-06-26 ENCOUNTER — Encounter (HOSPITAL_COMMUNITY): Payer: Medicare Other

## 2012-06-27 ENCOUNTER — Other Ambulatory Visit: Payer: Self-pay

## 2012-06-29 ENCOUNTER — Encounter (HOSPITAL_COMMUNITY): Payer: Medicare Other

## 2012-07-01 ENCOUNTER — Encounter (HOSPITAL_COMMUNITY): Payer: Medicare Other

## 2012-07-03 ENCOUNTER — Encounter (HOSPITAL_COMMUNITY): Payer: Medicare Other

## 2012-07-06 ENCOUNTER — Encounter (HOSPITAL_COMMUNITY): Payer: Medicare Other

## 2012-07-08 ENCOUNTER — Encounter (HOSPITAL_COMMUNITY): Payer: Medicare Other

## 2012-07-10 ENCOUNTER — Encounter (HOSPITAL_COMMUNITY): Payer: Medicare Other

## 2012-07-10 ENCOUNTER — Ambulatory Visit
Admission: RE | Admit: 2012-07-10 | Discharge: 2012-07-10 | Disposition: A | Discharge status: Home / Self Care | Payer: Medicare Other | Source: Ambulatory Visit | Attending: Family Medicine | Admitting: Family Medicine

## 2012-07-13 ENCOUNTER — Encounter (HOSPITAL_COMMUNITY): Payer: Medicare Other

## 2012-07-15 ENCOUNTER — Encounter (HOSPITAL_COMMUNITY): Payer: Medicare Other

## 2012-07-17 ENCOUNTER — Encounter (HOSPITAL_COMMUNITY): Payer: Medicare Other

## 2012-07-20 ENCOUNTER — Encounter (HOSPITAL_COMMUNITY): Payer: Medicare Other

## 2012-07-22 ENCOUNTER — Encounter (HOSPITAL_COMMUNITY): Payer: Medicare Other

## 2012-07-24 ENCOUNTER — Encounter (HOSPITAL_COMMUNITY): Payer: Medicare Other

## 2012-07-27 ENCOUNTER — Encounter (HOSPITAL_COMMUNITY): Payer: Medicare Other

## 2012-07-29 ENCOUNTER — Encounter (HOSPITAL_COMMUNITY): Payer: Medicare Other

## 2012-07-31 ENCOUNTER — Encounter (HOSPITAL_COMMUNITY): Payer: Medicare Other

## 2012-07-31 ENCOUNTER — Other Ambulatory Visit: Payer: Self-pay | Admitting: *Deleted

## 2012-07-31 DIAGNOSIS — J8410 Pulmonary fibrosis, unspecified: Secondary | ICD-10-CM

## 2012-08-03 ENCOUNTER — Encounter (HOSPITAL_COMMUNITY): Payer: Medicare Other

## 2012-08-04 ENCOUNTER — Other Ambulatory Visit: Payer: Self-pay | Admitting: *Deleted

## 2012-08-04 DIAGNOSIS — J8410 Pulmonary fibrosis, unspecified: Secondary | ICD-10-CM

## 2012-08-05 ENCOUNTER — Encounter (HOSPITAL_COMMUNITY): Payer: Medicare Other

## 2012-08-07 ENCOUNTER — Encounter (HOSPITAL_COMMUNITY): Payer: Medicare Other

## 2012-08-10 ENCOUNTER — Encounter (HOSPITAL_COMMUNITY): Payer: Medicare Other

## 2012-08-12 ENCOUNTER — Encounter (HOSPITAL_COMMUNITY): Payer: Medicare Other

## 2012-08-12 LAB — CREATININE, SERUM: Creat: 0.8 mg/dL (ref 0.50–1.10)

## 2012-08-12 LAB — BUN: BUN: 13 mg/dL (ref 6–23)

## 2012-08-14 ENCOUNTER — Encounter (HOSPITAL_COMMUNITY): Payer: Medicare Other

## 2012-08-17 ENCOUNTER — Encounter (HOSPITAL_COMMUNITY): Payer: Medicare Other

## 2012-08-18 ENCOUNTER — Ambulatory Visit: Payer: Medicare Other | Admitting: Thoracic Surgery (Cardiothoracic Vascular Surgery)

## 2012-08-18 ENCOUNTER — Other Ambulatory Visit: Payer: Medicare Other

## 2012-08-19 ENCOUNTER — Encounter (HOSPITAL_COMMUNITY): Payer: Medicare Other

## 2012-08-21 ENCOUNTER — Encounter (HOSPITAL_COMMUNITY): Payer: Medicare Other

## 2012-08-24 ENCOUNTER — Encounter (HOSPITAL_COMMUNITY): Payer: Medicare Other

## 2012-08-25 ENCOUNTER — Ambulatory Visit
Admission: RE | Admit: 2012-08-25 | Discharge: 2012-08-25 | Disposition: A | Discharge status: Home / Self Care | Payer: Medicare Other | Source: Ambulatory Visit | Attending: Thoracic Surgery (Cardiothoracic Vascular Surgery) | Admitting: Thoracic Surgery (Cardiothoracic Vascular Surgery)

## 2012-08-25 ENCOUNTER — Encounter: Payer: Self-pay | Admitting: Thoracic Surgery (Cardiothoracic Vascular Surgery)

## 2012-08-25 ENCOUNTER — Ambulatory Visit (INDEPENDENT_AMBULATORY_CARE_PROVIDER_SITE_OTHER): Payer: Medicare Other | Admitting: Thoracic Surgery (Cardiothoracic Vascular Surgery)

## 2012-08-25 ENCOUNTER — Ambulatory Visit: Payer: Medicare Other | Admitting: Thoracic Surgery (Cardiothoracic Vascular Surgery)

## 2012-08-25 VITALS — BP 141/83 | HR 67 | Resp 20 | Ht 62.0 in | Wt 208.0 lb

## 2012-08-25 DIAGNOSIS — R918 Other nonspecific abnormal finding of lung field: Secondary | ICD-10-CM

## 2012-08-25 DIAGNOSIS — J8410 Pulmonary fibrosis, unspecified: Secondary | ICD-10-CM

## 2012-08-25 MED ORDER — IOHEXOL 300 MG/ML  SOLN
75.0000 mL | Freq: Once | INTRAMUSCULAR | Status: AC | PRN
  Administered 2012-08-25: 75 mL via INTRAVENOUS

## 2012-08-25 NOTE — Progress Notes (Signed)
HPI: [name] returns for a scheduled followup visit. She was being evaluated for knee replacement with a chest x-ray showed a left lung mass. This was associated with multiple other small lung nodules. We did a bronchoscopic biopsy which showed reactive changes but no malignancy. He felt that with a needle biopsy of the lung which showed nonnecrotizing granulomas. She had had her knee surgery and that went well. She now returns for her scheduled 3 month followup visit and CT scan.  She says she's been doing well. She's not having difficulty with her breathing. She denies shortness of breath or wheezing. She denies hemoptysis. She does have a cough which is stable. Her weight has been stable.  Past Medical History  Diagnosis Date  . Hypertension   . High cholesterol   . Coronary artery disease   . Myocardial infarct 03/24/2011    Dr. [name]  . Sleep apnea     uses cpap  . Complication of anesthesia     Took a while to wake knee  . History of blood transfusion   . Lung nodule   . Arthritis       Current Outpatient Prescriptions  Medication Sig Dispense Refill  . aspirin 81 MG tablet Take 81 mg by mouth daily.      Marland Kitchen atorvastatin (LIPITOR) 40 MG tablet Take 40 mg by mouth at bedtime.      . furosemide (LASIX) 40 MG tablet Take 40 mg by mouth daily before breakfast.       . metoprolol tartrate (LOPRESSOR) 25 MG tablet Take 25 mg by mouth 2 (two) times daily.      . nitroGLYCERIN (NITROSTAT) 0.4 MG SL tablet Place 0.4 mg under the tongue as needed. For chest pain      . olmesartan (BENICAR) 40 MG tablet Take 40 mg by mouth daily before breakfast.        No current facility-administered medications for this visit.    Physical Exam BP 141/83  Pulse 67  Resp 20  Ht 5' 2"  (1.575 m)  Wt 208 lb (94.348 kg)  BMI 38.03 kg/m2  SpO2 73% Obese 72 year old woman in no acute distress Neurologic alert and oriented x3 with no deficits No cervical or supraclavicular adenopathy Lungs clear  with equal breath sounds bilaterally Cardiac regular rate and rhythm normal S1 and S2  Diagnostic Tests: CT of chest 08/25/2012 *RADIOLOGY REPORT*  Clinical Data: Lung disease, follow-up.  CT CHEST WITH CONTRAST  Technique: Multidetector CT imaging of the chest was performed  following the standard protocol during bolus administration of  intravenous contrast.  Contrast: 32m OMNIPAQUE IOHEXOL 300 MG/ML SOLN  Comparison: CT 03/26/2012. PET CT 05/08/2012.  Findings: The mass in the left lower lobe has changed in  configuration and is more plate-like currently. This measures  approximately 2.4 x 1.3 cm. When measured at the same level and in  the same planes on prior study, this measured approximately 3.4 x  1.8 cm.  11 mm nodule in the right lower lobe on image 41 is stable.  Numerous scattered small bilateral nodules are stable. No new or  enlarging nodules.  Heart is normal size. Aorta is normal caliber. No mediastinal,  hilar, or axillary adenopathy. Visualized thyroid and chest wall  soft tissues unremarkable.  Multiple low-density lesions in the liver and left kidney  compatible with cysts. No acute findings in the upper abdomen.  Degenerative changes in the lower thoracic spine and upper lumbar  spine.  IMPRESSION:  Left  lower lobe mass appears less mass-like and more plate-like on  today's study. Overall mass appears smaller in size.  Other scattered nodules in both lungs are stable.  Original Report Authenticated By: Rolm Baptise, M  Impression: 72 year old woman with multiple lung nodules including a rather large mass in the left lower lobe. In the interim since her last film this lesion has gotten smaller without any specific therapy. All the other smaller nodules remained stable. There are no nodules that have increased in size and become suspicious in the interim. Her needle biopsy did show some nonnecrotizing granulomas. She's a little old for the onset of sarcoid but is  not completely out of the question. It also could be related to a atypical mycobacterial infection, but cultures were negative. In any event the primary lesion has gotten smaller and the others remain stable and no further intervention is warranted at this time. I do think we need to continue to follow these other nodules out to 2 years to make sure they remain stable.  Plan: Return in 6 months with CT of chest

## 2012-08-26 ENCOUNTER — Encounter (HOSPITAL_COMMUNITY): Payer: Medicare Other

## 2012-08-28 ENCOUNTER — Encounter (HOSPITAL_COMMUNITY): Payer: Medicare Other

## 2012-08-31 ENCOUNTER — Encounter (HOSPITAL_COMMUNITY): Payer: Medicare Other

## 2012-09-02 ENCOUNTER — Encounter (HOSPITAL_COMMUNITY): Payer: Medicare Other

## 2012-09-04 ENCOUNTER — Encounter (HOSPITAL_COMMUNITY): Payer: Medicare Other

## 2012-09-07 ENCOUNTER — Encounter (HOSPITAL_COMMUNITY): Payer: Medicare Other

## 2012-09-09 ENCOUNTER — Encounter (HOSPITAL_COMMUNITY): Payer: Medicare Other

## 2012-09-11 ENCOUNTER — Encounter (HOSPITAL_COMMUNITY): Payer: Medicare Other

## 2012-09-14 ENCOUNTER — Encounter (HOSPITAL_COMMUNITY): Payer: Medicare Other

## 2012-09-16 ENCOUNTER — Encounter (HOSPITAL_COMMUNITY): Payer: Medicare Other

## 2012-09-18 ENCOUNTER — Encounter (HOSPITAL_COMMUNITY): Payer: Medicare Other

## 2012-09-21 ENCOUNTER — Encounter (HOSPITAL_COMMUNITY): Payer: Medicare Other

## 2012-09-23 ENCOUNTER — Encounter (HOSPITAL_COMMUNITY): Payer: Medicare Other

## 2012-09-25 ENCOUNTER — Encounter (HOSPITAL_COMMUNITY): Payer: Medicare Other

## 2012-09-28 ENCOUNTER — Encounter (HOSPITAL_COMMUNITY): Payer: Medicare Other

## 2012-09-30 ENCOUNTER — Encounter (HOSPITAL_COMMUNITY): Payer: Medicare Other

## 2012-10-02 ENCOUNTER — Encounter (HOSPITAL_COMMUNITY): Payer: Medicare Other

## 2012-10-05 ENCOUNTER — Encounter (HOSPITAL_COMMUNITY): Payer: Medicare Other

## 2012-10-07 ENCOUNTER — Encounter (HOSPITAL_COMMUNITY): Payer: Medicare Other

## 2012-10-09 ENCOUNTER — Encounter (HOSPITAL_COMMUNITY): Payer: Medicare Other

## 2012-12-16 ENCOUNTER — Other Ambulatory Visit: Payer: Self-pay

## 2013-02-10 ENCOUNTER — Other Ambulatory Visit: Payer: Self-pay

## 2013-02-10 DIAGNOSIS — D381 Neoplasm of uncertain behavior of trachea, bronchus and lung: Secondary | ICD-10-CM

## 2013-03-16 ENCOUNTER — Encounter: Payer: Self-pay | Admitting: Thoracic Surgery (Cardiothoracic Vascular Surgery)

## 2013-03-16 ENCOUNTER — Ambulatory Visit (INDEPENDENT_AMBULATORY_CARE_PROVIDER_SITE_OTHER): Payer: Medicare Other | Admitting: Thoracic Surgery (Cardiothoracic Vascular Surgery)

## 2013-03-16 ENCOUNTER — Ambulatory Visit
Admission: RE | Admit: 2013-03-16 | Discharge: 2013-03-16 | Disposition: A | Discharge status: Home / Self Care | Payer: Medicare Other | Source: Ambulatory Visit | Attending: Thoracic Surgery (Cardiothoracic Vascular Surgery) | Admitting: Thoracic Surgery (Cardiothoracic Vascular Surgery)

## 2013-03-16 VITALS — BP 156/80 | HR 61 | Resp 16 | Ht 62.0 in | Wt 220.0 lb

## 2013-03-16 DIAGNOSIS — R918 Other nonspecific abnormal finding of lung field: Secondary | ICD-10-CM

## 2013-03-16 DIAGNOSIS — D381 Neoplasm of uncertain behavior of trachea, bronchus and lung: Secondary | ICD-10-CM

## 2013-03-16 DIAGNOSIS — J984 Other disorders of lung: Secondary | ICD-10-CM

## 2013-03-16 DIAGNOSIS — J8410 Pulmonary fibrosis, unspecified: Secondary | ICD-10-CM

## 2013-03-16 NOTE — Progress Notes (Signed)
HPI:  Isabella [name] returns today her scheduled 6 month followup visit.  She was first found to have multiple lung nodules in November of 2013 on a chest x-ray done prior to a knee operation. CT of the chest was done which showed multiple bilateral lung nodules largest of which was very irregular and was in the left lower lobe. She had a PET/CT which showed hypermetabolism in the left lower lobe nodule. There was no hilar or mediastinal activity. All of the other nodules were negative although most of them were below the threshold on PET.  A CT-guided needle biopsy of the left lower lobe mass was done and showed noncaseating granulomas. She went ahead with her knee surgery and on followup scan in April the majority of the nodules were unchanged in the left lower lobe one actually was smaller than it had been. We decided to just follow this up in 6 months rather than initiate treatment.  She says that since her last visit she's been doing well. She has gained some weight. She has an occasional cough which she thinks is due to to postnasal drip. She's not had any fevers, chills or sweats. She denies any chest pain or shortness of breath.  Past Medical History  Diagnosis Date  . Hypertension   . High cholesterol   . Coronary artery disease   . Myocardial infarct 03/24/2011    Dr. [name]  . Sleep apnea     uses cpap  . Complication of anesthesia     Took a while to wake knee  . History of blood transfusion   . Lung nodule   . Arthritis      Current Outpatient Prescriptions  Medication Sig Dispense Refill  . aspirin 81 MG tablet Take 81 mg by mouth daily.      Marland Kitchen atorvastatin (LIPITOR) 40 MG tablet Take 40 mg by mouth at bedtime.      . chlorpheniramine (CHLOR-TRIMETON) 4 MG tablet Take 8 mg by mouth at bedtime.      . furosemide (LASIX) 40 MG tablet Take 40 mg by mouth daily before breakfast.       . metoprolol tartrate (LOPRESSOR) 25 MG tablet Take 25 mg by mouth 2 (two) times daily.       . nitroGLYCERIN (NITROSTAT) 0.4 MG SL tablet Place 0.4 mg under the tongue as needed. For chest pain      . olmesartan (BENICAR) 40 MG tablet Take 40 mg by mouth daily before breakfast.        No current facility-administered medications for this visit.    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  COMPARISON: Prior chest CT 08/25/2012  FINDINGS:  Mediastinum: 1.8 cm low-attenuation right thyroid nodule has  slightly enlarged in size compared to prior PET-CT from 05/08/2012.  Suspicious mediastinal or hilar adenopathy. Unremarkable thoracic  esophagus.  Heart/Vascular: Metallic coronary stent in the proximal right  coronary artery. Endocardial fibro fatty remodeling of  the apical  anteroseptal consistent with the sequelae of prior myocardial  infarction. The heart is within normal limits for size. No  pericardial effusion.  Lungs/Pleura: No pleural effusion. Re- demonstration of the numerous  bilateral scattered pulmonary nodules ranging in size from 1-2 mm to  a maximum of 12 mm in the medial right lower lobe and 2.4 x 2.9 cm  in the left lower lobe. The right lower lobe nodule is stable and  previously measured 11 mm. The left lower lobe pulmonary nodule has  fluctuated in size over time and currently has a larger, more  mass-like appearance than on the most recent prior CT scan.  Otherwise there has been no significant interval change.  No new  pulmonary nodules.  Bones/Soft Tissues: No acute fracture or aggressive appearing lytic  or blastic osseous lesion.  Upper Abdomen: Limited imaging of the upper abdomen demonstrates  multiple circumscribed hypo attenuating hepatic lesions which are  incompletely characterized in the absence of intravenous contrast  material but were previously characterized as benign hepatic cysts.  Fatty atrophy of the pancreas is noted incidentally. No acute  abnormality detected.  IMPRESSION:  1. Redemonstration of numerous bilateral scattered pulmonary  nodules, the majority of which remain stable. The dominant nodule  which was previously biopsied and identified to be a non necrotizing  epithelioid granuloma continues to fluctuate in size and is  currently larger, and more masslike than seen on the most recent  prior CT scan from 08/25/2012.  2. Atherosclerosis including coronary artery disease.  3. Slight interval enlargement in a low-attenuation right thyroid  nodule which remains a non specific by CT scan. Dedicated thyroid  ultrasound could further evaluate if clinically warranted.  4. Additional ancillary findings as above.  Electronically Signed  By: Jacqulynn Cadet M.D.  On: 03/16/2013 13:18  Impression: 72 year old woman with multiple lung nodules. She a biopsy for several months ago which showed noncaseating granulomas. A followup CT it showed and the largest of the nodules was actually smaller, so we did not initiate any treatment at that time. She now returns for 6 month followup in the left lower lobe nodule/mass has increased in size since her last scan.  I discussed her options with Isabella [name]. One option would be to rebiopsy the lesion, however there is really no reason to suspect that the results of that would be any different than her previous biopsy. I do think that some form of intervention is needed, and given that she had noncaseating granulomas with be in favor of a  trial of prednisone with a followup CT scan in about 3 months.  She has seen Dr. Gwenette Greet in the past for sleep apnea. I would like to get his opinion on this matter prior to initiating any therapy. We will arrange appointment with him.  Plan: Consult with Dr. Danton Sewer  I will plan to see her back in 3 months with a repeat CT of the chest.

## 2013-03-18 ENCOUNTER — Other Ambulatory Visit: Payer: Self-pay

## 2013-03-29 ENCOUNTER — Encounter: Payer: Self-pay | Admitting: Interventional Cardiology

## 2013-03-29 ENCOUNTER — Ambulatory Visit (INDEPENDENT_AMBULATORY_CARE_PROVIDER_SITE_OTHER): Payer: Medicare Other | Admitting: Interventional Cardiology

## 2013-03-29 VITALS — BP 158/90 | HR 61 | Ht 62.0 in | Wt 226.0 lb

## 2013-03-29 DIAGNOSIS — I10 Essential (primary) hypertension: Secondary | ICD-10-CM

## 2013-03-29 DIAGNOSIS — I252 Old myocardial infarction: Secondary | ICD-10-CM

## 2013-03-29 DIAGNOSIS — E785 Hyperlipidemia, unspecified: Secondary | ICD-10-CM

## 2013-03-29 DIAGNOSIS — Z0000 Encounter for general adult medical examination without abnormal findings: Secondary | ICD-10-CM

## 2013-03-29 DIAGNOSIS — I2510 Atherosclerotic heart disease of native coronary artery without angina pectoris: Secondary | ICD-10-CM

## 2013-03-29 NOTE — Progress Notes (Signed)
Patient ID: Isabella [name], female   DOB: [birth year], 72 y.o.   MRN: [id number]    [address], [place]  [address] Phone: [phone] Fax:  [phone]  Date:  03/29/2013   ID:  Leanor, Voris 03-23-1941, MRN [id number]  PCP:  Marjorie Smolder, MD   ASSESSMENT:  1. Coronary artery disease, stable. Stable without angina. She is status post myocardial infarction in 2012. She received drug-eluting stents to the right coronary and is now only on aspirin therapy and stable.  2. Hypertension, stable  3. Hyperlipidemia. Stable medical regimen  4. Preventive health care with flu shot being given today  PLAN:  1. Continue current medical regimen as listed 2. Active lifestyle to prevent further weight gain 3. Flu shot/vaccination   SUBJECTIVE: Isabella [name] is a 72 y.o. female who is doing well without cardiac complaints. She is gaining weight. She denies orthopnea PND. No medication side effects. She denies orthopnea and PND.   Wt Readings from Last 3 Encounters:  03/29/13 226 lb (102.513 kg)  03/16/13 220 lb (99.791 kg)  08/25/12 208 lb (94.348 kg)     Past Medical History  Diagnosis Date  . Hypertension   . High cholesterol   . Coronary artery disease   . Myocardial infarct 03/24/2011    Dr. [name]  . Sleep apnea     uses cpap  . Complication of anesthesia     Took a while to wake knee  . History of blood transfusion   . Lung nodule   . Arthritis     Current Outpatient Prescriptions  Medication Sig Dispense Refill  . aspirin 81 MG tablet Take 81 mg by mouth daily.      Marland Kitchen atorvastatin (LIPITOR) 40 MG tablet Take 40 mg by mouth at bedtime.      . chlorpheniramine (CHLOR-TRIMETON) 4 MG tablet Take 8 mg by mouth at bedtime.      . furosemide (LASIX) 40 MG tablet Take 40 mg by mouth daily before breakfast.       . metoprolol tartrate (LOPRESSOR) 25 MG tablet Take 25 mg by mouth 2 (two) times daily.      . nitroGLYCERIN (NITROSTAT) 0.4 MG SL  tablet Place 0.4 mg under the tongue as needed. For chest pain      . olmesartan (BENICAR) 40 MG tablet Take 40 mg by mouth daily before breakfast.        No current facility-administered medications for this visit.    Allergies:    Allergies  Allergen Reactions  . Codeine     Back hurts  . Erythromycin Diarrhea  . Neomycin Swelling  . Penicillins Hives  . Percocet [Oxycodone-Acetaminophen]     confusion  . Zestril [Lisinopril] Cough    Social History:  The patient  reports that she has never smoked. She has never used smokeless tobacco. She reports that she does not drink alcohol or use illicit drugs.   ROS:  Please see the history of present illness.   Weight gain. Has done well since right knee surgery. No chills or fever. No recent falls or trauma.   All other systems reviewed and negative.   OBJECTIVE: VS:  BP 158/90  Pulse 61  Ht 5' 2"  (1.575 m)  Wt 226 lb (102.513 kg)  BMI 41.33 kg/m2 Well nourished, well developed, in no acute distress, obese HEENT: normal Neck: JVD flat. Carotid bruit 2+  Cardiac:  normal S1, S2; RRR; no murmur  Lungs:  clear to auscultation bilaterally, no wheezing, rhonchi or rales Abd: soft, nontender, no hepatomegaly Ext: Edema no edema. Pulses 2+. No right femoral bruit noted. Skin: warm and dry Neuro:  CNs 2-12 intact, no focal abnormalities noted  EKG:  Normal       Signed, Illene Labrador III, MD 03/29/2013 2:33 PM

## 2013-03-29 NOTE — Patient Instructions (Signed)
Your physician recommends that you continue on your current medications as directed. Please refer to the Current Medication list given to you today.  Your physician wants you to follow-up in: 1 year. You will receive a reminder letter in the mail two months in advance. If you don't receive a letter, please call our office to schedule the follow-up appointment.  

## 2013-04-21 ENCOUNTER — Ambulatory Visit (INDEPENDENT_AMBULATORY_CARE_PROVIDER_SITE_OTHER): Payer: Medicare Other | Admitting: Pulmonary Disease

## 2013-04-21 ENCOUNTER — Encounter: Payer: Self-pay | Admitting: Pulmonary Disease

## 2013-04-21 VITALS — BP 146/94 | HR 54 | Temp 97.9°F | Ht 62.0 in | Wt 228.6 lb

## 2013-04-21 DIAGNOSIS — G4733 Obstructive sleep apnea (adult) (pediatric): Secondary | ICD-10-CM

## 2013-04-21 DIAGNOSIS — R9389 Abnormal findings on diagnostic imaging of other specified body structures: Secondary | ICD-10-CM | POA: Insufficient documentation

## 2013-04-21 MED ORDER — PREDNISONE 10 MG PO TABS: ORAL_TABLET | ORAL | Status: DC

## 2013-04-21 NOTE — Assessment & Plan Note (Signed)
The patient has multiple small pulmonary nodules, with a dominant left lower lobe process. This large density is in the major fissure, and shows granulomatous inflammation on transthoracic needle aspiration. The special stains were all negative, and the patient has had a negative quantiferon gold testing.   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 Her clinical picture really does not fit with this. I would like to treat her with a course of prednisone over the next 2 months, and then do a followup CT for comparison. The patient is agreeable to this approach.

## 2013-04-21 NOTE — Patient Instructions (Signed)
Will treat with a course of prednisone, with followup scan of chest in 2 mos.  Take 38m each day for 14days, then 315meach day for one week, then 2044mach day for one week, then take 2m37mch day until visit with me. Will see you back in 2mos80mod will schedule scan of chest just before the visit. Will put your cpap machine on the auto mode to re-optimize your pressure.  Will call you once I receive the download.

## 2013-04-21 NOTE — Assessment & Plan Note (Signed)
The patient is wearing CPAP compliantly, but has been having some breakthrough snoring. She tells me that her weight has increased significantly, and therefore I suspect her pressure is not adequate. Will put her on the automatic setting for the next few weeks to optimize her pressure again.

## 2013-04-21 NOTE — Progress Notes (Signed)
   Subjective:    Patient ID: Isabella [name], female    DOB: [birth year], 72 y.o.   MRN: [id number]  HPI The patient is a 72 year old female who I've been asked to see for an abnormal CT chest. She was noted to have bilateral pulmonary nodules in November of 2013, and she underwent PET scanning which showed increased uptake in her left lower lobe nodule which was the dominant process. She had no significant hilar or mediastinal adenopathy and no PET uptake. 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 Special stains were negative for infectious process. She has subsequently had followup scans, with most recent being in November of this year. This showed stable bilateral nodules, as well as an increase in her left lower lobe nodule/mass. It should be noted this process is in the major fissure. The patient has never lived in the [place] or [place], and has no history of TB exposure. She has had a negative PPD in the past, and a negative quantiferon gold test recently.  She denies any significant cough or chest discomfort, nor does she have any worsening dyspnea on exertion. She denies any night sweats, or worsening joint pain that is above her usual baseline. The patient has no unusual pets or hobbies, but states that her daughter does have a Retail banker.  The patient also has a history of obstructive sleep apnea, and has not been seen in almost 3 years. She is wearing her CPAP compliantly, but has seen some breakthrough snoring. She states that her weight has increased significantly over the years.   Review of Systems  Constitutional: Positive for unexpected weight change. Negative for fever.  HENT: Positive for postnasal drip. Negative for congestion, dental problem, ear pain, nosebleeds, rhinorrhea, sinus pressure, sneezing, sore throat and trouble swallowing.   Eyes: Negative for  redness and itching.  Respiratory: Positive for choking ( w/temp change and eating) and shortness of breath ( exertion). Negative for cough, chest tightness and wheezing.   Cardiovascular: Negative for palpitations and leg swelling.  Gastrointestinal: Negative for nausea and vomiting.  Genitourinary: Negative for dysuria.  Musculoskeletal: Negative for joint swelling.  Skin: Negative for rash.  Neurological: Negative for headaches.  Hematological: Does not bruise/bleed easily.  Psychiatric/Behavioral: Negative for dysphoric mood. The patient is nervous/anxious.        Objective:   Physical Exam Constitutional:  Obese female, no acute distress  HENT:  Nares patent without discharge  Oropharynx without exudate, palate and uvula are normal  Eyes:  Perrla, eomi, no scleral icterus  Neck:  No JVD, no TMG  Cardiovascular:  Normal rate, regular rhythm, no rubs or gallops.  No murmurs        Intact distal pulses  Pulmonary :  Normal breath sounds, no stridor or respiratory distress   No rales, rhonchi, or wheezing  Abdominal:  Soft, nondistended, bowel sounds present.  No tenderness noted.   Musculoskeletal:  1+ lower extremity edema noted.  Lymph Nodes:  No cervical lymphadenopathy noted  Skin:  No cyanosis noted  Neurologic:  Alert, appropriate, moves all 4 extremities without obvious deficit.         Assessment & Plan:

## 2013-05-13 HISTORY — PX: BACK SURGERY: SHX140

## 2013-06-13 ENCOUNTER — Other Ambulatory Visit: Payer: Self-pay | Admitting: Pulmonary Disease

## 2013-06-13 DIAGNOSIS — G4733 Obstructive sleep apnea (adult) (pediatric): Secondary | ICD-10-CM

## 2013-06-14 ENCOUNTER — Telehealth: Payer: Self-pay | Admitting: Pulmonary Disease

## 2013-06-14 NOTE — Telephone Encounter (Signed)
I called to spoke with pt. She reports someone has already called and spoken with her. Nothing further needed

## 2013-06-17 ENCOUNTER — Ambulatory Visit (INDEPENDENT_AMBULATORY_CARE_PROVIDER_SITE_OTHER)
Admission: RE | Admit: 2013-06-17 | Discharge: 2013-06-17 | Disposition: A | Discharge status: Home / Self Care | Payer: Medicare Other | Source: Ambulatory Visit | Attending: Pulmonary Disease | Admitting: Pulmonary Disease

## 2013-06-17 DIAGNOSIS — R9389 Abnormal findings on diagnostic imaging of other specified body structures: Secondary | ICD-10-CM

## 2013-06-21 ENCOUNTER — Telehealth: Payer: Self-pay | Admitting: Pulmonary Disease

## 2013-06-21 NOTE — Telephone Encounter (Signed)
Pt's appointment has been rescheduled for 07/14/13 at 9:30am.

## 2013-06-22 ENCOUNTER — Ambulatory Visit: Payer: Medicare Other | Admitting: Pulmonary Disease

## 2013-06-29 ENCOUNTER — Ambulatory Visit: Payer: Medicare Other | Admitting: Thoracic Surgery (Cardiothoracic Vascular Surgery)

## 2013-07-13 ENCOUNTER — Ambulatory Visit (INDEPENDENT_AMBULATORY_CARE_PROVIDER_SITE_OTHER): Payer: Medicare Other | Admitting: Thoracic Surgery (Cardiothoracic Vascular Surgery)

## 2013-07-13 ENCOUNTER — Encounter: Payer: Self-pay | Admitting: Thoracic Surgery (Cardiothoracic Vascular Surgery)

## 2013-07-13 VITALS — BP 146/78 | HR 74 | Resp 16 | Ht 62.0 in | Wt 226.0 lb

## 2013-07-13 DIAGNOSIS — Z9889 Other specified postprocedural states: Secondary | ICD-10-CM

## 2013-07-13 DIAGNOSIS — J8410 Pulmonary fibrosis, unspecified: Secondary | ICD-10-CM

## 2013-07-13 DIAGNOSIS — R918 Other nonspecific abnormal finding of lung field: Secondary | ICD-10-CM

## 2013-07-13 DIAGNOSIS — J984 Other disorders of lung: Secondary | ICD-10-CM

## 2013-07-13 NOTE — Progress Notes (Signed)
HPI:  Isabella [name] returns for followup of a left lower lobe nodule.  She is a 73 year old woman who was first found to have point nodules on a chest x-ray that she was having as part of her preoperative evaluation for a total knee replacement. That was in November of 2013. A PET CT showed a 1 nodule in the left lower lobe was hypermetabolic. 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 She went ahead and had her knee replacement. We elected to follow the nodules.  She was last in the office in November at which time the left lower lobe nodule had increased in size. She had previously seen Dr. Gwenette [name] so I referred her back to him and he started her on prednisone. She tolerated the prednisone well. She did have some weight gain but no other side effects. She has recently stopped taking the prednisone because her prescription of bone and she wasn't sure she needed to get it refilled.  She missed an appointment with Dr. Gwenette [name] due to snow, but is scheduled to see him tomorrow  Past Medical History  Diagnosis Date  . Hypertension   . High cholesterol   . Coronary artery disease   . Myocardial infarct 03/24/2011    Dr. Tamala [name]  . Sleep apnea     uses cpap  . Complication of anesthesia     Took a while to wake knee  . History of blood transfusion   . Lung nodule   . Arthritis       Current Outpatient Prescriptions  Medication Sig Dispense Refill  . aspirin 81 MG tablet Take 81 mg by mouth daily.      Marland Kitchen atorvastatin (LIPITOR) 40 MG tablet Take 40 mg by mouth at bedtime.      . chlorpheniramine (CHLOR-TRIMETON) 4 MG tablet Take 8 mg by mouth at bedtime.      . furosemide (LASIX) 40 MG tablet Take 40 mg by mouth daily before breakfast.       . metoprolol tartrate (LOPRESSOR) 25 MG tablet Take 25 mg by mouth 2 (two) times daily.      . nitroGLYCERIN (NITROSTAT) 0.4 MG SL tablet Place 0.4 mg under the tongue as needed. For chest pain      . valsartan (DIOVAN) 160 MG tablet  Take 160 mg by mouth daily.       No current facility-administered medications for this visit.    Physical Exam BP 146/78  Pulse 74  Resp 16  Ht 5' 2"  (1.575 m)  Wt 226 lb (102.513 kg)  BMI 41.33 kg/m2  SpO2 33% Obese 73 year old white female in no acute distress No cervical or subclavicular adenopathy Cardiac regular rate and rhythm normal S1 and S2 Lungs clear with equal breath sounds bilaterally   Diagnostic Tests:  CT chest 06/17/2013 CT CHEST WITHOUT CONTRAST  TECHNIQUE:  Multidetector CT imaging of the chest was performed following the  standard protocol without IV contrast.  COMPARISON: CT chest dated 03/16/2013 and 08/15/2012. Correlation  with the reports of prior PET-CT dated 05/08/2012 and CT chest dated  03/26/2012, although those studies could not be loaded to PACs due  to a server error at the time of interpretation.  FINDINGS:  7 x 24 mm irregular opacity in the left lower lobe (series 3/image  38), corresponding to the biopsy-proven non-necrotizing epithelioid  granuloma, previously 2.4 x 2.9 cm. This opacity has been  waxing/waning on multiple prior studies.  12 x 11 mm medial right lower lobe  nodule (series 3/ image 38),  unchanged.  Numerous additional small bilateral pulmonary nodules throughout all  lobes, measuring 2-6 mm, unchanged.  No pleural effusion or pneumothorax.  1.9 cm right lower thyroid nodule, grossly unchanged.  Heart is normal in size. No pericardial effusion Right coronary  stent. Fatty scarring at the left ventricular apex (series 2/image  37), reflecting prior myocardial infarction. Mild atherosclerotic  calcifications of the aortic arch.  No suspicious mediastinal or axillary lymphadenopathy.  Visualized upper abdomen is notable for scattered hepatic cysts.  Degenerative changes of the visualized thoracolumbar spine.  IMPRESSION:  0.7 x 2.4 cm irregular opacity in the left lower lobe, corresponding  to the biopsy-proven  non-necrotizing epithelioid granuloma,  decreased (2.4 x 2.9 cm on the most recent prior).  12 mm medial right lower lobe nodule, unchanged.  Numerous additional small bilateral pulmonary nodules measuring up  to 6 mm, unchanged.  Consider additional follow-up of the waxing/waning left lower lobe  lesion as clinically warranted. By report, the dominant right lower  lobe nodule has been stable for approximately 14 months, and follow  up in 10 months is suggested to document 2 year stability.  This recommendation follows the consensus statement: Guidelines for  Management of Small Pulmonary Nodules Detected on CT Scans: A  Statement from the Isabella [name] as published in Radiology  2005; 237:395-400.  Electronically Signed  By: [name] Hy M.D.  On: 06/17/2013 16:28  Impression: Isabella [name] has had marked improvement of the dominant left lower lobe nodule with prednisone. 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 She does have multiple other pulmonary nodules. The 11 mm nodule in the right lower lobe is unchanged.  She will see Dr. Gwenette [name] tomorrow. He is managing her sleep apnea and also is treating her sarcoidosis. Given that, I don't think it is necessary for her to continue to followup with me. However, I would be happy to see her back if I can be of any assistance with her care going forward

## 2013-07-14 ENCOUNTER — Other Ambulatory Visit: Payer: Self-pay

## 2013-07-14 ENCOUNTER — Ambulatory Visit (INDEPENDENT_AMBULATORY_CARE_PROVIDER_SITE_OTHER): Payer: Medicare Other | Admitting: Pulmonary Disease

## 2013-07-14 ENCOUNTER — Encounter: Payer: Self-pay | Admitting: Pulmonary Disease

## 2013-07-14 VITALS — BP 128/86 | HR 66 | Temp 98.1°F | Ht 62.0 in | Wt 229.0 lb

## 2013-07-14 DIAGNOSIS — D869 Sarcoidosis, unspecified: Secondary | ICD-10-CM

## 2013-07-14 DIAGNOSIS — R9389 Abnormal findings on diagnostic imaging of other specified body structures: Secondary | ICD-10-CM

## 2013-07-14 DIAGNOSIS — G4733 Obstructive sleep apnea (adult) (pediatric): Secondary | ICD-10-CM

## 2013-07-14 DIAGNOSIS — Z1231 Encounter for screening mammogram for malignant neoplasm of breast: Secondary | ICD-10-CM

## 2013-07-14 NOTE — Assessment & Plan Note (Signed)
The patient is doing well with CPAP, and her pressure has been optimized again based on her recent auto titration study at home. I've asked her to keep up with her mask changes and supplies, and to work aggressively on weight loss.

## 2013-07-14 NOTE — Assessment & Plan Note (Signed)
The patient has had a followup CT which showed significant decrease in her left lower lobe nodule, and stability in her other nodules. Clearly she has had a good response to a course of prednisone, and more than likely this represents sarcoidosis. Because the patient is asymptomatic otherwise, I see no reason to leave her on prednisone at this time. We'll therefore taper her off. We do still need to follow that the right lower lobe 12 mm nodule to complete the two-year process. This will entail a followup CT in November of this year.

## 2013-07-14 NOTE — Patient Instructions (Signed)
Finish up prednisone by taking 37m a day until gone. Will do a followup scan of chest in November, and if unchanged, no further followup. Continue with cpap on the auto setting. Work on weight loss followup with me again in one year.

## 2013-07-14 NOTE — Progress Notes (Signed)
   Subjective:    Patient ID: Isabella [name], female    DOB: 04-21-1941, 73 y.o.   MRN: [id number]  HPI Patient comes in today for followup of her known pulmonary nodules, felt secondary to sarcoidosis. At the last visit, we started her only 4 week course of prednisone to see if her left lower lobe process would improve. She had a followup CT chest which showed significant decrease in size of the left lower lobe process, and other nodules were stable. This makes [place] feel more comfortable the left lower lobe process is indeed A. granulomatous issue and not an occult cancer.  The patient discontinued her prednisone on her own a week ago, and will need to finish up her taper at minimal doses. The patient also has been staying on her CPAP, and her pressure is been re\re optimized.   Review of Systems  Constitutional: Negative for fever and unexpected weight change.  HENT: Positive for postnasal drip. Negative for congestion, dental problem, ear pain, nosebleeds, rhinorrhea, sinus pressure, sneezing, sore throat and trouble swallowing.   Eyes: Negative for redness and itching.  Respiratory: Positive for shortness of breath. Negative for cough, chest tightness and wheezing.   Cardiovascular: Negative for palpitations and leg swelling.  Gastrointestinal: Negative for nausea and vomiting.  Genitourinary: Negative for dysuria.  Musculoskeletal: Negative for joint swelling.  Skin: Negative for rash.  Neurological: Negative for headaches.  Hematological: Does not bruise/bleed easily.  Psychiatric/Behavioral: Negative for dysphoric mood. The patient is not nervous/anxious.        Objective:   Physical Exam Overweight female in no acute distress Nose without purulence or discharge noted No skin breakdown or pressure necrosis from the CPAP Neck without lymphadenopathy or thyromegaly Chest totally clear to auscultation, no wheezing Cardiac exam with regular rate and rhythm Lower extremities with 2+  edema, no cyanosis Alert and oriented, moves all 4 extremities.       Assessment & Plan:

## 2013-07-23 ENCOUNTER — Ambulatory Visit
Admission: RE | Admit: 2013-07-23 | Discharge: 2013-07-23 | Disposition: A | Discharge status: Home / Self Care | Payer: Medicare Other | Source: Ambulatory Visit

## 2013-07-23 DIAGNOSIS — Z1231 Encounter for screening mammogram for malignant neoplasm of breast: Secondary | ICD-10-CM

## 2013-10-21 ENCOUNTER — Encounter: Payer: Self-pay | Admitting: Interventional Cardiology

## 2013-10-21 ENCOUNTER — Other Ambulatory Visit: Payer: Self-pay | Admitting: *Deleted

## 2013-10-21 MED ORDER — NITROGLYCERIN 0.4 MG SL SUBL: 0.4000 mg | SUBLINGUAL_TABLET | SUBLINGUAL | Status: DC | PRN

## 2013-10-22 ENCOUNTER — Other Ambulatory Visit: Payer: Self-pay | Admitting: Family Medicine

## 2013-10-22 DIAGNOSIS — E559 Vitamin D deficiency, unspecified: Secondary | ICD-10-CM

## 2013-10-28 ENCOUNTER — Ambulatory Visit
Admission: RE | Admit: 2013-10-28 | Discharge: 2013-10-28 | Disposition: A | Discharge status: Home / Self Care | Payer: Medicare Other | Source: Ambulatory Visit | Attending: Family Medicine | Admitting: Family Medicine

## 2013-10-28 DIAGNOSIS — E559 Vitamin D deficiency, unspecified: Secondary | ICD-10-CM

## 2013-11-17 ENCOUNTER — Other Ambulatory Visit: Payer: Self-pay | Admitting: Physical Medicine and Rehabilitation

## 2013-11-17 DIAGNOSIS — S7002XA Contusion of left hip, initial encounter: Secondary | ICD-10-CM

## 2013-11-19 ENCOUNTER — Ambulatory Visit
Admission: RE | Admit: 2013-11-19 | Discharge: 2013-11-19 | Disposition: A | Discharge status: Home / Self Care | Payer: Medicare Other | Source: Ambulatory Visit | Attending: Physical Medicine and Rehabilitation | Admitting: Physical Medicine and Rehabilitation

## 2013-11-19 ENCOUNTER — Other Ambulatory Visit: Payer: Self-pay | Admitting: Pulmonary Disease

## 2013-11-19 DIAGNOSIS — G4733 Obstructive sleep apnea (adult) (pediatric): Secondary | ICD-10-CM

## 2013-11-19 DIAGNOSIS — S7002XA Contusion of left hip, initial encounter: Secondary | ICD-10-CM

## 2014-01-20 ENCOUNTER — Other Ambulatory Visit (HOSPITAL_COMMUNITY): Payer: Self-pay | Admitting: Neurosurgery

## 2014-01-25 ENCOUNTER — Encounter (HOSPITAL_COMMUNITY): Payer: Self-pay | Admitting: Pharmacy Technician

## 2014-01-26 ENCOUNTER — Other Ambulatory Visit (HOSPITAL_COMMUNITY): Payer: Self-pay | Admitting: *Deleted

## 2014-01-26 NOTE — Pre-Procedure Instructions (Addendum)
Isabella [name] [facility]  01/26/2014   Your procedure is scheduled on:  Tuesday, February 01, 2014    Report to [facility] Entrance "A" Admitting Office at 8:00 AM.   Call this number if you have problems the morning of surgery: [phone]   Remember:   Do not eat food or drink liquids after midnight Monday, 01/31/14.   Take these medicines the morning of surgery with A SIP OF WATER: metoprolol tartrate (LOPRESSOR), nitroGLYCERIN (NITROSTAT) - if needed.   Stop Aspirin as of today.   STOP/ Do not take Aspirin, Aleve, Naproxen, Advil, Ibuprofen, Motrin, Vitamins, Herbs, or Supplements starting today   Do not wear jewelry, make-up or nail polish.  Do not wear lotions, powders, or perfumes. You may wear deodorant.  Do not shave 48 hours prior to surgery.   Do not bring valuables to the hospital.  [facility] is not responsible for any belongings or valuables.               Contacts, dentures or bridgework may not be worn into surgery.  Leave suitcase in the car. After surgery it may be brought to your room.  For patients admitted to the hospital, discharge time is determined by your treatment team.              Special Instructions: [place] - Preparing for Surgery  Before surgery, you can play an important role.  Because skin is not sterile, your skin needs to be as free of germs as possible.  You can reduce the number of germs on you skin by washing with CHG (chlorahexidine gluconate) soap before surgery.  CHG is an antiseptic cleaner which kills germs and bonds with the skin to continue killing germs even after washing.  Please DO NOT use if you have an allergy to CHG or antibacterial soaps.  If your skin becomes reddened/irritated stop using the CHG and inform your nurse when you arrive at Short Stay.  Do not shave (including legs and underarms) for at least 48 hours prior to the first CHG shower.  You may shave your face.  Please follow these instructions carefully:   1.   Shower with CHG Soap the night before surgery and the morning of Surgery.  2.  If you choose to wash your hair, wash your hair first as usual with your normal shampoo.  3.  After you shampoo, rinse your hair and body thoroughly to remove the shampoo.  4.  Use CHG as you would any other liquid soap.  You can apply CHG directly to the skin and wash gently with scrungie or a clean washcloth.  5.  Apply the CHG Soap to your body ONLY FROM THE NECK DOWN.  Do not use on open wounds or open sores.  Avoid contact with your eyes, ears, mouth and genitals (private parts).  Wash genitals (private parts) with your normal soap.  6.  Wash thoroughly, paying special attention to the area where your surgery will be performed.  7.  Thoroughly rinse your body with warm water from the neck down.  8.  DO NOT shower/wash with your normal soap after using and rinsing off the CHG Soap.  9.  Pat yourself dry with a clean towel.            10.  Wear clean pajamas.            11.  Place clean sheets on your bed the night of your first shower and do  not sleep with pets.  Day of Surgery  Do not apply any lotions the morning of surgery.  Please wear clean clothes to the hospital/surgery center.     Please read over the following fact sheets that you were given: Pain Booklet, Coughing and Deep Breathing, Blood Transfusion Information, MRSA Information and Surgical Site Infection Prevention

## 2014-01-27 ENCOUNTER — Encounter (HOSPITAL_COMMUNITY): Payer: Self-pay

## 2014-01-27 ENCOUNTER — Encounter (HOSPITAL_COMMUNITY)
Admission: RE | Admit: 2014-01-27 | Discharge: 2014-01-27 | Disposition: A | Discharge status: Home / Self Care | Payer: Medicare Other | Source: Ambulatory Visit | Attending: Neurosurgery | Admitting: Neurosurgery

## 2014-01-27 DIAGNOSIS — M48061 Spinal stenosis, lumbar region without neurogenic claudication: Secondary | ICD-10-CM | POA: Diagnosis present

## 2014-01-27 DIAGNOSIS — M7130 Other bursal cyst, unspecified site: Secondary | ICD-10-CM | POA: Insufficient documentation

## 2014-01-27 DIAGNOSIS — Q762 Congenital spondylolisthesis: Secondary | ICD-10-CM | POA: Insufficient documentation

## 2014-01-27 DIAGNOSIS — Z01812 Encounter for preprocedural laboratory examination: Secondary | ICD-10-CM | POA: Insufficient documentation

## 2014-01-27 HISTORY — DX: Cystocele, unspecified: N81.10

## 2014-01-27 HISTORY — DX: Pulmonary fibrosis, unspecified: J84.10

## 2014-01-27 LAB — CBC
HEMATOCRIT: 43.4 % (ref 36.0–46.0)
Hemoglobin: 14.7 g/dL (ref 12.0–15.0)
MCH: 30.4 pg (ref 26.0–34.0)
MCHC: 33.9 g/dL (ref 30.0–36.0)
MCV: 89.7 fL (ref 78.0–100.0)
Platelets: 225 10*3/uL (ref 150–400)
RBC: 4.84 MIL/uL (ref 3.87–5.11)
RDW: 14.1 % (ref 11.5–15.5)
WBC: 5.1 10*3/uL (ref 4.0–10.5)

## 2014-01-27 LAB — BASIC METABOLIC PANEL
ANION GAP: 13 (ref 5–15)
BUN: 12 mg/dL (ref 6–23)
CALCIUM: 9.2 mg/dL (ref 8.4–10.5)
CHLORIDE: 103 meq/L (ref 96–112)
CO2: 29 mEq/L (ref 19–32)
CREATININE: 0.63 mg/dL (ref 0.50–1.10)
GFR, EST NON AFRICAN AMERICAN: 88 mL/min — AB (ref >90)
Glucose, Bld: 102 mg/dL — ABNORMAL HIGH (ref 70–99)
Potassium: 3.6 mEq/L — ABNORMAL LOW (ref 3.7–5.3)
Sodium: 145 mEq/L (ref 137–147)

## 2014-01-27 LAB — TYPE AND SCREEN
ABO/RH(D): A POS
Antibody Screen: NEGATIVE

## 2014-01-27 LAB — SURGICAL PCR SCREEN
MRSA, PCR: NEGATIVE
STAPHYLOCOCCUS AUREUS: POSITIVE — AB

## 2014-01-27 NOTE — Progress Notes (Signed)
Spoke to [place], [place] regarding CXR she advised anesthesia would accept CT scan from 06/2013 and did not need to repeat CXR.

## 2014-01-28 NOTE — Progress Notes (Signed)
Anesthesia chart review:  Patient is a 73 year old female scheduled for L3-4, L4-5 PLIF on 02/01/14 by Dr. Kathyrn Sheriff.  History includes non-smoker, CAD/STEMI 03/24/11 s/p DES to proximal RCA and PTCA distal RCA, post-cath right groin hematoma s/p surgical repair 03/25/11, OSA with CPAP use, lung nodule/granulomatous disease (rsponsive to prednisone; consideration of Sarcoidosis; followed by Dr. Gwenette Greet), hyperlipidemia, arthritis, T&A, hysterectomy, right TKA 06/17/12. She reported that she took a while to wake up after her knee surgery. BMI is consistent with morbid obesity. PCP is Dr. [name]. Cardiologist is Dr. Daneen Schick, last visit 03/29/13. She was doing well from a cardiac standpoint and one year follow-up was recommended.  Meds: ASA (on hold), Lipitor, chlorpheniramine, furosemide, metoprolol, Nitro, valsartan.   Echo on 03/25/11 showed: Left ventricle: The cavity size was normal. There was mild concentric hypertrophy. Systolic function was normal. The estimated ejection fraction was in the range of 55% to 60%. Wall motion was normal; there were no regional wall motion abnormalities. There was an increased relative contribution of atrial contraction to ventricular filling. Doppler parameters are consistent with abnormal left ventricular relaxation (grade 1 diastolic dysfunction).  Cardiac cath on 03/24/11 (done for acute inferior STEMI) showed: Widely patent left main. Ectatic LAD wrapping around the left ventricular apex. One large diagonal arises from its bed. LCX is moderate in size with 2 large OM branches free of disease. RCA is widely patent proximal vessel bifurcating into a PDA and large left ventricular branch. The left ventricular branch proximal portion is much smaller than the distal vessel raising the question of spontaneous dissection.The junction between this narrowed region and the more normal appearing distal vessel contains a 99% stenosis that we felt represented the culprit  lesion. She underwent 1) Successful angioplasty of the focal high-grade stenosis in the left ventricular branch of the right coronary from 99% to less than 30%. 2) Successful drug-eluting stent implantation of the ostium of the right coronary as treatment of either a spontaneous or heart catheter-induced ostial dissection.    EKG on 03/29/13 showed: SB @ 59 bpm, RAE.  She is getting serial chest CT to evaluate for bilateral lung nodules.  Her last scan was on 06/17/13 and showed: No pleural effusion or pneumothorax. 1.9 cm right lower thyroid nodule, grossly unchanged. Heart is normal in size. No pericardial effusion Right coronary stent. Fatty scarring at the left ventricular apex (series 2/image 37), reflecting prior myocardial infarction. Mild atherosclerotic calcifications of the aortic arch. No suspicious mediastinal or axillary lymphadenopathy. Visualized upper abdomen is notable for scattered hepatic cysts. Degenerative changes of the visualized thoracolumbar spine. 0.7 x 2.4 cm irregular opacity in the left lower lobe, corresponding to the biopsy-proven non-necrotizing epithelioid granuloma, decreased (2.4 x 2.9 cm on the most recent prior). 12 mm medial right lower lobe nodule, unchanged. Numerous additional small bilateral pulmonary nodules measuring up to 6 mm, unchanged. Consider additional follow-up of the waxing/waning left lower lobe lesion as clinically warranted. By report, the dominant right lower lobe nodule has been stable for approximately 14 months, and follow up in 10 months is suggested to document 2 year stability.  (Dr. Gwenette Greet is recommending a follow-up CT in 03/2014.  I felt that with her frequent chest CT scans--one within the past year--that she would not need a preoperative CXR unless she developed new respiratory symptoms.)  Preoperative labs noted.    Patient with cath and PCI within the past three years.  She saw her cardiologist within the year and was doing  well at that  time.  No new cardiopulmonary symptoms reported at her PAT appointment yesterday. If no acute changes I anticipate that she can proceed as planned.  Anesthesiologist Dr. Marcie Bal agrees with this plan.  [facility]/Anesthesiology Phone [phone] 01/28/2014 2:12 PM

## 2014-02-01 ENCOUNTER — Encounter (HOSPITAL_COMMUNITY): Payer: Self-pay | Admitting: Surgery

## 2014-02-01 ENCOUNTER — Encounter (HOSPITAL_COMMUNITY): Payer: Medicare Other | Admitting: Vascular Surgery

## 2014-02-01 ENCOUNTER — Inpatient Hospital Stay (HOSPITAL_COMMUNITY)
Admission: RE | Admit: 2014-02-01 | Discharge: 2014-02-07 | DRG: 460 | Disposition: A | Discharge status: Home Health | Payer: Medicare Other | Source: Ambulatory Visit | Attending: Neurosurgery | Admitting: Neurosurgery

## 2014-02-01 ENCOUNTER — Inpatient Hospital Stay (HOSPITAL_COMMUNITY): Payer: Medicare Other | Admitting: Anesthesiology

## 2014-02-01 ENCOUNTER — Observation Stay (HOSPITAL_COMMUNITY): Payer: Medicare Other

## 2014-02-01 ENCOUNTER — Encounter (HOSPITAL_COMMUNITY)
Admission: RE | Disposition: A | Discharge status: Home Health | Payer: Self-pay | Source: Ambulatory Visit | Attending: Neurosurgery

## 2014-02-01 DIAGNOSIS — Z7982 Long term (current) use of aspirin: Secondary | ICD-10-CM

## 2014-02-01 DIAGNOSIS — Z96659 Presence of unspecified artificial knee joint: Secondary | ICD-10-CM

## 2014-02-01 DIAGNOSIS — I2510 Atherosclerotic heart disease of native coronary artery without angina pectoris: Secondary | ICD-10-CM | POA: Diagnosis present

## 2014-02-01 DIAGNOSIS — J8410 Pulmonary fibrosis, unspecified: Secondary | ICD-10-CM | POA: Diagnosis present

## 2014-02-01 DIAGNOSIS — I10 Essential (primary) hypertension: Secondary | ICD-10-CM | POA: Diagnosis present

## 2014-02-01 DIAGNOSIS — G4730 Sleep apnea, unspecified: Secondary | ICD-10-CM | POA: Diagnosis present

## 2014-02-01 DIAGNOSIS — M48062 Spinal stenosis, lumbar region with neurogenic claudication: Secondary | ICD-10-CM | POA: Diagnosis present

## 2014-02-01 DIAGNOSIS — M7130 Other bursal cyst, unspecified site: Principal | ICD-10-CM | POA: Diagnosis present

## 2014-02-01 DIAGNOSIS — E7800 Pure hypercholesterolemia, unspecified: Secondary | ICD-10-CM | POA: Diagnosis present

## 2014-02-01 DIAGNOSIS — N3289 Other specified disorders of bladder: Secondary | ICD-10-CM | POA: Diagnosis not present

## 2014-02-01 DIAGNOSIS — Z9861 Coronary angioplasty status: Secondary | ICD-10-CM

## 2014-02-01 DIAGNOSIS — Z6841 Body Mass Index (BMI) 40.0 and over, adult: Secondary | ICD-10-CM

## 2014-02-01 DIAGNOSIS — Z79899 Other long term (current) drug therapy: Secondary | ICD-10-CM

## 2014-02-01 DIAGNOSIS — I252 Old myocardial infarction: Secondary | ICD-10-CM

## 2014-02-01 DIAGNOSIS — Z947 Corneal transplant status: Secondary | ICD-10-CM

## 2014-02-01 DIAGNOSIS — M4310 Spondylolisthesis, site unspecified: Secondary | ICD-10-CM | POA: Diagnosis present

## 2014-02-01 SURGERY — POSTERIOR LUMBAR FUSION 2 LEVEL
Anesthesia: General

## 2014-02-01 MED ORDER — VANCOMYCIN HCL 10 G IV SOLR
1500.0000 mg | INTRAVENOUS | Status: AC
  Administered 2014-02-01: 1500 mg via INTRAVENOUS
  Filled 2014-02-01: qty 1500

## 2014-02-01 MED ORDER — MENTHOL 3 MG MT LOZG: 1.0000 | LOZENGE | OROMUCOSAL | Status: DC | PRN

## 2014-02-01 MED ORDER — FENTANYL CITRATE 0.05 MG/ML IJ SOLN
INTRAMUSCULAR | Status: DC | PRN
  Administered 2014-02-01 (×2): 100 ug via INTRAVENOUS
  Administered 2014-02-01: 50 ug via INTRAVENOUS
  Administered 2014-02-01: 100 ug via INTRAVENOUS
  Administered 2014-02-01 (×5): 50 ug via INTRAVENOUS

## 2014-02-01 MED ORDER — LIDOCAINE-EPINEPHRINE 1 %-1:100000 IJ SOLN
INTRAMUSCULAR | Status: DC | PRN
  Administered 2014-02-01: 10 mL

## 2014-02-01 MED ORDER — ACETAMINOPHEN 650 MG RE SUPP: 650.0000 mg | RECTAL | Status: DC | PRN

## 2014-02-01 MED ORDER — DEXAMETHASONE SODIUM PHOSPHATE 10 MG/ML IJ SOLN
INTRAMUSCULAR | Status: DC | PRN
  Administered 2014-02-01: 10 mg via INTRAVENOUS

## 2014-02-01 MED ORDER — SODIUM CHLORIDE 0.9 % IV SOLN
INTRAVENOUS | Status: DC
  Administered 2014-02-01 – 2014-02-02 (×2): via INTRAVENOUS

## 2014-02-01 MED ORDER — DOCUSATE SODIUM 100 MG PO CAPS
100.0000 mg | ORAL_CAPSULE | Freq: Two times a day (BID) | ORAL | Status: DC
  Administered 2014-02-01 – 2014-02-07 (×11): 100 mg via ORAL
  Filled 2014-02-01 (×12): qty 1

## 2014-02-01 MED ORDER — EPHEDRINE SULFATE 50 MG/ML IJ SOLN
INTRAMUSCULAR | Status: DC | PRN
  Administered 2014-02-01: 10 mg via INTRAVENOUS
  Administered 2014-02-01: 5 mg via INTRAVENOUS

## 2014-02-01 MED ORDER — DIAZEPAM 5 MG PO TABS
5.0000 mg | ORAL_TABLET | Freq: Four times a day (QID) | ORAL | Status: DC | PRN
  Administered 2014-02-03 – 2014-02-05 (×4): 5 mg via ORAL
  Filled 2014-02-01 (×4): qty 1

## 2014-02-01 MED ORDER — VANCOMYCIN HCL IN DEXTROSE 1-5 GM/200ML-% IV SOLN
1000.0000 mg | Freq: Once | INTRAVENOUS | Status: AC
  Administered 2014-02-01: 1000 mg via INTRAVENOUS
  Filled 2014-02-01: qty 200

## 2014-02-01 MED ORDER — HEPARIN SODIUM (PORCINE) 5000 UNIT/ML IJ SOLN
5000.0000 [IU] | Freq: Three times a day (TID) | INTRAMUSCULAR | Status: DC
  Administered 2014-02-02 – 2014-02-07 (×17): 5000 [IU] via SUBCUTANEOUS
  Filled 2014-02-01 (×17): qty 1

## 2014-02-01 MED ORDER — ROCURONIUM BROMIDE 50 MG/5ML IV SOLN
INTRAVENOUS | Status: AC
  Filled 2014-02-01: qty 1

## 2014-02-01 MED ORDER — LACTATED RINGERS IV SOLN
INTRAVENOUS | Status: DC | PRN
  Administered 2014-02-01 (×4): via INTRAVENOUS

## 2014-02-01 MED ORDER — SODIUM CHLORIDE 0.9 % IJ SOLN
3.0000 mL | INTRAMUSCULAR | Status: DC | PRN
Start: 2014-02-01 — End: 2014-02-07
  Administered 2014-02-06: 3 mL via INTRAVENOUS

## 2014-02-01 MED ORDER — ONDANSETRON HCL 4 MG/2ML IJ SOLN: 4.0000 mg | Freq: Four times a day (QID) | INTRAMUSCULAR | Status: DC | PRN

## 2014-02-01 MED ORDER — ROCURONIUM BROMIDE 100 MG/10ML IV SOLN
INTRAVENOUS | Status: DC | PRN
  Administered 2014-02-01: 50 mg via INTRAVENOUS
  Administered 2014-02-01: 20 mg via INTRAVENOUS

## 2014-02-01 MED ORDER — LIDOCAINE HCL (CARDIAC) 20 MG/ML IV SOLN
INTRAVENOUS | Status: DC | PRN
  Administered 2014-02-01: 80 mg via INTRAVENOUS

## 2014-02-01 MED ORDER — PHENOL 1.4 % MT LIQD
1.0000 | OROMUCOSAL | Status: DC | PRN
  Administered 2014-02-01: 1 via OROMUCOSAL
  Filled 2014-02-01: qty 177

## 2014-02-01 MED ORDER — ONDANSETRON HCL 4 MG/2ML IJ SOLN
INTRAMUSCULAR | Status: DC | PRN
  Administered 2014-02-01: 4 mg via INTRAVENOUS

## 2014-02-01 MED ORDER — NEOSTIGMINE METHYLSULFATE 10 MG/10ML IV SOLN
INTRAVENOUS | Status: DC | PRN
  Administered 2014-02-01: 3 mg via INTRAVENOUS

## 2014-02-01 MED ORDER — ACETAMINOPHEN 10 MG/ML IV SOLN
INTRAVENOUS | Status: AC
  Administered 2014-02-01: 1000 mg via INTRAVENOUS
  Filled 2014-02-01: qty 100

## 2014-02-01 MED ORDER — FENTANYL CITRATE 0.05 MG/ML IJ SOLN
INTRAMUSCULAR | Status: AC
  Filled 2014-02-01: qty 5

## 2014-02-01 MED ORDER — ACETAMINOPHEN 325 MG PO TABS: 650.0000 mg | ORAL_TABLET | ORAL | Status: DC | PRN

## 2014-02-01 MED ORDER — SODIUM CHLORIDE 0.9 % IV SOLN: 250.0000 mL | INTRAVENOUS | Status: DC

## 2014-02-01 MED ORDER — SENNA 8.6 MG PO TABS
1.0000 | ORAL_TABLET | Freq: Two times a day (BID) | ORAL | Status: DC
  Administered 2014-02-01 – 2014-02-07 (×12): 8.6 mg via ORAL
  Filled 2014-02-01 (×12): qty 1

## 2014-02-01 MED ORDER — MIDAZOLAM HCL 5 MG/5ML IJ SOLN
INTRAMUSCULAR | Status: DC | PRN
  Administered 2014-02-01: 1 mg via INTRAVENOUS

## 2014-02-01 MED ORDER — SODIUM CHLORIDE 0.9 % IJ SOLN
3.0000 mL | Freq: Two times a day (BID) | INTRAMUSCULAR | Status: DC
  Administered 2014-02-03 – 2014-02-07 (×9): 3 mL via INTRAVENOUS

## 2014-02-01 MED ORDER — ATORVASTATIN CALCIUM 40 MG PO TABS
40.0000 mg | ORAL_TABLET | Freq: Every day | ORAL | Status: DC
  Administered 2014-02-01 – 2014-02-06 (×6): 40 mg via ORAL
  Filled 2014-02-01 (×6): qty 1

## 2014-02-01 MED ORDER — ONDANSETRON HCL 4 MG/2ML IJ SOLN: 4.0000 mg | INTRAMUSCULAR | Status: DC | PRN

## 2014-02-01 MED ORDER — FENTANYL CITRATE 0.05 MG/ML IJ SOLN
INTRAMUSCULAR | Status: AC
  Administered 2014-02-01: 50 ug via INTRAVENOUS
  Filled 2014-02-01: qty 2

## 2014-02-01 MED ORDER — SODIUM CHLORIDE 0.9 % IR SOLN
Status: DC | PRN
  Administered 2014-02-01: 10:00:00

## 2014-02-01 MED ORDER — FENTANYL CITRATE 0.05 MG/ML IJ SOLN
25.0000 ug | INTRAMUSCULAR | Status: DC | PRN
  Administered 2014-02-01 (×2): 50 ug via INTRAVENOUS

## 2014-02-01 MED ORDER — FUROSEMIDE 40 MG PO TABS
40.0000 mg | ORAL_TABLET | Freq: Every day | ORAL | Status: DC
  Administered 2014-02-02 – 2014-02-07 (×5): 40 mg via ORAL
  Filled 2014-02-01 (×6): qty 1

## 2014-02-01 MED ORDER — THROMBIN 20000 UNITS EX SOLR
CUTANEOUS | Status: DC | PRN
  Administered 2014-02-01: 11:00:00 via TOPICAL

## 2014-02-01 MED ORDER — GLYCOPYRROLATE 0.2 MG/ML IJ SOLN
INTRAMUSCULAR | Status: AC
  Filled 2014-02-01: qty 2

## 2014-02-01 MED ORDER — MORPHINE SULFATE 2 MG/ML IJ SOLN: 1.0000 mg | INTRAMUSCULAR | Status: DC | PRN

## 2014-02-01 MED ORDER — MIDAZOLAM HCL 2 MG/2ML IJ SOLN
INTRAMUSCULAR | Status: AC
  Filled 2014-02-01: qty 2

## 2014-02-01 MED ORDER — GLYCOPYRROLATE 0.2 MG/ML IJ SOLN
INTRAMUSCULAR | Status: DC | PRN
  Administered 2014-02-01: 0.4 mg via INTRAVENOUS

## 2014-02-01 MED ORDER — METOPROLOL TARTRATE 25 MG PO TABS
25.0000 mg | ORAL_TABLET | Freq: Two times a day (BID) | ORAL | Status: DC
  Administered 2014-02-01 – 2014-02-07 (×11): 25 mg via ORAL
  Filled 2014-02-01 (×11): qty 1

## 2014-02-01 MED ORDER — IRBESARTAN 150 MG PO TABS
150.0000 mg | ORAL_TABLET | Freq: Every day | ORAL | Status: DC
  Administered 2014-02-01 – 2014-02-07 (×6): 150 mg via ORAL
  Filled 2014-02-01 (×6): qty 1

## 2014-02-01 MED ORDER — ONDANSETRON HCL 4 MG/2ML IJ SOLN
INTRAMUSCULAR | Status: AC
  Filled 2014-02-01: qty 2

## 2014-02-01 MED ORDER — BUPIVACAINE HCL (PF) 0.5 % IJ SOLN
INTRAMUSCULAR | Status: DC | PRN
  Administered 2014-02-01: 10 mL

## 2014-02-01 MED ORDER — NITROGLYCERIN 0.4 MG SL SUBL: 0.4000 mg | SUBLINGUAL_TABLET | SUBLINGUAL | Status: DC | PRN

## 2014-02-01 MED ORDER — HYDROCODONE-ACETAMINOPHEN 10-325 MG PO TABS
1.0000 | ORAL_TABLET | ORAL | Status: DC | PRN
  Administered 2014-02-01 – 2014-02-05 (×7): 1 via ORAL
  Administered 2014-02-05: 2 via ORAL
  Administered 2014-02-05 – 2014-02-07 (×5): 1 via ORAL
  Filled 2014-02-01 (×7): qty 1
  Filled 2014-02-01: qty 2
  Filled 2014-02-01 (×4): qty 1
  Filled 2014-02-01: qty 2

## 2014-02-01 MED ORDER — LACTATED RINGERS IV SOLN
INTRAVENOUS | Status: DC
  Administered 2014-02-01: 09:00:00 via INTRAVENOUS

## 2014-02-01 MED ORDER — PROPOFOL 10 MG/ML IV BOLUS
INTRAVENOUS | Status: DC | PRN
  Administered 2014-02-01: 150 mg via INTRAVENOUS
  Administered 2014-02-01: 50 mg via INTRAVENOUS

## 2014-02-01 MED ORDER — PROPOFOL 10 MG/ML IV BOLUS
INTRAVENOUS | Status: AC
  Filled 2014-02-01: qty 20

## 2014-02-01 MED ORDER — THROMBIN 5000 UNITS EX SOLR
CUTANEOUS | Status: DC | PRN
  Administered 2014-02-01: 11:00:00 via TOPICAL

## 2014-02-01 MED ORDER — ALBUMIN HUMAN 5 % IV SOLN
INTRAVENOUS | Status: DC | PRN
  Administered 2014-02-01: 15:00:00 via INTRAVENOUS

## 2014-02-01 MED ORDER — LIDOCAINE HCL (CARDIAC) 20 MG/ML IV SOLN
INTRAVENOUS | Status: AC
  Filled 2014-02-01: qty 5

## 2014-02-01 MED ORDER — NEOSTIGMINE METHYLSULFATE 10 MG/10ML IV SOLN
INTRAVENOUS | Status: AC
  Filled 2014-02-01: qty 1

## 2014-02-01 SURGICAL SUPPLY — 83 items
ADH SKN CLS APL DERMABOND .7 (GAUZE/BANDAGES/DRESSINGS) ×1
APL SKNCLS STERI-STRIP NONHPOA (GAUZE/BANDAGES/DRESSINGS)
BAG DECANTER FOR FLEXI CONT (MISCELLANEOUS) ×3 IMPLANT
BENZOIN TINCTURE PRP APPL 2/3 (GAUZE/BANDAGES/DRESSINGS) IMPLANT
BLADE SURG 11 STRL SS (BLADE) ×3 IMPLANT
BLADE SURG ROTATE 9660 (MISCELLANEOUS) IMPLANT
BUR MATCHSTICK NEURO 3.0 LAGG (BURR) ×3 IMPLANT
CANISTER SUCT 3000ML (MISCELLANEOUS) ×3 IMPLANT
CATH FOLEY 2WAY SLVR  5CC 14FR (CATHETERS) ×2
CATH FOLEY 2WAY SLVR 5CC 14FR (CATHETERS) ×1 IMPLANT
CLOSURE WOUND 1/2 X4 (GAUZE/BANDAGES/DRESSINGS)
CONT SPEC 4OZ CLIKSEAL STRL BL (MISCELLANEOUS) ×7 IMPLANT
COVER BACK TABLE 24X17X13 BIG (DRAPES) IMPLANT
COVER TABLE BACK 60X90 (DRAPES) ×3 IMPLANT
DECANTER SPIKE VIAL GLASS SM (MISCELLANEOUS) ×3 IMPLANT
DERMABOND ADVANCED (GAUZE/BANDAGES/DRESSINGS) ×2
DERMABOND ADVANCED .7 DNX12 (GAUZE/BANDAGES/DRESSINGS) IMPLANT
DRAPE C-ARM 42X72 X-RAY (DRAPES) ×6 IMPLANT
DRAPE C-ARMOR (DRAPES) ×3 IMPLANT
DRAPE LAPAROTOMY 100X72X124 (DRAPES) ×3 IMPLANT
DRAPE MICROSCOPE LEICA (MISCELLANEOUS) ×3 IMPLANT
DRAPE POUCH INSTRU U-SHP 10X18 (DRAPES) ×3 IMPLANT
DRAPE SURG 17X23 STRL (DRAPES) ×3 IMPLANT
DRILL BIT (BIT) ×2 IMPLANT
DRSG OPSITE POSTOP 4X6 (GAUZE/BANDAGES/DRESSINGS) ×2 IMPLANT
DURAPREP 26ML APPLICATOR (WOUND CARE) ×3 IMPLANT
ELECT REM PT RETURN 9FT ADLT (ELECTROSURGICAL) ×3
ELECTRODE REM PT RTRN 9FT ADLT (ELECTROSURGICAL) ×1 IMPLANT
GAUZE SPONGE 4X4 12PLY STRL (GAUZE/BANDAGES/DRESSINGS) IMPLANT
GAUZE SPONGE 4X4 16PLY XRAY LF (GAUZE/BANDAGES/DRESSINGS) IMPLANT
GLOVE BIOGEL PI IND STRL 7.5 (GLOVE) ×1 IMPLANT
GLOVE BIOGEL PI INDICATOR 7.5 (GLOVE) ×8
GLOVE ECLIPSE 7.0 STRL STRAW (GLOVE) ×5 IMPLANT
GLOVE ECLIPSE 7.5 STRL STRAW (GLOVE) ×4 IMPLANT
GLOVE ECLIPSE 8.0 STRL XLNG CF (GLOVE) ×2 IMPLANT
GLOVE EXAM NITRILE LRG STRL (GLOVE) ×2 IMPLANT
GLOVE EXAM NITRILE MD LF STRL (GLOVE) IMPLANT
GLOVE EXAM NITRILE XL STR (GLOVE) IMPLANT
GLOVE EXAM NITRILE XS STR PU (GLOVE) IMPLANT
GLOVE INDICATOR 7.5 STRL GRN (GLOVE) ×2 IMPLANT
GLOVE SURG SS PI 7.0 STRL IVOR (GLOVE) ×6 IMPLANT
GOWN STRL REUS W/ TWL LRG LVL3 (GOWN DISPOSABLE) ×2 IMPLANT
GOWN STRL REUS W/ TWL XL LVL3 (GOWN DISPOSABLE) IMPLANT
GOWN STRL REUS W/TWL 2XL LVL3 (GOWN DISPOSABLE) IMPLANT
GOWN STRL REUS W/TWL LRG LVL3 (GOWN DISPOSABLE) ×9
GOWN STRL REUS W/TWL XL LVL3 (GOWN DISPOSABLE) ×9
HEMOSTAT POWDER KIT SURGIFOAM (HEMOSTASIS) ×3 IMPLANT
KIT BASIN OR (CUSTOM PROCEDURE TRAY) ×3 IMPLANT
KIT ROOM TURNOVER OR (KITS) ×3 IMPLANT
LIGHT RETRACTOR (MISCELLANEOUS) ×2 IMPLANT
LIGHT SOURCE ×2 IMPLANT
MARKER SKIN DUAL TIP RULER LAB (MISCELLANEOUS) ×2 IMPLANT
MILL MEDIUM DISP (BLADE) ×4 IMPLANT
NDL HYPO 18GX1.5 BLUNT FILL (NEEDLE) IMPLANT
NDL HYPO 25X1 1.5 SAFETY (NEEDLE) ×1 IMPLANT
NDL SPNL 18GX3.5 QUINCKE PK (NEEDLE) IMPLANT
NEEDLE HYPO 18GX1.5 BLUNT FILL (NEEDLE) IMPLANT
NEEDLE HYPO 25X1 1.5 SAFETY (NEEDLE) ×3 IMPLANT
NEEDLE SPNL 18GX3.5 QUINCKE PK (NEEDLE) ×3 IMPLANT
NS IRRIG 1000ML POUR BTL (IV SOLUTION) ×3 IMPLANT
PACK LAMINECTOMY NEURO (CUSTOM PROCEDURE TRAY) ×3 IMPLANT
PAD ARMBOARD 7.5X6 YLW CONV (MISCELLANEOUS) ×9 IMPLANT
ROD SOLERA 60MM (Rod) ×4 IMPLANT
RUBBERBAND STERILE (MISCELLANEOUS) ×6 IMPLANT
SCINTILLANT SURGICAL LIGHT ×1 IMPLANT
SCREW MAS 4.5X35 (Screw) ×12 IMPLANT
SCREW SET SOLERA (Screw) ×18 IMPLANT
SCREW SET SOLERA TI (Screw) IMPLANT
SPACER CAPSTONE SPINE 10X22 (Spacer) ×8 IMPLANT
SPONGE LAP 4X18 X RAY DECT (DISPOSABLE) IMPLANT
SPONGE SURGIFOAM ABS GEL 100 (HEMOSTASIS) ×3 IMPLANT
STRIP CLOSURE SKIN 1/2X4 (GAUZE/BANDAGES/DRESSINGS) IMPLANT
SUT VIC AB 0 CT1 18XCR BRD8 (SUTURE) ×1 IMPLANT
SUT VIC AB 0 CT1 8-18 (SUTURE) ×6
SUT VIC AB 2-0 CT1 18 (SUTURE) ×3 IMPLANT
SUT VIC AB 3-0 FS2 27 (SUTURE) IMPLANT
SUT VICRYL 3-0 RB1 18 ABS (SUTURE) ×3 IMPLANT
SYR 20ML ECCENTRIC (SYRINGE) ×3 IMPLANT
SYR 3ML LL SCALE MARK (SYRINGE) IMPLANT
TOWEL OR 17X24 6PK STRL BLUE (TOWEL DISPOSABLE) ×3 IMPLANT
TOWEL OR 17X26 10 PK STRL BLUE (TOWEL DISPOSABLE) ×3 IMPLANT
TRAP SPECIMEN MUCOUS 40CC (MISCELLANEOUS) ×3 IMPLANT
WATER STERILE IRR 1000ML POUR (IV SOLUTION) ×3 IMPLANT

## 2014-02-01 NOTE — Anesthesia Preprocedure Evaluation (Signed)
Anesthesia Evaluation  Patient identified by MRN, date of birth, ID band Patient awake    Reviewed: Allergy & Precautions, H&P , NPO status , Patient's Chart, lab work & pertinent test results  Airway Mallampati: II  Neck ROM: full    Dental   Pulmonary sleep apnea ,          Cardiovascular hypertension, + CAD, + Past MI and + Cardiac Stents     Neuro/Psych    GI/Hepatic   Endo/Other  Morbid obesity  Renal/GU      Musculoskeletal  (+) Arthritis -,   Abdominal   Peds  Hematology  (+) anemia ,   Anesthesia Other Findings   Reproductive/Obstetrics                           Anesthesia Physical Anesthesia Plan  ASA: III  Anesthesia Plan: General   Post-op Pain Management:    Induction: Intravenous  Airway Management Planned: Oral ETT  Additional Equipment:   Intra-op Plan:   Post-operative Plan: Extubation in OR  Informed Consent: I have reviewed the patients History and Physical, chart, labs and discussed the procedure including the risks, benefits and alternatives for the proposed anesthesia with the patient or authorized representative who has indicated his/her understanding and acceptance.     Plan Discussed with: CRNA, Anesthesiologist and Surgeon  Anesthesia Plan Comments:         Anesthesia Quick Evaluation

## 2014-02-01 NOTE — H&P (Signed)
CC:  Back and leg pain  HPI: Isabella [name] is a 73 year old woman seen for initial consultation in the office.  She comes in with a primary complaint of progressively worsening back and left-sided hip and leg pain.  She says she has been having some issues with her back chronically for more than a year.  Bilaterally, she describes occasional "catches" in her back.  At some point in April of this year, she suffered a fall while at her daughter's house.  Since then she has been having very severe back and left hip pain.  The pain is worst when she is standing up or walking for any length of time.  When she does this, she also gets very sharp pain which travels down her leg, as well as a feeling of her entire left leg being asleep.  She says the pain is minimal when she is sitting down or lying down.  She presents today with her daughter, and says that because of this pain over the last several months, she has essentially been immobile at home, and has gained a significant amount of weight.  She does not have any bladder symptoms.  She does not have any pain in her right leg, but she does have some numbness in her right foot and ankle.  She has been given numerous prescriptions for this pain, however does not tolerate narcotics very well.  She is primarily just been taking Motrin.  She has undergone epidural steroid injections by Dr. Nelva Bush which did not provide any relief.  She was also initially given a Medrol Dosepak which did not provide any relief.   PMH: Past Medical History  Diagnosis Date  . Hypertension   . High cholesterol   . Coronary artery disease   . Myocardial infarct 03/24/2011    Dr. [name]  . Sleep apnea     uses cpap  . History of blood transfusion   . Lung nodule   . Arthritis   . Complication of anesthesia     Took a while to wake up with knee  . Fallen bladder   . Granulomatous lung disease     PSH: Past Surgical History  Procedure Laterality Date  . Joint replacement    .  Femoral artery exploration  03/25/2011    Procedure: FEMORAL ARTERY EXPLORATION;  Surgeon: Elam Dutch, MD;  Location: [facility] OR;  Service: Vascular;  Laterality: Right;  Evacuation of Hematoma   . Tonsillectomy and adenoidectomy    . Carpal tunnel release Bilateral 2004  . Cataract extraction    . Shoulder surgery  2010  . Coronary stent placement  03/24/2011    Dr. [name]  . Cardiac catheterization  03/2011  . Total knee arthroplasty  07/2010    Left  . Laser surgery left eye  2012  . Umbilical hernia repair  1990, 2007    x 2  . Knee arthroscopy  2006    Right  . Video bronchoscopy with endobronchial navigation  04/13/2012    Procedure: VIDEO BRONCHOSCOPY WITH ENDOBRONCHIAL NAVIGATION;  Surgeon: Melrose Nakayama, MD;  Location: [facility];  Service: Thoracic;  Laterality: N/A;  . Hernia repair  6387    umbilical  . Coronary angioplasty  2012  . Hematoma surgery      from cardiac cath -hematoma in groin-right  . Total knee arthroplasty  06/17/2012    Procedure: TOTAL KNEE ARTHROPLASTY;  Surgeon: Gearlean Alf, MD;  Location: [facility];  Service: Orthopedics;  Laterality:  Right;  . Tonsillectomy  1949  . Appendectomy  1970  . Cholecystectomy  1970  . Tubal ligation  1978  . Abdominal hysterectomy  1990  . Eye surgery  [phone]    corneal transplant    SH: History  Substance Use Topics  . Smoking status: Never Smoker   . Smokeless tobacco: Never Used  . Alcohol Use: No    MEDS: Prior to Admission medications   Medication Sig Start Date End Date Taking? Authorizing Provider  aspirin 81 MG tablet Take 81 mg by mouth daily.   Yes Historical Provider, MD  atorvastatin (LIPITOR) 40 MG tablet Take 40 mg by mouth at bedtime.   Yes Historical Provider, MD  chlorpheniramine (CHLOR-TRIMETON) 4 MG tablet Take 8 mg by mouth at bedtime.   Yes Historical Provider, MD  furosemide (LASIX) 40 MG tablet Take 40 mg by mouth daily before breakfast.    Yes Historical Provider, MD  metoprolol  tartrate (LOPRESSOR) 25 MG tablet Take 25 mg by mouth 2 (two) times daily.   Yes Historical Provider, MD  nitroGLYCERIN (NITROSTAT) 0.4 MG SL tablet Place 1 tablet (0.4 mg total) under the tongue as needed. For chest pain 10/21/13  Yes Belva Crome III, MD  valsartan (DIOVAN) 160 MG tablet Take 160 mg by mouth daily.   Yes Historical Provider, MD    ALLERGY: Allergies  Allergen Reactions  . Tramadol Shortness Of Breath and Other (See Comments)    Per patient "felt like lungs were being squeezed"  . Codeine     Back hurts  . Erythromycin Diarrhea  . Hydromorphone     Severe Lethargy  . Neomycin Swelling  . Penicillins Hives  . Percocet [Oxycodone-Acetaminophen]     confusion  . Zestril [Lisinopril] Cough    ROS: Review of Systems  Constitutional: Negative for fever.  Eyes: Negative for blurred vision.  Respiratory: Negative for cough.   Cardiovascular: Negative for chest pain.  Gastrointestinal: Negative for heartburn.  Genitourinary: Negative for dysuria.  Musculoskeletal: Positive for back pain.  Skin: Negative for rash.  Neurological: Negative for dizziness and headaches.  Endo/Heme/Allergies: Does not bruise/bleed easily.  Psychiatric/Behavioral: Negative for depression.    NEUROLOGIC EXAM: Awake, alert, oriented Memory and concentration grossly intact Speech fluent, appropriate CN grossly intact Motor exam: Upper Extremities Deltoid Bicep Tricep Grip  Right 5/5 5/5 5/5 5/5  Left 5/5 5/5 5/5 5/5   Lower Extremity IP Quad PF DF EHL  Right 5/5 5/5 5/5 5/5 5/5  Left 5/5 5/5 5/5 5/5 5/5   Sensation grossly intact to LT  IMGAING: MRI of the lumbar spine was reviewed which demonstrates a large synovial cyst arising from the left L3 L4 facet causing severe spinal stenosis at this level.  At L4 L5 there is a grade 2 anterolisthesis of L4 on L5, with moderate central stenosis and bilateral foraminal stenosis at this level.  There is market facet arthropathy  bilaterally.  IMPRESSION: 73 year old woman with progressively worsening symptoms of neurogenic claudication and stenosis due to a large left-sided L3 L4 synovial cyst as well as a grade 2 spondylolisthesis at L4 L5.  PLAN: Surgical decompression and stabilization via L3 L4 and L4 L5 PLIF  I reviewed the MRI findings with the patient and her daughter in the office, explaining to them that her symptoms are likely related to the stenosis from the synovial cyst as well as from the spondylolisthesis.  Given her progressively worsening symptoms I did recommend surgical decompression and stabilization.  I explained to the patient the details of the procedure, as well as the risks which include but are not limited to nerve root injury leading to leg or foot weakness and or bowel and bladder dysfunction, CSF leak, bleeding, and infection.  Possible outcomes of surgery were also discussed including the possibility of uncomplicated surgery but persistence of pain symptoms and the possiblity of accelerated adjacent level degeneration. The general risks of anesthesia were also reviewed including heart attack, stroke, and DVT/PE.    The patient understood our discussion and is willing to proceed with surgical decompression and fusion.  All her questions were answered.

## 2014-02-01 NOTE — Transfer of Care (Signed)
Immediate Anesthesia Transfer of Care Note  Patient: Isabella [name] [facility]  Procedure(s) Performed: Procedure(s) with comments: POSTERIOR LUMBAR INTERBODYFUSION LUMBAR THREE-FOUR,LUMBAR FOUR-FIVE POSTERIOR LATERAL ARTHRODESIS AND POSTERIOR SEGMENTAL INSTRUMENTATION WITH REMOVAL SYNOVIAL CYST LUMBAR THREE-FOUR  (N/A) - POSTERIOR LUMBAR INTERBODYFUSION LUMBAR THREE-FOUR,LUMBAR FOUR-FIVE POSTERIOR LATERAL ARTHRODESIS AND POSTERIOR SEGMENTAL INSTRUMENTATION WITH REMOVAL SYNOVIAL CYST LUMBAR THREE-FOUR   Patient Location: PACU  Anesthesia Type:General  Level of Consciousness: lethargic and responds to stimulation  Airway & Oxygen Therapy: Patient Spontanous Breathing and Patient connected to nasal cannula oxygen  Post-op Assessment: Report given to PACU RN and Patient moving all extremities X 4  Post vital signs: Reviewed and stable  Complications: No apparent anesthesia complications

## 2014-02-01 NOTE — Op Note (Addendum)
PREOP DIAGNOSIS:  1. 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 Spondylolisthesis, L4-5 with spinal stenosis  POSTOP DIAGNOSIS: Same  PROCEDURE: 1. L3 laminectomy with radical facetectomy for decompression of exiting nerve roots, more than would be required for placement of interbody graft 2. L4 laminectomy with radical facetectomy for decompression of exiting nerve roots, more than would be required for placement of interbody graft 3. Resection of synovial cyst 4. Placement of anterior interbody device:  L3-4: 52m x 242m(x2) Medtronic PEEK cage  L4-5: 1026m 32m79m2) Medtronic PEEK cage 5. Posterior segmental instrumentation using cortical pedicle screws at L3, L4, L5 (4.5mm 95m5mm 56m 6. Interbody arthrodesis, L3-4, L4-5 7. Reduction of Spondylolisthesis, L4-5 8. Use of locally harvested bone autograft  SURGEON: Dr. NeelesConsuella LoseASSISTANT: Dr. Randy Karie ChimeraANESTHESIA: General Endotracheal  EBL: 250cc  SPECIMENS: L3-4 synovial cyst  DRAINS: None  COMPLICATIONS: None immediate  CONDITION: Hemodynamically stable to PACU  HISTORY: Isabella STORTI73 y.o46female who initially presented to the outpatient neurosurgery clinic with back and leg pain. MRI demonstrated a large synovial cyst at L3-4 with severe spinal stenosis, as well as a grade 1 spondylolisthesis at L4-5 with stenosis and lateral recess stenosis at this level. With these findings, surgical decompression and stabilization was indicated. The risks and benefits of the surgery were explained in detail to the patient and her family. After all their questions were answered, verbal and written consent was obtained and placed in the chart.  PROCEDURE IN DETAIL: After informed consent was obtained and witnessed, the patient was brought to the operating room. After induction of general anesthesia, the patient was positioned on the operative table in the prone position. All pressure points were meticulously  padded.  After timeout was conducted, skin was infiltrated with local anesthetic. Finder needle was introduced and lateral fluoroscopy demonstrated position at the L3 level. Skin incision was then made sharply and Bovie electrocautery was used to dissect the subcutaneous tissue until the lumbodorsal fascia was identified and incised. The muscle was then elevated in the subperiosteal plane. Self-retaining retractors were then placed after exposure of the L3 and L4 lamina out to the pars interarticularis as was the L3-4, L4-5 facet complex.  Using intraoperative fluoroscopy, entry points for bilateral L3 cortical pedicle screws were identified. Pilot holes were then created under lateral fluoroscopy and tapped to 35 mm.  At this point attention was turned to decompression. Complete L3 and L4 laminectomy with complete bilateral facetectomy was completed using a combination of Kerrison rongeurs and a high-speed drill. A very large synovial cyst was identified arising from the left L3-4 facet joint. This was severely compressing the thecal sac at this level. It appeared to contain some old hemorrhage. This was slowly dissected from the dura, and was sent for permanent pathology. Bilateral exiting L3 and L4 nerve roots were identified and completely decompressed out to the foramen.  The L3-4 and L4-5 disc spaces were then identified. The L3-4 space was incised bilaterally, and using a combination of curettes, shavers, and rasps, a radical discectomy at this level was completed. In a similar fashion, the L4-5 disc space was incised, and discectomy completed. Utilizing the locally harvested bone autograft during the decompression, the disc spaces were packed. A 10 mm x 22 mm trial was then tapped into place, and confirmed to be of the right size under lateral fluoroscopy. 2 of these cages were then filled with bone graft and tapped into  the L3-4 interspace. Similarly, 2 more cages were filled with autograft and  tapped into the L4-5 interspace.  At this point, under lateral fluoroscopy, cortical pedicle screw pilot holes were created at L4 and L5, and tapped to 35 mm x 4.5 mm. The screws were then placed at L4 and L5. A 60 mm rod was then selected and placed. The setscrews were then placed at all levels, and tightened at the L3 and L5 levels. The L4 level was then reduced and setscrews placed. All screws are then final tightened. Final AP and lateral fluoroscopic images demonstrated good position of the hardware, and near complete reduction of the L4-5 spondylolisthesis.  The wound was then irrigated with a copious amount of antibiotic saline. The wound was then closed in standard fashion using a combination of interrupted 0 and 3-0 Vicryl stitches and the muscular, fascial, and subcutaneous layers. Skin was then closed using standard Dermabond. Sterile dressing was then applied. The patient was then transferred to the stretcher, extubated, and taken to the postanesthesia care unit in stable hemodynamic condition.  At the end of the case all sponge, needle, cottonoid, and instrument counts were correct.

## 2014-02-02 DIAGNOSIS — Z9861 Coronary angioplasty status: Secondary | ICD-10-CM | POA: Diagnosis not present

## 2014-02-02 DIAGNOSIS — N3289 Other specified disorders of bladder: Secondary | ICD-10-CM | POA: Diagnosis not present

## 2014-02-02 DIAGNOSIS — J8410 Pulmonary fibrosis, unspecified: Secondary | ICD-10-CM | POA: Diagnosis present

## 2014-02-02 DIAGNOSIS — Z947 Corneal transplant status: Secondary | ICD-10-CM | POA: Diagnosis not present

## 2014-02-02 DIAGNOSIS — Z79899 Other long term (current) drug therapy: Secondary | ICD-10-CM | POA: Diagnosis not present

## 2014-02-02 DIAGNOSIS — I2510 Atherosclerotic heart disease of native coronary artery without angina pectoris: Secondary | ICD-10-CM | POA: Diagnosis present

## 2014-02-02 DIAGNOSIS — Z7982 Long term (current) use of aspirin: Secondary | ICD-10-CM | POA: Diagnosis not present

## 2014-02-02 DIAGNOSIS — M48062 Spinal stenosis, lumbar region with neurogenic claudication: Secondary | ICD-10-CM | POA: Diagnosis present

## 2014-02-02 DIAGNOSIS — E7800 Pure hypercholesterolemia, unspecified: Secondary | ICD-10-CM | POA: Diagnosis present

## 2014-02-02 DIAGNOSIS — G4730 Sleep apnea, unspecified: Secondary | ICD-10-CM | POA: Diagnosis present

## 2014-02-02 DIAGNOSIS — I10 Essential (primary) hypertension: Secondary | ICD-10-CM | POA: Diagnosis present

## 2014-02-02 DIAGNOSIS — Z96659 Presence of unspecified artificial knee joint: Secondary | ICD-10-CM | POA: Diagnosis not present

## 2014-02-02 DIAGNOSIS — M4310 Spondylolisthesis, site unspecified: Secondary | ICD-10-CM | POA: Diagnosis present

## 2014-02-02 DIAGNOSIS — M549 Dorsalgia, unspecified: Secondary | ICD-10-CM | POA: Diagnosis present

## 2014-02-02 DIAGNOSIS — I252 Old myocardial infarction: Secondary | ICD-10-CM | POA: Diagnosis not present

## 2014-02-02 DIAGNOSIS — Z6841 Body Mass Index (BMI) 40.0 and over, adult: Secondary | ICD-10-CM | POA: Diagnosis not present

## 2014-02-02 DIAGNOSIS — M7130 Other bursal cyst, unspecified site: Secondary | ICD-10-CM | POA: Diagnosis present

## 2014-02-02 LAB — URINALYSIS, ROUTINE W REFLEX MICROSCOPIC
BILIRUBIN URINE: NEGATIVE
Glucose, UA: NEGATIVE mg/dL
KETONES UR: NEGATIVE mg/dL
Leukocytes, UA: NEGATIVE
NITRITE: NEGATIVE
Protein, ur: NEGATIVE mg/dL
Specific Gravity, Urine: 1.007 (ref 1.005–1.030)
UROBILINOGEN UA: 0.2 mg/dL (ref 0.0–1.0)
pH: 5 (ref 5.0–8.0)

## 2014-02-02 LAB — URINE MICROSCOPIC-ADD ON

## 2014-02-02 MED ORDER — DIPHENHYDRAMINE HCL 25 MG PO CAPS
25.0000 mg | ORAL_CAPSULE | Freq: Four times a day (QID) | ORAL | Status: DC | PRN
  Administered 2014-02-02: 25 mg via ORAL
  Filled 2014-02-02: qty 1

## 2014-02-02 MED ORDER — HYDROCORTISONE 1 % EX CREA
TOPICAL_CREAM | Freq: Three times a day (TID) | CUTANEOUS | Status: DC
  Administered 2014-02-02 – 2014-02-03 (×4): via TOPICAL
  Administered 2014-02-04: 1 via TOPICAL
  Administered 2014-02-04 (×2): via TOPICAL
  Administered 2014-02-05: 1 via TOPICAL
  Administered 2014-02-05 – 2014-02-06 (×3): via TOPICAL
  Administered 2014-02-06 (×2): 1 via TOPICAL
  Administered 2014-02-07: 10:00:00 via TOPICAL
  Filled 2014-02-02: qty 28

## 2014-02-02 NOTE — Progress Notes (Signed)
Utilization review completed.  

## 2014-02-02 NOTE — Evaluation (Signed)
Physical Therapy Evaluation Patient Details Name: Isabella [name] MRN: [id number] DOB: [birth year] Today's Date: 02/02/2014   History of Present Illness  s/p L3-L4 laminectomy; removal of cyst and L3-L5 anterior interbody device.   Clinical Impression  Patient is s/p above surgery resulting in the deficits listed below (see PT Problem List).  Patient will benefit from skilled PT to increase their independence and safety with mobility (while adhering to their precautions) to allow discharge home. Husband has stage 1 ALS and cannot provide incr physical (A). Pt will need to be supervision to mod I for safe return home.      Follow Up Recommendations Home health PT;Supervision/Assistance - 24 hour    Equipment Recommendations  None recommended by PT    Recommendations for Other Services OT consult     Precautions / Restrictions Precautions Precautions: Back;Fall Precaution Booklet Issued: Yes (comment) Precaution Comments: pt given handout and reviewed throughout session Restrictions Weight Bearing Restrictions: No      Mobility  Bed Mobility               General bed mobility comments: pt up in chair and returned to chair; verbally discussed log rolling technique  Transfers Overall transfer level: Needs assistance Equipment used: Rolling walker (2 wheeled) Transfers: Sit to/from Stand Sit to Stand: Min guard         General transfer comment: cues for hand placement and safety with transfers   Ambulation/Gait Ambulation/Gait assistance: Min guard Ambulation Distance (Feet): 16 Feet Assistive device: Rolling walker (2 wheeled) Gait Pattern/deviations: Step-through pattern;Decreased stride length;Shuffle;Wide base of support Gait velocity: very decr; guarded Gait velocity interpretation: Below normal speed for age/gender General Gait Details: pt very trembly with gt; min guard to steady and balance; pt very weak and fatigued wtih short distances   Stairs             Wheelchair Mobility    Modified Rankin (Stroke Patients Only)       Balance Overall balance assessment: Needs assistance;History of Falls         Standing balance support: During functional activity;Bilateral upper extremity supported Standing balance-Leahy Scale: Poor Standing balance comment: pt relies on RW for bil UE support                             Pertinent Vitals/Pain Pain Assessment: No/denies pain    Home Living Family/patient expects to be discharged to:: Private residence Living Arrangements: Spouse/significant other Available Help at Discharge: Family;Available 24 hours/day Type of Home: House Home Access: Stairs to enter Entrance Stairs-Rails: None Entrance Stairs-Number of Steps: 1 Home Layout: One level Home Equipment: Walker - 2 wheels;Bedside commode;Walker - 4 wheels Additional Comments: pt has walk in shower; daughter will be checking in on her; husband lives with her but has stage 1 ALS    Prior Function Level of Independence: Independent               Hand Dominance        Extremity/Trunk Assessment   Upper Extremity Assessment: Defer to OT evaluation           Lower Extremity Assessment: Generalized weakness      Cervical / Trunk Assessment: Normal  Communication   Communication: No difficulties  Cognition Arousal/Alertness: Awake/alert Behavior During Therapy: Anxious Overall Cognitive Status: Within Functional Limits for tasks assessed       Memory: Decreased recall of precautions  General Comments General comments (skin integrity, edema, etc.): reviewed back precautions throughout session    Exercises General Exercises - Lower Extremity Ankle Circles/Pumps: AROM;Both;10 reps;Seated Long Arc Quad: AROM;Both;10 reps;Seated      Assessment/Plan    PT Assessment Patient needs continued PT services  PT Diagnosis Abnormality of gait;Generalized weakness;Acute pain   PT  Problem List Decreased strength;Decreased activity tolerance;Decreased balance;Decreased mobility;Decreased knowledge of precautions;Decreased knowledge of use of DME;Pain;Obesity  PT Treatment Interventions DME instruction;Gait training;Stair training;Functional mobility training;Therapeutic activities;Therapeutic exercise;Balance training;Neuromuscular re-education;Patient/family education   PT Goals (Current goals can be found in the Care Plan section) Acute Rehab PT Goals Patient Stated Goal: to go home when im ready PT Goal Formulation: With patient Time For Goal Achievement: 02/06/14 Potential to Achieve Goals: Good    Frequency Min 5X/week   Barriers to discharge Decreased caregiver support husband has stage 1 ALS; daughter comes over daily    Co-evaluation               End of Session Equipment Utilized During Treatment: Gait belt Activity Tolerance: Patient tolerated treatment well Patient left: in chair;with call bell/phone within reach;with nursing/sitter in room Nurse Communication: Mobility status;Precautions    Functional Assessment Tool Used: clinical judgement Functional Limitation: Mobility: Walking and moving around Mobility: Walking and Moving Around Current Status [phone]): At least 1 percent but less than 20 percent impaired, limited or restricted Mobility: Walking and Moving Around Goal Status [phone]): At least 1 percent but less than 20 percent impaired, limited or restricted Mobility: Walking and Moving Around Discharge Status [phone]): 0 percent impaired, limited or restricted    Time: 0812-0833 PT Time Calculation (min): 21 min   Charges:   PT Evaluation $Initial PT Evaluation Tier I: 1 Procedure PT Treatments $Gait Training: 8-22 mins   PT G Codes:   Functional Assessment Tool Used: clinical judgement Functional Limitation: Mobility: Walking and moving around    [place], [place], [place]  [phone] 02/02/2014, 8:43 AM

## 2014-02-02 NOTE — Progress Notes (Signed)
No issues overnight. Pt able to ambulate with rolling walker this am. Reports no pain/numbness in legs when standing. Appropriate back pain well controlled.  EXAM:  BP 107/56  Pulse 61  Temp(Src) 98.4 F (36.9 C) (Oral)  Resp 18  Ht 5' 1"  (1.549 m)  Wt 104.781 kg (231 lb)  BMI 43.67 kg/m2  SpO2 97%  Awake, alert, oriented  Speech fluent, appropriate  CN grossly intact  5/5 BUE/BLE  Wound c/d/i  IMPRESSION:  73 y.o. female POD# 1 s/p L3-L5 PLIF, doing well  PLAN: - Cont to mobilize with PT/OT - Likely can go home in 1-2 days

## 2014-02-02 NOTE — Evaluation (Signed)
Occupational Therapy Evaluation Patient Details Name: Isabella [name] MRN: [id number] DOB: [birth year] Today's Date: 02/02/2014    History of Present Illness s/p L3-L4 laminectomy; removal of cyst and L3-L5 anterior interbody device.    Clinical Impression   Pt s/p above. Pt independent with ADLs, PTA. Feel pt will benefit from acute to increase independence prior to d/c.     Follow Up Recommendations  No OT follow up;Supervision - Intermittent    Equipment Recommendations  Tub/shower seat    Recommendations for Other Services       Precautions / Restrictions Precautions Precautions: Back;Fall Precaution Booklet Issued: Yes (comment) Precaution Comments: reviewed precautions with pt Restrictions Weight Bearing Restrictions: No      Mobility Bed Mobility               General bed mobility comments: not assessed  Transfers Overall transfer level: Needs assistance Equipment used: Rolling walker (2 wheeled) Transfers: Sit to/from Stand Sit to Stand: Min guard         General transfer comment: Min guard for safety.         ADL Overall ADL's : Needs assistance/impaired     Grooming: Wash/dry face;Oral care;Standing;Set up;Supervision/safety               Lower Body Dressing: Minimal assistance;Sit to/from stand;With adaptive equipment   Toilet Transfer: Min guard;Ambulation;RW (chair)           Functional mobility during ADLs: Min guard;Rolling walker General ADL Comments: Educated on AE for LB ADLs and pt practiced with reacher and sockaid. Educated on use of cup for teeth care and placement of grooming items to avoid breaking  precautions. Educated on positioning of pillows. Educated/demonstrated shower transfer technique and recommended someone being with her for this. Discussed safety-use of bag on walker, safe shoewear, rugs, sitting for most of LB ADLs. Discussed incorporating precautions into functional activities.     Vision                      Perception     Praxis      Pertinent Vitals/Pain Pain Assessment: 0-10 Pain Score:  (did not rate, c/o tightness in back) Pain Descriptors / Indicators: Tightness     Hand Dominance Right   Extremity/Trunk Assessment Upper Extremity Assessment Upper Extremity Assessment: Overall WFL for tasks assessed   Lower Extremity Assessment Lower Extremity Assessment: Defer to PT evaluation   Cervical / Trunk Assessment Cervical / Trunk Assessment: Normal   Communication Communication Communication: No difficulties   Cognition Arousal/Alertness: Awake/alert Behavior During Therapy: WFL for tasks assessed/performed Overall Cognitive Status: Within Functional Limits for tasks assessed       Memory: Decreased short-term memory-per pt report              General Comments          Shoulder Instructions      Home Living Family/patient expects to be discharged to:: Private residence Living Arrangements: Spouse/significant other Available Help at Discharge: Family;Available 24 hours/day Type of Home: House Home Access: Stairs to enter [employer] of Steps: 1 Entrance Stairs-Rails: None Home Layout: One level     Bathroom Shower/Tub: Walk-in shower         Home Equipment: Environmental consultant - 2 wheels;Bedside commode;Walker - 4 wheels;Grab bars - tub/shower   Additional Comments: pt has walk in shower; daughter will be checking in on her; husband lives with her but has stage 1 ALS      Prior  Functioning/Environment Level of Independence: Independent             OT Diagnosis: Acute pain   OT Problem List: Decreased strength;Decreased activity tolerance;Decreased range of motion;Decreased knowledge of use of DME or AE;Decreased knowledge of precautions;Pain   OT Treatment/Interventions: Self-care/ADL training;DME and/or AE instruction;Therapeutic activities;Patient/family education;Balance training;Cognitive remediation/compensation    OT  Goals(Current goals can be found in the care plan section) Acute Rehab OT Goals Patient Stated Goal: not stated OT Goal Formulation: With patient Time For Goal Achievement: 02/09/14 Potential to Achieve Goals: Good ADL Goals Pt Will Perform Lower Body Bathing: with modified independence;sit to/from stand;with adaptive equipment Pt Will Perform Lower Body Dressing: with modified independence;with adaptive equipment;sit to/from stand Pt Will Transfer to Toilet: with modified independence;ambulating (3 in 1 over commode) Pt Will Perform Toileting - Clothing Manipulation and hygiene: with modified independence;sit to/from stand Pt Will Perform Tub/Shower Transfer: Shower transfer;with supervision;ambulating;shower seat;rolling walker  OT Frequency: Min 2X/week   Barriers to D/C:            Co-evaluation              End of Session Equipment Utilized During Treatment: Gait belt;Rolling walker  Activity Tolerance: Patient tolerated treatment well (became shaky towards end of session) Patient left: in chair;with call bell/phone within reach   Time: 0920-0947 OT Time Calculation (min): 27 min Charges:  OT General Charges $OT Visit: 1 Procedure OT Evaluation $Initial OT Evaluation Tier I: 1 Procedure OT Treatments $Self Care/Home Management : 8-22 mins G-CodesBenito Mccreedy OTR/L [phone] 02/02/2014, 10:10 AM

## 2014-02-02 NOTE — Progress Notes (Addendum)
Patient complaining of face flushing, itchiness, and also abd distention, Dr. Christella Noa notified via phone paged at [phone].

## 2014-02-03 NOTE — Progress Notes (Signed)
Since last assessment patient has not had tremors, flushing in face is starting to resolve. Ambulated patient to end of hallway lateral to front nursing station and back to room. No complaints of discomfort nor distress. Will continue to monitor accordingly.

## 2014-02-03 NOTE — Progress Notes (Signed)
Occupational Therapy Treatment Patient Details Name: Isabella [name] MRN: [id number] DOB: Sep 29, 1940 Today's Date: 02/03/2014    History of present illness s/p L3-L4 laminectomy; removal of cyst and L3-L5 anterior interbody device.    OT comments  Pt seen today for practice with bed mobility and transfers. Pt is progressing well and demonstrates good adherence to back precautions. Pt will continue to benefit from acute OT to address independence with ADLs.    Follow Up Recommendations  No OT follow up;Supervision - Intermittent    Equipment Recommendations  Tub/shower seat    Recommendations for Other Services      Precautions / Restrictions Precautions Precautions: Back;Fall Precaution Comments: pt able to recall 2/3 precautions independently; reviewed handout  Restrictions Weight Bearing Restrictions: No       Mobility Bed Mobility Overal bed mobility: Needs Assistance Bed Mobility: Rolling;Sidelying to Sit Rolling: Supervision Sidelying to sit: Min guard     Sit to sidelying: Supervision General bed mobility comments: VC's for technique. Supervision/MG for safety. HOB flat, no use of bedrail.  Transfers Overall transfer level: Needs assistance Equipment used: Rolling walker (2 wheeled) Transfers: Sit to/from [facility] Sit to Stand: Min guard Stand pivot transfers: Min guard       General transfer comment: Min guard for safety.         ADL Overall ADL's : Needs assistance/impaired                         Toilet Transfer: Min guard;Ambulation;RW           Functional mobility during ADLs: Min guard;Rolling walker General ADL Comments: Encouraged pt to sit up for meals and pt was agreeable to OOB to sit in recliner for lunch. Pt with good recall of back precautions.                Cognition  Arousal/Alertness: Awake/Alert Behavior During Therapy: WFL for tasks assessed/performed Overall Cognitive Status: Within  Functional Limits for tasks assessed                                    Pertinent Vitals/ Pain       Pain Assessment: 0-10 Pain Score: 2  Pain Location: surgical site Pain Descriptors / Indicators: Tightness Pain Intervention(s): Repositioned;Limited activity within patient's tolerance;Monitored during session         Frequency Min 2X/week     Progress Toward Goals  OT Goals(current goals can now be found in the care plan section)  Progress towards OT goals: Progressing toward goals  Acute Rehab OT Goals Patient Stated Goal: to go home tomorrow OT Goal Formulation: With patient Time For Goal Achievement: 02/09/14 Potential to Achieve Goals: Good ADL Goals Pt Will Perform Lower Body Bathing: with modified independence;sit to/from stand;with adaptive equipment Pt Will Perform Lower Body Dressing: with modified independence;with adaptive equipment;sit to/from stand Pt Will Transfer to Toilet: with modified independence;ambulating Pt Will Perform Toileting - Clothing Manipulation and hygiene: with modified independence;sit to/from stand Pt Will Perform Tub/Shower Transfer: Shower transfer;with supervision;ambulating;shower seat;rolling walker  Plan Discharge plan remains appropriate       End of Session Equipment Utilized During Treatment: Rolling walker   Activity Tolerance Patient tolerated treatment well   Patient Left in chair;with call bell/phone within reach   Nurse Communication Other (comment) (pt sitting up in recliner)        Time: 7672-0947  OT Time Calculation (min): 15 min  Charges: OT General Charges $OT Visit: 1 Procedure OT Treatments $Therapeutic Activity: 8-22 mins  Juluis Rainier 02/03/2014, 12:26 PM  Secundino Ginger Lynetta Mare, OTR/L Occupational Therapist [phone] (pager)

## 2014-02-03 NOTE — Progress Notes (Signed)
Pt had episodes of tremor with facial flushing yesterday while she was on the bedside commode. This resolved early this am, and was walking with rolling walker to RN station.  EXAM:  BP 152/75  Pulse 71  Temp(Src) 97.7 F (36.5 C) (Oral)  Resp 20  Ht 5' 1"  (1.549 m)  Wt 104.781 kg (231 lb)  BMI 43.67 kg/m2  SpO2 100%  Awake, alert, oriented  Speech fluent, appropriate  CN grossly intact  5/5 BUE/BLE  Wound c/d/i  IMPRESSION:  73 y.o. female POD#2 s/p L3-5 PLIF, neurologically well Episode last night ?bladder spasm  PLAN: - Cont to mobilize as tolerated - Monitor for further episodes of flushing. May add anti-muscarinic if episodes continue while voiding.

## 2014-02-03 NOTE — Progress Notes (Signed)
Physical Therapy Treatment Patient Details Name: Isabella [name] MRN: [id number] DOB: 1940-12-23 Today's Date: 02/03/2014    History of Present Illness s/p L3-L4 laminectomy; removal of cyst and L3-L5 anterior interbody device.     PT Comments    Pt very motivated and pleasant to work with. Pt progressing well with mobility and (A) with bathroom/ADLs. Pt at min guard to supervision for all mobility at this time. Will cont to follow per POC.   Follow Up Recommendations  Home health PT;Supervision/Assistance - 24 hour     Equipment Recommendations  None recommended by PT    Recommendations for Other Services OT consult     Precautions / Restrictions Precautions Precautions: Back;Fall Precaution Comments: pt able to recall 2/3 precautions independently; reviewed handout  Restrictions Weight Bearing Restrictions: No    Mobility  Bed Mobility Overal bed mobility: Needs Assistance Bed Mobility: Rolling;Sit to Sidelying Rolling: Supervision       Sit to sidelying: Supervision General bed mobility comments: cues for log rolling technique   Transfers Overall transfer level: Needs assistance Equipment used: Rolling walker (2 wheeled) Transfers: Sit to/from Stand Sit to Stand: Min guard         General transfer comment: min guard to steady and balance; able to transfer from lower surface toilet with use of handicap railing   Ambulation/Gait Ambulation/Gait assistance: Min guard Ambulation Distance (Feet): 150 Feet Assistive device: Rolling walker (2 wheeled) Gait Pattern/deviations: Step-through pattern;Decreased stride length;Trunk flexed;Wide base of support Gait velocity: guarded due to pain Gait velocity interpretation: Below normal speed for age/gender General Gait Details: cues for upright posture and safety with RW   Stairs            Wheelchair Mobility    Modified Rankin (Stroke Patients Only)       Balance Overall balance assessment: Needs  assistance;History of Falls Sitting-balance support: Feet supported;No upper extremity supported Sitting balance-Leahy Scale: Good     Standing balance support: During functional activity;Bilateral upper extremity supported;Single extremity supported Standing balance-Leahy Scale: Poor Standing balance comment: relying on UE support at all times to balance when performing ADLs in bathroom; min guard for safety and balance also                    Cognition Arousal/Alertness: Awake/alert Behavior During Therapy: WFL for tasks assessed/performed Overall Cognitive Status: Within Functional Limits for tasks assessed                      Exercises General Exercises - Lower Extremity Ankle Circles/Pumps: AROM;Both;10 reps;Seated    General Comments General comments (skin integrity, edema, etc.): encouraged OOB for all activities       Pertinent Vitals/Pain Pain Assessment: 0-10 Pain Score: 2  Pain Location: surgical site Pain Descriptors / Indicators:  ("stiffness") Pain Intervention(s): Repositioned;Premedicated before session;Monitored during session    Home Living                      Prior Function            PT Goals (current goals can now be found in the care plan section) Acute Rehab PT Goals Patient Stated Goal: to go home tomorrow PT Goal Formulation: With patient Time For Goal Achievement: 02/06/14 Potential to Achieve Goals: Good Progress towards PT goals: Progressing toward goals    Frequency  Min 5X/week    PT Plan Current plan remains appropriate    Co-evaluation  End of Session Equipment Utilized During Treatment: Gait belt Activity Tolerance: Patient tolerated treatment well Patient left: in bed;with call bell/phone within reach     Time: 7225-7505 PT Time Calculation (min): 25 min  Charges:  [employer] Training: 23-37 mins                    G CodesIlena, [place], [place] 02/03/2014, 11:41  AM

## 2014-02-03 NOTE — Progress Notes (Addendum)
Patient still complaining of tremoring earlier that has been present since after surgery. She stated that it was typical of her to tremor after surgery from anesthesia. No SOB noted, VSS. Patient's face is still flushed. Will notify oncoming nurse to speak with physician in am.

## 2014-02-04 MED ORDER — METHOCARBAMOL 500 MG PO TABS
500.0000 mg | ORAL_TABLET | Freq: Four times a day (QID) | ORAL | Status: DC
  Administered 2014-02-04 – 2014-02-07 (×13): 500 mg via ORAL
  Filled 2014-02-04 (×13): qty 1

## 2014-02-04 MED ORDER — OXYBUTYNIN CHLORIDE 5 MG PO TABS
2.5000 mg | ORAL_TABLET | Freq: Two times a day (BID) | ORAL | Status: DC
  Administered 2014-02-04 – 2014-02-07 (×7): 2.5 mg via ORAL
  Filled 2014-02-04 (×7): qty 1

## 2014-02-04 NOTE — Anesthesia Postprocedure Evaluation (Signed)
Anesthesia Post Note  Patient: Isabella [name]  Procedure(s) Performed: Procedure(s) (LRB): POSTERIOR LUMBAR INTERBODYFUSION LUMBAR THREE-FOUR,LUMBAR FOUR-FIVE POSTERIOR LATERAL ARTHRODESIS AND POSTERIOR SEGMENTAL INSTRUMENTATION WITH REMOVAL SYNOVIAL CYST LUMBAR THREE-FOUR  (N/A)  Anesthesia type: General  Patient location: PACU  Post pain: Pain level controlled and Adequate analgesia  Post assessment: Post-op Vital signs reviewed, Patient's Cardiovascular Status Stable, Respiratory Function Stable, Patent Airway and Pain level controlled  Last Vitals:  Filed Vitals:   02/04/14 0620  BP: 157/67  Pulse: 71  Temp: 36.9 C  Resp: 20    Post vital signs: Reviewed and stable  Level of consciousness: awake, alert  and oriented  Complications: No apparent anesthesia complications

## 2014-02-04 NOTE — Progress Notes (Signed)
Orthopedic Tech Progress Note Patient Details:  [name] [facility] [birth year] [id number] Called in order to [employer]. Patient ID: Isabella [name], female   DOB: 1941/03/24, 73 y.o.   MRN: [id number]   Darrol Poke 02/04/2014, 2:42 PM

## 2014-02-04 NOTE — Progress Notes (Signed)
Physical Therapy Treatment Patient Details Name: Isabella [name] MRN: [id number] DOB: March 12, 1941 Today's Date: 02/04/2014    History of Present Illness s/p L3-L4 laminectomy; removal of cyst and L3-L5 anterior interbody device.     PT Comments    Pt greatly limited today due to incr in muscle spasms and pain in Rt hip/leg. Pt also requesting back brace for stability, will need MD orders for bracing. Pt not safe to D/c home at this time due to lack of mobility and incr (A) needed. Will cont to follow per POC.   Follow Up Recommendations  Home health PT;Supervision/Assistance - 24 hour     Equipment Recommendations  None recommended by PT    Recommendations for Other Services OT consult     Precautions / Restrictions Precautions Precautions: Back;Fall Precaution Comments: reviewed precautions with pt and daughter in law Required Braces or Orthoses: Other Brace/Splint Other Brace/Splint: pt requesting brace from MD - awaiting orders Restrictions Weight Bearing Restrictions: No    Mobility  Bed Mobility Overal bed mobility: Needs Assistance Bed Mobility: Sit to Sidelying;Rolling Rolling: Supervision       Sit to sidelying: Min guard General bed mobility comments: pt required min guard to return to supine position due to incr in pain; cues for log rolling technique   Transfers Overall transfer level: Needs assistance Equipment used: Rolling walker (2 wheeled) Transfers: Sit to/from Stand Sit to Stand: Min assist         General transfer comment: pt with incr difficulty today vs previous sessions with sit to stand; pt trembling and shaky due to cramp/pain in rt hip; cues for safety and hand placement  Ambulation/Gait Ambulation/Gait assistance: Min assist Ambulation Distance (Feet): 10 Feet Assistive device: Rolling walker (2 wheeled) Gait Pattern/deviations: Step-through pattern;Decreased stride length;Wide base of support;Trunk flexed Gait velocity: very guarded and  decr due to muscle spasms/pain Gait velocity interpretation: Below normal speed for age/gender General Gait Details: pt greatly limited today due to pain; min (A) to maintain balance and reach bed due to inability to progress mobility; cues for safety   Stairs Stairs:  (defer to next session due to incr pain)          Wheelchair Mobility    Modified Rankin (Stroke Patients Only)       Balance Overall balance assessment: Needs assistance;History of Falls Sitting-balance support: Feet supported;No upper extremity supported Sitting balance-Leahy Scale: Fair Sitting balance - Comments: guarded today due to pain   Standing balance support: During functional activity;Bilateral upper extremity supported Standing balance-Leahy Scale: Poor Standing balance comment: pt unsteady today and shaky due to pain                    Cognition Arousal/Alertness: Awake/alert Behavior During Therapy: WFL for tasks assessed/performed Overall Cognitive Status: Within Functional Limits for tasks assessed                      Exercises General Exercises - Lower Extremity Ankle Circles/Pumps: AROM;Both;10 reps;Seated    General Comments General comments (skin integrity, edema, etc.): spoke with pt and daughter in law at length regarding D/C recommendations; pt not safe at this time to D/C home      Pertinent Vitals/Pain Pain Assessment: Faces Faces Pain Scale: Hurts worst Pain Location: Rt hip and throughout LE Pain Descriptors / Indicators: Cramping;Throbbing;Sharp Pain Intervention(s): Repositioned;Patient requesting pain meds-RN notified;Other (comment);Limited activity within patient's tolerance;Monitored during session (requesting muscle relaxer )    Home Living  Prior Function            PT Goals (current goals can now be found in the care plan section) Acute Rehab PT Goals Patient Stated Goal: to not have this leg pain PT Goal  Formulation: With patient Time For Goal Achievement: 02/06/14 Potential to Achieve Goals: Good Progress towards PT goals: Progressing toward goals    Frequency  Min 5X/week    PT Plan Current plan remains appropriate    Co-evaluation             End of Session Equipment Utilized During Treatment: Gait belt Activity Tolerance: Patient limited by pain Patient left: in bed;with call bell/phone within reach;with bed alarm set;with family/visitor present;with nursing/sitter in room     Time: 0831-0847 PT Time Calculation (min): 16 min  Charges:  $Gait Training: 8-22 mins                    G Codes:  Functional Assessment Tool Used: clinical judgement Functional Limitation: Mobility: Walking and moving around Mobility: Walking and Moving Around Current Status [phone]): At least 1 percent but less than 20 percent impaired, limited or restricted Mobility: Walking and Moving Around Goal Status [phone]): 0 percent impaired, limited or restricted Mobility: Walking and Moving Around Discharge Status [phone]): At least 1 percent but less than 20 percent impaired, limited or restricted   [place], [place], [place] 02/04/2014, 11:37 AM

## 2014-02-04 NOTE — Anesthesia Postprocedure Evaluation (Deleted)
Anesthesia Post Note  Patient: Isabella [name]  Procedure(s) Performed: Procedure(s) (LRB): POSTERIOR LUMBAR INTERBODYFUSION LUMBAR THREE-FOUR,LUMBAR FOUR-FIVE POSTERIOR LATERAL ARTHRODESIS AND POSTERIOR SEGMENTAL INSTRUMENTATION WITH REMOVAL SYNOVIAL CYST LUMBAR THREE-FOUR  (N/A)  Anesthesia type: General  Patient location: PACU  Post pain: Pain level controlled and Adequate analgesia  Post assessment: Post-op Vital signs reviewed, Patient's Cardiovascular Status Stable, Respiratory Function Stable, Patent Airway and Pain level controlled  Last Vitals:  Filed Vitals:   02/04/14 0620  BP: 157/67  Pulse: 71  Temp: 36.9 C  Resp: 20    Post vital signs: Reviewed and stable  Level of consciousness: awake, alert  and oriented  Complications: No apparent anesthesia complications

## 2014-02-04 NOTE — Progress Notes (Signed)
No issues overnight. This am pt c/o significant muscle spasms and pain inhibiting ambulation. Also cont to c/o urinary frequency/urgency but no dysuria.  EXAM:  BP 145/79  Pulse 68  Temp(Src) 98.4 F (36.9 C) (Oral)  Resp 20  Ht 5' 1"  (1.549 m)  Wt 104.781 kg (231 lb)  BMI 43.67 kg/m2  SpO2 97%  Awake, alert, oriented  Speech fluent, appropriate  CN grossly intact  5/5 BUE/BLE  Wound c/d/i  IMPRESSION:  73 y.o. female POD#3 s/p L3-5 PLIF, neurologically at baseline with increased muscle spasms - Frequency/Urgency without dysuria and negative UA - ?bladder spasms  PLAN: - Add Robaxin scheduled - will add oxybutinin 2.39m BID

## 2014-02-05 NOTE — Progress Notes (Signed)
Physical Therapy Treatment Patient Details Name: Isabella [name] MRN: [id number] DOB: 1940/07/16 Today's Date: 2014-02-13    History of Present Illness s/p L3-L4 laminectomy; removal of cyst and L3-L5 anterior interbody device.     PT Comments    Pt making great progress with mobility. Stair education completed today. Safe for discharge home from mobility standpoint.  Follow Up Recommendations  Home health PT;Supervision/Assistance - 24 hour     Equipment Recommendations  None recommended by PT    Recommendations for Other Services OT consult     Precautions / Restrictions Precautions Precautions: Back;Fall Precaution Comments: reviewed precautions with pt Required Braces or Orthoses: Other Brace/Splint Other Brace/Splint: pt with brace donned on arrival Restrictions Weight Bearing Restrictions: No    Mobility  Bed Mobility               General bed mobility comments: not assessed- pt in chair before and after session  Transfers Overall transfer level: Needs assistance Equipment used: Rolling walker (2 wheeled) Transfers: Sit to/from [facility] Sit to Stand: Min guard Stand pivot transfers: Min guard       General transfer comment: cues on hand placment with sit/stand transfers and on use of walker with stand pivot transfers.  Ambulation/Gait Ambulation/Gait assistance: Min guard;Supervision Ambulation Distance (Feet): 400 Feet Assistive device: Rolling walker (2 wheeled) Gait Pattern/deviations: Step-through pattern;Decreased stride length Gait velocity: decreased Gait velocity interpretation: Below normal speed for age/gender General Gait Details: occasional cues on posture and walker position with gait.   Stairs Stairs: Yes Stairs assistance: Min guard Stair Management: Forwards;Two rails;Step to pattern Number of Stairs: 2 General stair comments: cues on sequence and technique  Wheelchair Mobility    Modified Rankin (Stroke  Patients Only)          Cognition Arousal/Alertness: Awake/alert Behavior During Therapy: WFL for tasks assessed/performed Overall Cognitive Status: Within Functional Limits for tasks assessed                       Pertinent Vitals/Pain Pain Assessment: 0-10 Pain Score: 3  Pain Location: back and right hip Pain Descriptors / Indicators: Aching;Sore Pain Intervention(s): Monitored during session;Premedicated before session;Repositioned     PT Goals (current goals can now be found in the care plan section) Acute Rehab PT Goals Patient Stated Goal: to not have this leg pain PT Goal Formulation: With patient Time For Goal Achievement: 02/06/14 Potential to Achieve Goals: Good Progress towards PT goals: Progressing toward goals    Frequency  Min 5X/week    PT Plan Current plan remains appropriate       End of Session Equipment Utilized During Treatment: Gait belt;Back brace Activity Tolerance: Patient tolerated treatment well Patient left: in chair;with call bell/phone within reach     Time: 1143-1210 PT Time Calculation (min): 27 min  Charges:  $Gait Training: 23-37 mins                    G Codes:      Willow Ora 02-13-2014, 1:43 PM  Willow Ora, PTA Office- [phone]

## 2014-02-05 NOTE — Progress Notes (Signed)
Subjective: Patient reports she's doing much better significant room in her pain but she still is not ambulated in the halls significantly to feel comfortable going home.  Objective: Vital signs in last 24 hours: Temp:  [97.8 F (36.6 C)-98.8 F (37.1 C)] 98.1 F (36.7 C) (09/26 0700) Pulse Rate:  [64-77] 74 (09/26 0700) Resp:  [20] 20 (09/26 0700) BP: (139-152)/(57-79) 145/74 mmHg (09/26 0700) SpO2:  [97 %-100 %] 99 % (09/26 0700)  Intake/Output from previous day:   Intake/Output this shift:    strength out of 5 wound clean dry and intact  Lab Results: No results found for this basename: WBC, HGB, HCT, PLT,  in the last 72 hours BMET No results found for this basename: NA, K, CL, CO2, GLUCOSE, BUN, CREATININE, CALCIUM,  in the last 72 hours  Studies/Results: No results found.  Assessment/Plan: Continue to progress to mobilize with physical therapy probable discharge tomorrow   LOS: 4 days     Isabella [name] P 02/05/2014, 9:19 AM

## 2014-02-06 NOTE — Progress Notes (Signed)
CPAP set up for patient self-administration.  Auto titration mode (min4, max 20) and room air used.  Patient is familiar with equipment and procedure, as patient wears CPAP at home.  Patient advised to notify RN or RT with any issues.

## 2014-02-06 NOTE — Progress Notes (Signed)
Occupational Therapy Treatment Patient Details Name: Isabella [name] MRN: [id number] DOB: 30-Jan-1941 Today's Date: 02/06/2014    History of present illness s/p L3-L4 laminectomy; removal of cyst and L3-L5 anterior interbody device.    OT comments  Education provided. Pt performed ADLs and wanted to ambulate.   Follow Up Recommendations  No OT follow up;Supervision - Intermittent    Equipment Recommendations  Tub/shower seat    Recommendations for Other Services      Precautions / Restrictions Precautions Precautions: Back;Fall Precaution Comments: reviewed precautions with pt Required Braces or Orthoses: Spinal Brace Spinal Brace: Lumbar corset Restrictions Weight Bearing Restrictions: No       Mobility Bed Mobility Overal bed mobility: Needs Assistance Bed Mobility: Rolling;Sit to Sidelying Rolling: Supervision       Sit to sidelying: Supervision General bed mobility comments: cues to try to prevent twisting when rolling.  Transfers Overall transfer level: Needs assistance Equipment used: Rolling walker (2 wheeled) Transfers: Sit to/from [facility] Sit to Stand: Supervision;Min guard Stand pivot transfers: Min guard            Balance                                   ADL Overall ADL's : Needs assistance/impaired                     Lower Body Dressing: Moderate assistance;Sit to/from stand;With adaptive equipment (grip socks hindrance)   Toilet Transfer: Min guard;Ambulation;RW;BSC   Toileting- Clothing Manipulation and Hygiene: Sit to/from stand;Minimal assistance (panties small size)       Functional mobility during ADLs: Min guard;Rolling walker General ADL Comments: Discussed to use bag on walker. Practiced donning panties with reacher. OT placed pads in underwear to help with incontinence. Educated on back brace.       Vision                     Perception     Praxis      Cognition    Behavior During Therapy: WFL for tasks assessed/performed Overall Cognitive Status: Within Functional Limits for tasks assessed                       Extremity/Trunk Assessment               Exercises     Shoulder Instructions       General Comments      Pertinent Vitals/ Pain       Pain Assessment: 0-10 Pain Score: 3  Pain Location: back Pain Descriptors / Indicators: Tightness Pain Intervention(s): Monitored during session;Repositioned  Home Living                                          Prior Functioning/Environment              Frequency Min 2X/week     Progress Toward Goals  OT Goals(current goals can now be found in the care plan section)  Progress towards OT goals: Progressing toward goals  Acute Rehab OT Goals Patient Stated Goal: not stated OT Goal Formulation: With patient Time For Goal Achievement: 02/09/14 Potential to Achieve Goals: Good ADL Goals Pt Will Perform Lower Body Bathing: with modified independence;sit to/from stand;with adaptive equipment Pt  Will Perform Lower Body Dressing: with modified independence;with adaptive equipment;sit to/from stand Pt Will Transfer to Toilet: with modified independence;ambulating Pt Will Perform Toileting - Clothing Manipulation and hygiene: with modified independence;sit to/from stand Pt Will Perform Tub/Shower Transfer: Shower transfer;with supervision;ambulating;shower seat;rolling walker  Plan Discharge plan remains appropriate    Co-evaluation                 End of Session Equipment Utilized During Treatment: Gait belt;Rolling walker;Back brace   Activity Tolerance Patient tolerated treatment well   Patient Left with call bell/phone within reach;in bed;with bed alarm set   Nurse Communication          Time: 1434-1450 OT Time Calculation (min): 16 min  Charges: OT General Charges $OT Visit: 1 Procedure OT Treatments $Self Care/Home Management :  8-22 mins  Benito Mccreedy OTR/L [phone] 02/06/2014, 3:01 PM

## 2014-02-06 NOTE — Progress Notes (Signed)
Patient ID: Isabella [name], female   DOB: 05-06-1941, 73 y.o.   MRN: [id number] Vital signs are stable. Patient has been ambulating Has not been cleared on stairs yet Daughter is in room and does not feel she is ready for discharge yet Continue PT and mobilization.

## 2014-02-07 MED ORDER — METHOCARBAMOL 500 MG PO TABS: 500.0000 mg | ORAL_TABLET | Freq: Four times a day (QID) | ORAL | Status: DC

## 2014-02-07 MED ORDER — HYDROCODONE-ACETAMINOPHEN 10-325 MG PO TABS: 1.0000 | ORAL_TABLET | ORAL | Status: DC | PRN

## 2014-02-07 MED ORDER — ASPIRIN 81 MG PO TABS: 81.0000 mg | ORAL_TABLET | Freq: Every day | ORAL | Status: DC

## 2014-02-07 MED ORDER — DIAZEPAM 5 MG PO TABS: 5.0000 mg | ORAL_TABLET | Freq: Four times a day (QID) | ORAL | Status: DC | PRN

## 2014-02-07 MED ORDER — OXYBUTYNIN CHLORIDE 5 MG PO TABS: 2.5000 mg | ORAL_TABLET | Freq: Two times a day (BID) | ORAL | Status: DC

## 2014-02-07 NOTE — Progress Notes (Signed)
Talked to patient about [facility]/ [place] choices, patient chose [facility] for HHPT, [facility] called for arrangements; Carnella [place], [place] [phone]

## 2014-02-07 NOTE — Progress Notes (Signed)
PT Cancellation Note  Patient Details Name: Isabella [name] MRN: [id number] DOB: [birth year]   Cancelled Treatment:    Reason Eval/Treat Not Completed: Other (comment).  Pt reports she is d/c home today, she has been mobilizing well and has no questions of PT.  She doesn't feel she needs to practice anything more before going home.     Barbarann Ehlers [place], [place], DPT [phone]   02/07/2014, 4:08 PM

## 2014-02-07 NOTE — Progress Notes (Signed)
Patient is discharged from room 4N29 at this time. Alert and in stable condition. Instructions read to patient and understanding verbalized. IV site d/c'd. Left unit via wheelchair with belongings and daughter at side.

## 2014-02-07 NOTE — Discharge Summary (Signed)
Physician Discharge Summary  Patient ID: Isabella [name] MRN: [id number] DOB/AGE: August 04, 1940 73 y.o.  Admit date: 02/01/2014 Discharge date: 02/07/2014  Admission Diagnoses:  1. Spinal stenosis with spondylolisthesis 2. Synovial cyst  Discharge Diagnoses: Same Active Problems:   Degenerative spondylolisthesis   Discharged Condition: Stable  Hospital Course:  [name] is a 73 y.o. female electively admitted after L3-L5 PLIF. She had a largely unremarkable hospital course, with slow but steady improvement in ambulation and pain control. She did experience urinary frequency/urgency but did not have evidence of UTI and thought to be bladder spasm. She was placed on Ditropan. She was otherwise ambulatory, tolerating diet, and with pain under control at the time of discharge.  Treatments: Surgery - L3-L5 PLIF  Discharge Exam: Blood pressure 141/79, pulse 65, temperature 97.3 F (36.3 C), temperature source Oral, resp. rate 18, height 5' 1"  (1.549 m), weight 104.781 kg (231 lb), SpO2 95.00%. Awake, alert, oriented Speech fluent, appropriate CN grossly intact 5/5 BUE/BLE Wound c/d/i  Follow-up: Follow-up in my office [facility] [phone]) in 2-3 weeks  Disposition: 06-Home-Health Care Svc  Discharge Instructions   Ambulatory referral to Home Health    Complete by:  As directed   Please evaluate Isabella [name] for admission to [facility].  Disciplines requested: Physical Therapy  Services to provide: Strengthening Exercises and Evaluate  Physician to follow patient's care (the person listed here will be responsible for signing ongoing orders): Other: Consuella Lose, MD  Requested Start of Care Date: Today (Please call ahead to confirm availability)  I certify that this patient is under my care and that I, or a Nurse Practitioner or Physician's Assistant working with me, had a face-to-face encounter that meets the physician face-to-face  requirements with patient on Sept 28, 2015. The encounter with the patient was in whole, or in part for the following medical condition(s) which is the primary reason for home health care (List medical condition). Spinal stenosis with spondylolisthesis, s/p lumbar fusion  The encounter with the patient was in whole, or in part, for the following medical condition, which is the primary reason for home health care:  spinal stenosis  I certify that, based on my findings, the following services are medically necessary home health services:  Physical therapy  My clinical findings support the need for the above services:  Pain interferes with ambulation/mobility  Further, I certify that my clinical findings support that this patient is homebound due to:  Pain interferes with ambulation/mobility  Reason for Medically Necessary Home Health Services:  Therapy- Therapeutic Exercises to Increase Strength and Endurance  Does the patient have Medicare or Medicaid?:  No            Medication List         aspirin 81 MG tablet  Take 1 tablet (81 mg total) by mouth daily.  Start taking on:  02/10/2014     chlorpheniramine 4 MG tablet  Commonly known as:  CHLOR-TRIMETON  Take 8 mg by mouth at bedtime.     diazepam 5 MG tablet  Commonly known as:  VALIUM  Take 1 tablet (5 mg total) by mouth every 6 (six) hours as needed for muscle spasms.     furosemide 40 MG tablet  Commonly known as:  LASIX  Take 40 mg by mouth daily before breakfast.     HYDROcodone-acetaminophen 10-325 MG per tablet  Commonly known as:  NORCO  Take 1-2 tablets by mouth every 4 (four) hours  as needed for moderate pain.     LIPITOR 40 MG tablet  Generic drug:  atorvastatin  Take 40 mg by mouth at bedtime.     methocarbamol 500 MG tablet  Commonly known as:  ROBAXIN  Take 1 tablet (500 mg total) by mouth 4 (four) times daily.     metoprolol tartrate 25 MG tablet  Commonly known as:  LOPRESSOR  Take 25 mg by mouth 2 (two)  times daily.     nitroGLYCERIN 0.4 MG SL tablet  Commonly known as:  NITROSTAT  Place 1 tablet (0.4 mg total) under the tongue as needed. For chest pain     oxybutynin 5 MG tablet  Commonly known as:  DITROPAN  Take 0.5 tablets (2.5 mg total) by mouth 2 (two) times daily.     valsartan 160 MG tablet  Commonly known as:  DIOVAN  Take 160 mg by mouth daily.         SignedConsuella Lose, C 02/07/2014, 8:56 AM

## 2014-03-12 ENCOUNTER — Encounter: Payer: Self-pay | Admitting: *Deleted

## 2014-03-29 ENCOUNTER — Ambulatory Visit (INDEPENDENT_AMBULATORY_CARE_PROVIDER_SITE_OTHER)
Admission: RE | Admit: 2014-03-29 | Discharge: 2014-03-29 | Disposition: A | Discharge status: Home / Self Care | Payer: Medicare Other | Source: Ambulatory Visit | Attending: Pulmonary Disease | Admitting: Pulmonary Disease

## 2014-03-29 DIAGNOSIS — R9389 Abnormal findings on diagnostic imaging of other specified body structures: Secondary | ICD-10-CM

## 2014-03-29 DIAGNOSIS — R938 Abnormal findings on diagnostic imaging of other specified body structures: Secondary | ICD-10-CM

## 2014-04-06 ENCOUNTER — Ambulatory Visit (INDEPENDENT_AMBULATORY_CARE_PROVIDER_SITE_OTHER): Payer: Medicare Other | Admitting: *Deleted

## 2014-04-06 ENCOUNTER — Ambulatory Visit (INDEPENDENT_AMBULATORY_CARE_PROVIDER_SITE_OTHER): Payer: Medicare Other | Admitting: Interventional Cardiology

## 2014-04-06 ENCOUNTER — Encounter: Payer: Self-pay | Admitting: Interventional Cardiology

## 2014-04-06 VITALS — BP 184/108 | HR 63 | Ht 61.0 in | Wt 234.0 lb

## 2014-04-06 DIAGNOSIS — E785 Hyperlipidemia, unspecified: Secondary | ICD-10-CM

## 2014-04-06 DIAGNOSIS — Z23 Encounter for immunization: Secondary | ICD-10-CM

## 2014-04-06 DIAGNOSIS — I2510 Atherosclerotic heart disease of native coronary artery without angina pectoris: Secondary | ICD-10-CM

## 2014-04-06 DIAGNOSIS — I10 Essential (primary) hypertension: Secondary | ICD-10-CM

## 2014-04-06 DIAGNOSIS — G4733 Obstructive sleep apnea (adult) (pediatric): Secondary | ICD-10-CM

## 2014-04-06 NOTE — Progress Notes (Signed)
Patient ID: Isabella [name], female   DOB: [birth year], 73 y.o.   MRN: [id number]    [address], [place]  [address] Phone: [phone] Fax:  [phone]  Date:  04/06/2014   ID:  Isabella, [name] 09-21-1940, MRN [id number]  PCP:  Marjorie Smolder, MD   ASSESSMENT:  1. Coronary artery disease without angina pectoris 2. Essential hypertension, with extremely poor control  3. Hyperlipidemia, followed by primary care 4. Obstructive sleep apnea stable on CPAP  PLAN:  1. Increase aerobic activity 2. Weight loss 3. No change in medical therapy 4. Clinical follow-up in one year 5. Flu shot   SUBJECTIVE: Isabella [name] is a 73 y.o. female who is had no cardiovascular complaints. She underwent a basic neurosurgical procedure that lasted 5 hours and now is in rehabilitation. The surgical procedure and the rehabilitation following has not resulted in any cardiopulmonary complaints. She denies angina, dyspnea, edema, palpitations, syncope, and claudication.   Wt Readings from Last 3 Encounters:  04/06/14 234 lb (106.142 kg)  02/01/14 231 lb (104.781 kg)  01/27/14 231 lb 8 oz (105.008 kg)     Past Medical History  Diagnosis Date  . Hypertension   . High cholesterol   . Coronary artery disease   . Myocardial infarct 03/24/2011    Dr. [name]  . Sleep apnea     uses cpap  . History of blood transfusion   . Lung nodule   . Arthritis   . Complication of anesthesia     Took a while to wake up with knee  . Fallen bladder   . Granulomatous lung disease   . GERD (gastroesophageal reflux disease)   . Obesity   . Hx of adenomatous colonic polyps   . Sarcoidosis     Current Outpatient Prescriptions  Medication Sig Dispense Refill  . aspirin 81 MG tablet Take 1 tablet (81 mg total) by mouth daily. 30 tablet   . atorvastatin (LIPITOR) 40 MG tablet Take 40 mg by mouth at bedtime.    . chlorpheniramine (CHLOR-TRIMETON) 4 MG tablet Take 8 mg by mouth at bedtime.      . furosemide (LASIX) 40 MG tablet Take 40 mg by mouth daily before breakfast.     . metoprolol tartrate (LOPRESSOR) 25 MG tablet Take 25 mg by mouth 2 (two) times daily.    . nitroGLYCERIN (NITROSTAT) 0.4 MG SL tablet Place 1 tablet (0.4 mg total) under the tongue as needed. For chest pain 25 tablet 3  . valsartan (DIOVAN) 160 MG tablet Take 160 mg by mouth daily.     No current facility-administered medications for this visit.    Allergies:    Allergies  Allergen Reactions  . Tramadol Shortness Of Breath and Other (See Comments)    Per patient "felt like lungs were being squeezed"  . Codeine     Back hurts  . Erythromycin Diarrhea  . Hydromorphone     Severe Lethargy  . Neomycin Swelling  . Penicillins Hives  . Percocet [Oxycodone-Acetaminophen]     confusion  . Zestril [Lisinopril] Cough    Social History:  The patient  reports that she has never smoked. She has never used smokeless tobacco. She reports that she does not drink alcohol or use illicit drugs.   ROS:  Please see the history of present illness.   Appetite is stable. Weight is increasing due to immobility. No transient neurological symptoms. Denies palpitations.   All other systems  reviewed and negative.   OBJECTIVE: VS:  BP 184/108 mmHg  Pulse 63  Ht 5' 1"  (1.549 m)  Wt 234 lb (106.142 kg)  BMI 44.24 kg/m2 Well nourished, well developed, in no acute distress, obese HEENT: normal Neck: JVD flat. Carotid bruit absent  Cardiac:  normal S1, S2; RRR; no murmur Lungs:  clear to auscultation bilaterally, no wheezing, rhonchi or rales Abd: soft, nontender, no hepatomegaly Ext: Edema trace bilateral. Pulses 2+ and symmetric Skin: warm and dry Neuro:  CNs 2-12 intact, no focal abnormalities noted  EKG:  Normal sinus rhythm and overall normal tracing       Signed, Illene Labrador III, MD 04/06/2014 11:58 AM

## 2014-04-06 NOTE — Patient Instructions (Signed)
Your physician wants you to follow-up in:  12 months.  You will receive a reminder letter in the mail two months in advance. If you don't receive a letter, please call our office to schedule the follow-up appointment.   

## 2014-04-21 ENCOUNTER — Encounter (HOSPITAL_COMMUNITY): Payer: Self-pay | Admitting: Interventional Cardiology

## 2014-07-15 ENCOUNTER — Ambulatory Visit: Payer: Medicare Other | Admitting: Pulmonary Disease

## 2014-08-01 ENCOUNTER — Encounter: Payer: Self-pay | Admitting: Pulmonary Disease

## 2014-08-01 ENCOUNTER — Ambulatory Visit (INDEPENDENT_AMBULATORY_CARE_PROVIDER_SITE_OTHER): Payer: Medicare Other | Admitting: Pulmonary Disease

## 2014-08-01 VITALS — BP 130/74 | HR 63 | Temp 97.2°F | Ht 61.5 in | Wt 239.4 lb

## 2014-08-01 DIAGNOSIS — G4733 Obstructive sleep apnea (adult) (pediatric): Secondary | ICD-10-CM

## 2014-08-01 DIAGNOSIS — D869 Sarcoidosis, unspecified: Secondary | ICD-10-CM

## 2014-08-01 NOTE — Assessment & Plan Note (Signed)
The patient carries the diagnosis of sarcoidosis based on a left lower lobe nodule biopsy in 2014, and had significant improvement in her nodules with a short course of prednisone. She has never been symptomatic from a pulmonary standpoint, and therefore does not require aggressive treatment. We will follow her symptoms going forward, and check a yearly x-ray.

## 2014-08-01 NOTE — Patient Instructions (Signed)
Continue on cpap, and keep up with mask changes and supplies. Keep working on weight loss Will check a chest xray yearly for a few years in light of your diagnosis of sarcoid.  Will do this at next visit.  followup with me again in one year

## 2014-08-01 NOTE — Assessment & Plan Note (Signed)
The patient is wearing C Pap compliantly, and feels that she is sleeping well with the device. She is also satisfied with her daytime alertness. She is having no issues with her mask fit or pressure. I have encouraged her to work aggressively on weight loss, and also to keep up with her mask changes and supplies.

## 2014-08-01 NOTE — Progress Notes (Signed)
   Subjective:    Patient ID: Isabella [name], female    DOB: [birth year], 74 y.o.   MRN: [id number]  HPI The patient comes in today for follow-up of her known obstructive sleep apnea. She also has a history of pulmonary nodules, with biopsy most consistent with the diagnosis of sarcoid. She has had a follow-up CT with either a decrease or stability in all of her nodules, and has remained asymptomatic from a pulmonary standpoint. She is wearing C Pap compliantly, and is having no issues with her mask fit or pressure. She feels that she is sleeping well with her device.   Review of Systems  Constitutional: Negative for fever, chills and unexpected weight change.  HENT: Negative for congestion, dental problem, ear pain, nosebleeds, postnasal drip, rhinorrhea, sinus pressure, sneezing, sore throat, trouble swallowing and voice change.   Eyes: Negative for redness, itching and visual disturbance.  Respiratory: Negative for cough, choking, chest tightness, shortness of breath and wheezing.   Cardiovascular: Negative for chest pain, palpitations and leg swelling.  Gastrointestinal: Negative for nausea, vomiting, abdominal pain and diarrhea.  Genitourinary: Negative for dysuria and difficulty urinating.  Musculoskeletal: Negative for joint swelling and arthralgias.  Skin: Negative for rash.  Neurological: Negative for tremors, syncope and headaches.  Hematological: Does not bruise/bleed easily.  Psychiatric/Behavioral: Negative for dysphoric mood. The patient is not nervous/anxious.        Objective:   Physical Exam Obese female in no acute distress Nose without purulence or discharge noted Neck without lymphadenopathy or thyromegaly No skin breakdown or pressure necrosis from the C Pap mask Mild lower extremity edema noted, no cyanosis Alert and oriented, moves all 4 extremities.       Assessment & Plan:

## 2014-08-15 ENCOUNTER — Other Ambulatory Visit: Payer: Self-pay

## 2014-08-15 DIAGNOSIS — Z1231 Encounter for screening mammogram for malignant neoplasm of breast: Secondary | ICD-10-CM

## 2014-08-22 ENCOUNTER — Ambulatory Visit
Admission: RE | Admit: 2014-08-22 | Discharge: 2014-08-22 | Disposition: A | Discharge status: Home / Self Care | Payer: Medicare Other | Source: Ambulatory Visit

## 2014-08-22 DIAGNOSIS — Z1231 Encounter for screening mammogram for malignant neoplasm of breast: Secondary | ICD-10-CM

## 2014-12-05 ENCOUNTER — Other Ambulatory Visit: Payer: Self-pay

## 2014-12-05 MED ORDER — NITROGLYCERIN 0.4 MG SL SUBL: 0.4000 mg | SUBLINGUAL_TABLET | SUBLINGUAL | Status: DC | PRN

## 2015-06-06 ENCOUNTER — Encounter: Payer: Self-pay | Admitting: Interventional Cardiology

## 2015-08-21 ENCOUNTER — Ambulatory Visit (INDEPENDENT_AMBULATORY_CARE_PROVIDER_SITE_OTHER): Payer: Medicare Other | Admitting: Pulmonary Disease

## 2015-08-21 ENCOUNTER — Encounter: Payer: Self-pay | Admitting: Pulmonary Disease

## 2015-08-21 VITALS — BP 132/90 | HR 69 | Ht 61.5 in | Wt 229.0 lb

## 2015-08-21 DIAGNOSIS — R911 Solitary pulmonary nodule: Secondary | ICD-10-CM

## 2015-08-21 DIAGNOSIS — J8410 Pulmonary fibrosis, unspecified: Secondary | ICD-10-CM | POA: Diagnosis not present

## 2015-08-21 DIAGNOSIS — G4733 Obstructive sleep apnea (adult) (pediatric): Secondary | ICD-10-CM

## 2015-08-21 NOTE — Assessment & Plan Note (Addendum)
Slight increase in size from 9 to 13 mm on last CT 01/2015  CT chest - no contrast in 4 months Her husbnad is on hospice right now

## 2015-08-21 NOTE — Assessment & Plan Note (Signed)
on auto CPAP settings - avg pr 12 cm Good complaince  Weight loss encouraged, compliance with goal of at least 4-6 hrs every night is the expectation. Advised against medications with sedative side effects Cautioned against driving when sleepy - understanding that sleepiness will vary on a day to day basis

## 2015-08-21 NOTE — Progress Notes (Signed)
   Subjective:    Patient ID: Isabella [name], female    DOB: [birth year], 75 y.o.   MRN: [id number]  HPI  Annual FU for follow-up of her obstructive sleep apnea. She also has a history of pulmonary nodules, with biopsy in 2014 (LLL) most consistent with the diagnosis of sarcoid  Chief Complaint  Patient presents with  . Follow-up    Former [place] patient: Wears CPAP nightly and with naps. Pt reports some leaking but is going to change the cushions tonight on the mask. DME: [facility]; Pt reports breathing has been doing well - CT in Sept 2016 showed larger nodule.    Husband on home hospice RLL nodule had been stable since 2013 Slight increase in size from 9 to 13 mm on last CT abd 01/2015 done by urology  Compliant with CPAP -mask ok, pr ok No dryness  Donwload 08/2015 - avg pr 12 cm,no residuals , no leak   Past Medical History  Diagnosis Date  . Hypertension   . High cholesterol   . Coronary artery disease   . Myocardial infarct ([place]) 03/24/2011    Dr. [name]  . Sleep apnea     uses cpap  . History of blood transfusion   . Lung nodule   . Arthritis   . Complication of anesthesia     Took a while to wake up with knee  . Fallen bladder   . Granulomatous lung disease   . GERD (gastroesophageal reflux disease)   . Obesity   . Hx of adenomatous colonic polyps   . Sarcoidosis ([place])     Review of Systems Patient denies significant dyspnea,cough, hemoptysis,  chest pain, palpitations, pedal edema, orthopnea, paroxysmal nocturnal dyspnea, lightheadedness, nausea, vomiting, abdominal or  leg pains      Objective:   Physical Exam  Gen. Pleasant, obese, in no distress ENT - no lesions, no post nasal drip Neck: No JVD, no thyromegaly, no carotid bruits Lungs: no use of accessory muscles, no dullness to percussion, decreased without rales or rhonchi  Cardiovascular: Rhythm regular, heart sounds  normal, no murmurs or gallops, no peripheral edema Musculoskeletal: No deformities, no  cyanosis or clubbing , no tremors       Assessment & Plan:

## 2015-08-21 NOTE — Patient Instructions (Signed)
CT chest - no contrast in 4 months You ar eon auto CPAP settings - avg pr 12 cm

## 2015-08-30 ENCOUNTER — Other Ambulatory Visit: Payer: Self-pay

## 2015-08-30 DIAGNOSIS — Z1231 Encounter for screening mammogram for malignant neoplasm of breast: Secondary | ICD-10-CM

## 2015-09-01 ENCOUNTER — Encounter: Payer: Self-pay | Admitting: Pulmonary Disease

## 2015-09-20 ENCOUNTER — Ambulatory Visit
Admission: RE | Admit: 2015-09-20 | Discharge: 2015-09-20 | Disposition: A | Discharge status: Home / Self Care | Payer: Medicare Other | Source: Ambulatory Visit

## 2015-09-20 DIAGNOSIS — Z1231 Encounter for screening mammogram for malignant neoplasm of breast: Secondary | ICD-10-CM

## 2015-10-16 ENCOUNTER — Other Ambulatory Visit: Payer: Self-pay | Admitting: Gastroenterology

## 2015-11-15 ENCOUNTER — Encounter: Payer: Self-pay | Admitting: Interventional Cardiology

## 2015-12-21 ENCOUNTER — Telehealth: Payer: Self-pay | Admitting: Pulmonary Disease

## 2015-12-21 ENCOUNTER — Ambulatory Visit (INDEPENDENT_AMBULATORY_CARE_PROVIDER_SITE_OTHER)
Admission: RE | Admit: 2015-12-21 | Discharge: 2015-12-21 | Disposition: A | Discharge status: Home / Self Care | Payer: Medicare Other | Source: Ambulatory Visit | Attending: Pulmonary Disease | Admitting: Pulmonary Disease

## 2015-12-21 DIAGNOSIS — R911 Solitary pulmonary nodule: Secondary | ICD-10-CM

## 2015-12-21 DIAGNOSIS — J8410 Pulmonary fibrosis, unspecified: Secondary | ICD-10-CM | POA: Diagnosis not present

## 2015-12-21 MED ORDER — PREDNISONE 10 MG PO TABS
ORAL_TABLET | ORAL | 0 refills | Status: DC
Start: 2015-12-21 — End: 2016-03-29

## 2015-12-21 NOTE — Telephone Encounter (Signed)
Called and spoke to pt. Informed her of the results and recs per RA. Pt verbalized understanding and states she is willing to take prednisone and she tolerated it well.   Dr. Elsworth Soho please advise. Thanks.   Notes Recorded by Rigoberto Noel, MD on 12/21/2015 at 11:21 AM EDT Nodule has increased slightly in size from 12 mm to 15 mm This may be related to sarcoidosis Would she be willing to take small dose of prednisone-then recheck CT scan on next visit to see if nodule goes down in size?

## 2015-12-21 NOTE — Telephone Encounter (Signed)
Prednisone 10 mg tabs  Take 2 tabs daily with food x 7ds, then 1 tab daily with food x 7ds then half tablet daily for 7 days then STOP

## 2015-12-21 NOTE — Telephone Encounter (Signed)
Rx for prednisone sent to pharmacy. Nothing further needed.

## 2016-01-04 ENCOUNTER — Telehealth: Payer: Self-pay | Admitting: Pulmonary Disease

## 2016-01-04 NOTE — Telephone Encounter (Signed)
LMTCB x 1 

## 2016-01-05 ENCOUNTER — Telehealth: Payer: Self-pay | Admitting: Pulmonary Disease

## 2016-01-05 DIAGNOSIS — G4733 Obstructive sleep apnea (adult) (pediatric): Secondary | ICD-10-CM

## 2016-01-05 NOTE — Telephone Encounter (Signed)
Pt returned call - she started on prednisone taper on 8/11, begins taking 1/2 tab (39m) today x7 days but she only has enough for 6 days.  Pt is asking if RA would prefer her to get #1 tablet from the pharmacy to complete the Rx or if she can stop at 6 days.  RA please advise, thank you.  **Triage, per pt okay to leave detailed message.

## 2016-01-05 NOTE — Telephone Encounter (Signed)
Okay to stop it 6 days

## 2016-01-05 NOTE — Telephone Encounter (Signed)
Called spoke with pt. She states that she contacted [facility] and they informed her that she is eligable for a new CPAP machine. I explained to her that I would need to send a message to RA to get his approval for the order. She voiced understanding and had no further questions.   RA please advise

## 2016-01-05 NOTE — Telephone Encounter (Signed)
Called and lmom to make pt aware of RA recs for the prednisone to stop at 6 days.  Nothing further is needed.

## 2016-01-05 NOTE — Telephone Encounter (Signed)
lmtcb x2 for pt. 

## 2016-01-07 NOTE — Telephone Encounter (Signed)
OK to send Rx for CPAP 12 cm

## 2016-01-08 NOTE — Telephone Encounter (Signed)
Order entered for new CPAP machine. Patient aware. Nothing further needed.

## 2016-01-28 ENCOUNTER — Encounter: Payer: Self-pay | Admitting: Pulmonary Disease

## 2016-01-30 ENCOUNTER — Encounter: Payer: Self-pay | Admitting: Pulmonary Disease

## 2016-01-30 ENCOUNTER — Ambulatory Visit (INDEPENDENT_AMBULATORY_CARE_PROVIDER_SITE_OTHER): Payer: Medicare Other | Admitting: Pulmonary Disease

## 2016-01-30 VITALS — BP 126/80 | HR 69 | Ht 61.5 in | Wt 237.0 lb

## 2016-01-30 DIAGNOSIS — R911 Solitary pulmonary nodule: Secondary | ICD-10-CM | POA: Diagnosis not present

## 2016-01-30 DIAGNOSIS — J8410 Pulmonary fibrosis, unspecified: Secondary | ICD-10-CM | POA: Diagnosis not present

## 2016-01-30 DIAGNOSIS — G4733 Obstructive sleep apnea (adult) (pediatric): Secondary | ICD-10-CM

## 2016-01-30 NOTE — Progress Notes (Signed)
   Subjective:    Patient ID: Isabella [name], female    DOB: 1940/11/27, 75 y.o.   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 Follow-up    Says she is doing well on her CPAP machine    Husband was on home hospice and passed away 3 months ago RLL nodule had been stable since 2013,Slight increase in size from 9 to 13 mm on last CT abd 01/2015 done by urology, confirmed by CT chest 12/2015 Of note this was noted to be PET negative in 2013, left lower lobe nodule had lighted up and hence this was biopsied and showed initially negative in 04/2012 and then nonnecrotizing granuloma and 05/2012 and repeat biopsy   She obtained a new CPAP machine and loves it ,Compliant with CPAP -mask ok, pr ok No dryness  Donwload on pr 12 cm,no residuals , no leak, excellent compliance   Review of Systems Patient denies significant dyspnea,cough, hemoptysis,  chest pain, palpitations, pedal edema, orthopnea, paroxysmal nocturnal dyspnea, lightheadedness, nausea, vomiting, abdominal or  leg pains      Objective:   Physical Exam Gen. Pleasant, obese, in no distress ENT - no lesions, no post nasal drip Neck: No JVD, no thyromegaly, no carotid bruits Lungs: no use of accessory muscles, no dullness to percussion, decreased without rales or rhonchi  Cardiovascular: Rhythm regular, heart sounds  normal, no murmurs or gallops, no peripheral edema Musculoskeletal: No deformities, no cyanosis or clubbing , no tremors        Assessment & Plan:

## 2016-01-30 NOTE — Patient Instructions (Signed)
Schedule  PET scan -right side nodule has increased few millimeter in size compared to 2015, but favor benign etiology

## 2016-01-30 NOTE — Assessment & Plan Note (Signed)
Schedule  PET scan -right side nodule has increased few millimeter in size compared to 2015, but favor benign etiology  This was noted to be PET negative in 2013, and biopsy of left nodule showed granulomas

## 2016-01-30 NOTE — Assessment & Plan Note (Signed)
Weight loss encouraged, compliance with goal of at least 4-6 hrs every night is the expectation. Advised against medications with sedative side effects Cautioned against driving when sleepy - understanding that sleepiness will vary on a day to day basis  

## 2016-02-07 ENCOUNTER — Encounter (HOSPITAL_COMMUNITY): Payer: Medicare Other

## 2016-02-09 ENCOUNTER — Encounter (HOSPITAL_COMMUNITY)
Admission: RE | Admit: 2016-02-09 | Discharge: 2016-02-09 | Disposition: A | Discharge status: Home / Self Care | Payer: Medicare Other | Source: Ambulatory Visit | Attending: Pulmonary Disease | Admitting: Pulmonary Disease

## 2016-02-09 DIAGNOSIS — R911 Solitary pulmonary nodule: Secondary | ICD-10-CM | POA: Insufficient documentation

## 2016-02-09 LAB — GLUCOSE, CAPILLARY: Glucose-Capillary: 105 mg/dL — ABNORMAL HIGH (ref 65–99)

## 2016-02-09 MED ORDER — FLUDEOXYGLUCOSE F - 18 (FDG) INJECTION
11.7500 | Freq: Once | INTRAVENOUS | Status: AC | PRN
  Administered 2016-02-09: 11.75 via INTRAVENOUS

## 2016-02-16 ENCOUNTER — Telehealth: Payer: Self-pay | Admitting: Pulmonary Disease

## 2016-02-16 DIAGNOSIS — R911 Solitary pulmonary nodule: Secondary | ICD-10-CM

## 2016-02-16 NOTE — Telephone Encounter (Signed)
I discussed PET scan results with her. [place] consensus was to proceed with ENB with biopsy We'll schedule repeat CT scan no contrast- super D format for November second week Please cancel her appointment for next week

## 2016-02-16 NOTE — Telephone Encounter (Signed)
Called to inform patient that appointment has been cancelled for next week and that CT scan no contrast will be scheduled in November.  Patient aware. Nothing further needed.

## 2016-02-20 ENCOUNTER — Ambulatory Visit: Payer: Medicare Other | Admitting: Pulmonary Disease

## 2016-03-19 ENCOUNTER — Ambulatory Visit (INDEPENDENT_AMBULATORY_CARE_PROVIDER_SITE_OTHER)
Admission: RE | Admit: 2016-03-19 | Discharge: 2016-03-19 | Disposition: A | Discharge status: Home / Self Care | Payer: Medicare Other | Source: Ambulatory Visit | Attending: Pulmonary Disease | Admitting: Pulmonary Disease

## 2016-03-19 DIAGNOSIS — R911 Solitary pulmonary nodule: Secondary | ICD-10-CM | POA: Diagnosis not present

## 2016-03-28 ENCOUNTER — Encounter: Payer: Self-pay | Admitting: Pulmonary Disease

## 2016-03-29 ENCOUNTER — Ambulatory Visit (INDEPENDENT_AMBULATORY_CARE_PROVIDER_SITE_OTHER): Payer: Medicare Other | Admitting: Pulmonary Disease

## 2016-03-29 ENCOUNTER — Encounter: Payer: Self-pay | Admitting: Pulmonary Disease

## 2016-03-29 DIAGNOSIS — G4733 Obstructive sleep apnea (adult) (pediatric): Secondary | ICD-10-CM | POA: Diagnosis not present

## 2016-03-29 DIAGNOSIS — R911 Solitary pulmonary nodule: Secondary | ICD-10-CM | POA: Diagnosis not present

## 2016-03-29 DIAGNOSIS — Z23 Encounter for immunization: Secondary | ICD-10-CM | POA: Diagnosis not present

## 2016-03-29 NOTE — Assessment & Plan Note (Signed)
CPAP 12 cm is working well  CPAP supplies will be renewed for a year

## 2016-03-29 NOTE — Patient Instructions (Signed)
Flu shot today CPAP is working well We discussed biopsy procedure in detail-this will be scheduled for Monday 11/27 at 1 PM Can have liquids around 6 AM nothing after that

## 2016-03-29 NOTE — Assessment & Plan Note (Signed)
Right lower lobe nodule is hypermetabolic-this Mr. Isabella [name] metastatic disease but we cannot rule out malignancy altogether Discussed at multidisciplinary conference We'll proceed with navigation bronchoscopy-scheduled for 11/27 at 1 PM Risks of procedure including that of general anesthesia, temperature requiring a chest tube and bleeding were discussed in detail

## 2016-03-29 NOTE — Progress Notes (Signed)
   Subjective:    Patient ID: Isabella [name], female    DOB: August 18, 1940, 75 y.o.   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 Follow-up    Pt. uses a CPAP machine, uses it every night, No complaints today    RLL nodule had been stable since 2013,Slight increase in size from 9 to 13 mm on  CT abd 01/2015 done by urology, confirmed by CT chest 12/2015 Of note this was noted to be PET negative in 2013, left lower lobe nodule had lighted up and hence this was biopsied and showed initially negative in 04/2012 and then nonnecrotizing granuloma and 05/2012 and repeat biopsy Repeat PET scan was discussed today, mild hypermetabolism and right lower lobe nodule  She reports being put to sleep 2 years ago, was "lazy" to wake up  Her new CPAP machine is working well, no mask and pressure issues, she reports good compliance with this Download shows excellent usage, no residuals on CPAP 12 cm without significant leak     Significant tests/ events    PET 10/5535 Hypermetabolic right lower lobe nodule, SUV 4.0 Scattered pulmonary nodules unchanged from 2015, right upper lobe 6 mm nodule SUV 2.0   Past Medical History:  Diagnosis Date  . Arthritis   . Complication of anesthesia    Took a while to wake up with knee  . Coronary artery disease   . Fallen bladder   . GERD (gastroesophageal reflux disease)   . 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 High cholesterol   . History of blood transfusion   . Hx of adenomatous colonic polyps   . Hypertension   . Lung nodule   . Myocardial infarct 03/24/2011   Dr. [name]  . Obesity   . Sarcoidosis ([place])   . Sleep apnea    uses cpap     Review of Systems neg for any significant sore throat, dysphagia, itching, sneezing, nasal  congestion or excess/ purulent secretions, fever, chills, sweats, unintended wt loss, pleuritic or exertional cp, hempoptysis, orthopnea pnd or change in chronic leg swelling. Also denies presyncope, palpitations, heartburn, abdominal pain, nausea, vomiting, diarrhea or change in bowel or urinary habits, dysuria,hematuria, rash, arthralgias, visual complaints, headache, numbness weakness or ataxia.     Objective:   Physical Exam   Gen. Pleasant, obese, in no distress ENT - no lesions, no post nasal drip Neck: No JVD, no thyromegaly, no carotid bruits Lungs: no use of accessory muscles, no dullness to percussion, decreased without rales or rhonchi  Cardiovascular: Rhythm regular, heart sounds  normal, no murmurs or gallops, no peripheral edema Musculoskeletal: No deformities, no cyanosis or clubbing , no tremors        Assessment & Plan:

## 2016-04-03 ENCOUNTER — Encounter (HOSPITAL_COMMUNITY): Payer: Self-pay

## 2016-04-03 ENCOUNTER — Encounter (HOSPITAL_COMMUNITY)
Admission: RE | Admit: 2016-04-03 | Discharge: 2016-04-03 | Disposition: A | Discharge status: Home / Self Care | Payer: Medicare Other | Source: Ambulatory Visit | Attending: Pulmonary Disease | Admitting: Pulmonary Disease

## 2016-04-03 ENCOUNTER — Other Ambulatory Visit (HOSPITAL_COMMUNITY): Payer: Self-pay | Admitting: *Deleted

## 2016-04-03 DIAGNOSIS — Z01812 Encounter for preprocedural laboratory examination: Secondary | ICD-10-CM | POA: Insufficient documentation

## 2016-04-03 DIAGNOSIS — D869 Sarcoidosis, unspecified: Secondary | ICD-10-CM | POA: Diagnosis not present

## 2016-04-03 DIAGNOSIS — G4733 Obstructive sleep apnea (adult) (pediatric): Secondary | ICD-10-CM | POA: Insufficient documentation

## 2016-04-03 DIAGNOSIS — I2510 Atherosclerotic heart disease of native coronary artery without angina pectoris: Secondary | ICD-10-CM | POA: Insufficient documentation

## 2016-04-03 DIAGNOSIS — I10 Essential (primary) hypertension: Secondary | ICD-10-CM | POA: Insufficient documentation

## 2016-04-03 DIAGNOSIS — Z01810 Encounter for preprocedural cardiovascular examination: Secondary | ICD-10-CM | POA: Insufficient documentation

## 2016-04-03 DIAGNOSIS — E785 Hyperlipidemia, unspecified: Secondary | ICD-10-CM | POA: Diagnosis not present

## 2016-04-03 HISTORY — DX: Sleep related leg cramps: G47.62

## 2016-04-03 HISTORY — DX: Headache, unspecified: R51.9

## 2016-04-03 HISTORY — DX: Corneal transplant status: Z94.7

## 2016-04-03 HISTORY — DX: Headache: R51

## 2016-04-03 HISTORY — DX: Dyspnea, unspecified: R06.00

## 2016-04-03 HISTORY — DX: Unspecified hemorrhoids: K64.9

## 2016-04-03 LAB — BASIC METABOLIC PANEL
Anion gap: 5 (ref 5–15)
BUN: 12 mg/dL (ref 6–20)
CALCIUM: 9.2 mg/dL (ref 8.9–10.3)
CO2: 31 mmol/L (ref 22–32)
CREATININE: 0.79 mg/dL (ref 0.44–1.00)
Chloride: 105 mmol/L (ref 101–111)
GFR calc Af Amer: >60 mL/min (ref >60)
GLUCOSE: 106 mg/dL — AB (ref 65–99)
POTASSIUM: 4.5 mmol/L (ref 3.5–5.1)
Sodium: 141 mmol/L (ref 135–145)

## 2016-04-03 LAB — CBC
HEMATOCRIT: 43 % (ref 36.0–46.0)
Hemoglobin: 13.9 g/dL (ref 12.0–15.0)
MCH: 29.6 pg (ref 26.0–34.0)
MCHC: 32.3 g/dL (ref 30.0–36.0)
MCV: 91.5 fL (ref 78.0–100.0)
PLATELETS: 228 10*3/uL (ref 150–400)
RBC: 4.7 MIL/uL (ref 3.87–5.11)
RDW: 15.2 % (ref 11.5–15.5)
WBC: 6.7 10*3/uL (ref 4.0–10.5)

## 2016-04-03 NOTE — Progress Notes (Signed)
Anesthesia Chart Review:  Pt is a 75 year old female scheduled for video bronchoscopy with endobronchial navigation on 04/08/2016 with Kara Mead, MD.   - PCP is [name], MD - Cardiologist is Dr. Daneen Schick, last visit 04/06/14. Pt is out of date with follow up, next appt scheduled 05/23/16  History includes non-smoker, CAD/STEMI 03/24/11 s/p DES to proximal RCA and PTCA distal RCA, post-cath right groin hematoma s/p surgical repair 03/25/11, OSA with CPAP use, sarcoidosis, hyperlipidemia, arthritis, T&A, hysterectomy, right TKA 06/17/12, PLIF 02/01/14. She reported that she took a while to wake up after her knee surgery. BMI 44.   Meds: ASA, Lipitor, lasix, metoprolol, Nitro, valsartan.   Preoperative labs reviewed.    CT chest 03/19/16: Stable 15 mm nodule in the medial right lower lobe, hypermetabolic on prior PET, suspicious for indolent primary bronchogenic neoplasm. Percutaneous sampling is suggested.  EKG 04/03/16: Sinus bradycardia (53 bpm) with 1st degree A-V block  Echo on 03/25/11 showed: Left ventricle: The cavity size was normal. There was mild concentric hypertrophy. Systolic function was normal. The estimated ejection fraction was in the range of 55% to 60%. Wall motion was normal; there were no regional wall motion abnormalities. There was an increased relative contribution of atrial contraction to ventricular filling. Doppler parameters are consistent with abnormal left ventricular relaxation (grade 1 diastolic dysfunction).  Cardiac cath on 03/24/11 (done for acute inferior STEMI) showed: Widely patent left main. Ectatic LAD wrapping around the left ventricular apex. One large diagonal arises from its bed. LCX is moderate in size with 2 large OM branches free of disease. RCA is widely patent proximal vessel bifurcating into a PDA and large left ventricular branch. The left ventricular branch proximal portion is much smaller than the distal vessel raising the question of  spontaneous dissection.The junction between this narrowed region and the more normal appearing distal vessel contains a 99% stenosis that we felt represented the culprit lesion. She underwent 1) Successful angioplasty of the focal high-grade stenosis in the left ventricular branch of the right coronary from 99% to less than 30%. 2) Successful drug-eluting stent implantation of the ostium of the right coronary as treatment of either a spontaneous or heart catheter-induced ostial dissection.    Pt denied CV sx at PAT.   Reviewed case with Dr. Marcie Bal. If no changes, I anticipate pt can proceed with surgery as scheduled.   Willeen Cass, FNP-BC [facility]/Anesthesiology Phone: [phone] 04/03/2016 4:25 PM

## 2016-04-03 NOTE — Pre-Procedure Instructions (Signed)
Isabella [name] [facility]  04/03/2016     Your procedure is scheduled on Monday, April 08, 2016 at 1:00 PM.   Report to [facility] Entrance "A" Admitting Office at 11:00 AM.   Call this number if you have problems the morning of surgery: [phone]   Questions prior to day of surgery, please call [phone] between 8 & 4 PM.   Remember:  Do not eat food or drink liquids after midnight Sunday, 04/07/16.  Take these medicines the morning of surgery with A SIP OF WATER: Metoprolol (Lopressor)  Stop Aspirin and Multivitamins as of today.    Do not wear jewelry, make-up or nail polish.  Do not wear lotions, powders, or perfumes.  Do not shave 48 hours prior to surgery.    Do not bring valuables to the hospital.  [facility] is not responsible for any belongings or valuables.  Contacts, dentures or bridgework may not be worn into surgery.  Leave your suitcase in the car.  After surgery it may be brought to your room.  For patients admitted to the hospital, discharge time will be determined by your treatment team.  Patients discharged the day of surgery will not be allowed to drive home.   Special instructions:  [place] - Preparing for Surgery  Before surgery, you can play an important role.  Because skin is not sterile, your skin needs to be as free of germs as possible.  You can reduce the number of germs on you skin by washing with CHG (chlorahexidine gluconate) soap before surgery.  CHG is an antiseptic cleaner which kills germs and bonds with the skin to continue killing germs even after washing.  Please DO NOT use if you have an allergy to CHG or antibacterial soaps.  If your skin becomes reddened/irritated stop using the CHG and inform your nurse when you arrive at Short Stay.  Do not shave (including legs and underarms) for at least 48 hours prior to the first CHG shower.  You may shave your face.  Please follow these instructions carefully:   1.  Shower with CHG  Soap the night before surgery and the                    morning of Surgery.  2.  If you choose to wash your hair, wash your hair first as usual with your       normal shampoo.  3.  After you shampoo, rinse your hair and body thoroughly to remove the shampoo.  4.  Use CHG as you would any other liquid soap.  You can apply chg directly       to the skin and wash gently with scrungie or a clean washcloth.  5.  Apply the CHG Soap to your body ONLY FROM THE NECK DOWN.        Do not use on open wounds or open sores.  Avoid contact with your eyes, ears, mouth and genitals (private parts).  Wash genitals (private parts) with your normal soap.  6.  Wash thoroughly, paying special attention to the area where your surgery        will be performed.  7.  Thoroughly rinse your body with warm water from the neck down.  8.  DO NOT shower/wash with your normal soap after using and rinsing off       the CHG Soap.  9.  Pat yourself dry with a clean towel.  10.  Wear clean pajamas.            11.  Place clean sheets on your bed the night of your first shower and do not        sleep with pets.  Day of Surgery  Do not apply any lotions the morning of surgery.  Please wear clean clothes to the hospital.   Please read over the following fact sheets that you were given.

## 2016-04-03 NOTE — Progress Notes (Signed)
Pt has hx of CAD with 1 stent since 2012. Pt denies any recent chest pain or sob. Dr. Daneen Schick is her cardiologist, last office visit was 04/06/14, next visit is Jan. 2018.

## 2016-04-08 ENCOUNTER — Encounter (HOSPITAL_COMMUNITY): Payer: Self-pay | Admitting: *Deleted

## 2016-04-08 ENCOUNTER — Encounter (HOSPITAL_COMMUNITY)
Admission: RE | Disposition: A | Discharge status: Home / Self Care | Payer: Self-pay | Source: Ambulatory Visit | Attending: Pulmonary Disease

## 2016-04-08 ENCOUNTER — Ambulatory Visit (HOSPITAL_COMMUNITY): Payer: Medicare Other | Admitting: Anesthesiology

## 2016-04-08 ENCOUNTER — Ambulatory Visit (HOSPITAL_COMMUNITY)
Admission: RE | Admit: 2016-04-08 | Discharge: 2016-04-08 | Disposition: A | Discharge status: Home / Self Care | Payer: Medicare Other | Source: Ambulatory Visit | Attending: Pulmonary Disease | Admitting: Pulmonary Disease

## 2016-04-08 ENCOUNTER — Ambulatory Visit (HOSPITAL_COMMUNITY): Payer: Medicare Other

## 2016-04-08 ENCOUNTER — Ambulatory Visit (HOSPITAL_COMMUNITY): Payer: Medicare Other | Admitting: Emergency Medicine

## 2016-04-08 ENCOUNTER — Other Ambulatory Visit: Payer: Self-pay | Admitting: Pulmonary Disease

## 2016-04-08 DIAGNOSIS — Z881 Allergy status to other antibiotic agents status: Secondary | ICD-10-CM | POA: Diagnosis not present

## 2016-04-08 DIAGNOSIS — K219 Gastro-esophageal reflux disease without esophagitis: Secondary | ICD-10-CM | POA: Diagnosis not present

## 2016-04-08 DIAGNOSIS — E669 Obesity, unspecified: Secondary | ICD-10-CM | POA: Insufficient documentation

## 2016-04-08 DIAGNOSIS — Z885 Allergy status to narcotic agent status: Secondary | ICD-10-CM | POA: Diagnosis not present

## 2016-04-08 DIAGNOSIS — I252 Old myocardial infarction: Secondary | ICD-10-CM | POA: Diagnosis not present

## 2016-04-08 DIAGNOSIS — I10 Essential (primary) hypertension: Secondary | ICD-10-CM | POA: Diagnosis not present

## 2016-04-08 DIAGNOSIS — I2510 Atherosclerotic heart disease of native coronary artery without angina pectoris: Secondary | ICD-10-CM | POA: Diagnosis not present

## 2016-04-08 DIAGNOSIS — Z6841 Body Mass Index (BMI) 40.0 and over, adult: Secondary | ICD-10-CM | POA: Insufficient documentation

## 2016-04-08 DIAGNOSIS — R911 Solitary pulmonary nodule: Secondary | ICD-10-CM | POA: Diagnosis not present

## 2016-04-08 DIAGNOSIS — Z7982 Long term (current) use of aspirin: Secondary | ICD-10-CM | POA: Insufficient documentation

## 2016-04-08 DIAGNOSIS — Z86010 Personal history of colonic polyps: Secondary | ICD-10-CM | POA: Insufficient documentation

## 2016-04-08 DIAGNOSIS — M1990 Unspecified osteoarthritis, unspecified site: Secondary | ICD-10-CM | POA: Diagnosis not present

## 2016-04-08 DIAGNOSIS — Z888 Allergy status to other drugs, medicaments and biological substances status: Secondary | ICD-10-CM | POA: Insufficient documentation

## 2016-04-08 DIAGNOSIS — D869 Sarcoidosis, unspecified: Secondary | ICD-10-CM | POA: Diagnosis not present

## 2016-04-08 DIAGNOSIS — R918 Other nonspecific abnormal finding of lung field: Secondary | ICD-10-CM | POA: Insufficient documentation

## 2016-04-08 DIAGNOSIS — E7800 Pure hypercholesterolemia, unspecified: Secondary | ICD-10-CM | POA: Insufficient documentation

## 2016-04-08 DIAGNOSIS — Z9889 Other specified postprocedural states: Secondary | ICD-10-CM

## 2016-04-08 DIAGNOSIS — Z880 Allergy status to penicillin: Secondary | ICD-10-CM | POA: Insufficient documentation

## 2016-04-08 DIAGNOSIS — Z419 Encounter for procedure for purposes other than remedying health state, unspecified: Secondary | ICD-10-CM

## 2016-04-08 DIAGNOSIS — J8410 Pulmonary fibrosis, unspecified: Secondary | ICD-10-CM | POA: Insufficient documentation

## 2016-04-08 DIAGNOSIS — G4730 Sleep apnea, unspecified: Secondary | ICD-10-CM | POA: Insufficient documentation

## 2016-04-08 HISTORY — PX: VIDEO BRONCHOSCOPY WITH ENDOBRONCHIAL NAVIGATION: SHX6175

## 2016-04-08 SURGERY — VIDEO BRONCHOSCOPY WITH ENDOBRONCHIAL NAVIGATION
Anesthesia: General | Site: Bronchus

## 2016-04-08 MED ORDER — FENTANYL CITRATE (PF) 100 MCG/2ML IJ SOLN: 25.0000 ug | INTRAMUSCULAR | Status: DC | PRN

## 2016-04-08 MED ORDER — ROCURONIUM BROMIDE 100 MG/10ML IV SOLN
INTRAVENOUS | Status: DC | PRN
  Administered 2016-04-08: 50 mg via INTRAVENOUS
  Administered 2016-04-08: 20 mg via INTRAVENOUS

## 2016-04-08 MED ORDER — LIDOCAINE HCL (CARDIAC) 20 MG/ML IV SOLN
INTRAVENOUS | Status: DC | PRN
  Administered 2016-04-08: 50 mg via INTRAVENOUS

## 2016-04-08 MED ORDER — ONDANSETRON HCL 4 MG/2ML IJ SOLN
INTRAMUSCULAR | Status: AC
  Filled 2016-04-08: qty 2

## 2016-04-08 MED ORDER — DEXAMETHASONE SODIUM PHOSPHATE 10 MG/ML IJ SOLN
INTRAMUSCULAR | Status: DC | PRN
  Administered 2016-04-08: 10 mg via INTRAVENOUS

## 2016-04-08 MED ORDER — LACTATED RINGERS IV SOLN
INTRAVENOUS | Status: DC
Start: 2016-04-08 — End: 2016-04-08
  Administered 2016-04-08: 12:00:00 via INTRAVENOUS

## 2016-04-08 MED ORDER — MIDAZOLAM HCL 5 MG/5ML IJ SOLN
INTRAMUSCULAR | Status: DC | PRN
  Administered 2016-04-08: 2 mg via INTRAVENOUS

## 2016-04-08 MED ORDER — SUGAMMADEX SODIUM 200 MG/2ML IV SOLN
INTRAVENOUS | Status: AC
  Filled 2016-04-08: qty 2

## 2016-04-08 MED ORDER — MIDAZOLAM HCL 2 MG/2ML IJ SOLN
INTRAMUSCULAR | Status: AC
  Filled 2016-04-08: qty 2

## 2016-04-08 MED ORDER — METOCLOPRAMIDE HCL 5 MG/ML IJ SOLN
INTRAMUSCULAR | Status: DC | PRN
  Administered 2016-04-08: 10 mg via INTRAVENOUS

## 2016-04-08 MED ORDER — FENTANYL CITRATE (PF) 100 MCG/2ML IJ SOLN
INTRAMUSCULAR | Status: DC | PRN
  Administered 2016-04-08: 100 ug via INTRAVENOUS

## 2016-04-08 MED ORDER — METOCLOPRAMIDE HCL 5 MG/ML IJ SOLN
INTRAMUSCULAR | Status: AC
  Filled 2016-04-08: qty 2

## 2016-04-08 MED ORDER — PROPOFOL 10 MG/ML IV BOLUS
INTRAVENOUS | Status: DC | PRN
  Administered 2016-04-08: 130 mg via INTRAVENOUS

## 2016-04-08 MED ORDER — 0.9 % SODIUM CHLORIDE (POUR BTL) OPTIME
TOPICAL | Status: DC | PRN
  Administered 2016-04-08: 1000 mL

## 2016-04-08 MED ORDER — EPINEPHRINE PF 1 MG/ML IJ SOLN
INTRAMUSCULAR | Status: AC
  Filled 2016-04-08: qty 1

## 2016-04-08 MED ORDER — LACTATED RINGERS IV SOLN
INTRAVENOUS | Status: DC | PRN
  Administered 2016-04-08: 12:00:00 via INTRAVENOUS

## 2016-04-08 MED ORDER — SUGAMMADEX SODIUM 200 MG/2ML IV SOLN
INTRAVENOUS | Status: DC | PRN
  Administered 2016-04-08: 200 mg via INTRAVENOUS

## 2016-04-08 MED ORDER — ONDANSETRON HCL 4 MG/2ML IJ SOLN: 4.0000 mg | Freq: Once | INTRAMUSCULAR | Status: DC | PRN

## 2016-04-08 MED ORDER — PHENYLEPHRINE HCL 10 MG/ML IJ SOLN
INTRAVENOUS | Status: DC | PRN
  Administered 2016-04-08: 10 ug/min via INTRAVENOUS

## 2016-04-08 MED ORDER — ONDANSETRON HCL 4 MG/2ML IJ SOLN
INTRAMUSCULAR | Status: DC | PRN
  Administered 2016-04-08: 4 mg via INTRAVENOUS

## 2016-04-08 MED ORDER — LIDOCAINE HCL 4 % MT SOLN
OROMUCOSAL | Status: DC | PRN
  Administered 2016-04-08: 4 mL via TOPICAL

## 2016-04-08 MED ORDER — ARTIFICIAL TEARS OP OINT
TOPICAL_OINTMENT | OPHTHALMIC | Status: AC
  Filled 2016-04-08: qty 3.5

## 2016-04-08 MED ORDER — FENTANYL CITRATE (PF) 100 MCG/2ML IJ SOLN
INTRAMUSCULAR | Status: AC
  Filled 2016-04-08: qty 2

## 2016-04-08 MED ORDER — DEXAMETHASONE SODIUM PHOSPHATE 10 MG/ML IJ SOLN
INTRAMUSCULAR | Status: AC
  Filled 2016-04-08: qty 1

## 2016-04-08 SURGICAL SUPPLY — 38 items
ADAPTER BRONCH F/PENTAX (ADAPTER) ×5 IMPLANT
ADPR BSCP EDG PNTX (ADAPTER) ×2
BRUSH SUPERTRAX BIOPSY (INSTRUMENTS) IMPLANT
BRUSH SUPERTRAX NDL-TIP CYTO (INSTRUMENTS) ×2 IMPLANT
CANISTER SUCTION 2500CC (MISCELLANEOUS) ×3 IMPLANT
CHANNEL WORK EXTEND EDGE 180 (KITS) IMPLANT
CHANNEL WORK EXTEND EDGE 45 (KITS) IMPLANT
CHANNEL WORK EXTEND EDGE 90 (KITS) IMPLANT
CONT SPEC 4OZ CLIKSEAL STRL BL (MISCELLANEOUS) ×5 IMPLANT
COVER TABLE BACK 60X90 (DRAPES) ×3 IMPLANT
DRAPE C-ARM 42X72 X-RAY (DRAPES) ×1 IMPLANT
FILTER STRAW FLUID ASPIR (MISCELLANEOUS) IMPLANT
FORCEPS BIOP SUPERTRX PREMAR (INSTRUMENTS) ×2 IMPLANT
GAUZE SPONGE 4X4 12PLY STRL (GAUZE/BANDAGES/DRESSINGS) ×3 IMPLANT
GLOVE SS N UNI LF 7.0 STRL (GLOVE) ×2 IMPLANT
GLOVE SURG SIGNA 7.5 PF LTX (GLOVE) ×3 IMPLANT
KIT CLEAN ENDO COMPLIANCE (KITS) ×3 IMPLANT
KIT LOCATABLE GUIDE (CANNULA) IMPLANT
KIT MARKER FIDUCIAL DELIVERY (KITS) IMPLANT
KIT PROCEDURE EDGE 180 (KITS) IMPLANT
KIT PROCEDURE EDGE 45 (KITS) IMPLANT
KIT PROCEDURE EDGE 90 (KITS) ×2 IMPLANT
KIT ROOM TURNOVER OR (KITS) ×3 IMPLANT
MARKER SKIN DUAL TIP RULER LAB (MISCELLANEOUS) ×3 IMPLANT
NDL SUPERTRX PREMARK BIOPSY (NEEDLE) IMPLANT
NEEDLE SUPERTRX PREMARK BIOPSY (NEEDLE) ×3 IMPLANT
NS IRRIG 1000ML POUR BTL (IV SOLUTION) ×3 IMPLANT
OIL SILICONE PENTAX (PARTS (SERVICE/REPAIRS)) ×3 IMPLANT
PAD ARMBOARD 7.5X6 YLW CONV (MISCELLANEOUS) ×6 IMPLANT
PATCHES PATIENT (LABEL) ×7 IMPLANT
SYR 20ML ECCENTRIC (SYRINGE) ×3 IMPLANT
SYR 30ML LL (SYRINGE) ×3 IMPLANT
SYRINGE 20CC LL (MISCELLANEOUS) ×4 IMPLANT
TOWEL OR 17X24 6PK STRL BLUE (TOWEL DISPOSABLE) ×3 IMPLANT
TRAP SPECIMEN MUCOUS 40CC (MISCELLANEOUS) ×5 IMPLANT
TUBE CONNECTING 20'X1/4 (TUBING) ×1
TUBE CONNECTING 20X1/4 (TUBING) ×2 IMPLANT
WATER STERILE IRR 1000ML POUR (IV SOLUTION) ×3 IMPLANT

## 2016-04-08 NOTE — H&P (View-Only) (Signed)
   Subjective:    Patient ID: Isabella [name], female    DOB: June 21, 1940, 75 y.o.   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 Follow-up    Pt. uses a CPAP machine, uses it every night, No complaints today    RLL nodule had been stable since 2013,Slight increase in size from 9 to 13 mm on  CT abd 01/2015 done by urology, confirmed by CT chest 12/2015 Of note this was noted to be PET negative in 2013, left lower lobe nodule had lighted up and hence this was biopsied and showed initially negative in 04/2012 and then nonnecrotizing granuloma and 05/2012 and repeat biopsy Repeat PET scan was discussed today, mild hypermetabolism and right lower lobe nodule  She reports being put to sleep 2 years ago, was "lazy" to wake up  Her new CPAP machine is working well, no mask and pressure issues, she reports good compliance with this Download shows excellent usage, no residuals on CPAP 12 cm without significant leak     Significant tests/ events    PET 05/7354 Hypermetabolic right lower lobe nodule, SUV 4.0 Scattered pulmonary nodules unchanged from 2015, right upper lobe 6 mm nodule SUV 2.0   Past Medical History:  Diagnosis Date  . Arthritis   . Complication of anesthesia    Took a while to wake up with knee  . Coronary artery disease   . Fallen bladder   . GERD (gastroesophageal reflux disease)   . 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 High cholesterol   . History of blood transfusion   . Hx of adenomatous colonic polyps   . Hypertension   . Lung nodule   . Myocardial infarct 03/24/2011   Dr. [name]  . Obesity   . Sarcoidosis ([place])   . Sleep apnea    uses cpap     Review of Systems neg for any significant sore throat, dysphagia, itching, sneezing, nasal  congestion or excess/ purulent secretions, fever, chills, sweats, unintended wt loss, pleuritic or exertional cp, hempoptysis, orthopnea pnd or change in chronic leg swelling. Also denies presyncope, palpitations, heartburn, abdominal pain, nausea, vomiting, diarrhea or change in bowel or urinary habits, dysuria,hematuria, rash, arthralgias, visual complaints, headache, numbness weakness or ataxia.     Objective:   Physical Exam   Gen. Pleasant, obese, in no distress ENT - no lesions, no post nasal drip Neck: No JVD, no thyromegaly, no carotid bruits Lungs: no use of accessory muscles, no dullness to percussion, decreased without rales or rhonchi  Cardiovascular: Rhythm regular, heart sounds  normal, no murmurs or gallops, no peripheral edema Musculoskeletal: No deformities, no cyanosis or clubbing , no tremors        Assessment & Plan:

## 2016-04-08 NOTE — Anesthesia Postprocedure Evaluation (Signed)
Anesthesia Post Note  Patient: Isabella [name]  Procedure(s) Performed: Procedure(s) (LRB): VIDEO BRONCHOSCOPY WITH ENDOBRONCHIAL NAVIGATION (N/A)  Patient location during evaluation: PACU Anesthesia Type: General Level of consciousness: awake, awake and alert and oriented Pain management: pain level controlled Vital Signs Assessment: post-procedure vital signs reviewed and stable Respiratory status: spontaneous breathing, nonlabored ventilation and respiratory function stable Cardiovascular status: blood pressure returned to baseline Anesthetic complications: no    Last Vitals:  Vitals:   04/08/16 1445 04/08/16 1509  BP:  (!) 150/74  Pulse:  (!) 57  Resp: 16 16  Temp:      Last Pain:  Vitals:   04/08/16 1415  TempSrc:   PainSc: 0-No pain                 Arsal Tappan COKER

## 2016-04-08 NOTE — Interval H&P Note (Signed)
History and Physical Interval Note:  04/08/2016 12:25 PM  [name]  has presented today for surgery, with the diagnosis of LUNG MASS  The various methods of treatment have been discussed with the patient and family. After consideration of risks, benefits and other options for treatment, the patient has consented to  Procedure(s): [place] (N/A) as a surgical intervention .  The patient's history has been reviewed, patient examined, no change in status, stable for surgery.  I have reviewed the patient's chart and labs.  Questions were answered to the patient's satisfaction.     [name] V.

## 2016-04-08 NOTE — Anesthesia Preprocedure Evaluation (Signed)
Anesthesia Evaluation  Patient identified by MRN, date of birth, ID band Patient awake    Reviewed: Allergy & Precautions, NPO status , Patient's Chart, lab work & pertinent test results  Airway Mallampati: II  TM Distance: >3 FB Neck ROM: Full    Dental  (+) Teeth Intact, Dental Advisory Given   Pulmonary    breath sounds clear to auscultation       Cardiovascular hypertension,  Rhythm:Regular Rate:Normal     Neuro/Psych    GI/Hepatic   Endo/Other    Renal/GU      Musculoskeletal   Abdominal (+) + obese,   Peds  Hematology   Anesthesia Other Findings   Reproductive/Obstetrics                             Anesthesia Physical Anesthesia Plan  ASA: III  Anesthesia Plan: General   Post-op Pain Management:    Induction: Intravenous  Airway Management Planned: Oral ETT  Additional Equipment:   Intra-op Plan:   Post-operative Plan: Extubation in OR  Informed Consent: I have reviewed the patients History and Physical, chart, labs and discussed the procedure including the risks, benefits and alternatives for the proposed anesthesia with the patient or authorized representative who has indicated his/her understanding and acceptance.   Dental advisory given  Plan Discussed with: CRNA and Anesthesiologist  Anesthesia Plan Comments: (H/O sarcoidosis CAD S/P DES RCA X 2 11/12 for Inferior wall STEMI last EF 55% Hypertension Sleep apnea on CPAP   Plan GA with oral ETT  Isabella [name])        Anesthesia Quick Evaluation

## 2016-04-08 NOTE — Anesthesia Procedure Notes (Signed)
Procedure Name: Intubation Date/Time: 04/08/2016 12:41 PM Performed by: Jacquiline Doe A Pre-anesthesia Checklist: Patient identified, Emergency Drugs available, Suction available and Patient being monitored Patient Re-evaluated:Patient Re-evaluated prior to inductionOxygen Delivery Method: Circle System Utilized and Circle system utilized Preoxygenation: Pre-oxygenation with 100% oxygen Intubation Type: IV induction and Cricoid Pressure applied Ventilation: Mask ventilation without difficulty and Oral airway inserted - appropriate to patient size Laryngoscope Size: Mac and 4 Grade View: Grade I Tube type: Oral Number of attempts: 1 Airway Equipment and Method: Stylet,  Oral airway and LTA kit utilized Placement Confirmation: ETT inserted through vocal cords under direct vision,  positive ETCO2 and breath sounds checked- equal and bilateral Secured at: 22 cm Tube secured with: Tape Dental Injury: Teeth and Oropharynx as per pre-operative assessment

## 2016-04-08 NOTE — Transfer of Care (Signed)
Immediate Anesthesia Transfer of Care Note  Patient: Isabella [name] [facility]  Procedure(s) Performed: Procedure(s): VIDEO BRONCHOSCOPY WITH ENDOBRONCHIAL NAVIGATION (N/A)  Patient Location: PACU  Anesthesia Type:General  Level of Consciousness: awake, oriented, sedated, patient cooperative and responds to stimulation  Airway & Oxygen Therapy: Patient Spontanous Breathing and Patient connected to nasal cannula oxygen  Post-op Assessment: Report given to RN, Post -op Vital signs reviewed and stable, Patient moving all extremities and Patient moving all extremities X 4  Post vital signs: Reviewed and stable  Last Vitals:  Vitals:   04/08/16 1123 04/08/16 1415  BP: (!) 145/70 (P) 126/66  Pulse: 64   Resp: 18   Temp: 36.9 C (P) 36.4 C    Last Pain:  Vitals:   04/08/16 1123  TempSrc: Oral      Patients Stated Pain Goal: 3 (84/03/75 4360)  Complications: No apparent anesthesia complications

## 2016-04-08 NOTE — Op Note (Signed)
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  Estimated Blood Loss: Minimal  Complications: none  Indications and History: Isabella [name] is a 75 y.o. 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  The risks, benefits, complications, treatment options and expected outcomes were discussed with the patient.  The possibilities of pneumothorax, pneumonia, reaction to medication, pulmonary aspiration, perforation of a viscus, bleeding, failure to diagnose a condition and creating a complication requiring transfusion or operation were discussed with the patient who freely signed the consent.    Description of Procedure: The patient was seen in the Preoperative Area, was examined and was deemed appropriate to proceed.  The patient was taken to OR 10, identified as Isabella [name] and the procedure verified as Flexible Video Fiberoptic Bronchoscopy.  A Time Out was held and the above information confirmed.   Prior to the date of the procedure a high-resolution CT scan of the chest was performed. Utilizing [place] a virtual tracheobronchial tree was generated to allow the creation of distinct navigation pathways to the patient's parenchymal abnormalities. After being taken to the operating room general anesthesia was initiated and the patient  was orally intubated. The video fiberoptic bronchoscope was introduced via the endotracheal tube and a general inspection was performed which showed no secretions or lesions. The extendable working channel and locator guide were introduced into the bronchoscope. The distinct navigation pathways prepared prior to this procedure  were then utilized to navigate to within 2.5 cm of patient's lesion(s) identified on CT scan. The extendable working channel was secured into place and the locator guide was withdrawn. Under fluoroscopic guidance transbronchial needle brushings and transbronchial forceps biopsies were performed to be sent for cytology and pathology. A bronchioalveolar lavage was performed in the RLL and sent for cytology . At the end of the procedure a general airway inspection was performed and there was no evidence of active bleeding. The bronchoscope was removed.  The patient tolerated the procedure well. There was no significant blood loss and there were no obvious complications. A post-procedural chest x-ray is pending.  Samples: 1. Transbronchial needle brushings from RLL 2. Transbronchial forceps biopsies from RLL 3. Bronchoalveolar lavage from RLL   Plans:  The patient will be discharged from the PACU to home when recovered from anesthesia and after chest x-ray is reviewed. We will review the cytology, pathology  results with the patient when they become available. Outpatient followup will be with Dr Elsworth Soho in 4-6 wks.   Rigoberto Noel MD 04/08/2016

## 2016-04-09 ENCOUNTER — Other Ambulatory Visit: Payer: Self-pay | Admitting: Pulmonary Disease

## 2016-04-09 ENCOUNTER — Encounter (HOSPITAL_COMMUNITY): Payer: Self-pay | Admitting: Pulmonary Disease

## 2016-04-09 DIAGNOSIS — R911 Solitary pulmonary nodule: Secondary | ICD-10-CM

## 2016-04-10 ENCOUNTER — Telehealth: Payer: Self-pay | Admitting: Pulmonary Disease

## 2016-04-10 NOTE — Telephone Encounter (Signed)
Spoke with the pt  She is asking if her biopsy showed any inflammation, and if so, should she get back on prednisone  She is aware RA out of the office today, and is okay with waiting until he returns  Please advise, thanks!

## 2016-04-10 NOTE — Telephone Encounter (Signed)
No indication of active sarcoid No requirement for prednisone

## 2016-04-10 NOTE — Telephone Encounter (Signed)
Pt is aware of results. Nothing further is needed.

## 2016-04-10 NOTE — Telephone Encounter (Signed)
Patient returned phone call, contact # [phone] (cell).Isabella [name]

## 2016-04-10 NOTE — Telephone Encounter (Signed)
Pt returning call and I let her know that there was no inflamation and not need for predinsone and she was ok with that.Isabella [name]

## 2016-04-10 NOTE — Telephone Encounter (Signed)
lmomtcb x1 

## 2016-04-10 NOTE — Telephone Encounter (Signed)
lmtcb for pt.  

## 2016-05-05 ENCOUNTER — Encounter (HOSPITAL_COMMUNITY): Payer: Self-pay

## 2016-05-05 ENCOUNTER — Ambulatory Visit (HOSPITAL_COMMUNITY)
Admission: EM | Admit: 2016-05-05 | Discharge: 2016-05-05 | Disposition: A | Discharge status: Home / Self Care | Payer: Medicare Other | Attending: Family Medicine | Admitting: Family Medicine

## 2016-05-05 DIAGNOSIS — J0100 Acute maxillary sinusitis, unspecified: Secondary | ICD-10-CM | POA: Diagnosis not present

## 2016-05-05 DIAGNOSIS — J209 Acute bronchitis, unspecified: Secondary | ICD-10-CM

## 2016-05-05 DIAGNOSIS — H6502 Acute serous otitis media, left ear: Secondary | ICD-10-CM

## 2016-05-05 MED ORDER — BENZONATATE 100 MG PO CAPS: 100.0000 mg | ORAL_CAPSULE | Freq: Two times a day (BID) | ORAL | 0 refills | Status: DC | PRN

## 2016-05-05 MED ORDER — LEVOFLOXACIN 500 MG PO TABS: 500.0000 mg | ORAL_TABLET | Freq: Every day | ORAL | 0 refills | Status: DC

## 2016-05-05 NOTE — ED Triage Notes (Signed)
Pt wanted to rule out if she has the flju. productive Cough, nasal drainage, cough worse at night, fatigue for 2 days. No fever or body aches. No otc meds taken.

## 2016-05-05 NOTE — ED Provider Notes (Signed)
[facility]    CSN: [id number] Arrival date & time: 05/05/16  1202     History   Chief Complaint Chief Complaint  Patient presents with  . Cough    HPI Isabella [name] is a 75 y.o. female.   This is 75 year old woman who comes in with 2 days of progressive cough, sinus congestion, and left ear fullness. She's had no fever. She took a Tessalon probably Left over from a illness in 2015 but this did not help.  She denies fever, nausea, vomiting, or sore throat. She is a CPAP machine and her throat has been somewhat dry.      Past Medical History:  Diagnosis Date  . Arthritis   . Complication of anesthesia    Took a while to wake up with knee  . Coronary artery disease   . Dyspnea    with walking at times  . Fallen bladder   . GERD (gastroesophageal reflux disease)   . 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 Headache    migraines in her younger years  . Hemorrhoids   . High cholesterol   . History of blood transfusion   . Hx of adenomatous colonic polyps   . Hypertension   . Lung nodule   . Myocardial infarct 03/24/2011   Dr. [name]  . Nocturnal leg cramps   . Obesity   . Sarcoidosis ([place])   . Sleep apnea    uses cpap    Patient Active Problem List   Diagnosis Date Noted  . Degenerative spondylolisthesis 02/01/2014  . Sarcoidosis ([place]) 07/14/2013  . Abnormal CT of the chest 04/21/2013  . Coronary atherosclerosis of native coronary artery 03/29/2013    Class: Chronic  . Old MI (myocardial infarction) 03/29/2013    Class: Chronic  . OA (osteoarthritis) of knee 06/17/2012  . Lung nodule 06/03/2012  . DJD (degenerative joint disease) 06/01/2012  . 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 Edema of lower extremity 03/29/2011  . Anemia associated with acute blood loss 03/25/2011  . Hematoma following PCI 03/24/2011  . Obesity (BMI 30-39.9)   . Obstructive sleep apnea 01/25/2009    . Hyperlipidemia 01/24/2009  . Essential hypertension 01/24/2009    Past Surgical History:  Procedure Laterality Date  . ABDOMINAL HYSTERECTOMY  1990  . APPENDECTOMY  1970  . BACK SURGERY  2015   lower back  . CARDIAC CATHETERIZATION  03/2011  . CARPAL TUNNEL RELEASE Bilateral 2004  . CATARACT EXTRACTION    . CHOLECYSTECTOMY  1970  . COLONOSCOPY W/ BIOPSIES AND POLYPECTOMY    . CORONARY ANGIOPLASTY  2012  . CORONARY STENT PLACEMENT  03/24/2011   Dr. [name]  . EYE SURGERY  2008,2011   corneal transplant  . FEMORAL ARTERY EXPLORATION  03/25/2011   Procedure: FEMORAL ARTERY EXPLORATION;  Surgeon: Elam Dutch, MD;  Location: [facility];  Service: Vascular;  Laterality: Right;  Evacuation of Hematoma   . hematoma surgery     from cardiac cath -hematoma in groin-right  . HERNIA REPAIR  9798   umbilical  . JOINT REPLACEMENT    . KNEE ARTHROSCOPY  2006   Right  . [facility] Left Eye  2012  . LEFT HEART CATHETERIZATION WITH CORONARY ANGIOGRAM N/A 03/24/2011   Procedure: LEFT HEART CATHETERIZATION WITH CORONARY ANGIOGRAM;  Surgeon: Sinclair Grooms, MD;  Location: [facility] CATH LAB;  Service: Cardiovascular;  Laterality:  N/A;  . PERCUTANEOUS CORONARY STENT INTERVENTION (PCI-S) N/A 03/24/2011   Procedure: PERCUTANEOUS CORONARY STENT INTERVENTION (PCI-S);  Surgeon: Sinclair Grooms, MD;  Location: [facility] CATH LAB;  Service: Cardiovascular;  Laterality: N/A;  . SHOULDER SURGERY  2010  . TONSILLECTOMY  1949  . TONSILLECTOMY AND ADENOIDECTOMY    . TOTAL KNEE ARTHROPLASTY  07/2010   Left  . TOTAL KNEE ARTHROPLASTY  06/17/2012   Procedure: TOTAL KNEE ARTHROPLASTY;  Surgeon: Gearlean Alf, MD;  Location: [facility];  Service: Orthopedics;  Laterality: Right;  . TUBAL LIGATION  1978  . [place], 2007   x 2  . VIDEO BRONCHOSCOPY WITH ENDOBRONCHIAL NAVIGATION  04/13/2012   Procedure: VIDEO BRONCHOSCOPY WITH ENDOBRONCHIAL NAVIGATION;  Surgeon: Melrose Nakayama, MD;  Location: [facility];  Service: Thoracic;  Laterality: N/A;  . VIDEO BRONCHOSCOPY WITH ENDOBRONCHIAL NAVIGATION N/A 04/08/2016   Procedure: VIDEO BRONCHOSCOPY WITH ENDOBRONCHIAL NAVIGATION;  Surgeon: Rigoberto Noel, MD;  Location: [facility];  Service: Thoracic;  Laterality: N/A;    OB History    No data available       Home Medications    Prior to Admission medications   Medication Sig Start Date End Date Taking? Authorizing Provider  Ascorbic Acid (VITAMIN C ADULT GUMMIES) 125 MG CHEW Chew 2 capsules by mouth daily. Takes 2 chewables daily   Yes Historical Provider, MD  aspirin 81 MG chewable tablet Chew 81 mg by mouth every evening.   Yes Historical Provider, MD  atorvastatin (LIPITOR) 40 MG tablet Take 40 mg by mouth at bedtime.   Yes Historical Provider, MD  chlorpheniramine (CHLOR-TRIMETON) 4 MG tablet Take 4 mg by mouth at bedtime.    Yes Historical Provider, MD  Ergocalciferol (VITAMIN D2) 2000 units TABS Take 1 tablet by mouth daily.   Yes Historical Provider, MD  furosemide (LASIX) 40 MG tablet Take 40 mg by mouth daily before breakfast.    Yes Historical Provider, MD  metoprolol tartrate (LOPRESSOR) 25 MG tablet Take 25 mg by mouth 2 (two) times daily.   Yes Historical Provider, MD  Multiple Vitamins-Minerals (CENTRUM SILVER PO) Take 1 tablet by mouth daily.   Yes Historical Provider, MD  valsartan (DIOVAN) 160 MG tablet Take 160 mg by mouth daily.   Yes Historical Provider, MD  benzonatate (TESSALON) 100 MG capsule Take 1-2 capsules (100-200 mg total) by mouth 2 (two) times daily as needed for cough. 05/05/16   Robyn Haber, MD  levofloxacin (LEVAQUIN) 500 MG tablet Take 1 tablet (500 mg total) by mouth daily. 05/05/16   Robyn Haber, MD  nitroGLYCERIN (NITROSTAT) 0.4 MG SL tablet Place 1 tablet (0.4 mg total) under the tongue as needed. For chest pain 12/05/14   Belva Crome, MD  Polyethyl Glycol-Propyl Glycol (SYSTANE OP) Place 1 drop into the left eye daily as needed. Dry eyes    Historical  Provider, MD    Family History Family History  Problem Relation Age of Onset  . Hypertension Mother   . Cancer Father     Social History Social History  Substance Use Topics  . Smoking status: Never Smoker  . Smokeless tobacco: Never Used  . Alcohol use No     Allergies   Penicillinase; Penicillins; Tramadol; Zestril [lisinopril]; Neomycin; Codeine; Erythromycin; Hydromorphone; and Percocet [oxycodone-acetaminophen]   Review of Systems Review of Systems  Constitutional: Negative.   HENT: Positive for congestion, ear pain and sinus pressure.   Respiratory: Positive for cough.   Cardiovascular:  Negative.   Gastrointestinal: Negative.   Neurological: Negative.   Psychiatric/Behavioral: Negative.      Physical Exam Triage Vital Signs ED Triage Vitals  Enc Vitals Group     BP 05/05/16 1223 157/77     Pulse Rate 05/05/16 1223 67     Resp 05/05/16 1223 16     Temp 05/05/16 1223 98.7 F (37.1 C)     Temp src --      SpO2 05/05/16 1223 96 %     Weight --      Height --      Head Circumference --      Peak Flow --      Pain Score 05/05/16 1226 0     Pain Loc --      Pain Edu? --      Excl. in [place]? --    No data found.   Updated Vital Signs BP 157/77 (BP Location: Right Arm)   Pulse 67   Temp 98.7 F (37.1 C)   Resp 16   SpO2 96%    Physical Exam  Constitutional: She is oriented to person, place, and time. She appears well-developed and well-nourished.  HENT:  Head: Normocephalic.  Right Ear: External ear normal.  Left Ear: External ear normal.  Posterior pharyngeal erythema Left ear has serous fluid that made the TM opaque  Eyes: Conjunctivae and EOM are normal.  Neck: Normal range of motion. Neck supple.  Cardiovascular: Normal rate, regular rhythm and normal heart sounds.   Pulmonary/Chest: Effort normal. She has rales.  Musculoskeletal: Normal range of motion.  Lymphadenopathy:    She has no cervical adenopathy.  Neurological: She is alert and  oriented to person, place, and time.  Skin: Skin is warm and dry.  Nursing note and vitals reviewed.    UC Treatments / Results  Labs (all labs ordered are listed, but only abnormal results are displayed) Labs Reviewed - No data to display  EKG  EKG Interpretation None       Radiology No results found.  Procedures Procedures (including critical care time)  Medications Ordered in UC Medications - No data to display   Initial Impression / Assessment and Plan / UC Course  I have reviewed the triage vital signs and the nursing notes.  Pertinent labs & imaging results that were available during my care of the patient were reviewed by me and considered in my medical decision making (see chart for details).  Clinical Course      Final Clinical Impressions(s) / UC Diagnoses   Final diagnoses:  Acute bronchitis, unspecified organism  Acute maxillary sinusitis, recurrence not specified  Acute serous otitis media of left ear, recurrence not specified    New Prescriptions New Prescriptions   BENZONATATE (TESSALON) 100 MG CAPSULE    Take 1-2 capsules (100-200 mg total) by mouth 2 (two) times daily as needed for cough.   LEVOFLOXACIN (LEVAQUIN) 500 MG TABLET    Take 1 tablet (500 mg total) by mouth daily.     Robyn Haber, MD 05/05/16 1242

## 2016-05-15 ENCOUNTER — Encounter: Payer: Self-pay | Admitting: Interventional Cardiology

## 2016-05-23 ENCOUNTER — Ambulatory Visit (INDEPENDENT_AMBULATORY_CARE_PROVIDER_SITE_OTHER): Payer: Medicare Other | Admitting: Interventional Cardiology

## 2016-05-23 ENCOUNTER — Encounter: Payer: Self-pay | Admitting: Interventional Cardiology

## 2016-05-23 VITALS — BP 150/98 | HR 68 | Ht 61.5 in | Wt 233.0 lb

## 2016-05-23 DIAGNOSIS — I10 Essential (primary) hypertension: Secondary | ICD-10-CM

## 2016-05-23 DIAGNOSIS — D869 Sarcoidosis, unspecified: Secondary | ICD-10-CM

## 2016-05-23 DIAGNOSIS — I2510 Atherosclerotic heart disease of native coronary artery without angina pectoris: Secondary | ICD-10-CM

## 2016-05-23 DIAGNOSIS — E785 Hyperlipidemia, unspecified: Secondary | ICD-10-CM

## 2016-05-23 NOTE — Patient Instructions (Signed)
Medication Instructions:  No changes to current medications.  Labwork: None ordered  Testing/Procedures: None ordered  Follow-Up: Your physician wants you to follow-up in 1 year with Dr. [name]. You will receive a reminder letter in the mail two months in advance. If you don't receive a letter, please call our office to schedule the follow-up appointment.   Any Other Special Instructions Will Be Listed Below (If Applicable).     If you need a refill on your cardiac medications before your next appointment, please call your pharmacy.

## 2016-05-23 NOTE — Progress Notes (Signed)
Cardiology Office Note    Date:  05/23/2016   ID:  Isabella, [name] July 03, 1940, MRN [id number]  PCP:  Marjorie Smolder, MD  Cardiologist: Sinclair Grooms, MD   Chief Complaint  Patient presents with  . Coronary Artery Disease    History of Present Illness:  Isabella [name] is a 76 y.o. female with history of acute ST elevation inferior myocardial infarction in 2012 with right coronary DES 2 (proximal for catheter-induced dissection and distal to treat acute lesion), hypertension, and hyperlipidemia.  Well from cardiac standpoint. She denies chest pain. No orthopnea, PND, or other complaints. No medication side effects. Her husband died within the past 12 months. She has never needed use nitroglycerin. She denies lower extremity swelling. Since stenting she has had no recurrence of any cardiac  Past Medical History:  Diagnosis Date  . Arthritis   . Complication of anesthesia    Took a while to wake up with knee  . Coronary artery disease   . Dyspnea    with walking at times  . Fallen bladder   . GERD (gastroesophageal reflux disease)   . 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 Headache    migraines in her younger years  . Hemorrhoids   . High cholesterol   . History of blood transfusion   . Hx of adenomatous colonic polyps   . Hypertension   . Lung nodule   . Myocardial infarct 03/24/2011   Dr. [name]  . Nocturnal leg cramps   . Obesity   . Sarcoidosis ([place])   . Sleep apnea    uses cpap    Past Surgical History:  Procedure Laterality Date  . ABDOMINAL HYSTERECTOMY  1990  . APPENDECTOMY  1970  . BACK SURGERY  2015   lower back  . CARDIAC CATHETERIZATION  03/2011  . CARPAL TUNNEL RELEASE Bilateral 2004  . CATARACT EXTRACTION    . CHOLECYSTECTOMY  1970  . COLONOSCOPY W/ BIOPSIES AND POLYPECTOMY    . CORONARY ANGIOPLASTY  2012  . CORONARY STENT PLACEMENT  03/24/2011   Dr. [name]  .  EYE SURGERY  2008,2011   corneal transplant  . FEMORAL ARTERY EXPLORATION  03/25/2011   Procedure: FEMORAL ARTERY EXPLORATION;  Surgeon: Elam Dutch, MD;  Location: [facility];  Service: Vascular;  Laterality: Right;  Evacuation of Hematoma   . hematoma surgery     from cardiac cath -hematoma in groin-right  . HERNIA REPAIR  2330   umbilical  . JOINT REPLACEMENT    . KNEE ARTHROSCOPY  2006   Right  . [facility] Left Eye  2012  . LEFT HEART CATHETERIZATION WITH CORONARY ANGIOGRAM N/A 03/24/2011   Procedure: LEFT HEART CATHETERIZATION WITH CORONARY ANGIOGRAM;  Surgeon: Sinclair Grooms, MD;  Location: [facility] CATH LAB;  Service: Cardiovascular;  Laterality: N/A;  . PERCUTANEOUS CORONARY STENT INTERVENTION (PCI-S) N/A 03/24/2011   Procedure: PERCUTANEOUS CORONARY STENT INTERVENTION (PCI-S);  Surgeon: Sinclair Grooms, MD;  Location: [facility] CATH LAB;  Service: Cardiovascular;  Laterality: N/A;  . SHOULDER SURGERY  2010  . TONSILLECTOMY  1949  . TONSILLECTOMY AND ADENOIDECTOMY    . TOTAL KNEE ARTHROPLASTY  07/2010   Left  . TOTAL KNEE ARTHROPLASTY  06/17/2012   Procedure: TOTAL KNEE ARTHROPLASTY;  Surgeon: Gearlean Alf, MD;  [facility]: [facility];  Service: Orthopedics;  Laterality: Right;  . TUBAL LIGATION  5170  . UMBILICAL HERNIA REPAIR  1990, 2007   x 2  . VIDEO BRONCHOSCOPY WITH ENDOBRONCHIAL NAVIGATION  04/13/2012   Procedure: VIDEO BRONCHOSCOPY WITH ENDOBRONCHIAL NAVIGATION;  Surgeon: Melrose Nakayama, MD;  Location: [facility];  Service: Thoracic;  Laterality: N/A;  . VIDEO BRONCHOSCOPY WITH ENDOBRONCHIAL NAVIGATION N/A 04/08/2016   Procedure: VIDEO BRONCHOSCOPY WITH ENDOBRONCHIAL NAVIGATION;  Surgeon: Rigoberto Noel, MD;  Location: [facility];  Service: Thoracic;  Laterality: N/A;    Current Medications: Outpatient Medications Prior to Visit  Medication Sig Dispense Refill  . Ascorbic Acid (VITAMIN C ADULT GUMMIES) 125 MG CHEW Chew 2 capsules by mouth daily. Takes 2 chewables daily    .  aspirin 81 MG chewable tablet Chew 81 mg by mouth every evening.    Marland Kitchen atorvastatin (LIPITOR) 40 MG tablet Take 40 mg by mouth at bedtime.    . chlorpheniramine (CHLOR-TRIMETON) 4 MG tablet Take 4 mg by mouth at bedtime.     . Ergocalciferol (VITAMIN D2) 2000 units TABS Take 1 tablet by mouth daily.    . furosemide (LASIX) 40 MG tablet Take 40 mg by mouth daily before breakfast.     . metoprolol tartrate (LOPRESSOR) 25 MG tablet Take 25 mg by mouth 2 (two) times daily.    . Multiple Vitamins-Minerals (CENTRUM SILVER PO) Take 1 tablet by mouth daily.    . nitroGLYCERIN (NITROSTAT) 0.4 MG SL tablet Place 1 tablet (0.4 mg total) under the tongue as needed. For chest pain 25 tablet 3  . Polyethyl Glycol-Propyl Glycol (SYSTANE OP) Place 1 drop into the left eye daily as needed. Dry eyes    . valsartan (DIOVAN) 160 MG tablet Take 160 mg by mouth daily.    . benzonatate (TESSALON) 100 MG capsule Take 1-2 capsules (100-200 mg total) by mouth 2 (two) times daily as needed for cough. (Patient not taking: Reported on 05/23/2016) 20 capsule 0  . levofloxacin (LEVAQUIN) 500 MG tablet Take 1 tablet (500 mg total) by mouth daily. (Patient not taking: Reported on 05/23/2016) 7 tablet 0   No facility-administered medications prior to visit.      Allergies:   Penicillinase; Penicillins; Tramadol; Zestril [lisinopril]; Neomycin; Codeine; Erythromycin; Hydromorphone; and Percocet [oxycodone-acetaminophen]   Social History   Social History  . Marital status: Widowed    Spouse name: N/A  . Number of children: N/A  . Years of education: N/A   Occupational History  . Retired Retired   Social History Main Topics  . Smoking status: Never Smoker  . Smokeless tobacco: Never Used  . Alcohol use No  . Drug use: No  . Sexual activity: Not Asked   Other Topics Concern  . None   Social History Narrative   Tobacco use cigarettes: never smoked, tobacco history last updated 12/23/2013. No smoking. No alcohol.  Caffeine: yes, coffee, 3 servings daily.recreational drug use: never. No diet. Exercise: walks everyday. Occupation: retired. Marital status: married. Children: boys, 2 girls, 2. Seat belt use: yes.     Family History:  The patient's family history includes Cancer in her father; Hypertension in her mother.   ROS:   Please see the history of present illness.    Cough, abdominal discomfort, blood in stool, diarrhea (seeing Dr. Michail Sermon), occasional leg swelling, easy bruising. Has had back surgery. Gait is somewhat impaired after surgery.  All other systems reviewed and are negative.   PHYSICAL EXAM:   VS:  BP (!) 150/98 (BP Location: Right Arm)   Pulse 68  Ht 5' 1.5" (1.562 m)   Wt 233 lb (105.7 kg)   BMI 43.31 kg/m    GEN: Well nourished, well developed, in no acute distress  HEENT: normal  Neck: no JVD, carotid bruits, or masses Cardiac: RRR; no murmurs, rubs, or gallops,no edema  Respiratory:  clear to auscultation bilaterally, normal work of breathing GI: soft, nontender, nondistended, + BS MS: no deformity or atrophy  Skin: warm and dry, no rash Neuro:  Alert and Oriented x 3, Strength and sensation are intact Psych: euthymic mood, full affect  Wt Readings from Last 3 Encounters:  05/23/16 233 lb (105.7 kg)  04/08/16 235 lb (106.6 kg)  04/03/16 235 lb (106.6 kg)      Studies/Labs Reviewed:   EKG:  EKG  Performed 04/03/2016 demonstrated normal sinus rhythm, first-degree AV block, and otherwise unremarkable tracing.  Recent Labs: 04/03/2016: BUN 12; Creatinine, Ser 0.79; Hemoglobin 13.9; Platelets 228; Potassium 4.5; Sodium 141   Lipid Panel    Component Value Date/Time   CHOL 179 03/24/2011 1742   TRIG 105 03/24/2011 1742   HDL 73 03/24/2011 1742   CHOLHDL 2.5 03/24/2011 1742   VLDL 21 03/24/2011 1742   LDLCALC 85 03/24/2011 1742    Additional studies/ records that were reviewed today include:  No new data.  History of inferior ST elevation myocardial  infarction. Cardiac catheterization resulting in stenting of the right coronary artery.  ASSESSMENT:    1. Atherosclerosis of native coronary artery of native heart without angina pectoris   2. Essential hypertension   3. Hyperlipidemia LDL goal <70   4. Sarcoidosis ([place])      PLAN:  In order of problems listed above:  1. Coronary artery disease is stable. She is followed clinically by her primary physician Dr. [name]. No specific change in therapy is needed. Plan clinical follow-up in one year. 2. Blood pressures mildly elevated. Target should be less than 140/90 mm mercury. If adjustment as necessary I would recommend increasing valsartan to the next which will be 320 mg daily. 3. Lipids should be followed and treated to keep LDL less than 70.    Medication Adjustments/Labs and Tests Ordered: Current medicines are reviewed at length with the patient today.  Concerns regarding medicines are outlined above.  Medication changes, Labs and Tests ordered today are listed in the Patient Instructions below. Patient Instructions  Medication Instructions:  No changes to current medications.  Labwork: None ordered  Testing/Procedures: None ordered  Follow-Up: Your physician wants you to follow-up in 1 year with Dr. [name]. You will receive a reminder letter in the mail two months in advance. If you don't receive a letter, please call our office to schedule the follow-up appointment.   Any Other Special Instructions Will Be Listed Below (If Applicable).     If you need a refill on your cardiac medications before your next appointment, please call your pharmacy.      Signed, Sinclair Grooms, MD  05/23/2016 3:55 PM    [address], [place], [place]  [address] Phone: [phone]; Fax: [phone]

## 2016-06-17 ENCOUNTER — Emergency Department (HOSPITAL_COMMUNITY)
Admission: EM | Admit: 2016-06-17 | Discharge: 2016-06-18 | Disposition: A | Discharge status: Home / Self Care | Payer: Medicare Other | Attending: Emergency Medicine | Admitting: Emergency Medicine

## 2016-06-17 ENCOUNTER — Encounter (HOSPITAL_COMMUNITY): Payer: Self-pay | Admitting: Emergency Medicine

## 2016-06-17 ENCOUNTER — Emergency Department (HOSPITAL_COMMUNITY): Payer: Medicare Other

## 2016-06-17 DIAGNOSIS — Z955 Presence of coronary angioplasty implant and graft: Secondary | ICD-10-CM | POA: Diagnosis not present

## 2016-06-17 DIAGNOSIS — I252 Old myocardial infarction: Secondary | ICD-10-CM | POA: Diagnosis not present

## 2016-06-17 DIAGNOSIS — I2510 Atherosclerotic heart disease of native coronary artery without angina pectoris: Secondary | ICD-10-CM | POA: Insufficient documentation

## 2016-06-17 DIAGNOSIS — I10 Essential (primary) hypertension: Secondary | ICD-10-CM | POA: Diagnosis not present

## 2016-06-17 DIAGNOSIS — Z96653 Presence of artificial knee joint, bilateral: Secondary | ICD-10-CM | POA: Insufficient documentation

## 2016-06-17 DIAGNOSIS — R1012 Left upper quadrant pain: Secondary | ICD-10-CM | POA: Diagnosis present

## 2016-06-17 DIAGNOSIS — K5732 Diverticulitis of large intestine without perforation or abscess without bleeding: Secondary | ICD-10-CM | POA: Diagnosis not present

## 2016-06-17 DIAGNOSIS — Z7982 Long term (current) use of aspirin: Secondary | ICD-10-CM | POA: Insufficient documentation

## 2016-06-17 DIAGNOSIS — Z79899 Other long term (current) drug therapy: Secondary | ICD-10-CM | POA: Insufficient documentation

## 2016-06-17 LAB — CBC WITH DIFFERENTIAL/PLATELET
BASOS ABS: 0 10*3/uL (ref 0.0–0.1)
Basophils Relative: 0 %
EOS ABS: 0.2 10*3/uL (ref 0.0–0.7)
EOS PCT: 4 %
HCT: 39.2 % (ref 36.0–46.0)
Hemoglobin: 12.6 g/dL (ref 12.0–15.0)
Lymphocytes Relative: 20 %
Lymphs Abs: 1.3 10*3/uL (ref 0.7–4.0)
MCH: 29.9 pg (ref 26.0–34.0)
MCHC: 32.1 g/dL (ref 30.0–36.0)
MCV: 92.9 fL (ref 78.0–100.0)
MONO ABS: 1 10*3/uL (ref 0.1–1.0)
Monocytes Relative: 15 %
Neutro Abs: 4.1 10*3/uL (ref 1.7–7.7)
Neutrophils Relative %: 61 %
PLATELETS: 260 10*3/uL (ref 150–400)
RBC: 4.22 MIL/uL (ref 3.87–5.11)
RDW: 15.7 % — AB (ref 11.5–15.5)
WBC: 6.7 10*3/uL (ref 4.0–10.5)

## 2016-06-17 LAB — COMPREHENSIVE METABOLIC PANEL
ALK PHOS: 95 U/L (ref 38–126)
ALT: 14 U/L (ref 14–54)
AST: 21 U/L (ref 15–41)
Albumin: 3 g/dL — ABNORMAL LOW (ref 3.5–5.0)
Anion gap: 8 (ref 5–15)
BILIRUBIN TOTAL: 0.5 mg/dL (ref 0.3–1.2)
BUN: 8 mg/dL (ref 6–20)
CO2: 25 mmol/L (ref 22–32)
CREATININE: 0.8 mg/dL (ref 0.44–1.00)
Calcium: 8.6 mg/dL — ABNORMAL LOW (ref 8.9–10.3)
Chloride: 107 mmol/L (ref 101–111)
GFR calc Af Amer: >60 mL/min (ref >60)
Glucose, Bld: 102 mg/dL — ABNORMAL HIGH (ref 65–99)
Potassium: 3.7 mmol/L (ref 3.5–5.1)
Sodium: 140 mmol/L (ref 135–145)
TOTAL PROTEIN: 5.5 g/dL — AB (ref 6.5–8.1)

## 2016-06-17 MED ORDER — PIPERACILLIN-TAZOBACTAM 3.375 G IVPB 30 MIN
3.3750 g | Freq: Once | INTRAVENOUS | Status: AC
  Administered 2016-06-17: 3.375 g via INTRAVENOUS
  Filled 2016-06-17: qty 50

## 2016-06-17 MED ORDER — IOPAMIDOL (ISOVUE-300) INJECTION 61%
INTRAVENOUS | Status: AC
  Administered 2016-06-17: 100 mL
  Filled 2016-06-17: qty 100

## 2016-06-17 MED ORDER — CIPROFLOXACIN HCL 500 MG PO TABS: 500.0000 mg | ORAL_TABLET | Freq: Two times a day (BID) | ORAL | 0 refills | Status: DC

## 2016-06-17 MED ORDER — SODIUM CHLORIDE 0.9 % IV BOLUS (SEPSIS)
500.0000 mL | Freq: Once | INTRAVENOUS | Status: AC
  Administered 2016-06-17: 500 mL via INTRAVENOUS

## 2016-06-17 MED ORDER — PIPERACILLIN-TAZOBACTAM 3.375 G IVPB: 3.3750 g | Freq: Three times a day (TID) | INTRAVENOUS | Status: DC

## 2016-06-17 MED ORDER — METRONIDAZOLE 500 MG PO TABS: 500.0000 mg | ORAL_TABLET | Freq: Two times a day (BID) | ORAL | 0 refills | Status: DC

## 2016-06-17 MED ORDER — SODIUM CHLORIDE 0.9 % IV SOLN
INTRAVENOUS | Status: DC
  Administered 2016-06-17: 19:00:00 via INTRAVENOUS

## 2016-06-17 NOTE — Discharge Instructions (Signed)
Call your gastroenterologist, Dr. Michail Sermon for follow-up appointment in 3 days. Tell him that we wanted to have you evaluated for diverticulitis of the descending and sigmoid colons.  Stay on a low fiber diet.  Stop taking the cholestyramine, for now

## 2016-06-17 NOTE — ED Triage Notes (Signed)
abd pain diarrhea and  Rectal bleeding  For a while but got worse last couple of days  States sometimes all red  blood and  Sometimes blood and stool

## 2016-06-17 NOTE — ED Notes (Signed)
Pt reports having C.Diff cultures and cholitis cultures done in January that all came back negative.

## 2016-06-17 NOTE — ED Notes (Signed)
ED Provider at bedside. 

## 2016-06-17 NOTE — ED Notes (Signed)
Pt reports being on a powder since January 27th 2X daily for diarrhea relief. Thursday pt reports having diarrhea X20. Pt reports between 0830 today and 1100 she went X8 diarrhea.

## 2016-06-17 NOTE — ED Provider Notes (Signed)
[facility] DEPT Provider Note   CSN: [id number] Arrival date & time: 06/17/16  1335     History   Chief Complaint Chief Complaint  Patient presents with  . Abdominal Pain  . Diarrhea  . Rectal Bleeding    HPI Isabella [name] is a 76 y.o. female.  She presents for evaluation of chronic diarrhea worse the last several days, and not responding to cholestyramine. She was prescribed cholestyramine, one week ago, by her PCP, and is taking it as directed. She has had no relief from the treatment. She continues to have stooling, loose, as many as 20 per day, with blood mixed in. She has also passed some blood clots. He complains of left upper quadrant abdominal pain. She has a history of diverticulosis, but not diverticulitis. She has had colonoscopy with polypectomy, without finding for cancer. She is followed closely by gastroenterology. She is taking all of her medications as directed. Her appetite has been good. She has not had any fever, chills, nausea or vomiting. There are no other known modifying factors.  HPI  Past Medical History:  Diagnosis Date  . Arthritis   . Complication of anesthesia    Took a while to wake up with knee  . Coronary artery disease   . Dyspnea    with walking at times  . Fallen bladder   . GERD (gastroesophageal reflux disease)   . 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 Headache    migraines in her younger years  . Hemorrhoids   . High cholesterol   . History of blood transfusion   . Hx of adenomatous colonic polyps   . Hypertension   . Lung nodule   . Myocardial infarct 03/24/2011   Dr. [name]  . Nocturnal leg cramps   . Obesity   . Sarcoidosis ([place])   . Sleep apnea    uses cpap    Patient Active Problem List   Diagnosis Date Noted  . Degenerative spondylolisthesis 02/01/2014  . Sarcoidosis ([place]) 07/14/2013  . Abnormal CT of the chest 04/21/2013  . Coronary  atherosclerosis of native coronary artery 03/29/2013    Class: Chronic  . Old MI (myocardial infarction) 03/29/2013    Class: Chronic  . OA (osteoarthritis) of knee 06/17/2012  . Lung nodule 06/03/2012  . DJD (degenerative joint disease) 06/01/2012  . 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 Edema of lower extremity 03/29/2011  . Anemia associated with acute blood loss 03/25/2011  . Hematoma following PCI 03/24/2011  . Obesity (BMI 30-39.9)   . Obstructive sleep apnea 01/25/2009  . Hyperlipidemia 01/24/2009  . Essential hypertension 01/24/2009    Past Surgical History:  Procedure Laterality Date  . ABDOMINAL HYSTERECTOMY  1990  . APPENDECTOMY  1970  . BACK SURGERY  2015   lower back  . CARDIAC CATHETERIZATION  03/2011  . CARPAL TUNNEL RELEASE Bilateral 2004  . CATARACT EXTRACTION    . CHOLECYSTECTOMY  1970  . COLONOSCOPY W/ BIOPSIES AND POLYPECTOMY    . CORONARY ANGIOPLASTY  2012  . CORONARY STENT PLACEMENT  03/24/2011   Dr. [name]  . EYE SURGERY  2008,2011   corneal transplant  . FEMORAL ARTERY EXPLORATION  03/25/2011   Procedure: FEMORAL ARTERY EXPLORATION;  Surgeon: Elam Dutch, MD;  Location: [facility];  Service: Vascular;  Laterality: Right;  Evacuation of Hematoma   . hematoma surgery     from cardiac cath -  hematoma in groin-right  . HERNIA REPAIR  9233   umbilical  . JOINT REPLACEMENT    . KNEE ARTHROSCOPY  2006   Right  . [facility] Left Eye  2012  . LEFT HEART CATHETERIZATION WITH CORONARY ANGIOGRAM N/A 03/24/2011   Procedure: LEFT HEART CATHETERIZATION WITH CORONARY ANGIOGRAM;  Surgeon: Sinclair Grooms, MD;  Location: [facility] CATH LAB;  Service: Cardiovascular;  Laterality: N/A;  . PERCUTANEOUS CORONARY STENT INTERVENTION (PCI-S) N/A 03/24/2011   Procedure: PERCUTANEOUS CORONARY STENT INTERVENTION (PCI-S);  Surgeon: Sinclair Grooms, MD;  Location[facility] CATH LAB;  Service: Cardiovascular;  Laterality: N/A;  . SHOULDER SURGERY  2010  . TONSILLECTOMY  1949  .  TONSILLECTOMY AND ADENOIDECTOMY    . TOTAL KNEE ARTHROPLASTY  07/2010   Left  . TOTAL KNEE ARTHROPLASTY  06/17/2012   Procedure: TOTAL KNEE ARTHROPLASTY;  Surgeon: Gearlean Alf, MD;  Location: [facility];  Service: Orthopedics;  Laterality: Right;  . TUBAL LIGATION  1978  . [place], 2007   x 2  . VIDEO BRONCHOSCOPY WITH ENDOBRONCHIAL NAVIGATION  04/13/2012   Procedure: VIDEO BRONCHOSCOPY WITH ENDOBRONCHIAL NAVIGATION;  Surgeon: Melrose Nakayama, MD;  Location: [facility];  Service: Thoracic;  Laterality: N/A;  . VIDEO BRONCHOSCOPY WITH ENDOBRONCHIAL NAVIGATION N/A 04/08/2016   Procedure: VIDEO BRONCHOSCOPY WITH ENDOBRONCHIAL NAVIGATION;  Surgeon: Rigoberto Noel, MD;  Location: [facility];  Service: Thoracic;  Laterality: N/A;    OB History    No data available       Home Medications    Prior to Admission medications   Medication Sig Start Date End Date Taking? Authorizing Provider  Ascorbic Acid (VITAMIN C ADULT GUMMIES) 125 MG CHEW Chew 2 tablets by mouth daily.    Yes Historical Provider, MD  aspirin 81 MG chewable tablet Chew 81 mg by mouth daily at 3 pm.    Yes Historical Provider, MD  atorvastatin (LIPITOR) 40 MG tablet Take 40 mg by mouth at bedtime.   Yes Historical Provider, MD  chlorpheniramine (CHLOR-TRIMETON) 4 MG tablet Take 4 mg by mouth at bedtime.    Yes Historical Provider, MD  cholecalciferol (VITAMIN D) 1000 units tablet Take 2,000 Units by mouth daily.   Yes Historical Provider, MD  furosemide (LASIX) 40 MG tablet Take 40 mg by mouth daily.    Yes Historical Provider, MD  ibuprofen (ADVIL,MOTRIN) 200 MG tablet Take 200 mg by mouth daily as needed (pain).   Yes Historical Provider, MD  metoprolol tartrate (LOPRESSOR) 25 MG tablet Take 25 mg by mouth See admin instructions. Take 1 tablet (25 mg) by mouth every morning and at 3pm   Yes Historical Provider, MD  Multiple Vitamin (MULTIVITAMIN WITH MINERALS) TABS tablet Take 1 tablet by mouth daily.   Yes  Historical Provider, MD  nitroGLYCERIN (NITROSTAT) 0.4 MG SL tablet Place 1 tablet (0.4 mg total) under the tongue as needed. For chest pain Patient taking differently: Place 0.4 mg under the tongue every 5 (five) minutes as needed for chest pain. For chest pain 12/05/14  Yes Belva Crome, MD  PRESCRIPTION MEDICATION Inhale into the lungs See admin instructions. Use CPAP at night and whenever taking a nap   Yes Historical Provider, MD  valsartan (DIOVAN) 160 MG tablet Take 160 mg by mouth daily.   Yes Historical Provider, MD  ciprofloxacin (CIPRO) 500 MG tablet Take 1 tablet (500 mg total) by mouth 2 (two) times daily. One po bid x 7 days 06/17/16  Daleen Bo, MD  metroNIDAZOLE (FLAGYL) 500 MG tablet Take 1 tablet (500 mg total) by mouth 2 (two) times daily. One po bid x 7 days 06/17/16   Daleen Bo, MD  Polyethyl Glycol-Propyl Glycol (SYSTANE OP) Place 1 drop into the left eye daily as needed (dry eyes).     Historical Provider, MD    Family History Family History  Problem Relation Age of Onset  . Hypertension Mother   . Cancer Father     Social History Social History  Substance Use Topics  . Smoking status: Never Smoker  . Smokeless tobacco: Never Used  . Alcohol use No     Allergies   Penicillins; Tramadol; Zestril [lisinopril]; Neomycin; Codeine; Erythromycin; Hydromorphone; and Percocet [oxycodone-acetaminophen]   Review of Systems Review of Systems  All other systems reviewed and are negative.    Physical Exam Updated Vital Signs BP (!) 112/53   Pulse 82   Temp 98.7 F (37.1 C) (Oral)   Resp 20   Ht 5' 1.5" (1.562 m)   Wt 230 lb (104.3 kg)   SpO2 97%   BMI 42.75 kg/m   Physical Exam  Constitutional: She is oriented to person, place, and time. She appears well-developed.  Elderly, frail  HENT:  Head: Normocephalic and atraumatic.  Eyes: Conjunctivae and EOM are normal. Pupils are equal, round, and reactive to light.  Neck: Normal range of motion and  phonation normal. Neck supple.  Cardiovascular: Normal rate and regular rhythm.   Pulmonary/Chest: Effort normal and breath sounds normal. She exhibits no tenderness.  Abdominal: Soft. She exhibits no distension. There is tenderness (Left upper quadrant, mild). There is no rebound and no guarding.  Musculoskeletal: Normal range of motion.  Neurological: She is alert and oriented to person, place, and time. She exhibits normal muscle tone.  Skin: Skin is warm and dry.  Psychiatric: She has a normal mood and affect. Her behavior is normal. Judgment and thought content normal.  Nursing note and vitals reviewed.    ED Treatments / Results  Labs (all labs ordered are listed, but only abnormal results are displayed) Labs Reviewed  COMPREHENSIVE METABOLIC PANEL - Abnormal; Notable for the following:       Result Value   Glucose, Bld 102 (*)    Calcium 8.6 (*)    Total Protein 5.5 (*)    Albumin 3.0 (*)    All other components within normal limits  CBC WITH DIFFERENTIAL/PLATELET - Abnormal; Notable for the following:    RDW 15.7 (*)    All other components within normal limits  POC OCCULT BLOOD, ED  POC OCCULT BLOOD, ED    EKG  EKG Interpretation None       Radiology Ct Abdomen Pelvis W Contrast  Result Date: 06/17/2016 CLINICAL DATA:  Rectal bleeding, weight loss, diarrhea for 1 month. EXAM: CT ABDOMEN AND PELVIS WITH CONTRAST TECHNIQUE: Multidetector CT imaging of the abdomen and pelvis was performed using the standard protocol following bolus administration of intravenous contrast. CONTRAST:  151m ISOVUE-300 IOPAMIDOL (ISOVUE-300) INJECTION 61% COMPARISON:  Nuclear medicine PET-CT from 02/07/2016 FINDINGS: Lower chest: 1.5 by 1.5 cm medial right lower lobe pulmonary nodule on image 7/5, previously hypermetabolic on PET-CT. Elongated nodule in the left lower lobe measuring 8 mm in short axis on image 5/5, stable from the prior CT chest, and not appreciably hypermetabolic on prior  PET-CT. Several additional pulmonary nodules are present scattered in the lung bases, for example a stable 5 mm nodule in the right  lower lobe on image 4/5. Fat density along the apical septal left ventricle, probably from a prior myocardial infarction. Small type 1 hiatal hernia. Hepatobiliary: Hypodense 1.3 by 1.1 cm lesion in segment 2 of the liver on image 14/7, similar to prior exams. Hypodense 1.7 by 1.4 cm lesion in segment one of the liver on image 16/7, similar to prior exams. Hypodense 5 mm lesion in segment 4 of the liver on image 18/7, also seen on prior exams. Multiple additional hepatic hypodense lesions are present. Most of these are likely cysts although the smaller lesions are technically nonspecific. The gallbladder is absent. Pancreas: Atrophic but otherwise unremarkable. Spleen: Unremarkable Adrenals/Urinary Tract: Multiple hypodense bilateral renal lesions are present, some of these are technically too small to characterize. Some are partially obscured by beam hardening related to the patient's back hardware. I cannot exclude complexity in the smaller lesions such as the 7 mm right mid kidney lesion on image 112/3. Stomach/Bowel: There is abnormal colonic wall thickening with its epicenter in the sigmoid colon, although also involving the descending colon and transverse colon. The involvement of the other portions of the colon suggest colitis rather than sigmoid diverticulitis. The central mesenteric vessels are patent and I would favor infectious colitis over ischemic colitis. Inflammatory bowel disease is possible but less likely. There is no extraluminal gas or abscess. Scattered small mesenteric lymph nodes are noted specially adjacent to the proximal sigmoid colon. Vascular/Lymphatic: Minimal aortoiliac atherosclerosis. No pathologic adenopathy. Reproductive: Uterus absent.  Adnexa unremarkable. Other: No supplemental non-categorized findings. Musculoskeletal: Posterolateral rod and pedicle  screw fixation at L3-L4-L5 with solid interbody fusion and no complicating feature related to the fusion identified. Anterior hernia mesh markers noted. IMPRESSION: 1. Abnormal wall thickening of the sigmoid colon, but also the descending and distal transverse colon, favoring infectious colitis. Sigmoid colon is the epicenter and there may be a component of sigmoid diverticulitis given the differential involvement. 2. Scattered hepatic and renal cysts. Some of the rib smaller renal cysts cannot be adequately characterized due to a combination of small size and beam hardening artifact related to the patient' s back heart where. 3. Stable 1.5 cm right lower lobe pulmonary nodule, previously hypermetabolic on PET-CT. 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 Other entities such as pulmonary carcinoid not excluded. Multiple smaller basilar nodules are also present. 4. Small type 1 hiatal hernia. Electronically Signed   By: Van Clines M.D.   On: 06/17/2016 18:39    Procedures Procedures (including critical care time)  Medications Ordered in ED Medications  0.9 %  sodium chloride infusion ( Intravenous Stopped 06/17/16 2355)  piperacillin-tazobactam (ZOSYN) IVPB 3.375 g (not administered)  sodium chloride 0.9 % bolus 500 mL (0 mLs Intravenous Stopped 06/17/16 1850)  iopamidol (ISOVUE-300) 61 % injection (100 mLs  Contrast Given 06/17/16 1809)  piperacillin-tazobactam (ZOSYN) IVPB 3.375 g (0 g Intravenous Stopped 06/17/16 2230)     Initial Impression / Assessment and Plan / ED Course  I have reviewed the triage vital signs and the nursing notes.  Pertinent labs & imaging results that were available during my care of the patient were reviewed by me and considered in my medical decision making (see chart for details).     Medications  0.9 %  sodium chloride infusion (  Intravenous Stopped 06/17/16 2355)  piperacillin-tazobactam (ZOSYN) IVPB 3.375 g (not administered)  sodium chloride 0.9 %  bolus 500 mL (0 mLs Intravenous Stopped 06/17/16 1850)  iopamidol (ISOVUE-300) 61 % injection (100 mLs  Contrast Given 06/17/16 1809)  piperacillin-tazobactam (ZOSYN) IVPB 3.375 g (0 g Intravenous Stopped 06/17/16 2230)    Patient Vitals for the past 24 hrs:  BP Temp Temp src Pulse Resp SpO2 Height Weight  06/17/16 2347 (!) 112/53 - - 82 20 97 % - -  06/17/16 2245 (!) 117/44 - - 81 23 97 % - -  06/17/16 2230 (!) 111/50 - - 81 21 98 % - -  06/17/16 2215 (!) 114/50 - - 84 17 99 % - -  06/17/16 2200 (!) 118/52 - - 83 19 99 % - -  06/17/16 2145 (!) 135/54 - - 81 19 98 % - -  06/17/16 2115 (!) 123/52 - - 78 21 100 % - -  06/17/16 2045 123/65 - - 80 24 99 % - -  06/17/16 2030 (!) 129/53 - - 81 19 100 % - -  06/17/16 2015 135/61 - - 78 14 100 % - -  06/17/16 2000 (!) 129/52 - - 78 20 100 % - -  06/17/16 1845 128/61 - - 79 22 98 % - -  06/17/16 1800 130/63 - - 77 20 99 % - -  06/17/16 1653 145/59 98.7 F (37.1 C) Oral 73 16 100 % - -  06/17/16 1552 (!) 127/52 - - 74 16 100 % - -  06/17/16 1353 139/67 98.9 F (37.2 C) Oral 75 18 98 % 5' 1.5" (1.562 m) 230 lb (104.3 kg)    At D/C Reevaluation with update and discussion. After initial assessment and treatment, an updated evaluation reveals She remains comfortable, no vomiting or diarrhea, since arrival. Fraida Veldman L    Final Clinical Impressions(s) / ED Diagnoses   Final diagnoses:  Diverticulitis of large intestine without perforation or abscess without bleeding    Currently, diarrhea, more recently, now with findings for acute diverticulitis. Doubt serious bacterial infection. Metabolic instability or impending vascular collapse. Patient is nontoxic and has tolerated oral liquids here. She is stable for discharge with outpatient management.  Nursing Notes Reviewed/ Care Coordinated Applicable Imaging  Reviewed Interpretation of Laboratory Data incorporated into ED treatment  The patient appears reasonably screened and/or stabilized for discharge and I doubt any other medical condition or other [facility] requiring further screening, evaluation, or treatment in the ED at this time prior to discharge.  Plan: Home Medications- continue usual, except cholestyramine; Home Treatments- low fiber diet; return here if the recommended treatment, does not improve the symptoms; Recommended follow up- GI follow up in 3 days and PCP in one week.    New Prescriptions Discharge Medication List as of 06/17/2016 11:39 PM    START taking these medications   Details  ciprofloxacin (CIPRO) 500 MG tablet Take 1 tablet (500 mg total) by mouth 2 (two) times daily. One po bid x 7 days, Starting Mon 06/17/2016, Print    metroNIDAZOLE (FLAGYL) 500 MG tablet Take 1 tablet (500 mg total) by mouth 2 (two) times daily. One po bid x 7 days, Starting Mon 06/17/2016, Print         Daleen Bo, MD 06/18/16 0020

## 2016-06-22 ENCOUNTER — Encounter (HOSPITAL_COMMUNITY): Payer: Self-pay

## 2016-06-22 ENCOUNTER — Emergency Department (HOSPITAL_COMMUNITY)
Admission: EM | Admit: 2016-06-22 | Discharge: 2016-06-22 | Disposition: A | Discharge status: Home / Self Care | Payer: Medicare Other | Attending: Emergency Medicine | Admitting: Emergency Medicine

## 2016-06-22 DIAGNOSIS — Z7982 Long term (current) use of aspirin: Secondary | ICD-10-CM | POA: Diagnosis not present

## 2016-06-22 DIAGNOSIS — R197 Diarrhea, unspecified: Secondary | ICD-10-CM | POA: Insufficient documentation

## 2016-06-22 DIAGNOSIS — Z955 Presence of coronary angioplasty implant and graft: Secondary | ICD-10-CM | POA: Insufficient documentation

## 2016-06-22 DIAGNOSIS — I252 Old myocardial infarction: Secondary | ICD-10-CM | POA: Insufficient documentation

## 2016-06-22 DIAGNOSIS — K625 Hemorrhage of anus and rectum: Secondary | ICD-10-CM

## 2016-06-22 DIAGNOSIS — I10 Essential (primary) hypertension: Secondary | ICD-10-CM | POA: Diagnosis not present

## 2016-06-22 DIAGNOSIS — Z96653 Presence of artificial knee joint, bilateral: Secondary | ICD-10-CM | POA: Diagnosis not present

## 2016-06-22 DIAGNOSIS — K644 Residual hemorrhoidal skin tags: Secondary | ICD-10-CM | POA: Diagnosis not present

## 2016-06-22 DIAGNOSIS — E860 Dehydration: Secondary | ICD-10-CM | POA: Diagnosis not present

## 2016-06-22 DIAGNOSIS — I2510 Atherosclerotic heart disease of native coronary artery without angina pectoris: Secondary | ICD-10-CM | POA: Insufficient documentation

## 2016-06-22 LAB — CBC
HCT: 37.7 % (ref 36.0–46.0)
Hemoglobin: 12.2 g/dL (ref 12.0–15.0)
MCH: 29.9 pg (ref 26.0–34.0)
MCHC: 32.4 g/dL (ref 30.0–36.0)
MCV: 92.4 fL (ref 78.0–100.0)
PLATELETS: 301 10*3/uL (ref 150–400)
RBC: 4.08 MIL/uL (ref 3.87–5.11)
RDW: 16.3 % — ABNORMAL HIGH (ref 11.5–15.5)
WBC: 8.7 10*3/uL (ref 4.0–10.5)

## 2016-06-22 LAB — COMPREHENSIVE METABOLIC PANEL
ALBUMIN: 2.7 g/dL — AB (ref 3.5–5.0)
ALK PHOS: 87 U/L (ref 38–126)
ALT: 20 U/L (ref 14–54)
AST: 31 U/L (ref 15–41)
Anion gap: 5 (ref 5–15)
BILIRUBIN TOTAL: 0.3 mg/dL (ref 0.3–1.2)
BUN: 5 mg/dL — ABNORMAL LOW (ref 6–20)
CALCIUM: 8.4 mg/dL — AB (ref 8.9–10.3)
CO2: 26 mmol/L (ref 22–32)
Chloride: 109 mmol/L (ref 101–111)
Creatinine, Ser: 0.84 mg/dL (ref 0.44–1.00)
GFR calc non Af Amer: >60 mL/min (ref >60)
GLUCOSE: 104 mg/dL — AB (ref 65–99)
Potassium: 3.2 mmol/L — ABNORMAL LOW (ref 3.5–5.1)
Sodium: 140 mmol/L (ref 135–145)
TOTAL PROTEIN: 5.5 g/dL — AB (ref 6.5–8.1)

## 2016-06-22 LAB — TYPE AND SCREEN
ABO/RH(D): A POS
Antibody Screen: NEGATIVE

## 2016-06-22 LAB — URINALYSIS, ROUTINE W REFLEX MICROSCOPIC
Bilirubin Urine: NEGATIVE
Glucose, UA: NEGATIVE mg/dL
Hgb urine dipstick: NEGATIVE
Ketones, ur: NEGATIVE mg/dL
NITRITE: NEGATIVE
PH: 5 (ref 5.0–8.0)
Protein, ur: NEGATIVE mg/dL
SPECIFIC GRAVITY, URINE: 1.013 (ref 1.005–1.030)

## 2016-06-22 LAB — POC OCCULT BLOOD, ED: FECAL OCCULT BLD: POSITIVE — AB

## 2016-06-22 LAB — LIPASE, BLOOD: Lipase: 47 U/L (ref 11–51)

## 2016-06-22 MED ORDER — LOPERAMIDE HCL 2 MG PO CAPS: 2.0000 mg | ORAL_CAPSULE | Freq: Four times a day (QID) | ORAL | 0 refills | Status: DC | PRN

## 2016-06-22 MED ORDER — LOPERAMIDE HCL 2 MG PO CAPS
4.0000 mg | ORAL_CAPSULE | ORAL | Status: DC | PRN
  Administered 2016-06-22: 4 mg via ORAL
  Filled 2016-06-22: qty 2

## 2016-06-22 MED ORDER — POTASSIUM CHLORIDE CRYS ER 20 MEQ PO TBCR
40.0000 meq | EXTENDED_RELEASE_TABLET | Freq: Once | ORAL | Status: AC
  Administered 2016-06-22: 40 meq via ORAL
  Filled 2016-06-22: qty 2

## 2016-06-22 MED ORDER — SODIUM CHLORIDE 0.9 % IV BOLUS (SEPSIS)
1000.0000 mL | Freq: Once | INTRAVENOUS | Status: AC
  Administered 2016-06-22: 1000 mL via INTRAVENOUS

## 2016-06-22 NOTE — ED Triage Notes (Signed)
Pt. Was seen on 06/17/2016 for abdominal pain, diarrhea and rectal bleeding.   Pt. Reports that she has increased rectal bleeding. Abdominal cramping and nausea.  Skin is pink, warm and dry.  Alert and oriented X 4.

## 2016-06-22 NOTE — ED Provider Notes (Signed)
[facility] DEPT Provider Note   CSN: [id number] Arrival date & time: 06/22/16  1433     History   Chief Complaint Chief Complaint  Patient presents with  . Abdominal Pain  . Nausea  . Weakness    HPI Isabella [name] is a 76 y.o. female.  HPI Patient presents with worsening rectal bleeding. Patient has had the symptoms since May. She went and saw Dr. Michail Sermon, GI, and had a colonoscopy done in May. I'm unable to see the results of this but patient was told she had diverticulum and hemorrhoids and that she was likely bleeding from these. She had a GI pathogen panel and a C. difficile panel which was negative and sent home with cholestyramine which she has been taking but discontinued after no significant improvement. She has had a CT abdomen and pelvis on the 15th of this month after she developed worsening rectal bleeding with some clots. This showed some questionable sigmoid diverticulitis and she was started on Cipro and Flagyl but she has continued and is on day 5. However, she continues to experience rectal bleeding and it's slightly worse. No black tarry stool but she has continued to have some streaks of blood with intermittent clots. She does not have any abdominal pain. No fevers. The patient denies any vomiting, hematemesis or coffee-ground emesis. She has not passed out but did feel lightheaded today while walking. She does have significant diarrhea but this has improved from 20 times a day to about 12-15 times a day. She held her primary care provider who instructed her to come to the ER. She does have a follow-up appointment with GI on Monday..  Past Medical History:  Diagnosis Date  . Arthritis   . Complication of anesthesia    Took a while to wake up with knee  . Coronary artery disease   . Dyspnea    with walking at times  . Fallen bladder   . GERD (gastroesophageal reflux disease)   . 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 Headache    migraines in her younger years  . Hemorrhoids   . High cholesterol   . History of blood transfusion   . Hx of adenomatous colonic polyps   . Hypertension   . Lung nodule   . Myocardial infarct 03/24/2011   Dr. [name]  . Nocturnal leg cramps   . Obesity   . Sarcoidosis ([place])   . Sleep apnea    uses cpap    Patient Active Problem List   Diagnosis Date Noted  . Degenerative spondylolisthesis 02/01/2014  . Sarcoidosis ([place]) 07/14/2013  . Abnormal CT of the chest 04/21/2013  . Coronary atherosclerosis of native coronary artery 03/29/2013    Class: Chronic  . Old MI (myocardial infarction) 03/29/2013    Class: Chronic  . OA (osteoarthritis) of knee 06/17/2012  . Lung nodule 06/03/2012  . DJD (degenerative joint disease) 06/01/2012  . 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 Edema of lower extremity 03/29/2011  . Anemia associated with acute blood loss 03/25/2011  . Hematoma following PCI 03/24/2011  . Obesity (BMI 30-39.9)   . Obstructive sleep apnea 01/25/2009  . Hyperlipidemia 01/24/2009  . Essential hypertension 01/24/2009    Past Surgical History:  Procedure Laterality Date  . ABDOMINAL HYSTERECTOMY  1990  . APPENDECTOMY  1970  . BACK SURGERY  2015   lower back  . CARDIAC CATHETERIZATION  03/2011  .  CARPAL TUNNEL RELEASE Bilateral 2004  . CATARACT EXTRACTION    . CHOLECYSTECTOMY  1970  . COLONOSCOPY W/ BIOPSIES AND POLYPECTOMY    . CORONARY ANGIOPLASTY  2012  . CORONARY STENT PLACEMENT  03/24/2011   Dr. [name]  . EYE SURGERY  2008,2011   corneal transplant  . FEMORAL ARTERY EXPLORATION  03/25/2011   Procedure: FEMORAL ARTERY EXPLORATION;  Surgeon: Elam Dutch, MD;  Location: [facility];  Service: Vascular;  Laterality: Right;  Evacuation of Hematoma   . hematoma surgery     from cardiac cath -hematoma in groin-right  . HERNIA REPAIR  1937   umbilical  . JOINT REPLACEMENT    . KNEE ARTHROSCOPY  2006   Right  .  [place] [facility] Left Eye  2012  . LEFT HEART CATHETERIZATION WITH CORONARY ANGIOGRAM N/A 03/24/2011   Procedure: LEFT HEART CATHETERIZATION WITH CORONARY ANGIOGRAM;  Surgeon: Sinclair Grooms, MD;  Location: [facility] CATH LAB;  Service: Cardiovascular;  Laterality: N/A;  . PERCUTANEOUS CORONARY STENT INTERVENTION (PCI-S) N/A 03/24/2011   Procedure: PERCUTANEOUS CORONARY STENT INTERVENTION (PCI-S);  Surgeon: Sinclair Grooms, MD;  Location: [facility] CATH LAB;  Service: Cardiovascular;  Laterality: N/A;  . SHOULDER SURGERY  2010  . TONSILLECTOMY  1949  . TONSILLECTOMY AND ADENOIDECTOMY    . TOTAL KNEE ARTHROPLASTY  07/2010   Left  . TOTAL KNEE ARTHROPLASTY  06/17/2012   Procedure: TOTAL KNEE ARTHROPLASTY;  Surgeon: Gearlean Alf, MD;  Location: [facility];  Service: Orthopedics;  Laterality: Right;  . TUBAL LIGATION  1978  . [place], 2007   x 2  . VIDEO BRONCHOSCOPY WITH ENDOBRONCHIAL NAVIGATION  04/13/2012   Procedure: VIDEO BRONCHOSCOPY WITH ENDOBRONCHIAL NAVIGATION;  Surgeon: Melrose Nakayama, MD;  Location: [facility];  Service: Thoracic;  Laterality: N/A;  . VIDEO BRONCHOSCOPY WITH ENDOBRONCHIAL NAVIGATION N/A 04/08/2016   Procedure: VIDEO BRONCHOSCOPY WITH ENDOBRONCHIAL NAVIGATION;  Surgeon: Rigoberto Noel, MD;  Location: [facility];  Service: Thoracic;  Laterality: N/A;    OB History    No data available       Home Medications    Prior to Admission medications   Medication Sig Start Date End Date Taking? Authorizing Provider  acetaminophen (TYLENOL) 325 MG tablet Take 325-650 mg by mouth every 6 (six) hours as needed for mild pain.   Yes Historical Provider, MD  aspirin 81 MG chewable tablet Chew 81 mg by mouth daily at 3 pm.    Yes Historical Provider, MD  atorvastatin (LIPITOR) 40 MG tablet Take 40 mg by mouth at bedtime.   Yes Historical Provider, MD  ciprofloxacin (CIPRO) 500 MG tablet Take 1 tablet (500 mg total) by mouth 2 (two) times daily. One po bid x 7 days 06/17/16  Yes  Daleen Bo, MD  furosemide (LASIX) 40 MG tablet Take 40 mg by mouth daily.    Yes Historical Provider, MD  ibuprofen (ADVIL,MOTRIN) 200 MG tablet Take 200 mg by mouth daily as needed (pain).   Yes Historical Provider, MD  metoprolol tartrate (LOPRESSOR) 25 MG tablet Take 25 mg by mouth See admin instructions. Take 1 tablet (25 mg) by mouth every morning and at 3pm   Yes Historical Provider, MD  metroNIDAZOLE (FLAGYL) 500 MG tablet Take 1 tablet (500 mg total) by mouth 2 (two) times daily. One po bid x 7 days 06/17/16  Yes Daleen Bo, MD  Multiple Vitamin (MULTIVITAMIN WITH MINERALS) TABS tablet Take 1 tablet by mouth daily.  Yes Historical Provider, MD  nitroGLYCERIN (NITROSTAT) 0.4 MG SL tablet Place 1 tablet (0.4 mg total) under the tongue as needed. For chest pain Patient taking differently: Place 0.4 mg under the tongue every 5 (five) minutes as needed for chest pain. For chest pain 12/05/14  Yes Belva Crome, MD  Polyethyl Glycol-Propyl Glycol (SYSTANE OP) Place 1 drop into the left eye daily as needed (dry eyes).    Yes Historical Provider, MD  valsartan (DIOVAN) 160 MG tablet Take 160 mg by mouth daily.   Yes Historical Provider, MD  Ascorbic Acid (VITAMIN C ADULT GUMMIES) 125 MG CHEW Chew 2 tablets by mouth daily.     Historical Provider, MD  chlorpheniramine (CHLOR-TRIMETON) 4 MG tablet Take 4 mg by mouth at bedtime.     Historical Provider, MD  cholecalciferol (VITAMIN D) 1000 units tablet Take 2,000 Units by mouth daily.    Historical Provider, MD  loperamide (IMODIUM) 2 MG capsule Take 1 capsule (2 mg total) by mouth 4 (four) times daily as needed for diarrhea or loose stools. Do not take more than 11m in a day 06/22/16   AKarma Greaser MD  PRESCRIPTION MEDICATION Inhale into the lungs See admin instructions. Use CPAP at night and whenever taking a nap    Historical Provider, MD    Family History Family History  Problem Relation Age of Onset  . Hypertension Mother   .  Cancer Father     Social History Social History  Substance Use Topics  . Smoking status: Never Smoker  . Smokeless tobacco: Never Used  . Alcohol use No     Allergies   Penicillins; Tramadol; Zestril [lisinopril]; Neomycin; Codeine; Erythromycin; Hydromorphone; and Percocet [oxycodone-acetaminophen]   Review of Systems Review of Systems  Constitutional: Negative for fever.  Allergic/Immunologic: Negative for immunocompromised state.  All other systems reviewed and are negative.    Physical Exam Updated Vital Signs BP 116/55   Pulse 82   Temp 98.9 F (37.2 C) (Oral)   Resp 19   Ht 5' 1"  (1.549 m)   Wt 104.3 kg   SpO2 99%   BMI 43.46 kg/m She came here on the fifth some tenderness on the left lower quadrant  Physical Exam  Constitutional: She is oriented to person, place, and time. She appears well-developed and well-nourished. No distress.  HENT:  Head: Normocephalic and atraumatic.  Eyes: Conjunctivae are normal. Right eye exhibits no discharge. Left eye exhibits no discharge.  Neck: Normal range of motion. Neck supple.  Cardiovascular: Normal rate and regular rhythm.   Pulmonary/Chest: Effort normal and breath sounds normal. No respiratory distress.  Abdominal: Soft. Bowel sounds are normal. She exhibits no distension and no mass. There is tenderness (mild epigastric and RLQ abdominal pain). There is no rebound and no guarding.  Stool around rectum examined, external hemorrhoid, no melena or active bleeding noted; stool is dark brown  Musculoskeletal: She exhibits no edema.  Neurological: She is alert and oriented to person, place, and time.  Skin: Skin is warm. No rash noted.  Psychiatric: She has a normal mood and affect.  Nursing note and vitals reviewed.    ED Treatments / Results  Labs (all labs ordered are listed, but only abnormal results are displayed) Labs Reviewed  COMPREHENSIVE METABOLIC PANEL - Abnormal; Notable for the following:       Result  Value   Potassium 3.2 (*)    Glucose, Bld 104 (*)    BUN 5 (*)  Calcium 8.4 (*)    Total Protein 5.5 (*)    Albumin 2.7 (*)    All other components within normal limits  CBC - Abnormal; Notable for the following:    RDW 16.3 (*)    All other components within normal limits  URINALYSIS, ROUTINE W REFLEX MICROSCOPIC - Abnormal; Notable for the following:    APPearance HAZY (*)    Leukocytes, UA MODERATE (*)    Bacteria, UA RARE (*)    Squamous Epithelial / LPF 0-5 (*)    All other components within normal limits  POC OCCULT BLOOD, ED - Abnormal; Notable for the following:    Fecal Occult Bld POSITIVE (*)    All other components within normal limits  LIPASE, BLOOD  TYPE AND SCREEN    EKG  EKG Interpretation None       Radiology No results found.  Procedures Procedures (including critical care time)  Medications Ordered in ED Medications  loperamide (IMODIUM) capsule 4 mg (not administered)  potassium chloride SA (K-DUR,KLOR-CON) CR tablet 40 mEq (40 mEq Oral Given 06/22/16 1828)  sodium chloride 0.9 % bolus 1,000 mL (0 mLs Intravenous Stopped 06/22/16 1927)     Initial Impression / Assessment and Plan / ED Course  I have reviewed the triage vital signs and the nursing notes.  Pertinent labs & imaging results that were available during my care of the patient were reviewed by me and considered in my medical decision making (see chart for details).      Patient appears dehydrated and orthostatic. Rectal exam without significant blood or melena. Will obtain labs to assess for significant hemoglobin drop and hydrate.  Patient reassessed after labs returned. Her hemoglobin is stable from 12.6 to 12.2. There is no melena on exam. No active rectal bleeding. Rectal bleeding has been ongoing for a long time. BUN is only 5. I do not suspect that there is an active upper GI bleed. I suspect that her symptoms are more related to her significant diarrhea and dehydration as a  result of this; however, these have been ongoing since May.   Fluid given and she feels remarkably better. I initially had attempted to walk her but she felt lightheaded with only going from supine to sitting. Now that she has received fluids she's able to ambulate without any difficulty and stand without symptoms.   Spoke with Dr. Penny Pia, from GI. Recommended low dose imodium.  No indication for emergent endoscopy. Patient is well-appearing, abdomen and nonpertinent. Do not suspect that she has a complication from her diverticulitis. No fever. No white count. Pt does have WBC on urine but already on cipro/flagyl. No dysuria. Will have pt finish abx, Ucx sent. K replaced/ordered. Patient can follow-up as an outpatient with her GI doctor on Monday.   Final Clinical Impressions(s) / ED Diagnoses   Final diagnoses:  Diarrhea, unspecified type  Rectal bleeding  Dehydration    New Prescriptions New Prescriptions   LOPERAMIDE (IMODIUM) 2 MG CAPSULE    Take 1 capsule (2 mg total) by mouth 4 (four) times daily as needed for diarrhea or loose stools. Do not take more than 31m in a day     AKarma Greaser MD 06/23/16 0102    EQuintella Reichert MD 06/26/16 [phone]

## 2016-07-03 ENCOUNTER — Other Ambulatory Visit: Payer: Self-pay | Admitting: Family Medicine

## 2016-07-03 DIAGNOSIS — M7989 Other specified soft tissue disorders: Secondary | ICD-10-CM

## 2016-07-04 ENCOUNTER — Ambulatory Visit
Admission: RE | Admit: 2016-07-04 | Discharge: 2016-07-04 | Disposition: A | Discharge status: Home / Self Care | Payer: Medicare Other | Source: Ambulatory Visit | Attending: Family Medicine | Admitting: Family Medicine

## 2016-07-04 DIAGNOSIS — M7989 Other specified soft tissue disorders: Secondary | ICD-10-CM

## 2016-08-29 ENCOUNTER — Other Ambulatory Visit: Payer: Self-pay | Admitting: Family Medicine

## 2016-08-29 DIAGNOSIS — Z1231 Encounter for screening mammogram for malignant neoplasm of breast: Secondary | ICD-10-CM

## 2016-09-12 ENCOUNTER — Ambulatory Visit (INDEPENDENT_AMBULATORY_CARE_PROVIDER_SITE_OTHER)
Admission: RE | Admit: 2016-09-12 | Discharge: 2016-09-12 | Disposition: A | Discharge status: Home / Self Care | Payer: Medicare Other | Source: Ambulatory Visit | Attending: Pulmonary Disease | Admitting: Pulmonary Disease

## 2016-09-12 DIAGNOSIS — R911 Solitary pulmonary nodule: Secondary | ICD-10-CM | POA: Diagnosis not present

## 2016-09-23 ENCOUNTER — Ambulatory Visit
Admission: RE | Admit: 2016-09-23 | Discharge: 2016-09-23 | Disposition: A | Discharge status: Home / Self Care | Payer: Medicare Other | Source: Ambulatory Visit | Attending: Family Medicine | Admitting: Family Medicine

## 2016-09-23 DIAGNOSIS — Z1231 Encounter for screening mammogram for malignant neoplasm of breast: Secondary | ICD-10-CM

## 2016-10-13 ENCOUNTER — Encounter: Payer: Self-pay | Admitting: Pulmonary Disease

## 2016-10-15 ENCOUNTER — Ambulatory Visit (INDEPENDENT_AMBULATORY_CARE_PROVIDER_SITE_OTHER): Payer: Medicare Other | Admitting: Pulmonary Disease

## 2016-10-15 ENCOUNTER — Encounter: Payer: Self-pay | Admitting: Pulmonary Disease

## 2016-10-15 DIAGNOSIS — G4733 Obstructive sleep apnea (adult) (pediatric): Secondary | ICD-10-CM | POA: Diagnosis not present

## 2016-10-15 DIAGNOSIS — R911 Solitary pulmonary nodule: Secondary | ICD-10-CM | POA: Diagnosis not present

## 2016-10-15 NOTE — Patient Instructions (Signed)
CT chest no contrast in October 2018

## 2016-10-15 NOTE — Progress Notes (Addendum)
   Subjective:    Patient ID: Isabella [name], female    DOB: 1940/08/03, 76 y.o.   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 CPAP is working well and she reports good improvement in her daytime somnolence and fatigue No problems with mask or pressure. Download was reviewed which shows good control of events and 12 cm with no residuals, excellent compliance and minimal leak    Significant tests/ events  PET 11/3401 Hypermetabolic right lower lobe nodule, SUV 4.0 Scattered pulmonary nodules unchanged from 2015, right upper lobe 6 mm nodule SUV 2.0  03/2016 ENB neg   Review of Systems Patient denies significant dyspnea,cough, hemoptysis,  chest pain, palpitations, pedal edema, orthopnea, paroxysmal nocturnal dyspnea, lightheadedness, nausea, vomiting, abdominal or  leg pains      Objective:   Physical Exam  Gen. Pleasant, obese, in no distress ENT - no lesions, no post nasal drip Neck: No JVD, no thyromegaly, no carotid bruits Lungs: no use of accessory muscles, no dullness to percussion, decreased without rales or rhonchi  Cardiovascular: Rhythm regular, heart sounds  normal, no murmurs or gallops, no peripheral edema Musculoskeletal: No deformities, no cyanosis or clubbing , no tremors       Assessment & Plan:

## 2016-10-15 NOTE — Assessment & Plan Note (Signed)
Differential diagnoses includes benign lesion as noted on prior biopsy versus malignancy slow-growing adenocarcinoma given mild hypermetabolism  She would prefer to take the wait and watch approach-unfortunately this nodule is proven difficult to biopsy, not a candidate for CT-guided due to deep location and navigation bronchoscopy has been nondiagnostic in 03/2016. So surgical biopsy would be the only Route should there be controlled and this nodule. We will reassess with CT scan in 4 months

## 2016-10-15 NOTE — Assessment & Plan Note (Signed)
Stable, continue 12 cm  Weight loss encouraged, compliance with goal of at least 4-6 hrs every night is the expectation. Advised against medications with sedative side effects Cautioned against driving when sleepy - understanding that sleepiness will vary on a day to day basis

## 2017-01-27 ENCOUNTER — Telehealth: Payer: Self-pay | Admitting: Pulmonary Disease

## 2017-01-27 DIAGNOSIS — R911 Solitary pulmonary nodule: Secondary | ICD-10-CM

## 2017-01-27 NOTE — Telephone Encounter (Signed)
This Ct has been scheduled on 01/29/2017 @1 :30pm patient is aware of the appt and location

## 2017-01-27 NOTE — Telephone Encounter (Signed)
  Spoke with pt, she states she was supposed to have a CT Chest done in October. According to [facility] notes this is correct. I placed order for CT scan but wanted to see if PCC's can try to get her scheduled before Friday. If not we will have to push her appt back.

## 2017-01-29 ENCOUNTER — Ambulatory Visit (INDEPENDENT_AMBULATORY_CARE_PROVIDER_SITE_OTHER)
Admission: RE | Admit: 2017-01-29 | Discharge: 2017-01-29 | Disposition: A | Discharge status: Home / Self Care | Payer: Medicare Other | Source: Ambulatory Visit | Attending: Pulmonary Disease | Admitting: Pulmonary Disease

## 2017-01-29 DIAGNOSIS — R911 Solitary pulmonary nodule: Secondary | ICD-10-CM | POA: Diagnosis not present

## 2017-01-30 ENCOUNTER — Other Ambulatory Visit: Payer: Self-pay

## 2017-01-30 DIAGNOSIS — R911 Solitary pulmonary nodule: Secondary | ICD-10-CM

## 2017-01-31 ENCOUNTER — Ambulatory Visit (INDEPENDENT_AMBULATORY_CARE_PROVIDER_SITE_OTHER): Payer: Medicare Other | Admitting: Pulmonary Disease

## 2017-01-31 ENCOUNTER — Encounter: Payer: Self-pay | Admitting: Pulmonary Disease

## 2017-01-31 VITALS — BP 116/80 | HR 76 | Ht 61.0 in | Wt 216.4 lb

## 2017-01-31 DIAGNOSIS — Z23 Encounter for immunization: Secondary | ICD-10-CM | POA: Diagnosis not present

## 2017-01-31 DIAGNOSIS — R911 Solitary pulmonary nodule: Secondary | ICD-10-CM

## 2017-01-31 NOTE — Patient Instructions (Signed)
Flu shot today. We discussed that nodule is stable and new cyst on the kidney which we will keep a watch on CT chest without contrast in 6 months

## 2017-01-31 NOTE — Progress Notes (Signed)
   Subjective:    Patient ID: Isabella [name], female    DOB: [birth year], 76 y.o.   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  She is due for corneal transplant in first week of October. She is also having a flareup of her ulcerative colitis symptoms    Significant tests/ events  PET 09/91 Hypermetabolic right lower lobe nodule,SUV 4.0 Scattered pulmonary nodules unchanged from 2015, right upper lobe 6 mm nodule SUV 2.0  03/2016 ENB neg  Review of Systems neg for any significant sore throat, dysphagia, itching, sneezing, nasal congestion or excess/ purulent secretions, fever, chills, sweats, unintended wt loss, pleuritic or exertional cp, hempoptysis, orthopnea pnd or change in chronic leg swelling. Also denies presyncope, palpitations, heartburn, abdominal pain, nausea, vomiting, diarrhea or change in bowel or urinary habits, dysuria,hematuria, rash, arthralgias, visual complaints, headache, numbness weakness or ataxia.     Objective:   Physical Exam  Gen. Pleasant, obese, in no distress ENT - no lesions, no post nasal drip Neck: No JVD, no thyromegaly, no carotid  bruits Lungs: no use of accessory muscles, no dullness to percussion, decreased without rales or rhonchi  Cardiovascular: Rhythm regular, heart sounds  normal, no murmurs or gallops, no peripheral edema Musculoskeletal: No deformities, no cyanosis or clubbing , no tremors       Assessment & Plan:

## 2017-01-31 NOTE — Assessment & Plan Note (Signed)
Ct CPAP 12 cm  Weight loss encouraged, compliance with goal of at least 4-6 hrs every night is the expectation. Advised against medications with sedative side effects Cautioned against driving when sleepy - understanding that sleepiness will vary on a day to day basis

## 2017-05-20 ENCOUNTER — Other Ambulatory Visit: Payer: Self-pay

## 2017-05-20 ENCOUNTER — Encounter (HOSPITAL_COMMUNITY): Payer: Self-pay | Admitting: Emergency Medicine

## 2017-05-20 DIAGNOSIS — Z79899 Other long term (current) drug therapy: Secondary | ICD-10-CM | POA: Diagnosis not present

## 2017-05-20 DIAGNOSIS — Z7982 Long term (current) use of aspirin: Secondary | ICD-10-CM | POA: Diagnosis not present

## 2017-05-20 DIAGNOSIS — I252 Old myocardial infarction: Secondary | ICD-10-CM | POA: Diagnosis not present

## 2017-05-20 DIAGNOSIS — I809 Phlebitis and thrombophlebitis of unspecified site: Secondary | ICD-10-CM | POA: Diagnosis not present

## 2017-05-20 DIAGNOSIS — Z955 Presence of coronary angioplasty implant and graft: Secondary | ICD-10-CM | POA: Insufficient documentation

## 2017-05-20 DIAGNOSIS — M79604 Pain in right leg: Secondary | ICD-10-CM | POA: Diagnosis present

## 2017-05-20 DIAGNOSIS — I10 Essential (primary) hypertension: Secondary | ICD-10-CM | POA: Insufficient documentation

## 2017-05-20 DIAGNOSIS — Z96653 Presence of artificial knee joint, bilateral: Secondary | ICD-10-CM | POA: Diagnosis not present

## 2017-05-20 DIAGNOSIS — I2510 Atherosclerotic heart disease of native coronary artery without angina pectoris: Secondary | ICD-10-CM | POA: Insufficient documentation

## 2017-05-20 LAB — BASIC METABOLIC PANEL
Anion gap: 7 (ref 5–15)
BUN: 17 mg/dL (ref 6–20)
CO2: 31 mmol/L (ref 22–32)
CREATININE: 0.91 mg/dL (ref 0.44–1.00)
Calcium: 8.5 mg/dL — ABNORMAL LOW (ref 8.9–10.3)
Chloride: 103 mmol/L (ref 101–111)
GFR calc Af Amer: >60 mL/min (ref >60)
Glucose, Bld: 103 mg/dL — ABNORMAL HIGH (ref 65–99)
Potassium: 3.4 mmol/L — ABNORMAL LOW (ref 3.5–5.1)
SODIUM: 141 mmol/L (ref 135–145)

## 2017-05-20 LAB — CBC
HCT: 37.5 % (ref 36.0–46.0)
Hemoglobin: 10.9 g/dL — ABNORMAL LOW (ref 12.0–15.0)
MCH: 24.7 pg — AB (ref 26.0–34.0)
MCHC: 29.1 g/dL — AB (ref 30.0–36.0)
MCV: 85 fL (ref 78.0–100.0)
PLATELETS: 298 10*3/uL (ref 150–400)
RBC: 4.41 MIL/uL (ref 3.87–5.11)
RDW: 18 % — AB (ref 11.5–15.5)
WBC: 8.4 10*3/uL (ref 4.0–10.5)

## 2017-05-20 NOTE — ED Triage Notes (Signed)
Pt. Stated, Ai was out shopping for about a hour and I notice my calf swollen more than usual. I went to [facility] in and it didn't hurt until he pushed on there. Its the right calf. I HAVE SWELLING BUT NOT THIS BAD.

## 2017-05-21 ENCOUNTER — Emergency Department (HOSPITAL_COMMUNITY)
Admission: EM | Admit: 2017-05-21 | Discharge: 2017-05-21 | Disposition: A | Discharge status: Home / Self Care | Payer: Medicare Other | Attending: Emergency Medicine | Admitting: Emergency Medicine

## 2017-05-21 ENCOUNTER — Encounter (HOSPITAL_COMMUNITY): Payer: Medicare Other

## 2017-05-21 ENCOUNTER — Emergency Department (HOSPITAL_BASED_OUTPATIENT_CLINIC_OR_DEPARTMENT_OTHER)
Admit: 2017-05-21 | Discharge: 2017-05-21 | Disposition: A | Discharge status: Home / Self Care | Payer: Medicare Other | Attending: Emergency Medicine | Admitting: Emergency Medicine

## 2017-05-21 DIAGNOSIS — I809 Phlebitis and thrombophlebitis of unspecified site: Secondary | ICD-10-CM

## 2017-05-21 DIAGNOSIS — M7989 Other specified soft tissue disorders: Secondary | ICD-10-CM

## 2017-05-21 NOTE — ED Provider Notes (Signed)
[facility] EMERGENCY DEPARTMENT Provider Note   CSN: [id number] Arrival date & time: 05/20/17  1837     History   Chief Complaint Chief Complaint  Patient presents with  . Leg Pain  . Leg Swelling    HPI Isabella [name] is a 77 y.o. female.  Complaint is right leg pain and swelling  HPI this is a 77 year old female who has a history of dependent edema.  She was up and around on her feet more than usual yesterday.  Noticed that her right leg had become swollen and had a tender area near her knee.  History of phlebitis.  Has never had DVT.  Takes aspirin.  No additional anticoagulants.  Past Medical History:  Diagnosis Date  . Arthritis   . Complication of anesthesia    Took a while to wake up with knee  . Coronary artery disease   . Dyspnea    with walking at times  . Fallen bladder   . GERD (gastroesophageal reflux disease)   . 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 Headache    migraines in her younger years  . Hemorrhoids   . High cholesterol   . History of blood transfusion   . Hx of adenomatous colonic polyps   . Hypertension   . Lung nodule   . Myocardial infarct ([place]) 03/24/2011   Dr. [name]  . Nocturnal leg cramps   . Obesity   . Sarcoidosis   . Sleep apnea    uses cpap    Patient Active Problem List   Diagnosis Date Noted  . Degenerative spondylolisthesis 02/01/2014  . Sarcoidosis 07/14/2013  . Abnormal CT of the chest 04/21/2013  . Coronary atherosclerosis of native coronary artery 03/29/2013    Class: Chronic  . Old MI (myocardial infarction) 03/29/2013    Class: Chronic  . OA (osteoarthritis) of knee 06/17/2012  . Lung nodule 06/03/2012  . DJD (degenerative joint disease) 06/01/2012  . 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 Edema of lower extremity 03/29/2011  . Anemia associated with acute blood loss 03/25/2011  . Hematoma following PCI 03/24/2011  .  Obesity (BMI 30-39.9)   . Obstructive sleep apnea 01/25/2009  . Hyperlipidemia 01/24/2009  . Essential hypertension 01/24/2009    Past Surgical History:  Procedure Laterality Date  . ABDOMINAL HYSTERECTOMY  1990  . APPENDECTOMY  1970  . BACK SURGERY  2015   lower back  . CARDIAC CATHETERIZATION  03/2011  . CARPAL TUNNEL RELEASE Bilateral 2004  . CATARACT EXTRACTION    . CHOLECYSTECTOMY  1970  . COLONOSCOPY W/ BIOPSIES AND POLYPECTOMY    . CORONARY ANGIOPLASTY  2012  . CORONARY STENT PLACEMENT  03/24/2011   Dr. [name]  . EYE SURGERY  2008,2011   corneal transplant  . FEMORAL ARTERY EXPLORATION  03/25/2011   Procedure: FEMORAL ARTERY EXPLORATION;  Surgeon: Elam Dutch, MD;  Location: [facility];  Service: Vascular;  Laterality: Right;  Evacuation of Hematoma   . hematoma surgery     from cardiac cath -hematoma in groin-right  . HERNIA REPAIR  7622   umbilical  . JOINT REPLACEMENT    . KNEE ARTHROSCOPY  2006   Right  . [place] Surgery Left Eye  2012  . LEFT HEART CATHETERIZATION WITH CORONARY ANGIOGRAM N/A 03/24/2011   Procedure: LEFT HEART CATHETERIZATION WITH CORONARY ANGIOGRAM;  Surgeon: Sinclair Grooms, MD;  Location:  [facility] CATH LAB;  Service: Cardiovascular;  Laterality: N/A;  . PERCUTANEOUS CORONARY STENT INTERVENTION (PCI-S) N/A 03/24/2011   Procedure: PERCUTANEOUS CORONARY STENT INTERVENTION (PCI-S);  Surgeon: Sinclair Grooms, MD;  Location: [facility] CATH LAB;  Service: Cardiovascular;  Laterality: N/A;  . SHOULDER SURGERY  2010  . TONSILLECTOMY  1949  . TONSILLECTOMY AND ADENOIDECTOMY    . TOTAL KNEE ARTHROPLASTY  07/2010   Left  . TOTAL KNEE ARTHROPLASTY  06/17/2012   Procedure: TOTAL KNEE ARTHROPLASTY;  Surgeon: Gearlean Alf, MD;  Location: [facility];  Service: Orthopedics;  Laterality: Right;  . TUBAL LIGATION  1978  . [place], 2007   x 2  . VIDEO BRONCHOSCOPY WITH ENDOBRONCHIAL NAVIGATION  04/13/2012   Procedure: VIDEO BRONCHOSCOPY WITH ENDOBRONCHIAL  NAVIGATION;  Surgeon: Melrose Nakayama, MD;  Location: [facility];  Service: Thoracic;  Laterality: N/A;  . VIDEO BRONCHOSCOPY WITH ENDOBRONCHIAL NAVIGATION N/A 04/08/2016   Procedure: VIDEO BRONCHOSCOPY WITH ENDOBRONCHIAL NAVIGATION;  Surgeon: Rigoberto Noel, MD;  Location: [facility];  Service: Thoracic;  Laterality: N/A;    OB History    No data available       Home Medications    Prior to Admission medications   Medication Sig Start Date End Date Taking? Authorizing Provider  acetaminophen (TYLENOL) 500 MG tablet Take 500 mg by mouth every 6 (six) hours as needed for mild pain or headache.   Yes [provider]  aspirin 81 MG chewable tablet Chew 81 mg by mouth daily at 3 pm.    Yes [provider]  atorvastatin (LIPITOR) 40 MG tablet Take 40 mg by mouth at bedtime.   Yes [provider]  chlorpheniramine (CHLOR-TRIMETON) 4 MG tablet Take 4 mg by mouth at bedtime.    Yes [provider]  Cholecalciferol (VITAMIN D-3) 1000 units CAPS Take 2,000 Units by mouth daily.   Yes [provider]  furosemide (LASIX) 40 MG tablet Take 40 mg by mouth daily.    Yes [provider]  hydrocortisone 2.5 % cream Apply 1 application topically See admin instructions. 1 application rectally as needed for hemorrhoidal pain 03/08/17  Yes [provider]  losartan (COZAAR) 50 MG tablet Take 50 mg by mouth daily as needed (ONLY IF BLOOD PRESSURE IS GREATER THAN 110/70).   Yes [provider]  mesalamine (APRISO) 0.375 g 24 hr capsule Take 1.5 mg by mouth daily.   Yes [provider]  metoprolol tartrate (LOPRESSOR) 25 MG tablet Take 25 mg by mouth See admin instructions. Take 25 mg by mouth two times a day (morning and 3 PM) ONLY IF BLOOD PRESSURE IS GREATER THAN 110/70   Yes [provider]  Multiple Vitamins-Minerals (CENTRUM SILVER 50+WOMEN) TABS Take 1 tablet by mouth daily.   Yes [provider]  nitroGLYCERIN  (NITROSTAT) 0.4 MG SL tablet Place 1 tablet (0.4 mg total) under the tongue as needed. For chest pain Patient taking differently: Place 0.4 mg under the tongue every 5 (five) minutes as needed for chest pain. For chest pain 12/05/14  Yes Belva Crome, MD  prednisoLONE acetate (PRED FORTE) 1 % ophthalmic suspension Place 1 drop into the left eye 3 (three) times daily. 05/15/17  Yes [provider]  predniSONE (DELTASONE) 10 MG tablet Take 20 mg by mouth daily with breakfast.   Yes [provider]  sodium chloride (MURO 128) 5 % ophthalmic solution Place 1 drop into the left eye 3 (three) times  daily.   Yes [provider]  UNABLE TO FIND CPAP: At bedtime nightly and during all naps   Yes [provider]  acetaminophen (TYLENOL) 325 MG tablet Take 325-650 mg by mouth every 6 (six) hours as needed for mild pain.    [provider]    Family History Family History  Problem Relation Age of Onset  . Hypertension Mother   . Cancer Father   . Breast cancer Maternal Grandmother     Social History Social History   Tobacco Use  . Smoking status: Never Smoker  . Smokeless tobacco: Never Used  Substance Use Topics  . Alcohol use: No  . Drug use: No     Allergies   Imodium a-d [loperamide hcl]; Penicillins; Tramadol; Zestril [lisinopril]; Neomycin; Adhesive [tape]; Codeine; Erythromycin; Hydromorphone; and Percocet [oxycodone-acetaminophen]   Review of Systems Review of Systems  Constitutional: Negative for appetite change, chills, diaphoresis, fatigue and fever.  HENT: Negative for mouth sores, sore throat and trouble swallowing.   Eyes: Negative for visual disturbance.  Respiratory: Negative for cough, chest tightness, shortness of breath and wheezing.   Cardiovascular: Positive for leg swelling. Negative for chest pain.  Gastrointestinal: Negative for abdominal distention, abdominal pain, diarrhea, nausea and vomiting.  Endocrine: Negative for  polydipsia, polyphagia and polyuria.  Genitourinary: Negative for dysuria, frequency and hematuria.  Musculoskeletal: Negative for gait problem.  Skin: Negative for color change, pallor and rash.  Neurological: Negative for dizziness, syncope, light-headedness and headaches.  Hematological: Does not bruise/bleed easily.  Psychiatric/Behavioral: Negative for behavioral problems and confusion.     Physical Exam Updated Vital Signs BP 129/88 (BP Location: Left Arm)   Pulse 61   Temp 98.6 F (37 C) (Oral)   Resp 14   Ht 5' 1"  (1.549 m)   Wt 98 kg (216 lb)   SpO2 100%   BMI 40.81 kg/m   Physical Exam  Constitutional: She is oriented to person, place, and time. She appears well-developed and well-nourished. No distress.  HENT:  Head: Normocephalic.  Eyes: Conjunctivae are normal. Pupils are equal, round, and reactive to light. No scleral icterus.  Neck: Normal range of motion. Neck supple. No thyromegaly present.  Cardiovascular: Normal rate and regular rhythm. Exam reveals no gallop and no friction rub.  No murmur heard. Pulmonary/Chest: Effort normal and breath sounds normal. No respiratory distress. She has no wheezes. She has no rales.  Abdominal: Soft. Bowel sounds are normal. She exhibits no distension. There is no tenderness. There is no rebound.  Musculoskeletal: Normal range of motion.  Neurological: She is alert and oriented to person, place, and time.  Skin: Skin is warm and dry. No rash noted.  1+ symmetric bilateral lower extremity edema.  No areas of erythema or palpable cording.   One Centimeter difference greater circumference right versus left  Psychiatric: She has a normal mood and affect. Her behavior is normal.     ED Treatments / Results  Labs (all labs ordered are listed, but only abnormal results are displayed) Labs Reviewed  CBC - Abnormal; Notable for the following components:      Result Value   Hemoglobin 10.9 (*)    MCH 24.7 (*)    MCHC 29.1 (*)     RDW 18.0 (*)    All other components within normal limits  BASIC METABOLIC PANEL - Abnormal; Notable for the following components:   Potassium 3.4 (*)    Glucose, Bld 103 (*)    Calcium 8.5 (*)  All other components within normal limits    EKG  EKG Interpretation None       Radiology No results found.  Procedures Procedures (including critical care time)  Medications Ordered in ED Medications - No data to display   Initial Impression / Assessment and Plan / ED Course  I have reviewed the triage vital signs and the nursing notes.  Pertinent labs & imaging results that were available during my care of the patient were reviewed by me and considered in my medical decision making (see chart for details).    Duplex Doppler ultrasound shows superficial venous thrombosis of the greater saphenous.  This is short segment along the medial knee consistent with her area of pain and tenderness.  No DVT noted.  Plan heat, increase from 81-324 aspirin daily.  Repeat ultrasound with primary care in 2 weeks if not resolved.  Final Clinical Impressions(s) / ED Diagnoses   Final diagnoses:  Superficial phlebitis    ED Discharge Orders    None       Tanna Furry, MD 05/21/17 [phone]

## 2017-05-21 NOTE — Discharge Instructions (Signed)
Apply heat to tender area of leg. Aspirin 324 mg per day ( 4 baby aspirin) Check with your physician if not resolved in 2 weeks.  You may need repeat ultrasound

## 2017-05-21 NOTE — ED Notes (Signed)
ED Provider at bedside. 

## 2017-05-21 NOTE — Progress Notes (Addendum)
Right lower extremity venous duplex has been completed. Negative for obvious evidence of DVT. There is evidence of superficial vein thrombosis involving the short saphenous vein of the right lower extremity. Results were given to Dr. Jeneen Rinks.  05/21/17 8:37 AM Isabella [name] RVT

## 2017-05-21 NOTE — ED Notes (Signed)
Patient transported to Ultrasound 

## 2017-05-27 ENCOUNTER — Telehealth: Payer: Self-pay | Admitting: *Deleted

## 2017-05-27 NOTE — Telephone Encounter (Signed)
REFERRAL SENT TO SCHEDULING.

## 2017-06-11 NOTE — Progress Notes (Signed)
Cardiology Office Note    Date:  06/12/2017   ID:  Isabella, [name] 04/08/1941, MRN [id number]  PCP:  [name], MD  Cardiologist: Sinclair Grooms, MD   Chief Complaint  Patient presents with  . Coronary Artery Disease  . Leg Swelling    History of Present Illness:  Isabella [name] is a 77 y.o. female with history of acute ST elevation inferior myocardial infarction in 2012 with right coronary DES 2 (proximal for catheter-induced dissection and distal to treat acute lesion), hypertension, and hyperlipidemia.   Since last seeing her, she has developed ulcerative colitis.  Had a long past 12 months dealing with complications and symptoms related to the problem.  Now on prednisone therapy as well as other therapy.  No angina or chest pain complaints.  She has a concern about right greater than left lower extremity swelling.  Has a prior history of right lower extremity DVT and phlebitis.  Has been treated for phlebitis.  Prior treatment of DVT.  Right greater than left lower extremity swelling.  No history of pulmonary emboli.   Past Medical History:  Diagnosis Date  . Arthritis   . Complication of anesthesia    Took a while to wake up with knee  . Coronary artery disease   . Dyspnea    with walking at times  . Fallen bladder   . GERD (gastroesophageal reflux disease)   . 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 Headache    migraines in her younger years  . Hemorrhoids   . High cholesterol   . History of blood transfusion   . Hx of adenomatous colonic polyps   . Hypertension   . Lung nodule   . Myocardial infarct ([place]) 03/24/2011   Dr. [name]  . Nocturnal leg cramps   . Obesity   . Sarcoidosis   . Sleep apnea    uses cpap    Past Surgical History:  Procedure Laterality Date  . ABDOMINAL HYSTERECTOMY  1990  . APPENDECTOMY  1970  . BACK SURGERY  2015   lower back  . CARDIAC  CATHETERIZATION  03/2011  . CARPAL TUNNEL RELEASE Bilateral 2004  . CATARACT EXTRACTION    . CHOLECYSTECTOMY  1970  . COLONOSCOPY W/ BIOPSIES AND POLYPECTOMY    . CORONARY ANGIOPLASTY  2012  . CORONARY STENT PLACEMENT  03/24/2011   Dr. [name]  . EYE SURGERY  2008,2011   corneal transplant  . FEMORAL ARTERY EXPLORATION  03/25/2011   Procedure: FEMORAL ARTERY EXPLORATION;  Surgeon: Elam Dutch, MD;  Location: [facility];  Service: Vascular;  Laterality: Right;  Evacuation of Hematoma   . hematoma surgery     from cardiac cath -hematoma in groin-right  . HERNIA REPAIR  4854   umbilical  . JOINT REPLACEMENT    . KNEE ARTHROSCOPY  2006   Right  . [place] Surgery Left Eye  2012  . LEFT HEART CATHETERIZATION WITH CORONARY ANGIOGRAM N/A 03/24/2011   Procedure: LEFT HEART CATHETERIZATION WITH CORONARY ANGIOGRAM;  Surgeon: Sinclair Grooms, MD;  Location: [facility] CATH LAB;  Service: Cardiovascular;  Laterality: N/A;  . PERCUTANEOUS CORONARY STENT INTERVENTION (PCI-S) N/A 03/24/2011   Procedure: PERCUTANEOUS CORONARY STENT INTERVENTION (PCI-S);  Surgeon: Sinclair Grooms, MD;  Location: [facility] CATH LAB;  Service: Cardiovascular;  Laterality: N/A;  . SHOULDER SURGERY  2010  . TONSILLECTOMY  [place]    . TOTAL KNEE ARTHROPLASTY  07/2010   Left  . TOTAL KNEE ARTHROPLASTY  06/17/2012   Procedure: TOTAL KNEE ARTHROPLASTY;  Surgeon: Gearlean Alf, MD;  Location: [facility];  Service: Orthopedics;  Laterality: Right;  . TUBAL LIGATION  1978  . [place], 2007   x 2  . VIDEO BRONCHOSCOPY WITH ENDOBRONCHIAL NAVIGATION  04/13/2012   Procedure: VIDEO BRONCHOSCOPY WITH ENDOBRONCHIAL NAVIGATION;  Surgeon: Melrose Nakayama, MD;  Location: [facility];  Service: Thoracic;  Laterality: N/A;  . VIDEO BRONCHOSCOPY WITH ENDOBRONCHIAL NAVIGATION N/A 04/08/2016   Procedure: VIDEO BRONCHOSCOPY WITH ENDOBRONCHIAL NAVIGATION;  Surgeon: Rigoberto Noel, MD;  Location: [facility];   Service: Thoracic;  Laterality: N/A;    Current Medications: Outpatient Medications Prior to Visit  Medication Sig Dispense Refill  . acetaminophen (TYLENOL) 325 MG tablet Take 325-650 mg by mouth every 6 (six) hours as needed for mild pain.    Marland Kitchen acetaminophen (TYLENOL) 500 MG tablet Take 500 mg by mouth every 6 (six) hours as needed for mild pain or headache.    Marland Kitchen aspirin 81 MG chewable tablet Chew 81 mg by mouth daily at 3 pm.     . atorvastatin (LIPITOR) 40 MG tablet Take 40 mg by mouth at bedtime.    . chlorpheniramine (CHLOR-TRIMETON) 4 MG tablet Take 4 mg by mouth at bedtime.     . Cholecalciferol (VITAMIN D-3) 1000 units CAPS Take 2,000 Units by mouth daily.    . furosemide (LASIX) 40 MG tablet Take 40 mg by mouth daily.     . hydrocortisone 2.5 % cream Apply 1 application topically See admin instructions. 1 application rectally as needed for hemorrhoidal pain  3  . losartan (COZAAR) 50 MG tablet Take 50 mg by mouth daily as needed (ONLY IF BLOOD PRESSURE IS GREATER THAN 110/70).    . mesalamine (APRISO) 0.375 g 24 hr capsule Take 1.5 mg by mouth daily.    . metoprolol tartrate (LOPRESSOR) 25 MG tablet Take 25 mg by mouth See admin instructions. Take 25 mg by mouth two times a day (morning and 3 PM) ONLY IF BLOOD PRESSURE IS GREATER THAN 110/70    . Multiple Vitamins-Minerals (CENTRUM SILVER 50+WOMEN) TABS Take 1 tablet by mouth daily.    . potassium chloride SA (K-DUR,KLOR-CON) 20 MEQ tablet Take 20 mEq by mouth daily.  1  . prednisoLONE acetate (PRED FORTE) 1 % ophthalmic suspension Place 1 drop into the left eye 3 (three) times daily.  3  . predniSONE (DELTASONE) 10 MG tablet Take one (1) tablet (10 mg) by mouth daily for two (2) weeks. (Started 06-10-17) Then take half (1/2) tablet (5 mg) by mouth daily for two (2) weeks then stop.    . sodium chloride (MURO 128) 5 % ophthalmic solution Place 1 drop into the left eye 3 (three) times daily.    Marland Kitchen UNABLE TO FIND CPAP: At bedtime nightly  and during all naps    . nitroGLYCERIN (NITROSTAT) 0.4 MG SL tablet Place 1 tablet (0.4 mg total) under the tongue as needed. For chest pain (Patient not taking: Reported on 06/12/2017) 25 tablet 3  . predniSONE (DELTASONE) 10 MG tablet Take 20 mg by mouth daily with breakfast.     No facility-administered medications prior to visit.      Allergies:   Imodium a-d [loperamide hcl]; Penicillins; Tramadol; Zestril [lisinopril]; Neomycin; Adhesive [tape]; Codeine; Erythromycin; Hydromorphone; and Percocet [oxycodone-acetaminophen]  Social History   Socioeconomic History  . Marital status: Widowed    Spouse name: None  . Number of children: None  . Years of education: None  . Highest education level: None  Social Needs  . Financial resource strain: None  . Food insecurity - worry: None  . Food insecurity - inability: None  . Transportation needs - medical: None  . Transportation needs - non-medical: None  Occupational History  . Occupation: Retired    Fish farm manager: RETIRED  Tobacco Use  . Smoking status: Never Smoker  . Smokeless tobacco: Never Used  Substance and Sexual Activity  . Alcohol use: No  . Drug use: No  . Sexual activity: None  Other Topics Concern  . None  Social History Narrative   Tobacco use cigarettes: never smoked, tobacco history last updated 12/23/2013. No smoking. No alcohol. Caffeine: yes, coffee, 3 servings daily.recreational drug use: never. No diet. Exercise: walks everyday. Occupation: retired. Marital status: married. Children: boys, 2 girls, 2. Seat belt use: yes.     Family History:  The patient's family history includes Breast cancer in her maternal grandmother; Cancer in her father; Hypertension in her mother.   ROS:   Please see the history of present illness.    Abdominal discomfort, right lower extremity swelling. All other systems reviewed and are negative.   PHYSICAL EXAM:   VS:  BP (!) 148/92   Pulse 81   Ht 5' 1"  (1.549 m)   Wt 215 lb  6.4 oz (97.7 kg)   BMI 40.70 kg/m    GEN: Well nourished, well developed, in no acute distress  HEENT: normal  Neck: no JVD, carotid bruits, or masses Cardiac: RRR; no murmurs, rubs, or gallops 2+ right lower extremity swelling.  1+ left lower extremity swelling. Respiratory:  clear to auscultation bilaterally, normal work of breathing GI: soft, nontender, nondistended, + BS MS: no deformity or atrophy  Skin: warm and dry, no rash Neuro:  Alert and Oriented x 3, Strength and sensation are intact Psych: euthymic mood, full affect  Wt Readings from Last 3 Encounters:  06/12/17 215 lb 6.4 oz (97.7 kg)  05/20/17 216 lb (98 kg)  01/31/17 216 lb 6.4 oz (98.2 kg)      Studies/Labs Reviewed:   EKG:  EKG normal sinus rhythm with no acute ST-T wave change.  No evidence of prior infarction.  Recent Labs: 06/22/2016: ALT 20 05/20/2017: BUN 17; Creatinine, Ser 0.91; Hemoglobin 10.9; Platelets 298; Potassium 3.4; Sodium 141   Lipid Panel    Component Value Date/Time   CHOL 179 03/24/2011 1742   TRIG 105 03/24/2011 1742   HDL 73 03/24/2011 1742   CHOLHDL 2.5 03/24/2011 1742   VLDL 21 03/24/2011 1742   LDLCALC 85 03/24/2011 1742    Additional studies/ records that were reviewed today include:  No recent data.    ASSESSMENT:    1. Atherosclerosis of native coronary artery of native heart with stable angina pectoris ([place])   2. Essential hypertension   3. Old MI (myocardial infarction)   4. Obstructive sleep apnea   5. Sarcoidosis   6. Mixed hyperlipidemia      PLAN:  In order of problems listed above:  1. Stable without angina 2. Blood pressures under excellent control 3. No related. 4. Encourage CPAP 5. Not addressed 6. LDL target less than 70  Continue same therapy.  Lose weight.  LDL less than 70.  Clinical follow-up in 1 year.    Medication Adjustments/Labs and Tests  Ordered: Current medicines are reviewed at length with the patient today.  Concerns regarding  medicines are outlined above.  Medication changes, Labs and Tests ordered today are listed in the Patient Instructions below. Patient Instructions  Medication Instructions:  Your physician recommends that you continue on your current medications as directed. Please refer to the Current Medication list given to you today.  Labwork: None  Testing/Procedures: None  Follow-Up: Your physician wants you to follow-up in: 1 year with Dr. [name].  You will receive a reminder letter in the mail two months in advance. If you don't receive a letter, please call our office to schedule the follow-up appointment.   Any Other Special Instructions Will Be Listed Below (If Applicable).     If you need a refill on your cardiac medications before your next appointment, please call your pharmacy.      Signed, Sinclair Grooms, MD  06/12/2017 3:33 PM    [facility] [address], [place], [place]  [address] Phone: [phone]; Fax: [phone]

## 2017-06-12 ENCOUNTER — Encounter: Payer: Self-pay | Admitting: Interventional Cardiology

## 2017-06-12 ENCOUNTER — Ambulatory Visit: Payer: Medicare Other | Admitting: Interventional Cardiology

## 2017-06-12 VITALS — BP 148/92 | HR 81 | Ht 61.0 in | Wt 215.4 lb

## 2017-06-12 DIAGNOSIS — I10 Essential (primary) hypertension: Secondary | ICD-10-CM

## 2017-06-12 DIAGNOSIS — D869 Sarcoidosis, unspecified: Secondary | ICD-10-CM | POA: Diagnosis not present

## 2017-06-12 DIAGNOSIS — E782 Mixed hyperlipidemia: Secondary | ICD-10-CM | POA: Diagnosis not present

## 2017-06-12 DIAGNOSIS — I252 Old myocardial infarction: Secondary | ICD-10-CM | POA: Diagnosis not present

## 2017-06-12 DIAGNOSIS — I25118 Atherosclerotic heart disease of native coronary artery with other forms of angina pectoris: Secondary | ICD-10-CM

## 2017-06-12 DIAGNOSIS — G4733 Obstructive sleep apnea (adult) (pediatric): Secondary | ICD-10-CM

## 2017-06-12 NOTE — Patient Instructions (Signed)

## 2017-07-31 ENCOUNTER — Ambulatory Visit (INDEPENDENT_AMBULATORY_CARE_PROVIDER_SITE_OTHER)
Admission: RE | Admit: 2017-07-31 | Discharge: 2017-07-31 | Disposition: A | Discharge status: Home / Self Care | Payer: Medicare Other | Source: Ambulatory Visit | Attending: Pulmonary Disease | Admitting: Pulmonary Disease

## 2017-07-31 DIAGNOSIS — R911 Solitary pulmonary nodule: Secondary | ICD-10-CM | POA: Diagnosis not present

## 2017-08-07 ENCOUNTER — Telehealth: Payer: Self-pay | Admitting: Pulmonary Disease

## 2017-08-07 NOTE — Telephone Encounter (Signed)
Patient has been scheduled with RA

## 2017-08-12 ENCOUNTER — Ambulatory Visit: Payer: Medicare Other | Admitting: Adult Health

## 2017-08-14 ENCOUNTER — Encounter: Payer: Self-pay | Admitting: Pulmonary Disease

## 2017-08-14 ENCOUNTER — Ambulatory Visit: Payer: Medicare Other | Admitting: Pulmonary Disease

## 2017-08-14 VITALS — BP 114/78 | HR 82 | Ht 61.0 in | Wt 212.0 lb

## 2017-08-14 DIAGNOSIS — R911 Solitary pulmonary nodule: Secondary | ICD-10-CM

## 2017-08-14 DIAGNOSIS — G4733 Obstructive sleep apnea (adult) (pediatric): Secondary | ICD-10-CM | POA: Diagnosis not present

## 2017-08-14 DIAGNOSIS — J8410 Pulmonary fibrosis, unspecified: Secondary | ICD-10-CM

## 2017-08-14 NOTE — Assessment & Plan Note (Signed)
Weight loss encouraged, compliance with goal of at least 4-6 hrs every night is the expectation. Advised against medications with sedative side effects Cautioned against driving when sleepy - understanding that sleepiness will vary on a day to day basis  

## 2017-08-14 NOTE — Progress Notes (Signed)
   Subjective:    Patient ID: Isabella [name], female    DOB: [birth year], 77 y.o.   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   RLL nodule had been stable since 2013,But growth noted in 2017 Of note this was noted to be PETnegative in 2013, left lower lobe nodule had lighted up and hence this was biopsied and showed initially negative in 04/2012 and then nonnecrotizing granuloma 05/2012 onrepeat biopsy   CT chest from 09/2016 which showed Nodule is stable in size at 15 mm compared to 03/2016 but increased compared to 2015  CT chest 07/2017 mild increase in right lower lobe nodule 15 x 16 mm, other nodules all unchanged   I personally reviewed about CT images and compared with prior images  Her ulcerative colitis symptoms have worsened over the past few months, 6-12 loose bowel movements daily, has been on prednisone for the past few months and is now down to 20 mg    Significant tests/ events  PET 09/6810 Hypermetabolic right lower lobe nodule,SUV 4.0 Scattered pulmonary nodules unchanged from 2015, right upper lobe 6 mm nodule SUV 2.0  03/2016 ENB neg  PFTs 03/2012 nml   Review of Systems Patient denies significant dyspnea,cough, hemoptysis,  chest pain, palpitations, pedal edema, orthopnea, paroxysmal nocturnal dyspnea, lightheadedness, nausea, vomiting, abdominal or  leg pains      Objective:   Physical Exam Gen. Pleasant, well-nourished, in no distress ENT - no thrush, no post nasal drip Neck: No JVD, no thyromegaly, no carotid bruits Lungs: no use of accessory muscles, no dullness to percussion, clear without rales or rhonchi  Cardiovascular: Rhythm regular, heart sounds  normal, no murmurs or gallops, no peripheral edema Musculoskeletal: No deformities, no cyanosis or  clubbing         Assessment & Plan:

## 2017-08-14 NOTE — Patient Instructions (Signed)
Consultation with thoracic surgery, Dr. [name] Based on this, we will decide about biopsy

## 2017-08-14 NOTE — Assessment & Plan Note (Signed)
The right lower lobe nodule has slowly enlarged from 12 mm in 2015 to current size of 16 mm.  This is occurred while on prednisone for ulcerative colitis.  I still favor an inflammatory cause.  Note that biopsy of the left lower lobe nodule in the past that showed granulomas.  However we are obligated to act on this now.  We discussed CT-guided biopsy and high chance of pneumothorax since this  nodule appears to be deeply seated .  We also discussed surgical excision biopsy-she is currently having ulcerative colitis flare and would not want to go through surgery at the current time.  Prior navigation bronchoscopy biopsy has been negative  We will refer her to CT surgery for opinion but it does seem that CT-guided biopsy would be the best way forward

## 2017-08-27 ENCOUNTER — Encounter: Payer: Medicare Other | Admitting: Thoracic Surgery (Cardiothoracic Vascular Surgery)

## 2017-09-02 ENCOUNTER — Encounter: Payer: Self-pay | Admitting: Thoracic Surgery (Cardiothoracic Vascular Surgery)

## 2017-09-02 ENCOUNTER — Other Ambulatory Visit: Payer: Self-pay | Admitting: *Deleted

## 2017-09-02 ENCOUNTER — Other Ambulatory Visit: Payer: Self-pay

## 2017-09-02 ENCOUNTER — Institutional Professional Consult (permissible substitution): Payer: Medicare Other | Admitting: Thoracic Surgery (Cardiothoracic Vascular Surgery)

## 2017-09-02 VITALS — BP 127/79 | HR 77 | Resp 18 | Ht 61.0 in | Wt 211.6 lb

## 2017-09-02 DIAGNOSIS — R911 Solitary pulmonary nodule: Secondary | ICD-10-CM | POA: Diagnosis not present

## 2017-09-02 NOTE — Progress Notes (Signed)
[address]       [place] [address]             [phone]       HPI: Isabella [name] is sent for my opinion regarding possible biopsy of right lower lobe lung nodule  Isabella [name] is a 77 year old woman with a past medical history significant for coronary artery disease, MI, sarcoidosis, reflux, arthritis, hypertension, hyperlipidemia, obesity and sleep apnea.  I saw her back in 2013 she had a relatively large left lower lobe lung mass.  Navigational bronchoscopy was nondiagnostic.  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  That lesion resolved with treatment.  She also had a nodule in the right lower lobe.  It was not hypermetabolic on PET at that time.  We followed that for a couple of years and it did not change.  She had a PET in 2017 which showed the nodule was mildly hypermetabolic with an SUV of 4.0.  She continued to be followed radiographically.  Recently a CT showed increase in the size of the nodule which is now 15 x 16 mm.  Was 11 mm in 2013.  She has not had any respiratory complaints.  She is short of breath with exertion but that is not a new finding.  She has not had any change in appetite or weight loss in fact she has gained weight.  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  Her primary complaints of diarrhea due to ulcerative colitis.  She is on prednisone and mesalamine for that.  Past Medical History:  Diagnosis Date  . Arthritis   . Complication of anesthesia    Took a while to wake up with knee  . Coronary artery disease   . Dyspnea    with walking at times  . Fallen bladder   . GERD (gastroesophageal reflux disease)   . 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 Headache    migraines in her younger years  . Hemorrhoids   . High cholesterol   . History of blood transfusion   . Hx of  adenomatous colonic polyps   . Hypertension   . Lung nodule   . Myocardial infarct ([place]) 03/24/2011   Dr. [name]  . Nocturnal leg cramps   . Obesity   . Sarcoidosis   . Sleep apnea    uses cpap   Past Surgical History:  Procedure Laterality Date  . ABDOMINAL HYSTERECTOMY  1990  . APPENDECTOMY  1970  . BACK SURGERY  2015   lower back  . CARDIAC CATHETERIZATION  03/2011  . CARPAL TUNNEL RELEASE Bilateral 2004  . CATARACT EXTRACTION    . CHOLECYSTECTOMY  1970  . COLONOSCOPY W/ BIOPSIES AND POLYPECTOMY    . CORONARY ANGIOPLASTY  2012  . CORONARY STENT PLACEMENT  03/24/2011   Dr. [name]  . EYE SURGERY  2008,2011   corneal transplant  . FEMORAL ARTERY EXPLORATION  03/25/2011   Procedure: FEMORAL ARTERY EXPLORATION;  Surgeon: Elam Dutch, MD;  Location: [place];  Service: Vascular;  Laterality: Right;  Evacuation of Hematoma   . hematoma surgery     from cardiac cath -hematoma in groin-right  . HERNIA REPAIR  9147   umbilical  . JOINT REPLACEMENT    . KNEE ARTHROSCOPY  2006   Right  . Laser  Surgery Left Eye  2012  . LEFT HEART CATHETERIZATION WITH CORONARY ANGIOGRAM N/A 03/24/2011   Procedure: LEFT HEART CATHETERIZATION WITH CORONARY ANGIOGRAM;  Surgeon: Sinclair Grooms, MD;  Location: [facility] CATH LAB;  Service: Cardiovascular;  Laterality: N/A;  . PERCUTANEOUS CORONARY STENT INTERVENTION (PCI-S) N/A 03/24/2011   Procedure: PERCUTANEOUS CORONARY STENT INTERVENTION (PCI-S);  Surgeon: Sinclair Grooms, MD;  Location: [facility] CATH LAB;  Service: Cardiovascular;  Laterality: N/A;  . SHOULDER SURGERY  2010  . TONSILLECTOMY  1949  . TONSILLECTOMY AND ADENOIDECTOMY    . TOTAL KNEE ARTHROPLASTY  07/2010   Left  . TOTAL KNEE ARTHROPLASTY  06/17/2012   Procedure: TOTAL KNEE ARTHROPLASTY;  Surgeon: Gearlean Alf, MD;  Location: [facility] ORS;  Service: Orthopedics;  Laterality: Right;  . TUBAL LIGATION  1978  . [place], 2007   x 2  . VIDEO BRONCHOSCOPY WITH ENDOBRONCHIAL  NAVIGATION  04/13/2012   Procedure: VIDEO BRONCHOSCOPY WITH ENDOBRONCHIAL NAVIGATION;  Surgeon: Melrose Nakayama, MD;  Location: [place];  Service: Thoracic;  Laterality: N/A;  . VIDEO BRONCHOSCOPY WITH ENDOBRONCHIAL NAVIGATION N/A 04/08/2016   Procedure: VIDEO BRONCHOSCOPY WITH ENDOBRONCHIAL NAVIGATION;  Surgeon: Rigoberto Noel, MD;  Location: [facility] OR;  Service: Thoracic;  Laterality: N/A;    Current Outpatient Medications  Medication Sig Dispense Refill  . acetaminophen (TYLENOL) 325 MG tablet Take 325-650 mg by mouth every 6 (six) hours as needed for mild pain.    Marland Kitchen acetaminophen (TYLENOL) 500 MG tablet Take 500 mg by mouth every 6 (six) hours as needed for mild pain or headache.    Marland Kitchen aspirin 81 MG chewable tablet Chew 81 mg by mouth daily at 3 pm.     . atorvastatin (LIPITOR) 40 MG tablet Take 40 mg by mouth at bedtime.    . chlorpheniramine (CHLOR-TRIMETON) 4 MG tablet Take 4 mg by mouth at bedtime.     . furosemide (LASIX) 40 MG tablet Take 40 mg by mouth daily.     . hydrocortisone 2.5 % cream Apply 1 application topically See admin instructions. 1 application rectally as needed for hemorrhoidal pain  3  . losartan (COZAAR) 50 MG tablet Take 50 mg by mouth daily as needed (ONLY IF BLOOD PRESSURE IS GREATER THAN 110/70).    . mesalamine (APRISO) 0.375 g 24 hr capsule Take 1.5 mg by mouth daily.    . metoprolol tartrate (LOPRESSOR) 25 MG tablet Take 25 mg by mouth See admin instructions. Take 25 mg by mouth two times a day (morning and 3 PM) ONLY IF BLOOD PRESSURE IS GREATER THAN 110/70    . Multiple Vitamins-Minerals (CENTRUM SILVER 50+WOMEN) TABS Take 1 tablet by mouth daily.    . prednisoLONE acetate (PRED FORTE) 1 % ophthalmic suspension Place 1 drop into the left eye 3 (three) times daily.  3  . predniSONE (DELTASONE) 10 MG tablet 15 mg. Take one (1) tablet (10 mg) by mouth daily for two (2) weeks. (Started 06-10-17) Then take half (1/2) tablet (5 mg) by mouth daily for two (2) weeks then  stop.    . Probiotic Product (ALIGN PO) Take by mouth.    . sodium chloride (MURO 128) 5 % ophthalmic solution Place 1 drop into the left eye 3 (three) times daily.    Marland Kitchen UNABLE TO FIND CPAP: At bedtime nightly and during all naps     No current facility-administered medications for this visit.     Physical Exam BP 127/79 (BP Location:  Left Arm, Patient Position: Sitting, Cuff Size: Large)   Pulse 77   Resp 18   Ht 5' 1"  (1.549 m)   Wt 211 lb 9.6 oz (96 kg)   SpO2 98% Comment: RA  BMI 39.98 kg/m  Obese 77 year old woman in no acute distress Alert and oriented x3 with no focal deficits No cervical or supraclavicular adenopathy Lungs clear with equal breath sounds bilaterally Cardiac regular rate and rhythm normal S1-S2 no rubs murmurs or gallops Abdomen soft nontender  Diagnostic Tests: CT CHEST WITHOUT CONTRAST  TECHNIQUE: Multidetector CT imaging of the chest was performed following the standard protocol without IV contrast.  COMPARISON:  Multiple CT chest, including 01/29/2017 and 03/26/2012.  Multiple PET CTs, including 02/09/2016.  FINDINGS: Cardiovascular: The heart is normal in size. No pericardial effusion.  No evidence of thoracic aortic aneurysm. Very mild atherosclerotic calcifications of the aortic arch.  Coronary atherosclerosis of the right coronary artery.  Mediastinum/Nodes: No suspicious mediastinal lymphadenopathy.  Visualized thyroid is notable for a stable 14 mm right thyroid nodule.  Lungs/Pleura: 15 x 16 mm medial right lower lobe nodule (series 3/image 39), previously 15 x 13 mm on most recent CT chest, and previously 11 x 10 mm in 2013. This was FDG avid on prior PET.  7 x 16 mm flat nodule in the left lower lobe (series 3/image 101), previously 8 x 15 mm, unchanged. This is significantly improved from 2013.  Multiple additional scattered bilateral pulmonary nodules in both lungs, many of which are subpleural, grossly  unchanged.  No focal consolidation.  No pleural effusion or pneumothorax.  Upper Abdomen: Visualized upper abdomen is notable for stable hepatic cysts.  Musculoskeletal: Degenerative changes of the visualized thoracolumbar spine.  IMPRESSION: Mild progression of the dominant medial right lower lobe nodule, now measuring 15 x 16 mm.  Additional scattered bilateral pulmonary nodules are grossly unchanged.  Aortic Atherosclerosis (ICD10-I70.0).   Electronically Signed   By: Julian Hy M.D.   On: 07/31/2017 17:36 I personally reviewed the CT images and concur with the findings noted above.  I also reviewed her PET CTs from 2013 and 2017.  Impression: Isabella [name] is a 77 year old lifelong non-smoker with multiple medical problems who has a history of sarcoidosis with a left lung mass.  She had a right lower lobe lung nodule for a long time now.  It has grown slightly over the 6 years since her original PET/CT.  He did not light up on the PET in 2013 when it was 11 mm but did have some mild activity (SUV of 4) on her PET in 2017.  Given the appearance of the nodule with dense solid appearance and smooth welcome circumscribed borders and extremely slow growth rate I do not think this is a non-small cell carcinoma.  We could potentially be a low-grade carcinoid tumor.  It also could be an inflammatory nodule although it would be unusual for her to increase in size while on prednisone.  In any event I think the increase in size does warrant a biopsy.    It is a relatively difficult nodule to approach.  I will see if interventional radiology would be willing to do a CT-guided biopsy.  If possible I think that would be the preferred approach.  There is an airway leading to it and navigational bronchoscopy as possible, but given its central location will be difficult to visualize with fluoroscopy and may lead to poor sampling.  I do not think there is an indication to  proceed with  surgical resection at this point.  Plan: CT guided biopsy.  With IR unwilling to biopsy will plan navigational bronchoscopy.  Melrose Nakayama, MD [facility] [phone]

## 2017-09-11 ENCOUNTER — Other Ambulatory Visit: Payer: Self-pay | Admitting: Radiology

## 2017-09-12 ENCOUNTER — Encounter: Payer: Self-pay | Admitting: *Deleted

## 2017-09-15 ENCOUNTER — Ambulatory Visit (HOSPITAL_COMMUNITY)
Admission: RE | Admit: 2017-09-15 | Discharge: 2017-09-15 | Disposition: A | Discharge status: Home / Self Care | Payer: Medicare Other | Source: Ambulatory Visit | Attending: Thoracic Surgery (Cardiothoracic Vascular Surgery) | Admitting: Thoracic Surgery (Cardiothoracic Vascular Surgery)

## 2017-09-15 ENCOUNTER — Ambulatory Visit (HOSPITAL_COMMUNITY)
Admission: RE | Admit: 2017-09-15 | Discharge: 2017-09-15 | Disposition: A | Discharge status: Home / Self Care | Payer: Medicare Other | Source: Ambulatory Visit | Attending: Interventional Radiology | Admitting: Interventional Radiology

## 2017-09-15 ENCOUNTER — Encounter (HOSPITAL_COMMUNITY): Payer: Self-pay

## 2017-09-15 DIAGNOSIS — Z9889 Other specified postprocedural states: Secondary | ICD-10-CM

## 2017-09-15 DIAGNOSIS — R911 Solitary pulmonary nodule: Secondary | ICD-10-CM | POA: Insufficient documentation

## 2017-09-15 LAB — CBC
HCT: 37.1 % (ref 36.0–46.0)
Hemoglobin: 11.3 g/dL — ABNORMAL LOW (ref 12.0–15.0)
MCH: 25.7 pg — ABNORMAL LOW (ref 26.0–34.0)
MCHC: 30.5 g/dL (ref 30.0–36.0)
MCV: 84.3 fL (ref 78.0–100.0)
PLATELETS: 300 10*3/uL (ref 150–400)
RBC: 4.4 MIL/uL (ref 3.87–5.11)
RDW: 18.3 % — AB (ref 11.5–15.5)
WBC: 8.4 10*3/uL (ref 4.0–10.5)

## 2017-09-15 LAB — PROTIME-INR
INR: 1.09
PROTHROMBIN TIME: 14 s (ref 11.4–15.2)

## 2017-09-15 MED ORDER — FENTANYL CITRATE (PF) 100 MCG/2ML IJ SOLN
INTRAMUSCULAR | Status: AC
  Filled 2017-09-15: qty 4

## 2017-09-15 MED ORDER — LIDOCAINE HCL 1 % IJ SOLN
INTRAMUSCULAR | Status: AC
  Filled 2017-09-15: qty 20

## 2017-09-15 MED ORDER — SODIUM CHLORIDE 0.9 % IV SOLN
INTRAVENOUS | Status: DC
Start: 2017-09-15 — End: 2017-09-16

## 2017-09-15 MED ORDER — MIDAZOLAM HCL 2 MG/2ML IJ SOLN
INTRAMUSCULAR | Status: AC
  Filled 2017-09-15: qty 4

## 2017-09-15 MED ORDER — MIDAZOLAM HCL 2 MG/2ML IJ SOLN
INTRAMUSCULAR | Status: AC | PRN
  Administered 2017-09-15: 0.5 mg via INTRAVENOUS

## 2017-09-15 MED ORDER — FENTANYL CITRATE (PF) 100 MCG/2ML IJ SOLN
INTRAMUSCULAR | Status: AC | PRN
  Administered 2017-09-15: 25 ug via INTRAVENOUS

## 2017-09-15 NOTE — Progress Notes (Signed)
Dr Pascal Lux notified of CXR results and per Dr Pascal Lux ok for client to take PO's and d/c home

## 2017-09-15 NOTE — Discharge Instructions (Signed)
**Note -Identified via Obfuscation** Lung Biopsy, Care After This sheet gives you information about how to care for yourself after your procedure. Your health care provider may also give you more specific instructions depending on the type of biopsy you had. If you have problems or questions, contact your health care provider. What can I expect after the procedure? After the procedure, it is common to have:  A cough.  A sore throat.  Pain where a needle, bronchoscope, or incision was used to collect a biopsy sample (biopsy site).  Follow these instructions at home: Medicines  Take over-the-counter and prescription medicines only as told by your health care provider.  Do not drive for 24 hours if you were given a sedative.  Do not drink alcohol while taking pain medicine.  Do not drive or use heavy machinery while taking prescription pain medicine.  To prevent or treat constipation while you are taking prescription pain medicine, your health care provider may recommend that you: ? Drink enough fluid to keep your urine clear or pale yellow. ? Take over-the-counter or prescription medicines. ? Eat foods that are high in fiber, such as fresh fruits and vegetables, whole grains, and beans. ? Limit foods that are high in fat and processed sugars, such as fried and sweet foods. Activity  If you had an incision during your procedure, avoid activities that may pull the incision site open.  Return to your normal activities as told by your health care provider. Ask your health care provider what activities are safe for you. If you had an open biopsy:   Follow instructions from your health care provider about how to take care of your incision. Make sure you: ? Wash your hands with soap and water before you change your bandage (dressing). If soap and water are not available, use hand sanitizer. ? Change your dressing as told by your health care provider. ? Leave stitches (sutures), skin glue, or adhesive strips in place. These  skin closures may need to stay in place for 2 weeks or longer. If adhesive strip edges start to loosen and curl up, you may trim the loose edges. Do not remove adhesive strips completely unless your health care provider tells you to do that.  Check your incision area every day for signs of infection. Check for: ? Redness, swelling, or pain. ? Fluid or blood. ? Warmth. ? Pus or a bad smell. General instructions  It is up to you to get the results of your procedure. Ask your health care provider, or the department that is doing the procedure, when your results will be ready. Contact a health care provider if:  You have a fever.  You have redness, swelling, or pain around your biopsy site.  You have fluid or blood coming from your biopsy site.  Your biopsy site feels warm to the touch.  You have pus or a bad smell coming from your biopsy site. Get help right away if:  You cough up blood.  You have trouble breathing.  You have chest pain. Summary  After the procedure, it is common to have a sore throat and a cough.  Return to your normal activities as told by your health care provider. Ask your health care provider what activities are safe for you.  Take over-the-counter and prescription medicines only as told by your health care provider.  Report any unusual symptoms to your health care provider. This information is not intended to replace advice given to you by your health care provider. Make sure **Note -Identified via Obfuscation** you discuss any questions you have with your health care provider. Document Released: 05/28/2016 Document Revised: 05/28/2016 Document Reviewed: 05/28/2016 Elsevier Interactive Patient Education  [employer].

## 2017-09-15 NOTE — H&P (Signed)
Chief Complaint: Patient was seen in consultation today for No chief complaint on file.  at the request of [place] C  Referring Physician(s): Lawai C  Supervising Physician: Sandi Mariscal  Patient Status: [facility] - Out-pt  History of Present Illness: Isabella [name] is a 77 y.o. female    Dr [name] note 09/02/17: I saw her back in 2013 she had a relatively large left lower lobe lung mass.  Navigational bronchoscopy was nondiagnostic.  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  That lesion resolved with treatment.  She also had a nodule in the right lower lobe.  It was not hypermetabolic on PET at that time.  We followed that for a couple of years and it did not change.  She had a PET in 2017 which showed the nodule was mildly hypermetabolic with an SUV of 4.0.  She continued to be followed radiographically.  Recently a CT showed increase in the size of the nodule which is now 15 x 16 mm.  Was 11 mm in 2013.  CT 3/21: IMPRESSION: Mild progression of the dominant medial right lower lobe nodule, now measuring 15 x 16 mm. Additional scattered bilateral pulmonary nodules are grossly Unchanged.  Scheduled now for biopsy of same    Past Medical History:  Diagnosis Date  . Arthritis   . Complication of anesthesia    Took a while to wake up with knee  . Coronary artery disease   . Dyspnea    with walking at times  . Fallen bladder   . GERD (gastroesophageal reflux disease)   . 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 Headache    migraines in her younger years  . Hemorrhoids   . High cholesterol   . History of blood transfusion   . Hx of adenomatous colonic polyps   . Hypertension   . Lung nodule   . Myocardial infarct ([place]) 03/24/2011   Dr. [name]  . Nocturnal leg cramps   . Obesity   . Sarcoidosis   . Sleep apnea    uses cpap    Past  Surgical History:  Procedure Laterality Date  . ABDOMINAL HYSTERECTOMY  1990  . APPENDECTOMY  1970  . BACK SURGERY  2015   lower back  . CARDIAC CATHETERIZATION  03/2011  . CARPAL TUNNEL RELEASE Bilateral 2004  . CATARACT EXTRACTION    . CHOLECYSTECTOMY  1970  . COLONOSCOPY W/ BIOPSIES AND POLYPECTOMY    . CORONARY ANGIOPLASTY  2012  . CORONARY STENT PLACEMENT  03/24/2011   Dr. [name]  . EYE SURGERY  2008,2011   corneal transplant  . FEMORAL ARTERY EXPLORATION  03/25/2011   Procedure: FEMORAL ARTERY EXPLORATION;  Surgeon: Elam Dutch, MD;  Location: [place];  Service: Vascular;  Laterality: Right;  Evacuation of Hematoma   . hematoma surgery     from cardiac cath -hematoma in groin-right  . HERNIA REPAIR  0277   umbilical  . JOINT REPLACEMENT    . KNEE ARTHROSCOPY  2006   Right  . [place] Surgery Left Eye  2012  . LEFT HEART CATHETERIZATION WITH CORONARY ANGIOGRAM N/A 03/24/2011   Procedure: LEFT HEART CATHETERIZATION WITH CORONARY ANGIOGRAM;  Surgeon: Sinclair Grooms, MD;  Location: [facility] CATH LAB;  Service: Cardiovascular;  Laterality: N/A;  . PERCUTANEOUS CORONARY STENT INTERVENTION (PCI-S) N/A 03/24/2011   Procedure: PERCUTANEOUS CORONARY STENT INTERVENTION (PCI-S);  Surgeon:  Sinclair Grooms, MD;  Location: [facility] C[facility] LAB;  Service: Cardiovascular;  Laterality: N/A;  . SHOULDER SURGERY  2010  . TONSILLECTOMY  1949  . TONSILLECTOMY AND ADENOIDECTOMY    . TOTAL KNEE ARTHROPLASTY  07/2010   Left  . TOTAL KNEE ARTHROPLASTY  06/17/2012   Procedure: TOTAL KNEE ARTHROPLASTY;  Surgeon: Gearlean Alf, MD;  Location: [facility] ORS;  Service: Orthopedics;  Laterality: Right;  . TUBAL LIGATION  1978  . [place], 2007   x 2  . VIDEO BRONCHOSCOPY WITH ENDOBRONCHIAL NAVIGATION  04/13/2012   Procedure: VIDEO BRONCHOSCOPY WITH ENDOBRONCHIAL NAVIGATION;  Surgeon: Melrose Nakayama, MD;  Location: [place];  Service: Thoracic;  Laterality: N/A;  . VIDEO BRONCHOSCOPY WITH  ENDOBRONCHIAL NAVIGATION N/A 04/08/2016   Procedure: VIDEO BRONCHOSCOPY WITH ENDOBRONCHIAL NAVIGATION;  Surgeon: Rigoberto Noel, MD;  Location: [place];  Service: Thoracic;  Laterality: N/A;    Allergies: Imodium a-d [loperamide hcl]; Penicillins; Tramadol; Zestril [lisinopril]; Neomycin; Adhesive [tape]; Codeine; Erythromycin; Hydromorphone; and Percocet [oxycodone-acetaminophen]  Medications: Prior to Admission medications   Medication Sig Start Date End Date Taking? Authorizing Provider  acetaminophen (TYLENOL) 325 MG tablet Take 325-650 mg by mouth every 6 (six) hours as needed for mild pain.   Yes [provider]  acetaminophen (TYLENOL) 500 MG tablet Take 500 mg by mouth every 6 (six) hours as needed for mild pain or headache.   Yes [provider]  aspirin 81 MG chewable tablet Chew 81 mg by mouth daily at 3 pm.    Yes [provider]  atorvastatin (LIPITOR) 40 MG tablet Take 40 mg by mouth at bedtime.   Yes [provider]  chlorpheniramine (CHLOR-TRIMETON) 4 MG tablet Take 8 mg by mouth at bedtime.    Yes [provider]  furosemide (LASIX) 40 MG tablet Take 40 mg by mouth daily.    Yes [provider]  hydrocortisone 2.5 % cream Apply 1 application topically daily as needed (for hemorrhoidal pain.). 1 application rectally as needed for hemorrhoidal pain 03/08/17  Yes [provider]  losartan (COZAAR) 50 MG tablet Take 50 mg by mouth daily as needed (ONLY IF BLOOD PRESSURE IS GREATER THAN 110/70).   Yes [provider]  mesalamine (APRISO) 0.375 g 24 hr capsule Take 1.5 mg by mouth daily at 2 PM.    Yes [provider]  metoprolol tartrate (LOPRESSOR) 25 MG tablet Take 25 mg by mouth See admin instructions. Take 25 mg by mouth two times a day (morning and 3 PM) ONLY IF BLOOD PRESSURE IS GREATER THAN 110/70   Yes [provider]  Multiple Vitamins-Minerals (CENTRUM SILVER 50+WOMEN) TABS Take 1 tablet  by mouth daily with lunch.    Yes [provider]  prednisoLONE acetate (PRED FORTE) 1 % ophthalmic suspension Place 1 drop into the left eye 3 (three) times daily. 05/15/17  Yes [provider]  predniSONE (DELTASONE) 10 MG tablet Take 5-15 mg by mouth See admin instructions. Take one (1) tablet (10 mg) by mouth daily for two (2) weeks. (Starting 09-06-17) Then take half (1/2) tablet (5 mg) by mouth every other day for two (2) weeks then stop. 03/24/17  Yes [provider]  predniSONE (DELTASONE) 10 MG tablet Take 20 mg by mouth daily with breakfast.   Yes [provider]  Probiotic Product (ALIGN PO) Take 1 capsule by mouth daily at 2 PM.    Yes [provider]  sodium chloride (MURO  128) 5 % ophthalmic solution Place 1 drop into the left eye 2 (two) times daily.    Yes [provider]  UNABLE TO FIND CPAP: At bedtime nightly and during all naps   Yes [provider]  Tofacitinib Citrate (XELJANZ PO) Take by mouth.    [provider]     Family History  Problem Relation Age of Onset  . Hypertension Mother   . Cancer Father   . Breast cancer Maternal Grandmother     Social History   Socioeconomic History  . Marital status: Widowed    Spouse name: Not on file  . Number of children: Not on file  . Years of education: Not on file  . Highest education level: Not on file  Occupational History  . Occupation: Retired    Fish farm manager: RETIRED  Social Needs  . Financial resource strain: Not on file  . Food insecurity:    Worry: Not on file    Inability: Not on file  . Transportation needs:    Medical: Not on file    Non-medical: Not on file  Tobacco Use  . Smoking status: Never Smoker  . Smokeless tobacco: Never Used  Substance and Sexual Activity  . Alcohol use: No  . Drug use: No  . Sexual activity: Not on file  Lifestyle  . Physical activity:    Days per week: Not on file    Minutes per session: Not on file  .  Stress: Not on file  Relationships  . Social connections:    Talks on phone: Not on file    Gets together: Not on file    Attends religious service: Not on file    Active member of club or organization: Not on file    Attends meetings of clubs or organizations: Not on file    Relationship status: Not on file  Other Topics Concern  . Not on file  Social History Narrative   Tobacco use cigarettes: never smoked, tobacco history last updated 12/23/2013. No smoking. No alcohol. Caffeine: yes, coffee, 3 servings daily.recreational drug use: never. No diet. Exercise: walks everyday. Occupation: retired. Marital status: married. Children: boys, 2 girls, 2. Seat belt use: yes.    Review of Systems: A 12 point ROS discussed and pertinent positives are indicated in the HPI above.  All other systems are negative.  Review of Systems  Constitutional: Negative for activity change, fatigue and fever.  Respiratory: Negative for cough and shortness of breath.   Cardiovascular: Negative for chest pain.  Gastrointestinal: Negative for abdominal pain.  Musculoskeletal: Negative for back pain.  Neurological: Negative for weakness.  Psychiatric/Behavioral: Negative for behavioral problems and confusion.    Vital Signs: BP (!) 123/50   Pulse 83   Temp 98 F (36.7 C)   Resp 18   Ht 5' 2"  (1.575 m)   Wt 204 lb (92.5 kg)   SpO2 100%   BMI 37.31 kg/m   Physical Exam  Constitutional: She is oriented to person, place, and time.  Cardiovascular: Normal rate and regular rhythm.  Pulmonary/Chest: Effort normal and breath sounds normal.  Abdominal: Soft. Bowel sounds are normal.  Musculoskeletal: Normal range of motion.  Neurological: She is alert and oriented to person, place, and time.  Skin: Skin is warm and dry.  Psychiatric: She has a normal mood and affect. Her behavior is normal. Judgment and thought content normal.  Nursing note and vitals reviewed.   Imaging: No results  found.  Labs:  CBC:  Recent Labs    05/20/17 2110 09/15/17 1011  WBC 8.4 8.4  HGB 10.9* 11.3*  HCT 37.5 37.1  PLT 298 300    COAGS: Recent Labs    09/15/17 1011  INR 1.09    BMP: Recent Labs    05/20/17 2110  NA 141  K 3.4*  CL 103  CO2 31  GLUCOSE 103*  BUN 17  CALCIUM 8.5*  CREATININE 0.91  GFRNONAA >60  GFRAA >60    LIVER FUNCTION TESTS: No results for input(s): BILITOT, AST, ALT, ALKPHOS, PROT, ALBUMIN in the last 8760 hours.  TUMOR MARKERS: No results for input(s): AFPTM, CEA, CA199, CHROMGRNA in the last 8760 hours.  Assessment and Plan:  2013 LLL mass: + sarcoid Right lung mass has enlarged Now for biopsy per Dr [name] Risks and benefits discussed with the patient including, but not limited to bleeding, hemoptysis, respiratory failure requiring intubation, infection, pneumothorax requiring chest tube placement, stroke from air embolism or even death.  All of the patient's questions were answered, patient is agreeable to proceed. Consent signed and in chart.   Thank you for this interesting consult.  I greatly enjoyed meeting Isabella [name] and look forward to participating in their care.  A copy of this report was sent to the requesting provider on this date.  Electronically Signed: Lavonia Drafts, PA-C 09/15/2017, 10:35 AM   I spent a total of  30 Minutes   in face to face in clinical consultation, greater than 50% of which was counseling/coordinating care for right lung mass bx

## 2017-09-15 NOTE — Procedures (Signed)
Pre procedural Dx: Enlarging right lower lobe pulmonary nodule  Post procedural Dx: Same  Technically successful CT guided biopsy of indeterminate nodule within the medial basilar aspect of the right lower lobe.    EBL: Minimal.   Complications: Procedure complicated small amount of self limited hemoptysis, not requiring intervention.   Ronny Bacon, MD Pager #: [phone]

## 2017-09-23 ENCOUNTER — Other Ambulatory Visit: Payer: Self-pay

## 2017-09-23 ENCOUNTER — Encounter: Payer: Self-pay | Admitting: Thoracic Surgery (Cardiothoracic Vascular Surgery)

## 2017-09-23 ENCOUNTER — Ambulatory Visit: Payer: Medicare Other | Admitting: Thoracic Surgery (Cardiothoracic Vascular Surgery)

## 2017-09-23 VITALS — BP 129/77 | HR 95 | Resp 18 | Ht 61.0 in | Wt 211.0 lb

## 2017-09-23 DIAGNOSIS — R911 Solitary pulmonary nodule: Secondary | ICD-10-CM | POA: Diagnosis not present

## 2017-09-23 NOTE — Progress Notes (Signed)
[address]       [place] [address]             [phone]       HPI: Mrs Lehnen returns for follow-up after her needle biopsy.  Isabella [name] is a 77 year old woman with a past history of sarcoidosis, ulcerative colitis, coronary artery disease, MI, hypertension, hyperlipidemia, obesity, sleep apnea, reflux, and arthritis.  In 2013 she had a left lower lobe lung mass.  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  That mass resolved over time.  She is also had a nodule in her right lower lobe.  It was not hypermetabolic in 4503.  We followed that for a couple of years without change.  A PET in 2017 showed the nodule was mildly hypermetabolic.  She was followed radiographically.  The nodule has now increased in size to 15 mm.  It was 11 mm in 2013.  I saw her in the office on 09/02/2017 and recommended a CT-guided needle biopsy.  She is now here to discuss the results.  She is not having any respiratory issues.  She continues to have severe problems with ulcerative colitis.  Currently is on 20 mg of prednisone a day.  Past Medical History:  Diagnosis Date  . Arthritis   . Complication of anesthesia    Took a while to wake up with knee  . Coronary artery disease   . Dyspnea    with walking at times  . Fallen bladder   . GERD (gastroesophageal reflux disease)   . 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 Headache    migraines in her younger years  . Hemorrhoids   . High cholesterol   . History of blood transfusion   . Hx of adenomatous colonic polyps   . Hypertension   . Lung nodule   . Myocardial infarct ([place]) 03/24/2011   Dr. [name]  . Nocturnal leg cramps   . Obesity   . Sarcoidosis   . Sleep apnea    uses cpap    Current Outpatient Medications  Medication Sig Dispense Refill  . acetaminophen (TYLENOL) 325 MG tablet Take 325-650 mg by mouth  every 6 (six) hours as needed for mild pain.    Marland Kitchen aspirin 81 MG chewable tablet Chew 81 mg by mouth daily at 3 pm.     . atorvastatin (LIPITOR) 40 MG tablet Take 40 mg by mouth at bedtime.    . chlorpheniramine (CHLOR-TRIMETON) 4 MG tablet Take 8 mg by mouth at bedtime.     . furosemide (LASIX) 40 MG tablet Take 40 mg by mouth daily.     . hydrocortisone 2.5 % cream Apply 1 application topically daily as needed (for hemorrhoidal pain.). 1 application rectally as needed for hemorrhoidal pain  3  . losartan (COZAAR) 50 MG tablet Take 50 mg by mouth daily as needed (ONLY IF BLOOD PRESSURE IS GREATER THAN 110/70).    . mesalamine (APRISO) 0.375 g 24 hr capsule Take 1.5 mg by mouth daily at 2 PM.     . metoprolol tartrate (LOPRESSOR) 25 MG tablet Take 25 mg by mouth See admin instructions. Take 25 mg by mouth two times a day (morning and 3 PM) ONLY IF BLOOD PRESSURE IS GREATER THAN 110/70    . Multiple Vitamins-Minerals (CENTRUM SILVER 50+WOMEN) TABS Take 1 tablet by mouth daily with lunch.     Marland Kitchen  prednisoLONE acetate (PRED FORTE) 1 % ophthalmic suspension Place 1 drop into the left eye 3 (three) times daily.  3  . predniSONE (DELTASONE) 10 MG tablet Take 20 mg by mouth daily with breakfast.    . Probiotic Product (ALIGN PO) Take 1 capsule by mouth daily at 2 PM.     . sodium chloride (MURO 128) 5 % ophthalmic solution Place 1 drop into the left eye 2 (two) times daily.     . Tofacitinib Citrate (XELJANZ PO) Take by mouth.    Marland Kitchen UNABLE TO FIND CPAP: At bedtime nightly and during all naps     No current facility-administered medications for this visit.     Physical Exam BP 129/77 (BP Location: Left Arm, Patient Position: Sitting, Cuff Size: Large)   Pulse 95   Resp 18   Ht 5' 1"  (1.549 m)   Wt 211 lb (95.7 kg)   SpO2 97% Comment: ON RA  BMI 39.21 kg/m  77 year old woman in no acute distress Alert and oriented x3 with no focal deficits Lungs clear with equal breath sounds  bilaterally  Diagnostic Tests: Pathology Diagnosis Lung, biopsy, RLL - MINUTE FRAGMENT OF LUNG TISSUE WITH HEMORRHAGE, FIBRIN, CHRONIC INFLAMMATION AND PIGMENTED HISTIOCYTES - BENIGN BRONCHIAL MUCOSA - NO MALIGNANCY IDENTIFIED DAWN BUTLER MD Pathologist, Electronic Signature (Case signed 09/16/2017)  Impression: Mrs. Bassinger is a 77 year old woman who has a history of sarcoidosis.  She has a 1.5 cm well-circumscribed right lower lobe nodule that was mildly hypermetabolic on a PET CT from 2017.  It has not grown significantly over the past 2 years, but is larger than it was when first noted back in 2013.  I do not think this lesion is a non-small cell carcinoma.  I favor either being a low-grade carcinoid tumor or inflammatory in nature.  Biopsy showed chronic inflammation but was not definitive for noncaseating granulomas.  I do not think that rules in or out either possibility.  I discussed potential options with Mrs. Grassia including surgical resection versus continued radiographic follow-up.  Given her significant gastrointestinal issues currently she does not want to undergo a surgical procedure.  Plan: Return in 6 months with CT chest  Melrose Nakayama, MD [facility] [phone]

## 2017-09-29 ENCOUNTER — Inpatient Hospital Stay (HOSPITAL_COMMUNITY)
Admission: EM | Admit: 2017-09-29 | Discharge: 2017-10-05 | DRG: 386 | Disposition: A | Discharge status: Home / Self Care | Payer: Medicare Other | Attending: Internal Medicine | Admitting: Internal Medicine

## 2017-09-29 ENCOUNTER — Other Ambulatory Visit: Payer: Self-pay

## 2017-09-29 ENCOUNTER — Encounter (HOSPITAL_COMMUNITY): Payer: Self-pay

## 2017-09-29 DIAGNOSIS — Z79899 Other long term (current) drug therapy: Secondary | ICD-10-CM

## 2017-09-29 DIAGNOSIS — Z955 Presence of coronary angioplasty implant and graft: Secondary | ICD-10-CM

## 2017-09-29 DIAGNOSIS — E861 Hypovolemia: Secondary | ICD-10-CM | POA: Diagnosis present

## 2017-09-29 DIAGNOSIS — R159 Full incontinence of feces: Secondary | ICD-10-CM | POA: Diagnosis present

## 2017-09-29 DIAGNOSIS — Z885 Allergy status to narcotic agent status: Secondary | ICD-10-CM

## 2017-09-29 DIAGNOSIS — E860 Dehydration: Secondary | ICD-10-CM | POA: Diagnosis not present

## 2017-09-29 DIAGNOSIS — K921 Melena: Secondary | ICD-10-CM | POA: Diagnosis not present

## 2017-09-29 DIAGNOSIS — I252 Old myocardial infarction: Secondary | ICD-10-CM

## 2017-09-29 DIAGNOSIS — M1990 Unspecified osteoarthritis, unspecified site: Secondary | ICD-10-CM | POA: Diagnosis present

## 2017-09-29 DIAGNOSIS — K51811 Other ulcerative colitis with rectal bleeding: Secondary | ICD-10-CM | POA: Diagnosis not present

## 2017-09-29 DIAGNOSIS — E785 Hyperlipidemia, unspecified: Secondary | ICD-10-CM | POA: Diagnosis present

## 2017-09-29 DIAGNOSIS — Z880 Allergy status to penicillin: Secondary | ICD-10-CM

## 2017-09-29 DIAGNOSIS — E876 Hypokalemia: Secondary | ICD-10-CM | POA: Diagnosis present

## 2017-09-29 DIAGNOSIS — K51911 Ulcerative colitis, unspecified with rectal bleeding: Secondary | ICD-10-CM | POA: Diagnosis not present

## 2017-09-29 DIAGNOSIS — I2510 Atherosclerotic heart disease of native coronary artery without angina pectoris: Secondary | ICD-10-CM | POA: Diagnosis present

## 2017-09-29 DIAGNOSIS — I130 Hypertensive heart and chronic kidney disease with heart failure and stage 1 through stage 4 chronic kidney disease, or unspecified chronic kidney disease: Secondary | ICD-10-CM | POA: Diagnosis present

## 2017-09-29 DIAGNOSIS — R001 Bradycardia, unspecified: Secondary | ICD-10-CM | POA: Diagnosis present

## 2017-09-29 DIAGNOSIS — I5032 Chronic diastolic (congestive) heart failure: Secondary | ICD-10-CM | POA: Diagnosis present

## 2017-09-29 DIAGNOSIS — N8110 Cystocele, unspecified: Secondary | ICD-10-CM | POA: Diagnosis present

## 2017-09-29 DIAGNOSIS — Z8249 Family history of ischemic heart disease and other diseases of the circulatory system: Secondary | ICD-10-CM

## 2017-09-29 DIAGNOSIS — Z7952 Long term (current) use of systemic steroids: Secondary | ICD-10-CM

## 2017-09-29 DIAGNOSIS — R197 Diarrhea, unspecified: Secondary | ICD-10-CM

## 2017-09-29 DIAGNOSIS — Z91030 Bee allergy status: Secondary | ICD-10-CM

## 2017-09-29 DIAGNOSIS — G4733 Obstructive sleep apnea (adult) (pediatric): Secondary | ICD-10-CM | POA: Diagnosis present

## 2017-09-29 DIAGNOSIS — D62 Acute posthemorrhagic anemia: Secondary | ICD-10-CM | POA: Diagnosis present

## 2017-09-29 DIAGNOSIS — N183 Chronic kidney disease, stage 3 (moderate): Secondary | ICD-10-CM | POA: Diagnosis present

## 2017-09-29 DIAGNOSIS — K529 Noninfective gastroenteritis and colitis, unspecified: Secondary | ICD-10-CM | POA: Diagnosis not present

## 2017-09-29 DIAGNOSIS — Z9049 Acquired absence of other specified parts of digestive tract: Secondary | ICD-10-CM

## 2017-09-29 DIAGNOSIS — K5190 Ulcerative colitis, unspecified, without complications: Principal | ICD-10-CM | POA: Diagnosis present

## 2017-09-29 DIAGNOSIS — N179 Acute kidney failure, unspecified: Secondary | ICD-10-CM | POA: Diagnosis present

## 2017-09-29 DIAGNOSIS — K219 Gastro-esophageal reflux disease without esophagitis: Secondary | ICD-10-CM

## 2017-09-29 DIAGNOSIS — Z96653 Presence of artificial knee joint, bilateral: Secondary | ICD-10-CM | POA: Diagnosis present

## 2017-09-29 DIAGNOSIS — J8410 Pulmonary fibrosis, unspecified: Secondary | ICD-10-CM | POA: Diagnosis present

## 2017-09-29 DIAGNOSIS — Z888 Allergy status to other drugs, medicaments and biological substances status: Secondary | ICD-10-CM

## 2017-09-29 DIAGNOSIS — Z881 Allergy status to other antibiotic agents status: Secondary | ICD-10-CM

## 2017-09-29 DIAGNOSIS — Z90710 Acquired absence of both cervix and uterus: Secondary | ICD-10-CM

## 2017-09-29 DIAGNOSIS — D869 Sarcoidosis, unspecified: Secondary | ICD-10-CM | POA: Diagnosis present

## 2017-09-29 DIAGNOSIS — R1084 Generalized abdominal pain: Secondary | ICD-10-CM | POA: Diagnosis not present

## 2017-09-29 DIAGNOSIS — I10 Essential (primary) hypertension: Secondary | ICD-10-CM | POA: Diagnosis not present

## 2017-09-29 DIAGNOSIS — Z86010 Personal history of colonic polyps: Secondary | ICD-10-CM

## 2017-09-29 DIAGNOSIS — Z7982 Long term (current) use of aspirin: Secondary | ICD-10-CM

## 2017-09-29 LAB — BASIC METABOLIC PANEL
ANION GAP: 16 — AB (ref 5–15)
Anion gap: 10 (ref 5–15)
Anion gap: 7 (ref 5–15)
BUN: 10 mg/dL (ref 6–20)
BUN: 11 mg/dL (ref 6–20)
BUN: 10 mg/dL (ref 6–20)
CALCIUM: 8.6 mg/dL — AB (ref 8.9–10.3)
CALCIUM: 7.6 mg/dL — AB (ref 8.9–10.3)
CO2: 28 mmol/L (ref 22–32)
CO2: 27 mmol/L (ref 22–32)
CO2: 29 mmol/L (ref 22–32)
CREATININE: 1.34 mg/dL — AB (ref 0.44–1.00)
CREATININE: 1.3 mg/dL — AB (ref 0.44–1.00)
Calcium: 7.6 mg/dL — ABNORMAL LOW (ref 8.9–10.3)
Chloride: 89 mmol/L — ABNORMAL LOW (ref 101–111)
Chloride: 100 mmol/L — ABNORMAL LOW (ref 101–111)
Chloride: 101 mmol/L (ref 101–111)
Creatinine, Ser: 1.47 mg/dL — ABNORMAL HIGH (ref 0.44–1.00)
GFR calc Af Amer: 39 mL/min — ABNORMAL LOW (ref >60)
GFR calc Af Amer: 44 mL/min — ABNORMAL LOW (ref >60)
GFR calc Af Amer: 46 mL/min — ABNORMAL LOW (ref >60)
GFR, EST NON AFRICAN AMERICAN: 34 mL/min — AB (ref >60)
GFR, EST NON AFRICAN AMERICAN: 38 mL/min — AB (ref >60)
GFR, EST NON AFRICAN AMERICAN: 39 mL/min — AB (ref >60)
GLUCOSE: 104 mg/dL — AB (ref 65–99)
GLUCOSE: 112 mg/dL — AB (ref 65–99)
GLUCOSE: 170 mg/dL — AB (ref 65–99)
POTASSIUM: 3.1 mmol/L — AB (ref 3.5–5.1)
POTASSIUM: 4.1 mmol/L (ref 3.5–5.1)
Potassium: 2.6 mmol/L — CL (ref 3.5–5.1)
SODIUM: 137 mmol/L (ref 135–145)
Sodium: 133 mmol/L — ABNORMAL LOW (ref 135–145)
Sodium: 137 mmol/L (ref 135–145)

## 2017-09-29 LAB — CBC
HCT: 38.7 % (ref 36.0–46.0)
HEMOGLOBIN: 12.1 g/dL (ref 12.0–15.0)
MCH: 25.6 pg — ABNORMAL LOW (ref 26.0–34.0)
MCHC: 31.3 g/dL (ref 30.0–36.0)
MCV: 81.8 fL (ref 78.0–100.0)
PLATELETS: 576 10*3/uL — AB (ref 150–400)
RBC: 4.73 MIL/uL (ref 3.87–5.11)
RDW: 18 % — ABNORMAL HIGH (ref 11.5–15.5)
WBC: 11.4 10*3/uL — ABNORMAL HIGH (ref 4.0–10.5)

## 2017-09-29 LAB — MAGNESIUM: Magnesium: 1.8 mg/dL (ref 1.7–2.4)

## 2017-09-29 LAB — CBG MONITORING, ED: GLUCOSE-CAPILLARY: 106 mg/dL — AB (ref 65–99)

## 2017-09-29 LAB — LIPASE, BLOOD: LIPASE: 44 U/L (ref 11–51)

## 2017-09-29 MED ORDER — SODIUM CHLORIDE 0.9 % IV SOLN
INTRAVENOUS | Status: DC
  Administered 2017-09-29 – 2017-10-01 (×5): via INTRAVENOUS

## 2017-09-29 MED ORDER — ONDANSETRON HCL 4 MG/2ML IJ SOLN: 4.0000 mg | Freq: Four times a day (QID) | INTRAMUSCULAR | Status: DC | PRN

## 2017-09-29 MED ORDER — ONDANSETRON HCL 4 MG PO TABS: 4.0000 mg | ORAL_TABLET | Freq: Four times a day (QID) | ORAL | Status: DC | PRN

## 2017-09-29 MED ORDER — TOFACITINIB CITRATE 10 MG PO TABS
10.0000 mg | ORAL_TABLET | Freq: Two times a day (BID) | ORAL | Status: DC
  Administered 2017-09-30 – 2017-10-05 (×11): 10 mg via ORAL
  Filled 2017-09-29 (×13): qty 1

## 2017-09-29 MED ORDER — POTASSIUM CHLORIDE 10 MEQ/100ML IV SOLN
10.0000 meq | Freq: Once | INTRAVENOUS | Status: AC
  Administered 2017-09-29: 10 meq via INTRAVENOUS
  Filled 2017-09-29: qty 100

## 2017-09-29 MED ORDER — POTASSIUM CHLORIDE CRYS ER 20 MEQ PO TBCR
40.0000 meq | EXTENDED_RELEASE_TABLET | Freq: Once | ORAL | Status: AC
  Administered 2017-09-29: 40 meq via ORAL
  Filled 2017-09-29: qty 2

## 2017-09-29 MED ORDER — SODIUM CHLORIDE 0.9 % IV BOLUS
1000.0000 mL | Freq: Once | INTRAVENOUS | Status: AC
  Administered 2017-09-29: 1000 mL via INTRAVENOUS

## 2017-09-29 MED ORDER — BISACODYL 10 MG RE SUPP: 10.0000 mg | Freq: Every day | RECTAL | Status: DC | PRN

## 2017-09-29 MED ORDER — PROMETHAZINE HCL 25 MG/ML IJ SOLN
12.5000 mg | Freq: Four times a day (QID) | INTRAMUSCULAR | Status: DC | PRN
Start: 2017-09-29 — End: 2017-10-05

## 2017-09-29 MED ORDER — HYDROCORTISONE 2.5 % RE CREA
1.0000 "application " | TOPICAL_CREAM | Freq: Every day | RECTAL | Status: DC | PRN
  Administered 2017-10-02: 1 via TOPICAL
  Filled 2017-09-29 (×2): qty 28.35

## 2017-09-29 MED ORDER — ATORVASTATIN CALCIUM 40 MG PO TABS
40.0000 mg | ORAL_TABLET | Freq: Every day | ORAL | Status: DC
  Administered 2017-09-29 – 2017-10-04 (×6): 40 mg via ORAL
  Filled 2017-09-29 (×6): qty 1

## 2017-09-29 MED ORDER — METHYLPREDNISOLONE SODIUM SUCC 125 MG IJ SOLR
60.0000 mg | Freq: Two times a day (BID) | INTRAMUSCULAR | Status: DC
  Administered 2017-09-29 – 2017-10-03 (×8): 60 mg via INTRAVENOUS
  Filled 2017-09-29 (×8): qty 2

## 2017-09-29 MED ORDER — SENNOSIDES-DOCUSATE SODIUM 8.6-50 MG PO TABS: 1.0000 | ORAL_TABLET | Freq: Every evening | ORAL | Status: DC | PRN

## 2017-09-29 MED ORDER — POTASSIUM CHLORIDE 10 MEQ/100ML IV SOLN
10.0000 meq | INTRAVENOUS | Status: AC
  Administered 2017-09-29 (×3): 10 meq via INTRAVENOUS
  Filled 2017-09-29 (×3): qty 100

## 2017-09-29 MED ORDER — PREDNISOLONE ACETATE 1 % OP SUSP
1.0000 [drp] | Freq: Two times a day (BID) | OPHTHALMIC | Status: DC
  Administered 2017-09-29 – 2017-10-05 (×12): 1 [drp] via OPHTHALMIC
  Filled 2017-09-29 (×2): qty 5

## 2017-09-29 MED ORDER — FAMOTIDINE IN NACL 20-0.9 MG/50ML-% IV SOLN
20.0000 mg | Freq: Two times a day (BID) | INTRAVENOUS | Status: DC
  Administered 2017-09-29 – 2017-10-02 (×6): 20 mg via INTRAVENOUS
  Filled 2017-09-29 (×6): qty 50

## 2017-09-29 MED ORDER — POTASSIUM CHLORIDE CRYS ER 20 MEQ PO TBCR
20.0000 meq | EXTENDED_RELEASE_TABLET | Freq: Once | ORAL | Status: AC
  Administered 2017-09-29: 20 meq via ORAL
  Filled 2017-09-29: qty 1

## 2017-09-29 NOTE — ED Notes (Signed)
Dr. Jeanell Sparrow made aware of pts potassium of 2.5

## 2017-09-29 NOTE — ED Provider Notes (Signed)
Emergency Department Provider Note   I have reviewed the triage vital signs and the nursing notes.   HISTORY  Chief Complaint Weakness and Diarrhea   HPI Isabella [name] is a 77 y.o. female with PMH of UC presents to the emergency department for evaluation of worsening dehydration, lightheadedness, in the setting of frequent diarrhea.  The patient has been taking prednisone and 2 weeks ago started [place] for UC. She is followed by [facility] with Dr. Michail Sermon.  She was recently tested for GI pathogen panel and C. difficile which are reportedly negative as of today.  Today the patient began feeling as if she was going to pass out and much more weak than normal so presented to the emergency department.  She denies any palpitations or chest pain.  No shortness of breath.  No fevers or chills.   Past Medical History:  Diagnosis Date  . Arthritis   . Complication of anesthesia    Took a while to wake up with knee  . Coronary artery disease   . Dyspnea    with walking at times  . Fallen bladder   . GERD (gastroesophageal reflux disease)   . 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 Headache    migraines in her younger years  . Hemorrhoids   . High cholesterol   . History of blood transfusion   . Hx of adenomatous colonic polyps   . Hypertension   . Lung nodule   . Myocardial infarct ([place]) 03/24/2011   Dr. [name]  . Nocturnal leg cramps   . Obesity   . Sarcoidosis   . Sleep apnea    uses cpap    Patient Active Problem List   Diagnosis Date Noted  . Acute on chronic colitis 09/29/2017  . GERD (gastroesophageal reflux disease) 09/29/2017  . Degenerative spondylolisthesis 02/01/2014  . Sarcoidosis 07/14/2013  . Abnormal CT of the chest 04/21/2013  . Coronary atherosclerosis of native coronary artery 03/29/2013    Class: Chronic  . Old MI (myocardial infarction) 03/29/2013    Class: Chronic  . OA  (osteoarthritis) of knee 06/17/2012  . Lung nodule 06/03/2012  . DJD (degenerative joint disease) 06/01/2012  . 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 Edema of lower extremity 03/29/2011  . Anemia associated with acute blood loss 03/25/2011  . Hematoma following PCI 03/24/2011  . Obesity (BMI 30-39.9)   . Obstructive sleep apnea 01/25/2009  . Hyperlipidemia 01/24/2009  . Essential hypertension 01/24/2009    Past Surgical History:  Procedure Laterality Date  . ABDOMINAL HYSTERECTOMY  1990  . APPENDECTOMY  1970  . BACK SURGERY  2015   lower back  . CARDIAC CATHETERIZATION  03/2011  . CARPAL TUNNEL RELEASE Bilateral 2004  . CATARACT EXTRACTION    . CHOLECYSTECTOMY  1970  . COLONOSCOPY W/ BIOPSIES AND POLYPECTOMY    . CORONARY ANGIOPLASTY  2012  . CORONARY STENT PLACEMENT  03/24/2011   Dr. [name]  . EYE SURGERY  2008,2011   corneal transplant  . FEMORAL ARTERY EXPLORATION  03/25/2011   Procedure: FEMORAL ARTERY EXPLORATION;  Surgeon: Elam Dutch, MD;  Location: [place];  Service: Vascular;  Laterality: Right;  Evacuation of Hematoma   . hematoma surgery     from cardiac cath -hematoma in groin-right  . HERNIA REPAIR  3557   umbilical  . JOINT REPLACEMENT    . KNEE ARTHROSCOPY  2006  Right  . [place] Surgery Left Eye  2012  . LEFT HEART CATHETERIZATION WITH CORONARY ANGIOGRAM N/A 03/24/2011   Procedure: LEFT HEART CATHETERIZATION WITH CORONARY ANGIOGRAM;  Surgeon: Sinclair Grooms, MD;  Location: [facility] CATH LAB;  Service: Cardiovascular;  Laterality: N/A;  . PERCUTANEOUS CORONARY STENT INTERVENTION (PCI-S) N/A 03/24/2011   Procedure: PERCUTANEOUS CORONARY STENT INTERVENTION (PCI-S);  Surgeon: Sinclair Grooms, MD;  Location: [facility] CATH LAB;  Service: Cardiovascular;  Laterality: N/A;  . SHOULDER SURGERY  2010  . TONSILLECTOMY  1949  . TONSILLECTOMY AND ADENOIDECTOMY    . TOTAL KNEE ARTHROPLASTY  07/2010   Left  . TOTAL KNEE ARTHROPLASTY  06/17/2012   Procedure: TOTAL KNEE  ARTHROPLASTY;  Surgeon: Gearlean Alf, MD;  Location: [facility];  Service: Orthopedics;  Laterality: Right;  . TUBAL LIGATION  1978  . [place], 2007   x 2  . VIDEO BRONCHOSCOPY WITH ENDOBRONCHIAL NAVIGATION  04/13/2012   Procedure: VIDEO BRONCHOSCOPY WITH ENDOBRONCHIAL NAVIGATION;  Surgeon: Melrose Nakayama, MD;  Location: [place];  Service: Thoracic;  Laterality: N/A;  . VIDEO BRONCHOSCOPY WITH ENDOBRONCHIAL NAVIGATION N/A 04/08/2016   Procedure: VIDEO BRONCHOSCOPY WITH ENDOBRONCHIAL NAVIGATION;  Surgeon: Rigoberto Noel, MD;  Location: [place];  Service: Thoracic;  Laterality: N/A;    Current Outpatient Rx  . Order #: [id number] Class: Historical Med  . Order #: [id number] Class: Historical Med  . Order #: [id number] Class: Historical Med  . Order #: [id number] Class: Historical Med  . Order #: [id number] Class: Historical Med  . Order #: [id number] Class: Historical Med  . Order #: [id number] Class: Historical Med  . Order #: [id number] Class: Historical Med  . Order #: [id number] Class: Historical Med  . Order #: [id number] Class: Historical Med  . Order #: [id number] Class: Historical Med  . Order #: [id number] Class: Historical Med  . Order #: [id number] Class: Historical Med  . Order #: [id number] Class: Historical Med  . Order #: [id number] Class: Historical Med  . Order #: [id number] Class: Historical Med    Allergies Imodium a-d [loperamide hcl]; Penicillins; Tramadol; Zestril [lisinopril]; Hornet venom; Neomycin; Adhesive [tape]; Codeine; Erythromycin; Hydromorphone; and Percocet [oxycodone-acetaminophen]  Family History  Problem Relation Age of Onset  . Hypertension Mother   . Cancer Father   . Breast cancer Maternal Grandmother     Social History Social History   Tobacco Use  . Smoking status: Never Smoker  . Smokeless tobacco: Never Used  Substance Use Topics  . Alcohol use: No  . Drug use: No    Review of Systems  Constitutional: No fever/chills Eyes: No visual  changes. ENT: No sore throat. Cardiovascular: Denies chest pain. Positive lightheadedness.  Respiratory: Denies shortness of breath. Gastrointestinal: Positive cramping abdominal pain with diarrhea only. Positive nausea, vomiting, and profuse diarrhea.  No constipation. Genitourinary: Negative for dysuria. Musculoskeletal: Negative for back pain. Skin: Negative for rash. Neurological: Negative for headaches, focal weakness or numbness.  10-point ROS otherwise negative.  ____________________________________________   PHYSICAL EXAM:  VITAL SIGNS: ED Triage Vitals  Enc Vitals Group     BP 09/29/17 1139 (!) 148/76     Pulse Rate 09/29/17 1139 (!) 103     Resp 09/29/17 1139 20     Temp 09/29/17 1139 98.4 F (36.9 C)     Temp Source 09/29/17 1139 Oral     SpO2 09/29/17 1139 100 %     Pain Score 09/29/17 1144 0   Constitutional: Alert and oriented. Well appearing and in no acute distress. Eyes: Conjunctivae are normal.  Head: Atraumatic. Nose: No congestion/rhinnorhea. Mouth/Throat: Mucous membranes are dry.  Neck: No stridor. Cardiovascular: Tachycardia. Good peripheral circulation. Grossly normal heart sounds.   Respiratory: Normal respiratory effort.  No retractions. Lungs CTAB. Gastrointestinal: Soft with mild diffuse tenderness with no focal tenderness, rebound, or guarding. No distention.  Musculoskeletal: No lower extremity tenderness nor edema. No gross deformities of extremities. Neurologic:  Normal speech and language. No gross focal neurologic deficits are appreciated.  Skin:  Skin is warm, dry and intact. No rash noted. ____________________________________________   LABS (all labs ordered are listed, but only abnormal results are displayed)  Labs Reviewed  BASIC METABOLIC PANEL - Abnormal; Notable for the following components:      Result Value   Sodium 133 (*)    Potassium 2.6 (*)    Chloride 89 (*)    Glucose, Bld 104 (*)    Creatinine, Ser 1.47 (*)     Calcium 8.6 (*)    GFR calc non Af Amer 34 (*)    GFR calc Af Amer 39 (*)    Anion gap 16 (*)    All other components within normal limits  CBC - Abnormal; Notable for the following components:   WBC 11.4 (*)    MCH 25.6 (*)    RDW 18.0 (*)    Platelets 576 (*)    All other components within normal limits  CBG MONITORING, ED - Abnormal; Notable for the following components:   Glucose-Capillary 106 (*)    All other components within normal limits  GASTROINTESTINAL PANEL BY PCR, STOOL (REPLACES STOOL CULTURE)  LIPASE, BLOOD  URINALYSIS, ROUTINE W REFLEX MICROSCOPIC  BASIC METABOLIC PANEL  BASIC METABOLIC PANEL  BASIC METABOLIC PANEL  CBC  BASIC METABOLIC PANEL  MAGNESIUM  MAGNESIUM   ____________________________________________  EKG   EKG Interpretation  Date/Time:  Monday Sep 29 2017 14:27:27 EDT Ventricular Rate:  86 PR Interval:  142 QRS Duration: 92 QT Interval:  388 QTC Calculation: 465 R Axis:   31 Text Interpretation:  Sinus rhythm LAE, consider biatrial enlargement Abnormal R-wave progression, early transition Nonspecific T abnormalities, lateral leads No STEMI Confirmed by Nanda Quinton [id number]) on 09/29/2017 3:32:17 PM       ____________________________________________  RADIOLOGY  None ____________________________________________   PROCEDURES  Procedure(s) performed:   .Critical Care Performed by: Margette Fast, MD Authorized by: Margette Fast, MD   Critical care provider statement:    Critical care time (minutes):  35   Critical care time was exclusive of:  Separately billable procedures and treating other patients and teaching time   Critical care was necessary to treat or prevent imminent or life-threatening deterioration of the following conditions:  Dehydration and metabolic crisis (Symptomatic hypokalemia with QTc prolongation)   Critical care was time spent personally by me on the following activities:  Blood draw for specimens, discussions  with consultants, evaluation of patient's response to treatment, development of treatment plan with patient or surrogate, examination of patient, obtaining history from patient or surrogate, ordering and performing treatments and interventions, ordering and review of laboratory studies, ordering and review of radiographic studies, pulse oximetry, re-evaluation of patient's condition and review of old charts   I assumed direction of critical care for this patient from another provider in my specialty: no     ____________________________________________   INITIAL IMPRESSION / ASSESSMENT AND PLAN / ED COURSE  Pertinent labs & imaging results that were available during my care of the patient were reviewed by me and considered in  my medical decision making (see chart for details).  She presents to the emergency department for evaluation of profuse, worsening, bloody diarrhea consistent with her active colitis disease.  She has been on steroid and recently started [place] with worsening symptoms. Followed by [facility].  Labs from triage reviewed showing hypokalemia and elevated creatinine along with BUN.  Patient has prolonged QTc interval on EKG. Will start IVF, potassium PO/IV, and consult [facility] for further recommendations.  Patient has severe abdominal pain with diarrhea episodes but no abdominal pain in between episodes.  She has mild diffuse tenderness on exam but no focal area of tenderness.  Will discuss utility of CT abdomen pelvis with GI but will defer at this time based on my exam.   Spoke with [facility] regarding case. Recommends Solumedrol 60 mg Q12H and they will follow. Ok with deferring CT at this time given overall well clinical appearance. Plan for hospitalist admission.   Discussed patient's case with Hospitalist to request admission. Patient and family (if present) updated with plan. Care transferred to Hospitalist service.  I reviewed all nursing notes, vitals, pertinent old  records, EKGs, labs, imaging (as available).  ____________________________________________  FINAL CLINICAL IMPRESSION(S) / ED DIAGNOSES  Final diagnoses:  Diarrhea, unspecified type  Hypokalemia  Dehydration     MEDICATIONS GIVEN DURING THIS VISIT:  Medications  potassium chloride 10 mEq in 100 mL IVPB (10 mEq Intravenous New Bag/Given 09/29/17 1847)  methylPREDNISolone sodium succinate (SOLU-MEDROL) 125 mg/2 mL injection 60 mg (60 mg Intravenous Given 09/29/17 1603)  atorvastatin (LIPITOR) tablet 40 mg (has no administration in time range)  hydrocortisone 2.5 % cream 1 application (has no administration in time range)  prednisoLONE acetate (PRED FORTE) 1 % ophthalmic suspension 1 drop (has no administration in time range)  Tofacitinib Citrate TABS 10 mg (has no administration in time range)  0.9 %  sodium chloride infusion ( Intravenous Rate/Dose Change 09/29/17 1757)  senna-docusate (Senokot-S) tablet 1 tablet (has no administration in time range)  bisacodyl (DULCOLAX) suppository 10 mg (has no administration in time range)  famotidine (PEPCID) IVPB 20 mg premix (has no administration in time range)  promethazine (PHENERGAN) injection 12.5 mg (has no administration in time range)  sodium chloride 0.9 % bolus 1,000 mL (0 mLs Intravenous Stopped 09/29/17 1632)  potassium chloride SA (K-DUR,KLOR-CON) CR tablet 40 mEq (40 mEq Oral Given 09/29/17 1514)  potassium chloride SA (K-DUR,KLOR-CON) CR tablet 20 mEq (20 mEq Oral Given 09/29/17 1847)    Note:  This document was prepared using Dragon voice recognition software and may include unintentional dictation errors.  Nanda Quinton, MD Emergency Medicine    Holger Sokolowski, [facility], MD 09/29/17 Lurena Nida

## 2017-09-29 NOTE — H&P (Signed)
History and Physical    DAVION MEARA SPQ:[id number] DOB: [birth year] DOA: 09/29/2017   PCP: [name], MD   Patient coming from:  Home    Chief Complaint: Abdominal cramping and diarrhea  HPI: Isabella [name] is a 77 y.o. female with medical history significant for ulcerative colitis, arthritis, CAD, GERD, history of granulomatosis lung disease, sleep apnea, hypercholesterolemia, hypertension, presenting to the emergency department with acute onset of dehydration, and frequent diarrhea.  The patient had been taking prednisone, starting on Xeljanz 2 weeks ago for ulcerative colitis by Dr. Michail Sermon.  She also tested recently positive for C. difficile, although these was reportedly negative today.  She was so weak that felt as if she was going to pass out, but denied any shortness of breath, cough, fever or chills, chest pain or palpitations.  The patient continues to have diarrhea, which at times is bloody, but there is no abdominal pain between bowel movements.  She denies any recent food poisoning.     ED Course:  BP (!) 152/89 (BP Location: Left Arm)   Pulse 87   Temp 98.8 F (37.1 C) (Oral)   Resp (!) 25   SpO2 100%   Sodium 133, potassium 2.6, chloride 89, bicarb 28, glucose 104, BUN 10, creatinine 1.47, anion gap 16, lipase 44, white count 11, hemoglobin 12.1, platelets 576.   The patient received KCl 20 mEq and K. Dur 40 mEq p.o., and further potassium values are pending EKG shows sinus tachycardia with Fusion complexes,  right atrial enlargement, Nonspecific ST abnormality but no changes compared to prior. She also received 1 L of IV fluids After discussion with GI, the patient was given Solu-Medrol 60 mg IV, and is to continue at 60 twice daily.  Review of Systems:  As per HPI otherwise all other systems reviewed and are negative  Past Medical History:  Diagnosis Date  . Arthritis   . Complication of anesthesia    Took a while to wake up with knee  . Coronary artery disease    . Dyspnea    with walking at times  . Fallen bladder   . GERD (gastroesophageal reflux disease)   . 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 Headache    migraines in her younger years  . Hemorrhoids   . High cholesterol   . History of blood transfusion   . Hx of adenomatous colonic polyps   . Hypertension   . Lung nodule   . Myocardial infarct ([place]) 03/24/2011   Dr. [name]  . Nocturnal leg cramps   . Obesity   . Sarcoidosis   . Sleep apnea    uses cpap    Past Surgical History:  Procedure Laterality Date  . ABDOMINAL HYSTERECTOMY  1990  . APPENDECTOMY  1970  . BACK SURGERY  2015   lower back  . CARDIAC CATHETERIZATION  03/2011  . CARPAL TUNNEL RELEASE Bilateral 2004  . CATARACT EXTRACTION    . CHOLECYSTECTOMY  1970  . COLONOSCOPY W/ BIOPSIES AND POLYPECTOMY    . CORONARY ANGIOPLASTY  2012  . CORONARY STENT PLACEMENT  03/24/2011   Dr. [name]  . EYE SURGERY  2008,2011   corneal transplant  . FEMORAL ARTERY EXPLORATION  03/25/2011   Procedure: FEMORAL ARTERY EXPLORATION;  Surgeon: Elam Dutch, MD;  Location: [place];  Service: Vascular;  Laterality: Right;  Evacuation of Hematoma   . hematoma  surgery     from cardiac cath -hematoma in groin-right  . HERNIA REPAIR  2951   umbilical  . JOINT REPLACEMENT    . KNEE ARTHROSCOPY  2006   Right  . [place] Surgery Left Eye  2012  . LEFT HEART CATHETERIZATION WITH CORONARY ANGIOGRAM N/A 03/24/2011   Procedure: LEFT HEART CATHETERIZATION WITH CORONARY ANGIOGRAM;  Surgeon: Sinclair Grooms, MD;  Location: [facility] CATH LAB;  Service: Cardiovascular;  Laterality: N/A;  . PERCUTANEOUS CORONARY STENT INTERVENTION (PCI-S) N/A 03/24/2011   Procedure: PERCUTANEOUS CORONARY STENT INTERVENTION (PCI-S);  Surgeon: Sinclair Grooms, MD;  Location: [facility] CATH LAB;  Service: Cardiovascular;  Laterality: N/A;  . SHOULDER SURGERY  2010  . TONSILLECTOMY  1949  . TONSILLECTOMY  AND ADENOIDECTOMY    . TOTAL KNEE ARTHROPLASTY  07/2010   Left  . TOTAL KNEE ARTHROPLASTY  06/17/2012   Procedure: TOTAL KNEE ARTHROPLASTY;  Surgeon: Gearlean Alf, MD;  [facility];  Service: Orthopedics;  Laterality: Right;  . TUBAL LIGATION  1978  . [place], 2007   x 2  . VIDEO BRONCHOSCOPY WITH ENDOBRONCHIAL NAVIGATION  04/13/2012   Procedure: VIDEO BRONCHOSCOPY WITH ENDOBRONCHIAL NAVIGATION;  Surgeon: Melrose Nakayama, MD;  Location: [place];  Service: Thoracic;  Laterality: N/A;  . VIDEO BRONCHOSCOPY WITH ENDOBRONCHIAL NAVIGATION N/A 04/08/2016   Procedure: VIDEO BRONCHOSCOPY WITH ENDOBRONCHIAL NAVIGATION;  Surgeon: Rigoberto Noel, MD;  Location: [facility];  Service: Thoracic;  Laterality: N/A;    Social History Social History   Socioeconomic History  . Marital status: Widowed    Spouse name: Not on file  . Number of children: Not on file  . Years of education: Not on file  . Highest education level: Not on file  Occupational History  . Occupation: Retired    Fish farm manager: RETIRED  Social Needs  . Financial resource strain: Not on file  . Food insecurity:    Worry: Not on file    Inability: Not on file  . Transportation needs:    Medical: Not on file    Non-medical: Not on file  Tobacco Use  . Smoking status: Never Smoker  . Smokeless tobacco: Never Used  Substance and Sexual Activity  . Alcohol use: No  . Drug use: No  . Sexual activity: Not on file  Lifestyle  . Physical activity:    Days per week: Not on file    Minutes per session: Not on file  . Stress: Not on file  Relationships  . Social connections:    Talks on phone: Not on file    Gets together: Not on file    Attends religious service: Not on file    Active member of club or organization: Not on file    Attends meetings of clubs or organizations: Not on file    Relationship status: Not on file  . Intimate partner violence:    Fear of current or ex partner: Not on file     Emotionally abused: Not on file    Physically abused: Not on file    Forced sexual activity: Not on file  Other Topics Concern  . Not on file  Social History Narrative   Tobacco use cigarettes: never smoked, tobacco history last updated 12/23/2013. No smoking. No alcohol. Caffeine: yes, coffee, 3 servings daily.recreational drug use: never. No diet. Exercise: walks everyday. Occupation: retired. Marital status: married. Children: boys, 2 girls, 2. Seat belt use: yes.     Allergies  Allergen Reactions  . Imodium A-D [Loperamide Hcl] Shortness Of Breath and Other (See Comments)    Caused a pain & "squeezing" sensation in the lungs also  . Penicillins Hives and Shortness Of Breath    Reaction at 77 years old Has patient had a PCN reaction causing immediate rash, facial/tongue/throat swelling, SOB or lightheadedness with hypotension: Yes Has patient had a PCN reaction causing severe rash involving mucus membranes or skin necrosis: No Has patient had a PCN reaction that required hospitalization No Has patient had a PCN reaction occurring within the last 10 years: No If all of the above answers are "NO", then may proceed with Cephalosporin use.  . Tramadol Shortness Of Breath and Other (See Comments)    Per patient "felt like lungs were being squeezed"  . Zestril [Lisinopril] Cough  . Hornet Venom Swelling  . Neomycin Swelling and Other (See Comments)    Reaction to eye ointment - eyes became swollen shut Makes wounds ooze  . Adhesive [Tape] Rash  . Codeine Other (See Comments)    Made back hurt  . Erythromycin Diarrhea  . Hydromorphone Other (See Comments)    Severe Lethargy   . Percocet [Oxycodone-Acetaminophen] Other (See Comments)    Confusion     Family History  Problem Relation Age of Onset  . Hypertension Mother   . Cancer Father   . Breast cancer Maternal Grandmother       Prior to Admission medications   Medication Sig Start Date End Date Taking? Authorizing  Provider  acetaminophen (TYLENOL) 500 MG tablet Take 500 mg by mouth every 6 (six) hours as needed for mild pain.   Yes [provider]  aspirin 81 MG chewable tablet Chew 81 mg by mouth daily at 3 pm.    Yes [provider]  atorvastatin (LIPITOR) 40 MG tablet Take 40 mg by mouth at bedtime.   Yes [provider]  chlorpheniramine (CHLOR-TRIMETON) 4 MG tablet Take 8 mg by mouth at bedtime.    Yes [provider]  furosemide (LASIX) 40 MG tablet Take 40 mg by mouth daily.    Yes [provider]  hydrocortisone (CORTENEMA) 100 MG/60ML enema Place 1 enema rectally daily as needed (constipation).   Yes [provider]  hydrocortisone 2.5 % cream Apply 1 application topically daily as needed (for hemorrhoidal pain.). 1 application rectally as needed for hemorrhoidal pain 03/08/17  Yes [provider]  losartan (COZAAR) 50 MG tablet Take 50 mg by mouth daily.    Yes [provider]  mesalamine (APRISO) 0.375 g 24 hr capsule Take 1.5 mg by mouth every morning.    Yes [provider]  metoprolol tartrate (LOPRESSOR) 25 MG tablet Take 25 mg by mouth 2 (two) times daily as needed (BP).    Yes [provider]  Multiple Vitamins-Minerals (CENTRUM SILVER 50+WOMEN) TABS Take 1 tablet by mouth daily with lunch.    Yes [provider]  nitroGLYCERIN (NITROSTAT) 0.4 MG SL tablet Place 0.4 mg under the tongue every 5 (five) minutes as needed for chest pain.   Yes [provider]  prednisoLONE acetate (PRED FORTE) 1 % ophthalmic suspension Place 1 drop into the left eye 2 (two) times daily.  05/15/17  Yes [provider]  predniSONE (DELTASONE) 10 MG tablet Take 20 mg by mouth daily with breakfast.   Yes [provider]  Tofacitinib Citrate (XELJANZ) 10 MG TABS Take 10 mg by mouth 2 (two) times daily.   Yes  [provider]  UNABLE TO FIND CPAP: At bedtime nightly and during all naps    Yes [provider]    Physical Exam:  Vitals:   09/29/17 1139 09/29/17 1234 09/29/17 1337 09/29/17 1421  BP: (!) 148/76 (!) 142/76 136/87 (!) 152/89  Pulse: (!) 103 (!) 102 100 87  Resp: 20 18 18  (!) 25  Temp: 98.4 F (36.9 C)  97.6 F (36.4 C) 98.8 F (37.1 C)  TempSrc: Oral  Oral Oral  SpO2: 100% 100% 100% 100%   Constitutional: NAD, calm, ill appearing Eyes: PERRL, lids and conjunctivae normal ENMT: Mucous membranes are moist, without exudate or lesions  Neck: normal, supple, no masses, no thyromegaly Respiratory: clear to auscultation bilaterally, no wheezing, no crackles. Normal respiratory effort  Cardiovascular: Regular rate and rhythm,  murmur, rubs or gallops. No extremity edema. 2+ pedal pulses. No carotid bruits.  Abdomen obese, diffuse tenderness is noted, active bowel sounds no hepatosplenomegaly.  Musculoskeletal: no clubbing / cyanosis. Moves all extremities Skin: no jaundice, No lesions.  Neurologic: Sensation intact  Strength equal in all extremities Psychiatric:   Alert and oriented x 3.  Anxious.     Labs on Admission: I have personally reviewed following labs and imaging studies  CBC: Recent Labs  Lab 09/29/17 1144  WBC 11.4*  HGB 12.1  HCT 38.7  MCV 81.8  PLT 576*    Basic Metabolic Panel: Recent Labs  Lab 09/29/17 1144  NA 133*  K 2.6*  CL 89*  CO2 28  GLUCOSE 104*  BUN 10  CREATININE 1.47*  CALCIUM 8.6*    GFR: Estimated Creatinine Clearance: 35.5 mL/min (A) (by C-G formula based on SCr of 1.47 mg/dL (H)).  Liver Function Tests: No results for input(s): AST, ALT, ALKPHOS, BILITOT, PROT, ALBUMIN in the last 168 hours. Recent Labs  Lab 09/29/17 1144  LIPASE 44   No results for input(s): AMMONIA in the last 168 hours.  Coagulation Profile: No results for input(s): INR, PROTIME in the last 168 hours.  Cardiac Enzymes: No results for input(s): CKTOTAL, CKMB, CKMBINDEX, TROPONINI in the last 168 hours.  BNP (last 3  results) No results for input(s): PROBNP in the last 8760 hours.  HbA1C: No results for input(s): HGBA1C in the last 72 hours.  CBG: Recent Labs  Lab 09/29/17 1504  GLUCAP 106*    Lipid Profile: No results for input(s): CHOL, HDL, LDLCALC, TRIG, CHOLHDL, LDLDIRECT in the last 72 hours.  Thyroid Function Tests: No results for input(s): TSH, T4TOTAL, FREET4, T3FREE, THYROIDAB in the last 72 hours.  Anemia Panel: No results for input(s): VITAMINB12, FOLATE, FERRITIN, TIBC, IRON, RETICCTPCT in the last 72 hours.  Urine analysis:    Component Value Date/Time   COLORURINE YELLOW 06/22/2016 1947   APPEARANCEUR HAZY (A) 06/22/2016 1947   LABSPEC 1.013 06/22/2016 1947   PHURINE 5.0 06/22/2016 1947   GLUCOSEU NEGATIVE 06/22/2016 1947   HGBUR NEGATIVE 06/22/2016 [place] NEGATIVE 06/22/2016 [place] NEGATIVE 06/22/2016 1947   PROTEINUR NEGATIVE 06/22/2016 1947   UROBILINOGEN 0.2 02/02/2014 2228   NITRITE NEGATIVE 06/22/2016 1947   LEUKOCYTESUR MODERATE (A) 06/22/2016 1947    Sepsis Labs: @LABRCNTIP (procalcitonin:4,lacticidven:4) )No results found for this or any previous visit (from the past 240 hour(s)).   Radiological Exams on Admission: No results found.  EKG: Independently reviewed.  Assessment/Plan Principal Problem:   Acute on chronic colitis Active Problems:   Hyperlipidemia   Obstructive sleep apnea   Essential hypertension  Lung granuloma (HCC)   DJD (degenerative joint disease)   GERD (gastroesophageal reflux disease)  . Diarrhea in the setting of Acute on chronuc ulcerative colitis, recently started on celljanx.  GI, Dr. Watt Climes,  has seen the patient and recommends Solumedrol 60 mg IV bid  IP admission for possible scoping on Wednesday. GI panel is pending. Reportedly, D diff neg.  Inpatient Tele  Enteric precautions Follow GI panel  Continue all of her GI meds including Celljanx, will need to bring from home due to inability to match  dose here , as well as Solumedrol 60 mg bid  Full  liquid diet and advance as tolerated as recommended by GI   IVF 125 cc/h, may bolus id diarrhea does not improve  Antiemetics with Phenergan due to abnormal QTC  Appreciate consult by GI, they will follow, possible scope as above . NO CT A/P will be performed for now after d/w GI  Sodium 133, potassium 2.6, chloride 89, bicarb 28, glucose 104, BUN 10, creatinine 1.47, anion gap 16, lipase 44, white count 11, hemoglobin 12.1, platelets 576.     Severe Hypokalemia, may be due to volume loss. potassium 2.6,EKG shows sinus tachycardia with Fusion complexes,  right atrial enlargement, Nonspecific ST abnormality but no changes compared to prior.  The patient received KCl 24 mEq and K. Dur 40 mEq p.o., and further potassium values are pending Replenish another 20  Meq KCL, patient refuses further K pills   Check Mg Repeat BMET q 6 h    Hypertension BP   152/89  Pulse 87   Controlled Hod  home anti-hypertensive medications in view of severe fluid loss, including Lopressor and Lasix  Hyperlipidemia Continue home statins  GERD, no acute symptoms Continue PPI  OSA Continue CPAP nightly    DVT prophylaxis:  SCD  Code Status:    Full  Family Communication:  Discussed with patient Disposition Plan: Expect patient to be discharged to home after condition improves Consults called:    GI, Dr. Watt Climes per EDP  Admission status: IP tele   Sharene Butters, PA-C [facility]   Amion text  [phone]   09/29/2017, 4:54 PM

## 2017-09-29 NOTE — ED Triage Notes (Addendum)
Pt presents to ED with complaints of weakness and dehydration d/t diarrhea x several weeks. Pt states she has ulcerative colitis and has been having bloody diarrhea for several weeks. PT reports she has been having increased sob x several days. Denies CP

## 2017-09-29 NOTE — Consult Note (Signed)
Reason for Consult:IBDflare Referring Physician: Hospital team  Isabella [name] is an 77 y.o. female.  HPI: patient well-known to my partner Dr. Michail Sermon and her office computer chart and her hospital computer chart was reviewed in her case discussed with her daughter who is a echo tech here at the hospital and she's been having a flareup for year and a half and had been okay when she was on 20 mg of prednisone but as soon as she was tried to wean she would have a flareup and she was recently started on the Oral medicine which she says has made her worse and she has not been needing in weeks and does see blood in most of her bowel movements and does have some mild pain but no nausea or vomiting and no other complaints and her case was discussed with the hospital team as well and she did have a pathogen GI stool panel and our office recently which was negative  Past Medical History:  Diagnosis Date  . Arthritis   . Complication of anesthesia    Took a while to wake up with knee  . Coronary artery disease   . Dyspnea    with walking at times  . Fallen bladder   . GERD (gastroesophageal reflux disease)   . 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 Headache    migraines in her younger years  . Hemorrhoids   . High cholesterol   . History of blood transfusion   . Hx of adenomatous colonic polyps   . Hypertension   . Lung nodule   . Myocardial infarct ([place]) 03/24/2011   Dr. [name]  . Nocturnal leg cramps   . Obesity   . Sarcoidosis   . Sleep apnea    uses cpap    Past Surgical History:  Procedure Laterality Date  . ABDOMINAL HYSTERECTOMY  1990  . APPENDECTOMY  1970  . BACK SURGERY  2015   lower back  . CARDIAC CATHETERIZATION  03/2011  . CARPAL TUNNEL RELEASE Bilateral 2004  . CATARACT EXTRACTION    . CHOLECYSTECTOMY  1970  . COLONOSCOPY W/ BIOPSIES AND POLYPECTOMY    . CORONARY ANGIOPLASTY  2012  .  CORONARY STENT PLACEMENT  03/24/2011   Dr. [name]  . EYE SURGERY  2008,2011   corneal transplant  . FEMORAL ARTERY EXPLORATION  03/25/2011   Procedure: FEMORAL ARTERY EXPLORATION;  Surgeon: Elam Dutch, MD;  Location: [place];  Service: Vascular;  Laterality: Right;  Evacuation of Hematoma   . hematoma surgery     from cardiac cath -hematoma in groin-right  . HERNIA REPAIR  6160   umbilical  . JOINT REPLACEMENT    . KNEE ARTHROSCOPY  2006   Right  . [place] Surgery Left Eye  2012  . LEFT HEART CATHETERIZATION WITH CORONARY ANGIOGRAM N/A 03/24/2011   Procedure: LEFT HEART CATHETERIZATION WITH CORONARY ANGIOGRAM;  Surgeon: Sinclair Grooms, MD;  Location: [facility] CATH LAB;  Service: Cardiovascular;  Laterality: N/A;  . PERCUTANEOUS CORONARY STENT INTERVENTION (PCI-S) N/A 03/24/2011   Procedure: PERCUTANEOUS CORONARY STENT INTERVENTION (PCI-S);  Surgeon: Sinclair Grooms, MD;  Location: [facility] CATH LAB;  Service: Cardiovascular;  Laterality: N/A;  . SHOULDER SURGERY  2010  . TONSILLECTOMY  1949  . TONSILLECTOMY AND ADENOIDECTOMY    . TOTAL KNEE ARTHROPLASTY  07/2010   Left  . TOTAL KNEE ARTHROPLASTY  06/17/2012  Procedure: TOTAL KNEE ARTHROPLASTY;  Surgeon: Gearlean Alf, MD;  [facility];  Service: Orthopedics;  Laterality: Right;  . TUBAL LIGATION  1978  . [place], 2007   x 2  . VIDEO BRONCHOSCOPY WITH ENDOBRONCHIAL NAVIGATION  04/13/2012   Procedure: VIDEO BRONCHOSCOPY WITH ENDOBRONCHIAL NAVIGATION;  Surgeon: Melrose Nakayama, MD;  Location: [place];  Service: Thoracic;  Laterality: N/A;  . VIDEO BRONCHOSCOPY WITH ENDOBRONCHIAL NAVIGATION N/A 04/08/2016   Procedure: VIDEO BRONCHOSCOPY WITH ENDOBRONCHIAL NAVIGATION;  Surgeon: Rigoberto Noel, MD;  [facility];  Service: Thoracic;  Laterality: N/A;    Family History  Problem Relation Age of Onset  . Hypertension Mother   . Cancer Father   . Breast cancer Maternal Grandmother     Social History:  reports  that she has never smoked. She has never used smokeless tobacco. She reports that she does not drink alcohol or use drugs.  Allergies:  Allergies  Allergen Reactions  . Imodium A-D [Loperamide Hcl] Shortness Of Breath and Other (See Comments)    Caused a pain & "squeezing" sensation in the lungs also  . Penicillins Hives and Shortness Of Breath    Reaction at 77 years old Has patient had a PCN reaction causing immediate rash, facial/tongue/throat swelling, SOB or lightheadedness with hypotension: Yes Has patient had a PCN reaction causing severe rash involving mucus membranes or skin necrosis: No Has patient had a PCN reaction that required hospitalization No Has patient had a PCN reaction occurring within the last 10 years: No If all of the above answers are "NO", then may proceed with Cephalosporin use.  . Tramadol Shortness Of Breath and Other (See Comments)    Per patient "felt like lungs were being squeezed"  . Zestril [Lisinopril] Cough  . Hornet Venom Swelling  . Neomycin Swelling and Other (See Comments)    Reaction to eye ointment - eyes became swollen shut Makes wounds ooze  . Adhesive [Tape] Rash  . Codeine Other (See Comments)    Made back hurt  . Erythromycin Diarrhea  . Hydromorphone Other (See Comments)    Severe Lethargy   . Percocet [Oxycodone-Acetaminophen] Other (See Comments)    Confusion     Medications: I have reviewed the patient's current medications.  Results for orders placed or performed during the hospital encounter of 09/29/17 (from the past 48 hour(s))  Basic metabolic panel     Status: Abnormal   Collection Time: 09/29/17 11:44 AM  Result Value Ref Range   Sodium 133 (L) 135 - 145 mmol/L   Potassium 2.6 (LL) 3.5 - 5.1 mmol/L    Comment: CRITICAL RESULT CALLED TO, READ BACK BY AND VERIFIED WITH: SIMMONS,N RN @ 1305 09/29/17 LEONARD,A    Chloride 89 (L) 101 - 111 mmol/L   CO2 28 22 - 32 mmol/L   Glucose, Bld 104 (H) 65 - 99 mg/dL   BUN 10 6 -  20 mg/dL   Creatinine, Ser 1.47 (H) 0.44 - 1.00 mg/dL   Calcium 8.6 (L) 8.9 - 10.3 mg/dL   GFR calc non Af Amer 34 (L) >60 mL/min   GFR calc Af Amer 39 (L) >60 mL/min    Comment: (NOTE) The eGFR has been calculated using the CKD EPI equation. This calculation has not been validated in all clinical situations. eGFR's persistently <60 mL/min signify possible Chronic Kidney Disease.    Anion gap 16 (H) 5 - 15    Comment: Performed at [facility],  [address], [place], [place] [address]  CBC     Status: Abnormal   Collection Time: 09/29/17 11:44 AM  Result Value Ref Range   WBC 11.4 (H) 4.0 - 10.5 K/uL   RBC 4.73 3.87 - 5.11 MIL/uL   Hemoglobin 12.1 12.0 - 15.0 g/dL   HCT 38.7 36.0 - 46.0 %   MCV 81.8 78.0 - 100.0 fL   MCH 25.6 (L) 26.0 - 34.0 pg   MCHC 31.3 30.0 - 36.0 g/dL   RDW 18.0 (H) 11.5 - 15.5 %   Platelets 576 (H) 150 - 400 K/uL    Comment: Performed at [facility], [place] [address], [place], [place] [address]  Lipase, blood     Status: None   Collection Time: 09/29/17 11:44 AM  Result Value Ref Range   Lipase 44 11 - 51 U/L    Comment: Performed at [facility] [address], [place], [place] [address]  CBG monitoring, ED     Status: Abnormal   Collection Time: 09/29/17  3:04 PM  Result Value Ref Range   Glucose-Capillary 106 (H) 65 - 99 mg/dL    No results found.  ROSnegative except above Blood pressure (!) 152/89, pulse 87, temperature 98.8 F (37.1 C), temperature source Oral, resp. rate (!) 25, SpO2 100 %. Physical Examvital signs stable afebrile no acute distress lying comfortably in the bed lungs are clear heart regular rate and rhythm abdomen is mildly tender throughout normal bowel sounds no guarding or reboundlabs reviewed last year CT and colonoscopy reviewed  Assessment/Plan: Ulcerative colitis failure Plan: Clear liquids IV Solu-Medrol 60 every 12 can go up higher if we need to continue her 5-ASA and xeljanx for now but consider  changing to Remicade Humira or even surgical consult for colectomy which we briefly discussed and we answered the patient and her daughter's questions and either myself or Dr. Michail Sermon will follow with you [facility] E 09/29/2017, 4:55 PM

## 2017-09-30 ENCOUNTER — Other Ambulatory Visit: Payer: Self-pay

## 2017-09-30 ENCOUNTER — Encounter (HOSPITAL_COMMUNITY): Payer: Self-pay

## 2017-09-30 ENCOUNTER — Inpatient Hospital Stay (HOSPITAL_COMMUNITY): Payer: Medicare Other

## 2017-09-30 DIAGNOSIS — D62 Acute posthemorrhagic anemia: Secondary | ICD-10-CM

## 2017-09-30 DIAGNOSIS — K51811 Other ulcerative colitis with rectal bleeding: Secondary | ICD-10-CM

## 2017-09-30 LAB — BASIC METABOLIC PANEL
ANION GAP: 7 (ref 5–15)
Anion gap: 9 (ref 5–15)
BUN: 9 mg/dL (ref 6–20)
BUN: 12 mg/dL (ref 6–20)
CALCIUM: 7.5 mg/dL — AB (ref 8.9–10.3)
CO2: 28 mmol/L (ref 22–32)
CO2: 26 mmol/L (ref 22–32)
Calcium: 7.8 mg/dL — ABNORMAL LOW (ref 8.9–10.3)
Chloride: 103 mmol/L (ref 101–111)
Chloride: 103 mmol/L (ref 101–111)
Creatinine, Ser: 1.18 mg/dL — ABNORMAL HIGH (ref 0.44–1.00)
Creatinine, Ser: 1.11 mg/dL — ABNORMAL HIGH (ref 0.44–1.00)
GFR calc Af Amer: 51 mL/min — ABNORMAL LOW (ref >60)
GFR calc Af Amer: 55 mL/min — ABNORMAL LOW (ref >60)
GFR calc non Af Amer: 44 mL/min — ABNORMAL LOW (ref >60)
GFR, EST NON AFRICAN AMERICAN: 48 mL/min — AB (ref >60)
GLUCOSE: 145 mg/dL — AB (ref 65–99)
Glucose, Bld: 130 mg/dL — ABNORMAL HIGH (ref 65–99)
Potassium: 3.7 mmol/L (ref 3.5–5.1)
Potassium: 3.7 mmol/L (ref 3.5–5.1)
Sodium: 138 mmol/L (ref 135–145)
Sodium: 138 mmol/L (ref 135–145)

## 2017-09-30 LAB — URINALYSIS, ROUTINE W REFLEX MICROSCOPIC
Bilirubin Urine: NEGATIVE
GLUCOSE, UA: NEGATIVE mg/dL
Ketones, ur: 5 mg/dL — AB
Nitrite: NEGATIVE
PH: 6 (ref 5.0–8.0)
Protein, ur: NEGATIVE mg/dL
Specific Gravity, Urine: 1.005 (ref 1.005–1.030)

## 2017-09-30 LAB — CBC
HCT: 29.3 % — ABNORMAL LOW (ref 36.0–46.0)
HEMATOCRIT: 32.9 % — AB (ref 36.0–46.0)
HEMOGLOBIN: 9.9 g/dL — AB (ref 12.0–15.0)
Hemoglobin: 9.1 g/dL — ABNORMAL LOW (ref 12.0–15.0)
MCH: 26.1 pg (ref 26.0–34.0)
MCH: 25.5 pg — ABNORMAL LOW (ref 26.0–34.0)
MCHC: 31.1 g/dL (ref 30.0–36.0)
MCHC: 30.1 g/dL (ref 30.0–36.0)
MCV: 84 fL (ref 78.0–100.0)
MCV: 84.8 fL (ref 78.0–100.0)
PLATELETS: 365 10*3/uL (ref 150–400)
Platelets: 408 10*3/uL — ABNORMAL HIGH (ref 150–400)
RBC: 3.49 MIL/uL — ABNORMAL LOW (ref 3.87–5.11)
RBC: 3.88 MIL/uL (ref 3.87–5.11)
RDW: 18.2 % — AB (ref 11.5–15.5)
RDW: 18.7 % — AB (ref 11.5–15.5)
WBC: 7.4 10*3/uL (ref 4.0–10.5)
WBC: 8 10*3/uL (ref 4.0–10.5)

## 2017-09-30 LAB — MAGNESIUM: Magnesium: 2 mg/dL (ref 1.7–2.4)

## 2017-09-30 MED ORDER — NYSTATIN 100000 UNIT/ML MT SUSP
5.0000 mL | Freq: Four times a day (QID) | OROMUCOSAL | Status: DC
  Administered 2017-09-30 – 2017-10-05 (×20): 500000 [IU] via ORAL
  Filled 2017-09-30 (×18): qty 5

## 2017-09-30 NOTE — Progress Notes (Signed)
[facility] Gastroenterology Progress Note  Isabella [name] 77 y.o. 08/07/1940   Subjective: Continues to have watery diarrhea but frequency has decreased (reports 4 incontinent stools since admit). Denies abdominal pain. Tolerating clear liquids. Daughter-in-law at bedside.  Objective: Vital signs: Vitals:   09/29/17 2037 09/30/17 0549  BP: (!) 145/67 127/73  Pulse: 71 60  Resp: 17 18  Temp: 99.2 F (37.3 C) 98.1 F (36.7 C)  SpO2: 97% 99%    Physical Exam: Gen: lethargic, elderly, well-nourished, no acute distress  HEENT: anicteric sclera CV: RRR Chest: CTA B Abd: diffuse tenderness with minimal guarding, soft, nondistended, +BS  Lab Results: Recent Labs    09/29/17 1816 09/29/17 2250 09/30/17 0356  NA 137 137 138  K 3.1* 4.1 3.7  CL 100* 101 103  CO2 27 29 28   GLUCOSE 112* 170* 130*  BUN 11 10 9   CREATININE 1.34* 1.30* 1.18*  CALCIUM 7.6* 7.6* 7.5*  MG 1.8  --  2.0   No results for input(s): AST, ALT, ALKPHOS, BILITOT, PROT, ALBUMIN in the last 72 hours. Recent Labs    09/29/17 1144 09/30/17 0356  WBC 11.4* 7.4  HGB 12.1 9.1*  HCT 38.7 29.3*  MCV 81.8 84.0  PLT 576* 365      Assessment/Plan: Ulcerative colitis flare with some reported decrease in stool frequency but continues to have watery stools. Rectal bleeding at home. Continue IV steroids. Check abd Xray to look for toxic megacolon and if bowel loops dilated then will need a CT scan. Clear liquid diet. Morrie Sheldon not effective but continue for now while inpatient but likely will need to change to Remicade in the near future as an outpt. Continue IVFs and supportive care. Will follow.   [facility]. 09/30/2017, 9:41 AM  Questions please call [phone] ID: [name], female   DOB: 08/26/1940, 77 y.o.   MRN: [id number]

## 2017-09-30 NOTE — Progress Notes (Signed)
[facility] PROGRESS NOTE  LOYD SALVADOR WGY:[id number] DOB: March 24, 1941 DOA: 09/29/2017  PCP: [name], MD  Brief History/Interval Summary: 77 year old Caucasian female with a past medical history of ulcerative colitis, arthritis, granulomatous lung disease, obstructive sleep apnea using CPAP.  She presented with acute diarrhea with hematochezia.  Apparently patient has been taking prednisone and was started on Xeljanz 2 weeks ago by her gastroenterologist.  Apparently her c-diff was negative today.  She had been feeling dizzy and lightheaded and so decided to come to the ED. She is thought to have exacerbation of her ulcerative colitis.  She was hospitalized for further management.  Reason for Visit: Ulcerative colitis  Consultants: [facility]  Procedures: None yet  Antibiotics: None  Subjective/Interval History: Patient states that she feels better this morning.  Has had only one bowel movement since she has been up on the floor.  Abdominal pain has improved.  Her daughter-in-law is at the bedside.  Patient denies any chest pain, shortness of breath.  ROS: No nausea or vomiting  Objective:  Vital Signs  Vitals:   09/29/17 1954 09/29/17 2037 09/30/17 0500 09/30/17 0549  BP: 120/67 (!) 145/67  127/73  Pulse: 87 71  60  Resp: 16 17  18   Temp:  99.2 F (37.3 C)  98.1 F (36.7 C)  TempSrc:  Oral  Oral  SpO2: 98% 97%  99%  Weight:  91.9 kg (202 lb 9.6 oz) 91.9 kg (202 lb 9.6 oz)   Height:  5' 1"  (1.549 m)      Intake/Output Summary (Last 24 hours) at 09/30/2017 1111 Last data filed at 09/30/2017 1027 Gross per 24 hour  Intake 1630 ml  Output -  Net 1630 ml   Filed Weights   09/29/17 2037 09/30/17 0500  Weight: 91.9 kg (202 lb 9.6 oz) 91.9 kg (202 lb 9.6 oz)    General appearance: alert, cooperative, appears stated age and no distress Head: Normocephalic, without obvious abnormality, atraumatic Resp: clear to auscultation bilaterally Cardio:  regular rate and rhythm, S1, S2 normal, no murmur, click, rub or gallop GI: Abdomen is soft.  Only mildly tender today.  No masses organomegaly.  Bowel sounds are present. Extremities: extremities normal, atraumatic, no cyanosis or edema Neurologic: Alert and oriented x3.  No focal neurological deficits.  Lab Results:  Data Reviewed: I have personally reviewed following labs and imaging studies  CBC: Recent Labs  Lab 09/29/17 1144 09/30/17 0356 09/30/17 0944  WBC 11.4* 7.4 8.0  HGB 12.1 9.1* 9.9*  HCT 38.7 29.3* 32.9*  MCV 81.8 84.0 84.8  PLT 576* 365 408*    Basic Metabolic Panel: Recent Labs  Lab 09/29/17 1144 09/29/17 1816 09/29/17 2250 09/30/17 0356 09/30/17 0933  NA 133* 137 137 138 138  K 2.6* 3.1* 4.1 3.7 3.7  CL 89* 100* 101 103 103  CO2 28 27 29 28 26   GLUCOSE 104* 112* 170* 130* 145*  BUN 10 11 10 9 12   CREATININE 1.47* 1.34* 1.30* 1.18* 1.11*  CALCIUM 8.6* 7.6* 7.6* 7.5* 7.8*  MG  --  1.8  --  2.0  --     GFR: Estimated Creatinine Clearance: 45.9 mL/min (A) (by C-G formula based on SCr of 1.11 mg/dL (H)).   Recent Labs  Lab 09/29/17 1144  LIPASE 44   CBG: Recent Labs  Lab 09/29/17 1504  GLUCAP 106*      Radiology Studies: No results found.   Medications:  Scheduled: . atorvastatin  40 mg Oral  QHS  . methylPREDNISolone (SOLU-MEDROL) injection  60 mg Intravenous Q12H  . prednisoLONE acetate  1 drop Left Eye BID  . Tofacitinib Citrate  10 mg Oral BID   Continuous: . sodium chloride 100 mL/hr at 09/30/17 0916  . famotidine (PEPCID) IV Stopped (09/30/17 0950)   XJO:ITGPQDIYM, hydrocortisone, promethazine, senna-docusate  Assessment/Plan:    Acute diarrhea with hematochezia likely exacerbation of ulcerative colitis Gastroenterology is following.  Patient was started on intravenous steroids.  Symptoms appear to have improved.  Continue clear liquid diet.  She remains on Tofacitinib.  Apparently recent work-up was negative for C.  Difficile.    Severe hypokalemia Potassium level has improved with aggressive repletion.  Levels were low due to acute diarrhea.  Continue to monitor.  Acute Blood loss anemia Hemoglobin drop noted.  Likely a component of blood loss in the stool due to ulcerative colitis as well as dilution from IV fluids.  Continue to monitor closely.  Acute renal failure Secondary to hypovolemia.  She was hydrated.  Her renal function has improved this morning.  Monitor urine output.  Essential hypertension Blood pressure remains stable.  ARB on hold.  Metoprolol also on hold.  Could be resumed in the next 24 hours.  Hyperlipidemia Stable.  Continue statin.  GERD Stable.  Obstructive sleep apnea CPAP at nighttime  DVT Prophylaxis: SCDs    Code Status: Full code Family Communication: Discussed with the patient and her daughter-in-law Disposition Plan: Management as outlined above.    LOS: 1 day   [facility] Pager [phone] 09/30/2017, 11:11 AM  If 7PM-7AM, please contact night-coverage at [email or web address], password [email or web address]

## 2017-09-30 NOTE — Progress Notes (Signed)
Patient had a small amount of bowel movement this morning with small amount of blood noted.

## 2017-10-01 DIAGNOSIS — E860 Dehydration: Secondary | ICD-10-CM

## 2017-10-01 DIAGNOSIS — R197 Diarrhea, unspecified: Secondary | ICD-10-CM

## 2017-10-01 DIAGNOSIS — R1084 Generalized abdominal pain: Secondary | ICD-10-CM

## 2017-10-01 LAB — CBC
HCT: 32.3 % — ABNORMAL LOW (ref 36.0–46.0)
Hemoglobin: 9.8 g/dL — ABNORMAL LOW (ref 12.0–15.0)
MCH: 25.5 pg — AB (ref 26.0–34.0)
MCHC: 30.3 g/dL (ref 30.0–36.0)
MCV: 83.9 fL (ref 78.0–100.0)
PLATELETS: 435 10*3/uL — AB (ref 150–400)
RBC: 3.85 MIL/uL — AB (ref 3.87–5.11)
RDW: 18.7 % — ABNORMAL HIGH (ref 11.5–15.5)
WBC: 8 10*3/uL (ref 4.0–10.5)

## 2017-10-01 LAB — BASIC METABOLIC PANEL
Anion gap: 8 (ref 5–15)
BUN: 13 mg/dL (ref 6–20)
CALCIUM: 8.2 mg/dL — AB (ref 8.9–10.3)
CO2: 28 mmol/L (ref 22–32)
Chloride: 105 mmol/L (ref 101–111)
Creatinine, Ser: 1.09 mg/dL — ABNORMAL HIGH (ref 0.44–1.00)
GFR, EST AFRICAN AMERICAN: 57 mL/min — AB (ref >60)
GFR, EST NON AFRICAN AMERICAN: 49 mL/min — AB (ref >60)
Glucose, Bld: 126 mg/dL — ABNORMAL HIGH (ref 65–99)
POTASSIUM: 3.8 mmol/L (ref 3.5–5.1)
SODIUM: 141 mmol/L (ref 135–145)

## 2017-10-01 LAB — GASTROINTESTINAL PANEL BY PCR, STOOL (REPLACES STOOL CULTURE)

## 2017-10-01 MED ORDER — DIPHENHYDRAMINE HCL 50 MG/ML IJ SOLN
INTRAMUSCULAR | Status: AC
  Filled 2017-10-01: qty 1

## 2017-10-01 NOTE — Progress Notes (Signed)
PROGRESS NOTE  Isabella [name] SWH:[id number] DOB: [birth year] DOA: 09/29/2017 PCP: [name], MD  HPI/Recap of past 37 hours: 77 year old Caucasian female with a past medical history of ulcerative colitis, arthritis, granulomatous lung disease, obstructive sleep apnea using CPAP. She presented with acute diarrhea with hematochezia. Apparently patient has been taking prednisone and was started on Xeljanz 2 weeks ago by her gastroenterologist. Apparently her c-diff was negative today. She had been feeling dizzy and lightheaded and so decided to come to the ED. She is thought to have exacerbation of her ulcerative colitis.  She was hospitalized for further management.  10/01/2017: Patient seen and examined at her bedside.  She reports urgency to have a bowel movement.  Denies abdominal cramping or nausea.  GI panel in process.  Patient on enteric precaution until C. difficile ruled out.  Assessment/Plan: Principal Problem:   Acute on chronic colitis Active Problems:   Hyperlipidemia   Obstructive sleep apnea   Essential hypertension   Lung granuloma (HCC)   DJD (degenerative joint disease)   GERD (gastroesophageal reflux disease)  Acute diarrhea/hematochezia with suspicion for ulcerative colitis flare GI consulted and following.  Highly appreciated. GI recommends continue IV steroids then transition to oral steroids when patient is improved Continue IV Solu-Medrol 60 mg twice daily Continue Anusol daily as needed Continue Tofacitinib Continue IV Pepcid 20 mg twice daily to avoid GI ulcers from high-dose steroids GI panel pending On enteric precautions until C. difficile ruled out Monitor stool output Monitor electrolytes Maintain hydration by encouraging oral fluid intake Continue gentle IV hydration 50 cc/h of normal saline  Ulcerative colitis flare Management as stated above  Acute blood loss anemia most likely secondary to GI bleed with hematochezia GI following Hemoglobin  stable at 9.8 Based on hemoglobin 12 Continue to monitor Repeat CBC in the morning  Acute GI bleed Management as stated above  AKI on CKD 3, resolved GFR 49 Creatinine on presentation 1.47 Creatinine today 1.09 Avoid nephrotoxic agents/hypotension/dehydration Repeat BMP in the morning  Chronic diastolic CHF Last 2D echo done on 03/25/2011 revealed LVEF 55 to 60% with grade 1 diastolic dysfunction No acute exacerbation  Hypertension Blood pressure is stable Antihypertensive medications have been held due to acute blood loss At home on metoprolol, Lasix, and losartan  Hyper lipidemia Continue Lipitor    Code Status: Full  Family Communication: None at bedside  Disposition Plan: Home when clinically stable   Consultants:  GI  Procedures:  None  Antimicrobials:  None  DVT prophylaxis: SCDs.  No chemical DVT prophylaxis due to acute GI bleed   Objective: Vitals:   09/30/17 0549 09/30/17 1316 09/30/17 2139 10/01/17 0509  BP: 127/73 127/78 126/74 (!) 146/69  Pulse: 60 65 63 (!) 51  Resp: 18 17 16 16   Temp: 98.1 F (36.7 C) 98 F (36.7 C) 98 F (36.7 C) 98.4 F (36.9 C)  TempSrc: Oral Oral Oral Oral  SpO2: 99% 99% 99% 97%  Weight:    92.4 kg (203 lb 12.8 oz)  Height:        Intake/Output Summary (Last 24 hours) at 10/01/2017 1129 Last data filed at 10/01/2017 1034 Gross per 24 hour  Intake 2586.25 ml  Output -  Net 2586.25 ml   Filed Weights   09/29/17 2037 09/30/17 0500 10/01/17 0509  Weight: 91.9 kg (202 lb 9.6 oz) 91.9 kg (202 lb 9.6 oz) 92.4 kg (203 lb 12.8 oz)    Exam:  . General: 77 y.o. year-old female well developed  well nourished in no acute distress.  Alert and oriented x3. . Cardiovascular: Regular rate and rhythm with no rubs or gallops.  No thyromegaly or JVD noted.   Marland Kitchen Respiratory: Clear to auscultation with no wheezes or rales. Good inspiratory effort. . Abdomen: Soft nontender nondistended with normal bowel sounds x4  quadrants. . Musculoskeletal: No lower extremity edema. 2/4 pulses in all 4 extremities. . Skin: No ulcerative lesions noted or rashes, . Psychiatry: Mood is appropriate for condition and setting   Data Reviewed: CBC: Recent Labs  Lab 09/29/17 1144 09/30/17 0356 09/30/17 0944 10/01/17 0523  WBC 11.4* 7.4 8.0 8.0  HGB 12.1 9.1* 9.9* 9.8*  HCT 38.7 29.3* 32.9* 32.3*  MCV 81.8 84.0 84.8 83.9  PLT 576* 365 408* 371*   Basic Metabolic Panel: Recent Labs  Lab 09/29/17 1816 09/29/17 2250 09/30/17 0356 09/30/17 0933 10/01/17 0523  NA 137 137 138 138 141  K 3.1* 4.1 3.7 3.7 3.8  CL 100* 101 103 103 105  CO2 27 29 28 26 28   GLUCOSE 112* 170* 130* 145* 126*  BUN 11 10 9 12 13   CREATININE 1.34* 1.30* 1.18* 1.11* 1.09*  CALCIUM 7.6* 7.6* 7.5* 7.8* 8.2*  MG 1.8  --  2.0  --   --    GFR: Estimated Creatinine Clearance: 46.9 mL/min (A) (by C-G formula based on SCr of 1.09 mg/dL (H)). Liver Function Tests: No results for input(s): AST, ALT, ALKPHOS, BILITOT, PROT, ALBUMIN in the last 168 hours. Recent Labs  Lab 09/29/17 1144  LIPASE 44   No results for input(s): AMMONIA in the last 168 hours. Coagulation Profile: No results for input(s): INR, PROTIME in the last 168 hours. Cardiac Enzymes: No results for input(s): CKTOTAL, CKMB, CKMBINDEX, TROPONINI in the last 168 hours. BNP (last 3 results) No results for input(s): PROBNP in the last 8760 hours. HbA1C: No results for input(s): HGBA1C in the last 72 hours. CBG: Recent Labs  Lab 09/29/17 1504  GLUCAP 106*   Lipid Profile: No results for input(s): CHOL, HDL, LDLCALC, TRIG, CHOLHDL, LDLDIRECT in the last 72 hours. Thyroid Function Tests: No results for input(s): TSH, T4TOTAL, FREET4, T3FREE, THYROIDAB in the last 72 hours. Anemia Panel: No results for input(s): VITAMINB12, FOLATE, FERRITIN, TIBC, IRON, RETICCTPCT in the last 72 hours. Urine analysis:    Component Value Date/Time   COLORURINE YELLOW 09/30/2017 0201     APPEARANCEUR CLEAR 09/30/2017 0201   LABSPEC 1.005 09/30/2017 0201   PHURINE 6.0 09/30/2017 0201   GLUCOSEU NEGATIVE 09/30/2017 0201   HGBUR MODERATE (A) 09/30/2017 0201   BILIRUBINUR NEGATIVE 09/30/2017 0201   KETONESUR 5 (A) 09/30/2017 0201   PROTEINUR NEGATIVE 09/30/2017 0201   UROBILINOGEN 0.2 02/02/2014 2228   NITRITE NEGATIVE 09/30/2017 0201   LEUKOCYTESUR TRACE (A) 09/30/2017 0201   Sepsis Labs: @LABRCNTIP (procalcitonin:4,lacticidven:4)  )No results found for this or any previous visit (from the past 240 hour(s)).    Studies: No results found.  Scheduled Meds: . atorvastatin  40 mg Oral QHS  . methylPREDNISolone (SOLU-MEDROL) injection  60 mg Intravenous Q12H  . nystatin  5 mL Oral QID  . prednisoLONE acetate  1 drop Left Eye BID  . Tofacitinib Citrate  10 mg Oral BID    Continuous Infusions: . sodium chloride 75 mL/hr at 09/30/17 2258  . famotidine (PEPCID) IV 20 mg (10/01/17 0844)     LOS: 2 days     Kayleen Memos, MD [facility] Pager [phone]  If 7PM-7AM, please contact night-coverage [email or web address]  Password [email or web address] 10/01/2017, 11:29 AM

## 2017-10-01 NOTE — Progress Notes (Signed)
Set patient up on CPAP 12.0 CMH20 with patients home CPAP full face mask.  Patient states feels great.  RT will monitor.

## 2017-10-01 NOTE — Progress Notes (Signed)
[facility] Gastroenterology Progress Note  Isabella [name] 77 y.o. 1940/09/25   Subjective: Denies abdominal pain/N/V. Continues to have watery diarrhea but frequency has decreased.  Objective: Vital signs: Vitals:   09/30/17 2139 10/01/17 0509  BP: 126/74 (!) 146/69  Pulse: 63 (!) 51  Resp: 16 16  Temp: 98 F (36.7 C) 98.4 F (36.9 C)  SpO2: 99% 97%    Physical Exam: Gen: alert, no acute distress, elderly, pleasant HEENT: anicteric sclera CV: RRR Chest: CTA B Abd: minimal diffuse tenderness without guarding, soft, nondistended, +BS  Lab Results: Recent Labs    09/29/17 1816  09/30/17 0356 09/30/17 0933 10/01/17 0523  NA 137   < > 138 138 141  K 3.1*   < > 3.7 3.7 3.8  CL 100*   < > 103 103 105  CO2 27   < > 28 26 28   GLUCOSE 112*   < > 130* 145* 126*  BUN 11   < > 9 12 13   CREATININE 1.34*   < > 1.18* 1.11* 1.09*  CALCIUM 7.6*   < > 7.5* 7.8* 8.2*  MG 1.8  --  2.0  --   --    < > = values in this interval not displayed.   No results for input(s): AST, ALT, ALKPHOS, BILITOT, PROT, ALBUMIN in the last 72 hours. Recent Labs    09/30/17 0944 10/01/17 0523  WBC 8.0 8.0  HGB 9.9* 9.8*  HCT 32.9* 32.3*  MCV 84.8 83.9  PLT 408* 435*      Assessment/Plan: Ulcerative colitis flare - negative GI pathogen panel as outpt this week but inpt GI pathogen panel pending. Hgb stable. Continue IV steroids, Morrie Sheldon. Consider changing to Prednisone in next 1-2 days. Changed to full liquids. IVFs. Supportive care. Will follow.   [facility]. 10/01/2017, 8:42 AM  Questions please call [phone] ID: [name], female   DOB: 1940-06-19, 77 y.o.   MRN: [id number]

## 2017-10-02 LAB — BASIC METABOLIC PANEL
Anion gap: 7 (ref 5–15)
BUN: 15 mg/dL (ref 6–20)
CALCIUM: 8.2 mg/dL — AB (ref 8.9–10.3)
CO2: 26 mmol/L (ref 22–32)
CREATININE: 1.09 mg/dL — AB (ref 0.44–1.00)
Chloride: 108 mmol/L (ref 101–111)
GFR, EST AFRICAN AMERICAN: 57 mL/min — AB (ref >60)
GFR, EST NON AFRICAN AMERICAN: 49 mL/min — AB (ref >60)
Glucose, Bld: 149 mg/dL — ABNORMAL HIGH (ref 65–99)
Potassium: 3.9 mmol/L (ref 3.5–5.1)
SODIUM: 141 mmol/L (ref 135–145)

## 2017-10-02 LAB — CBC
HEMATOCRIT: 32.3 % — AB (ref 36.0–46.0)
HEMOGLOBIN: 9.7 g/dL — AB (ref 12.0–15.0)
MCH: 25.3 pg — AB (ref 26.0–34.0)
MCHC: 30 g/dL (ref 30.0–36.0)
MCV: 84.3 fL (ref 78.0–100.0)
PLATELETS: 441 10*3/uL — AB (ref 150–400)
RBC: 3.83 MIL/uL — AB (ref 3.87–5.11)
RDW: 19 % — ABNORMAL HIGH (ref 11.5–15.5)
WBC: 8.8 10*3/uL (ref 4.0–10.5)

## 2017-10-02 MED ORDER — AMLODIPINE BESYLATE 5 MG PO TABS
5.0000 mg | ORAL_TABLET | Freq: Every day | ORAL | Status: DC
  Administered 2017-10-02 – 2017-10-05 (×4): 5 mg via ORAL
  Filled 2017-10-02 (×4): qty 1

## 2017-10-02 MED ORDER — FAMOTIDINE 20 MG PO TABS
20.0000 mg | ORAL_TABLET | Freq: Two times a day (BID) | ORAL | Status: DC
  Administered 2017-10-02 – 2017-10-05 (×7): 20 mg via ORAL
  Filled 2017-10-02 (×7): qty 1

## 2017-10-02 NOTE — Progress Notes (Signed)
PROGRESS NOTE  Isabella [name] BSJ:[id number] DOB: [birth year] DOA: 09/29/2017 PCP: [name], MD  HPI/Recap of past 27 hours: 77 year old Caucasian female with a past medical history of ulcerative colitis, arthritis, granulomatous lung disease, obstructive sleep apnea using CPAP. She presented with acute diarrhea with hematochezia. Apparently patient has been taking prednisone and was started on Xeljanz 2 weeks ago by her gastroenterologist. Apparently her c-diff was negative today. She had been feeling dizzy and lightheaded and so decided to come to the ED. She is thought to have exacerbation of her ulcerative colitis.  She was hospitalized for further management.  10/01/2017: Patient seen and examined at her bedside.  She reports urgency to have a bowel movement.  Denies abdominal cramping or nausea.  GI panel in process.  Patient on enteric precaution until C. difficile ruled out.  10/02/17: No new complaints.  Assessment/Plan: Principal Problem:   Acute on chronic colitis Active Problems:   Hyperlipidemia   Obstructive sleep apnea   Essential hypertension   Lung granuloma (HCC)   DJD (degenerative joint disease)   GERD (gastroesophageal reflux disease)  Hematochezia, persistent with suspicion for ulcerative colitis flare GI consulted and following.  Highly appreciated. C/w IV steroids then transition to oral steroids when patient is improved Continue IV Solu-Medrol 60 mg twice daily Continue Anusol daily as needed Continue Tofacitinib C/w Pepcid 20 mg twice daily to avoid GI ulcers from high-dose steroids GI panel negative Monitor stool output Monitor electrolytes Maintain hydration by encouraging oral fluid intake  Ulcerative colitis flare GI following Management as stated above  Acute blood loss anemia, stabl most likely secondary to GI bleed with hematochezia GI following Hemoglobin stable at 9.8 Based on hemoglobin 12 Continue to monitor Repeat CBC in the  morning  [place] GI bleed Management as stated above GI following  AKI on CKD 3, resolved GFR 49 Creatinine on presentation 1.47 Creatinine today 1.09 Avoid nephrotoxic agents/hypotension/dehydration Repeat BMP in the morning  Chronic diastolic CHF Last 2D echo done on 03/25/2011 revealed LVEF 55 to 60% with grade 1 diastolic dysfunction No acute exacerbation  HTN, uncontrolled Started amlodipine Allergy to lisinopril  HLD Continue Lipitor    Code Status: Full  Family Communication: None at bedside  Disposition Plan: Home when clinically stable   Consultants:  GI  Procedures:  None  Antimicrobials:  None  DVT prophylaxis: SCDs.  No chemical DVT prophylaxis due to acute GI bleed   Objective: Vitals:   10/02/17 0543 10/02/17 1322 10/02/17 1443 10/02/17 1953  BP: (!) 148/88 (!) 151/78 (!) 159/86 137/80  Pulse:  60 63 61  Resp:  16 17 16   Temp:   98.5 F (36.9 C) 97.7 F (36.5 C)  TempSrc:    Oral  SpO2:  100% 100% 100%  Weight:      Height:        Intake/Output Summary (Last 24 hours) at 10/02/2017 2044 Last data filed at 10/02/2017 1057 Gross per 24 hour  Intake 922.5 ml  Output -  Net 922.5 ml   Filed Weights   09/30/17 0500 10/01/17 0509 10/02/17 0500  Weight: 91.9 kg (202 lb 9.6 oz) 92.4 kg (203 lb 12.8 oz) 94.1 kg (207 lb 7.3 oz)    Exam:  . General: 77 y.o. year-old female WD wN NAD A&O x3. . Cardiovascular: RRR no rubs or gallops.   Marland Kitchen Respiratory: Clear to auscultation with no wheezes or rales. Good inspiratory effort. . Abdomen: Soft nontender nondistended with normal bowel sounds x4 quadrants. Marland Kitchen  Musculoskeletal: No lower extremity edema. 2/4 pulses in all 4 extremities. . Skin: No ulcerative lesions noted or rashes, . Psychiatry: Mood is appropriate for condition and setting   Data Reviewed: CBC: Recent Labs  Lab 09/29/17 1144 09/30/17 0356 09/30/17 0944 10/01/17 0523 10/02/17 0342  WBC 11.4* 7.4 8.0 8.0 8.8  HGB 12.1  9.1* 9.9* 9.8* 9.7*  HCT 38.7 29.3* 32.9* 32.3* 32.3*  MCV 81.8 84.0 84.8 83.9 84.3  PLT 576* 365 408* 435* 453*   Basic Metabolic Panel: Recent Labs  Lab 09/29/17 1816 09/29/17 2250 09/30/17 0356 09/30/17 0933 10/01/17 0523 10/02/17 0342  NA 137 137 138 138 141 141  K 3.1* 4.1 3.7 3.7 3.8 3.9  CL 100* 101 103 103 105 108  CO2 27 29 28 26 28 26   GLUCOSE 112* 170* 130* 145* 126* 149*  BUN 11 10 9 12 13 15   CREATININE 1.34* 1.30* 1.18* 1.11* 1.09* 1.09*  CALCIUM 7.6* 7.6* 7.5* 7.8* 8.2* 8.2*  MG 1.8  --  2.0  --   --   --    GFR: Estimated Creatinine Clearance: 47.4 mL/min (A) (by C-G formula based on SCr of 1.09 mg/dL (H)). Liver Function Tests: No results for input(s): AST, ALT, ALKPHOS, BILITOT, PROT, ALBUMIN in the last 168 hours. Recent Labs  Lab 09/29/17 1144  LIPASE 44   No results for input(s): AMMONIA in the last 168 hours. Coagulation Profile: No results for input(s): INR, PROTIME in the last 168 hours. Cardiac Enzymes: No results for input(s): CKTOTAL, CKMB, CKMBINDEX, TROPONINI in the last 168 hours. BNP (last 3 results) No results for input(s): PROBNP in the last 8760 hours. HbA1C: No results for input(s): HGBA1C in the last 72 hours. CBG: Recent Labs  Lab 09/29/17 1504  GLUCAP 106*   Lipid Profile: No results for input(s): CHOL, HDL, LDLCALC, TRIG, CHOLHDL, LDLDIRECT in the last 72 hours. Thyroid Function Tests: No results for input(s): TSH, T4TOTAL, FREET4, T3FREE, THYROIDAB in the last 72 hours. Anemia Panel: No results for input(s): VITAMINB12, FOLATE, FERRITIN, TIBC, IRON, RETICCTPCT in the last 72 hours. Urine analysis:    Component Value Date/Time   COLORURINE YELLOW 09/30/2017 0201   APPEARANCEUR CLEAR 09/30/2017 0201   LABSPEC 1.005 09/30/2017 0201   PHURINE 6.0 09/30/2017 0201   GLUCOSEU NEGATIVE 09/30/2017 0201   HGBUR MODERATE (A) 09/30/2017 0201   BILIRUBINUR NEGATIVE 09/30/2017 0201   KETONESUR 5 (A) 09/30/2017 0201   PROTEINUR  NEGATIVE 09/30/2017 0201   UROBILINOGEN 0.2 02/02/2014 2228   NITRITE NEGATIVE 09/30/2017 0201   LEUKOCYTESUR TRACE (A) 09/30/2017 0201   Sepsis Labs: @LABRCNTIP (procalcitonin:4,lacticidven:4)  ) Recent Results (from the past 240 hour(s))  Gastrointestinal Panel by PCR , Stool     Status: None   Collection Time: 09/30/17  2:46 PM  Result Value Ref Range Status   Campylobacter species NOT DETECTED NOT DETECTED Final   Plesimonas shigelloides NOT DETECTED NOT DETECTED Final   Salmonella species NOT DETECTED NOT DETECTED Final   Yersinia enterocolitica NOT DETECTED NOT DETECTED Final   Vibrio species NOT DETECTED NOT DETECTED Final   Vibrio cholerae NOT DETECTED NOT DETECTED Final   Enteroaggregative E coli (EAEC) NOT DETECTED NOT DETECTED Final   Enteropathogenic E coli (EPEC) NOT DETECTED NOT DETECTED Final   Enterotoxigenic E coli (ETEC) NOT DETECTED NOT DETECTED Final   Shiga like toxin producing E coli (STEC) NOT DETECTED NOT DETECTED Final   Shigella/Enteroinvasive E coli (EIEC) NOT DETECTED NOT DETECTED Final   Cryptosporidium NOT DETECTED  NOT DETECTED Final   Cyclospora cayetanensis NOT DETECTED NOT DETECTED Final   Entamoeba histolytica NOT DETECTED NOT DETECTED Final   Giardia lamblia NOT DETECTED NOT DETECTED Final   Adenovirus F40/41 NOT DETECTED NOT DETECTED Final   Astrovirus NOT DETECTED NOT DETECTED Final   Norovirus GI/GII NOT DETECTED NOT DETECTED Final   Rotavirus A NOT DETECTED NOT DETECTED Final   Sapovirus (I, II, IV, and V) NOT DETECTED NOT DETECTED Final    Comment: Performed at [facility], [address], [place], [place] [address]      Studies: No results found.  Scheduled Meds: . amLODipine  5 mg Oral Daily  . atorvastatin  40 mg Oral QHS  . famotidine  20 mg Oral BID  . methylPREDNISolone (SOLU-MEDROL) injection  60 mg Intravenous Q12H  . nystatin  5 mL Oral QID  . prednisoLONE acetate  1 drop Left Eye BID  . Tofacitinib Citrate  10  mg Oral BID    Continuous Infusions:    LOS: 3 days     Kayleen Memos, MD [facility] Pager [phone]  If 7PM-7AM, please contact night-coverage [email or web address] Password [facility] 10/02/2017, 8:44 PM

## 2017-10-02 NOTE — Progress Notes (Signed)
Patient stated that she would put the CPAP mask on herself.  CPAP is set at 12 cmH2O with no O2 bleed in .  RT filled water chamber at patient's request and advised that she could call at any time if she needed any more assistance.     10/02/17 2023  BiPAP/CPAP/SIPAP  BiPAP/CPAP/SIPAP Pt Type Adult  Mask Type Full face mask (home mask and tubing)  Respiratory Rate 18 breaths/min  EPAP 12 cmH2O  Oxygen Percent 21 %  Flow Rate 0 lpm  BiPAP/CPAP/SIPAP CPAP  Patient Home Equipment No (home mask and tubing)  Auto Titrate No  BiPAP/CPAP /SiPAP Vitals  Bilateral Breath Sounds Clear;Diminished

## 2017-10-02 NOTE — Care Management Important Message (Signed)
Important Message  Patient Details  Name: Isabella [name] MRN: [id number] Date of Birth: [birth year]   Medicare Important Message Given:  Yes    Orbie Pyo 10/02/2017, 12:19 PM

## 2017-10-02 NOTE — Progress Notes (Signed)
[facility] Gastroenterology Progress Note  Isabella [name] 77 y.o. May 01, 1941   Subjective: Stool frequency decreased to once every 6 hours and reports that one episode was mushy and nonbloody and other episodes were loose. BM this morning reportedly loose with blood in it. Denies abdominal pain. Sitting in bedside chair. Tolerating liquid diet. Walked in halls with her daughter-in-law.  Objective: Vital signs: Vitals:   10/02/17 0532 10/02/17 0543  BP: (!) 163/81 (!) 148/88  Pulse: (!) 56   Resp: 16   Temp: 98.2 F (36.8 C)   SpO2: 100%     Physical Exam: Gen: alert, no acute distress  HEENT: anicteric sclera CV: RRR Chest: CTA B Abd: minimal lower abdominal tenderness without guarding, soft, nondistended, +BS  Lab Results: Recent Labs    09/29/17 1816  09/30/17 0356  10/01/17 0523 10/02/17 0342  NA 137   < > 138   < > 141 141  K 3.1*   < > 3.7   < > 3.8 3.9  CL 100*   < > 103   < > 105 108  CO2 27   < > 28   < > 28 26  GLUCOSE 112*   < > 130*   < > 126* 149*  BUN 11   < > 9   < > 13 15  CREATININE 1.34*   < > 1.18*   < > 1.09* 1.09*  CALCIUM 7.6*   < > 7.5*   < > 8.2* 8.2*  MG 1.8  --  2.0  --   --   --    < > = values in this interval not displayed.   No results for input(s): AST, ALT, ALKPHOS, BILITOT, PROT, ALBUMIN in the last 72 hours. Recent Labs    10/01/17 0523 10/02/17 0342  WBC 8.0 8.8  HGB 9.8* 9.7*  HCT 32.3* 32.3*  MCV 83.9 84.3  PLT 435* 441*      Assessment/Plan: Ulcerative colitis flare slowly improving - continue IV steroids another day and then change to Prednisone 60 mg/day tomorrow. Advance diet to low fat diet. If tolerates transition to Prednisone and advancing of diet then ok to d/c late tomorrow or Saturday morning. At discharge will continue Morrie Sheldon, restart Apriso, and continue Prednisone 60 mg/day until f/u with me in 2 weeks. At f/u will likely change to Remicade. Will f/u tomorrow.   Spiro C. 10/02/2017, 9:50  AM  Questions please call [phone] ID: Isabella [name], female   DOB: 02/26/1941, 77 y.o.   MRN: [id number]

## 2017-10-03 MED ORDER — PREDNISONE 10 MG PO TABS: 60.0000 mg | ORAL_TABLET | Freq: Every day | ORAL | Status: DC

## 2017-10-03 MED ORDER — PREDNISONE 50 MG PO TABS
60.0000 mg | ORAL_TABLET | Freq: Every day | ORAL | Status: DC
  Administered 2017-10-03 – 2017-10-05 (×3): 60 mg via ORAL
  Filled 2017-10-03 (×3): qty 1

## 2017-10-03 NOTE — Progress Notes (Signed)
[facility] Gastroenterology Progress Note  MARSHA GUNDLACH 77 y.o. [birth year]   Subjective: Diarrhea improving reporting some form occurring in stools. Small amount of blood. Denies abdominal pain. In bedside chair. Feels good.  Objective: Vital signs: Vitals:   10/02/17 1953 10/03/17 0526  BP: 137/80 (!) 142/70  Pulse: 61 (!) 51  Resp: 16 16  Temp: 97.7 F (36.5 C) (!) 97.1 F (36.2 C)  SpO2: 100% 97%    Physical Exam: Gen: alert, no acute distress, elderly, pleasant HEENT: anicteric sclera CV: RRR Chest: CTA B Abd: soft, nontender, nondistended, +BS  Lab Results: Recent Labs    10/01/17 0523 10/02/17 0342  NA 141 141  K 3.8 3.9  CL 105 108  CO2 28 26  GLUCOSE 126* 149*  BUN 13 15  CREATININE 1.09* 1.09*  CALCIUM 8.2* 8.2*   No results for input(s): AST, ALT, ALKPHOS, BILITOT, PROT, ALBUMIN in the last 72 hours. Recent Labs    10/01/17 0523 10/02/17 0342  WBC 8.0 8.8  HGB 9.8* 9.7*  HCT 32.3* 32.3*  MCV 83.9 84.3  PLT 435* 441*      Assessment/Plan: Ulcerative colitis flare - slowly resolving. Hgb stable at 9.7. Changed to PO Prednisone 60 mg/day today. If stable ok to d/c tomorrow on Prednisone 60 mg/day until f/u with me in 1-2 weeks. Continue Morrie Sheldon. Resume Apriso at d/c. Will discuss changing to Remicade from [place] at f/u appt.    Jordan C. 10/03/2017, 10:41 AM  Questions please call [phone] ID: [name], female   DOB: 02/17/1941, 77 y.o.   MRN: [id number]

## 2017-10-03 NOTE — Progress Notes (Signed)
PROGRESS NOTE  Isabella [name] WNI:[id number] DOB: Apr 30, 1941 DOA: 09/29/2017 PCP: [name], MD  HPI/Recap of past 61 hours: 77 year old Caucasian female with a past medical history of ulcerative colitis, arthritis, granulomatous lung disease, obstructive sleep apnea using CPAP. She presented with acute diarrhea with hematochezia. Apparently patient has been taking prednisone and was started on Xeljanz 2 weeks ago by her gastroenterologist. Apparently her c-diff was negative today. She had been feeling dizzy and lightheaded and so decided to come to the ED. She is thought to have exacerbation of her ulcerative colitis.  She was hospitalized for further management.  10/03/17: GI panel negative, formed stools. Denies abdominal cramping. GI following.  Assessment/Plan: Principal Problem:   Acute on chronic colitis Active Problems:   Hyperlipidemia   Obstructive sleep apnea   Essential hypertension   Lung granuloma (HCC)   DJD (degenerative joint disease)   GERD (gastroesophageal reflux disease)  Hematochezia, improving Switch to po steroids as recommended by GI with suspicion for ulcerative colitis flare Continue Anusol daily as needed Continue Tofacitinib C/w Pepcid 20 mg twice daily to avoid GI ulcers from high-dose steroids GI panel negative Monitor stool output Monitor electrolytes Maintain hydration by encouraging oral fluid intake  Ulcerative colitis flare, improving GI following Management as stated above  Acute blood loss anemia, stable most likely secondary to GI bleed with hematochezia Repeat CBC in the morning  Lower GI bleed, improving Management as stated above GI following  AKI on CKD 3, resolved GFR 49 Creatinine on presentation 1.47 Creatinine today 1.09 Avoid nephrotoxic agents/hypotension/dehydration Repeat BMP in the morning  Chronic diastolic CHF Last 2D echo done on 03/25/2011 revealed LVEF 55 to 60% with grade 1 diastolic dysfunction No  acute exacerbation  HTN, stable C/w amlodipine Allergy to lisinopril  HLD Continue Lipitor    Code Status: Full  Family Communication: None at bedside  Disposition Plan: Home when clinically stable   Consultants:  GI  Procedures:  None  Antimicrobials:  None  DVT prophylaxis: SCDs.  No chemical DVT prophylaxis due to acute GI bleed   Objective: Vitals:   10/02/17 1443 10/02/17 1953 10/03/17 0524 10/03/17 0526  BP: (!) 159/86 137/80  (!) 142/70  Pulse: 63 61  (!) 51  Resp: 17 16  16   Temp: 98.5 F (36.9 C) 97.7 F (36.5 C)  (!) 97.1 F (36.2 C)  TempSrc:  Oral  Axillary  SpO2: 100% 100%  97%  Weight:   95.1 kg (209 lb 10.5 oz)   Height:        Intake/Output Summary (Last 24 hours) at 10/03/2017 1335 Last data filed at 10/03/2017 1131 Gross per 24 hour  Intake 222 ml  Output -  Net 222 ml   Filed Weights   10/01/17 0509 10/02/17 0500 10/03/17 0524  Weight: 92.4 kg (203 lb 12.8 oz) 94.1 kg (207 lb 7.3 oz) 95.1 kg (209 lb 10.5 oz)    Exam:  . General: 77 y.o. year-old female WD WN NAD. A&O x 3. . Cardiovascular: RRR no rubs or gallops. NO JVD or thyromegaly.  Marland Kitchen Respiratory: Clear to auscultation with no wheezes or rales. Good inspiratory effort. . Abdomen: Soft nontender nondistended with normal bowel sounds x4 quadrants. . Musculoskeletal: No lower extremity edema. 2/4 pulses in all 4 extremities. . Skin: No ulcerative lesions noted or rashes, . Psychiatry: Mood is appropriate for condition and setting   Data Reviewed: CBC: Recent Labs  Lab 09/29/17 1144 09/30/17 0356 09/30/17 3818 10/01/17 0523 10/02/17 2993  WBC 11.4* 7.4 8.0 8.0 8.8  HGB 12.1 9.1* 9.9* 9.8* 9.7*  HCT 38.7 29.3* 32.9* 32.3* 32.3*  MCV 81.8 84.0 84.8 83.9 84.3  PLT 576* 365 408* 435* 591*   Basic Metabolic Panel: Recent Labs  Lab 09/29/17 1816 09/29/17 2250 09/30/17 0356 09/30/17 0933 10/01/17 0523 10/02/17 0342  NA 137 137 138 138 141 141  K 3.1* 4.1 3.7  3.7 3.8 3.9  CL 100* 101 103 103 105 108  CO2 27 29 28 26 28 26   GLUCOSE 112* 170* 130* 145* 126* 149*  BUN 11 10 9 12 13 15   CREATININE 1.34* 1.30* 1.18* 1.11* 1.09* 1.09*  CALCIUM 7.6* 7.6* 7.5* 7.8* 8.2* 8.2*  MG 1.8  --  2.0  --   --   --    GFR: Estimated Creatinine Clearance: 47.7 mL/min (A) (by C-G formula based on SCr of 1.09 mg/dL (H)). Liver Function Tests: No results for input(s): AST, ALT, ALKPHOS, BILITOT, PROT, ALBUMIN in the last 168 hours. Recent Labs  Lab 09/29/17 1144  LIPASE 44   No results for input(s): AMMONIA in the last 168 hours. Coagulation Profile: No results for input(s): INR, PROTIME in the last 168 hours. Cardiac Enzymes: No results for input(s): CKTOTAL, CKMB, CKMBINDEX, TROPONINI in the last 168 hours. BNP (last 3 results) No results for input(s): PROBNP in the last 8760 hours. HbA1C: No results for input(s): HGBA1C in the last 72 hours. CBG: Recent Labs  Lab 09/29/17 1504  GLUCAP 106*   Lipid Profile: No results for input(s): CHOL, HDL, LDLCALC, TRIG, CHOLHDL, LDLDIRECT in the last 72 hours. Thyroid Function Tests: No results for input(s): TSH, T4TOTAL, FREET4, T3FREE, THYROIDAB in the last 72 hours. Anemia Panel: No results for input(s): VITAMINB12, FOLATE, FERRITIN, TIBC, IRON, RETICCTPCT in the last 72 hours. Urine analysis:    Component Value Date/Time   COLORURINE YELLOW 09/30/2017 0201   APPEARANCEUR CLEAR 09/30/2017 0201   LABSPEC 1.005 09/30/2017 0201   PHURINE 6.0 09/30/2017 0201   GLUCOSEU NEGATIVE 09/30/2017 0201   HGBUR MODERATE (A) 09/30/2017 0201   BILIRUBINUR NEGATIVE 09/30/2017 0201   KETONESUR 5 (A) 09/30/2017 0201   PROTEINUR NEGATIVE 09/30/2017 0201   UROBILINOGEN 0.2 02/02/2014 2228   NITRITE NEGATIVE 09/30/2017 0201   LEUKOCYTESUR TRACE (A) 09/30/2017 0201   Sepsis Labs: @LABRCNTIP (procalcitonin:4,lacticidven:4)  ) Recent Results (from the past 240 hour(s))  Gastrointestinal Panel by PCR , Stool      Status: None   Collection Time: 09/30/17  2:46 PM  Result Value Ref Range Status   Campylobacter species NOT DETECTED NOT DETECTED Final   Plesimonas shigelloides NOT DETECTED NOT DETECTED Final   Salmonella species NOT DETECTED NOT DETECTED Final   Yersinia enterocolitica NOT DETECTED NOT DETECTED Final   Vibrio species NOT DETECTED NOT DETECTED Final   Vibrio cholerae NOT DETECTED NOT DETECTED Final   Enteroaggregative E coli (EAEC) NOT DETECTED NOT DETECTED Final   Enteropathogenic E coli (EPEC) NOT DETECTED NOT DETECTED Final   Enterotoxigenic E coli (ETEC) NOT DETECTED NOT DETECTED Final   Shiga like toxin producing E coli (STEC) NOT DETECTED NOT DETECTED Final   Shigella/Enteroinvasive E coli (EIEC) NOT DETECTED NOT DETECTED Final   Cryptosporidium NOT DETECTED NOT DETECTED Final   Cyclospora cayetanensis NOT DETECTED NOT DETECTED Final   Entamoeba histolytica NOT DETECTED NOT DETECTED Final   Giardia lamblia NOT DETECTED NOT DETECTED Final   Adenovirus F40/41 NOT DETECTED NOT DETECTED Final   Astrovirus NOT DETECTED NOT DETECTED Final  Norovirus GI/GII NOT DETECTED NOT DETECTED Final   Rotavirus A NOT DETECTED NOT DETECTED Final   Sapovirus (I, II, IV, and V) NOT DETECTED NOT DETECTED Final    Comment: Performed at [facility], [address] [place], [place] [address]      Studies: No results found.  Scheduled Meds: . amLODipine  5 mg Oral Daily  . atorvastatin  40 mg Oral QHS  . famotidine  20 mg Oral BID  . nystatin  5 mL Oral QID  . prednisoLONE acetate  1 drop Left Eye BID  . [START ON 10/04/2017] predniSONE  60 mg Oral Q breakfast  . Tofacitinib Citrate  10 mg Oral BID    Continuous Infusions:    LOS: 4 days     Kayleen Memos, MD [facility] Pager [phone]  If 7PM-7AM, please contact night-coverage [email or web address] Password [email or web address] 10/03/2017, 1:35 PM

## 2017-10-04 DIAGNOSIS — K529 Noninfective gastroenteritis and colitis, unspecified: Secondary | ICD-10-CM

## 2017-10-04 LAB — CBC
HCT: 33.4 % — ABNORMAL LOW (ref 36.0–46.0)
Hemoglobin: 10.1 g/dL — ABNORMAL LOW (ref 12.0–15.0)
MCH: 25.5 pg — ABNORMAL LOW (ref 26.0–34.0)
MCHC: 30.2 g/dL (ref 30.0–36.0)
MCV: 84.3 fL (ref 78.0–100.0)
PLATELETS: 351 10*3/uL (ref 150–400)
RBC: 3.96 MIL/uL (ref 3.87–5.11)
RDW: 19.1 % — AB (ref 11.5–15.5)
WBC: 11.5 10*3/uL — AB (ref 4.0–10.5)

## 2017-10-04 MED ORDER — LOSARTAN POTASSIUM 50 MG PO TABS
50.0000 mg | ORAL_TABLET | Freq: Every day | ORAL | Status: DC
  Administered 2017-10-04 – 2017-10-05 (×2): 50 mg via ORAL
  Filled 2017-10-04 (×2): qty 1

## 2017-10-04 NOTE — Progress Notes (Signed)
[facility] PROGRESS NOTE  Isabella [name] KCM:[id number] DOB: 1941/02/24 DOA: 09/29/2017 PCP: [name], MD  Brief summary   77 year old Caucasian female with a past medical history of ulcerative colitis, arthritis, granulomatous lung disease, obstructive sleep apnea using CPAP. She presented with acute diarrhea with hematochezia. Apparently patient has been taking prednisone and was started on Xeljanz 2 weeks ago by her gastroenterologist. Apparently her c-diff was negative. She hadbeen feeling dizzy and lightheaded and so decided to come to the ED. She is thought to have exacerbation of her ulcerative colitis. She was hospitalized for further management.   Assessment/Plan:  Hematochezia, improving. Switch to po steroids as recommended by GI with suspicion for ulcerative colitis flare. Continue Anusol daily as needed. Continue Tofacitinib. C/w Pepcid 20 mg twice daily to avoid GI ulcers from high-dose steroids. GI panel negative.  Had some 4-5 diarrhea like bowel movement overnight. Will monitor 24 hrs. Per GI: Prednisone 60 mg/day until f/u with me in 1-2 weeks. Continue Morrie Sheldon. Resume Apriso at d/c. Will discuss changing to Remicade from [place] at f/u appt.   Ulcerative colitis flare, improving. GI following. Management as stated above  Acute blood loss anemia, stable. most likely secondary to GI bleed with hematochezia. Repeat CBC is stable 10.1  Lower GI bleed, improving. Management as stated above   AKI on CKD 3, resolved. GFR 49. Creatinine on presentation 1.47. Avoid nephrotoxic agents/hypotension/dehydration  Chronic diastolic CHF. Last 2D echo done on 03/25/2011 revealed LVEF 55 to 60% with grade 1 diastolic dysfunction. No acute exacerbation  HTN. uncontrolled on amlodipine. Reports taking Losartan. Resume   HLD. Continue Lipitor     Code Status: full Family Communication: d/w patient, RN (indicate person spoken with, relationship, and if by phone,  the number) Disposition Plan: home possible AM   Consultants:  GI  Procedures:  none  Antibiotics: Anti-infectives (From admission, onward)   None        (indicate start date, and stop date if known)  HPI/Subjective: Reports 4-5 diarrhea overnight. But says that she is improving   Objective: Vitals:   10/04/17 0534 10/04/17 0602  BP: (!) 182/97 (!) 160/108  Pulse: 68   Resp:    Temp: 97.7 F (36.5 C)   SpO2:      Intake/Output Summary (Last 24 hours) at 10/04/2017 0910 Last data filed at 10/03/2017 1503 Gross per 24 hour  Intake 548 ml  Output -  Net 548 ml   Filed Weights   10/02/17 0500 10/03/17 0524 10/04/17 0536  Weight: 94.1 kg (207 lb 7.3 oz) 95.1 kg (209 lb 10.5 oz) 95.5 kg (210 lb 9.6 oz)    Exam:   General:  No distress   Cardiovascular: s1,s2 rrr  Respiratory: CTA BL   Abdomen: soft, nt   Musculoskeletal: no leg edema    Data Reviewed: Basic Metabolic Panel: Recent Labs  Lab 09/29/17 1816 09/29/17 2250 09/30/17 0356 09/30/17 0933 10/01/17 0523 10/02/17 0342  NA 137 137 138 138 141 141  K 3.1* 4.1 3.7 3.7 3.8 3.9  CL 100* 101 103 103 105 108  CO2 27 29 28 26 28 26   GLUCOSE 112* 170* 130* 145* 126* 149*  BUN 11 10 9 12 13 15   CREATININE 1.34* 1.30* 1.18* 1.11* 1.09* 1.09*  CALCIUM 7.6* 7.6* 7.5* 7.8* 8.2* 8.2*  MG 1.8  --  2.0  --   --   --    Liver Function Tests: No results for input(s): AST, ALT, ALKPHOS, BILITOT, PROT, ALBUMIN  in the last 168 hours. Recent Labs  Lab 09/29/17 1144  LIPASE 44   No results for input(s): AMMONIA in the last 168 hours. CBC: Recent Labs  Lab 09/30/17 0356 09/30/17 0944 10/01/17 0523 10/02/17 0342 10/04/17 0323  WBC 7.4 8.0 8.0 8.8 11.5*  HGB 9.1* 9.9* 9.8* 9.7* 10.1*  HCT 29.3* 32.9* 32.3* 32.3* 33.4*  MCV 84.0 84.8 83.9 84.3 84.3  PLT 365 408* 435* 441* 351   Cardiac Enzymes: No results for input(s): CKTOTAL, CKMB, CKMBINDEX, TROPONINI in the last 168 hours. BNP (last 3  results) No results for input(s): BNP in the last 8760 hours.  ProBNP (last 3 results) No results for input(s): PROBNP in the last 8760 hours.  CBG: Recent Labs  Lab 09/29/17 1504  GLUCAP 106*    Recent Results (from the past 240 hour(s))  Gastrointestinal Panel by PCR , Stool     Status: None   Collection Time: 09/30/17  2:46 PM  Result Value Ref Range Status   Campylobacter species NOT DETECTED NOT DETECTED Final   Plesimonas shigelloides NOT DETECTED NOT DETECTED Final   Salmonella species NOT DETECTED NOT DETECTED Final   Yersinia enterocolitica NOT DETECTED NOT DETECTED Final   Vibrio species NOT DETECTED NOT DETECTED Final   Vibrio cholerae NOT DETECTED NOT DETECTED Final   Enteroaggregative E coli (EAEC) NOT DETECTED NOT DETECTED Final   Enteropathogenic E coli (EPEC) NOT DETECTED NOT DETECTED Final   Enterotoxigenic E coli (ETEC) NOT DETECTED NOT DETECTED Final   Shiga like toxin producing E coli (STEC) NOT DETECTED NOT DETECTED Final   Shigella/Enteroinvasive E coli (EIEC) NOT DETECTED NOT DETECTED Final   Cryptosporidium NOT DETECTED NOT DETECTED Final   Cyclospora cayetanensis NOT DETECTED NOT DETECTED Final   Entamoeba histolytica NOT DETECTED NOT DETECTED Final   Giardia lamblia NOT DETECTED NOT DETECTED Final   Adenovirus F40/41 NOT DETECTED NOT DETECTED Final   Astrovirus NOT DETECTED NOT DETECTED Final   Norovirus GI/GII NOT DETECTED NOT DETECTED Final   Rotavirus A NOT DETECTED NOT DETECTED Final   Sapovirus (I, II, IV, and V) NOT DETECTED NOT DETECTED Final    Comment: Performed at [facility], [address] [place], [place] [address]     Studies: No results found.  Scheduled Meds: . amLODipine  5 mg Oral Daily  . atorvastatin  40 mg Oral QHS  . famotidine  20 mg Oral BID  . nystatin  5 mL Oral QID  . prednisoLONE acetate  1 drop Left Eye BID  . predniSONE  60 mg Oral Q breakfast  . Tofacitinib Citrate  10 mg Oral BID   Continuous  Infusions:  Principal Problem:   Acute on chronic colitis Active Problems:   Hyperlipidemia   Obstructive sleep apnea   Essential hypertension   Lung granuloma (HCC)   DJD (degenerative joint disease)   GERD (gastroesophageal reflux disease)    Time spent: >35 minutes     Kinnie Feil  [facility] Pager [phone]. If 7PM-7AM, please contact night-coverage at [email or web address], password [email or web address] 10/04/2017, 9:10 AM  LOS: 5 days

## 2017-10-04 NOTE — Progress Notes (Signed)
Patient BP 160/108.  Notified NP X.  Blount.  Will continue to monitor the patient and notify MD if needed

## 2017-10-04 NOTE — Progress Notes (Signed)
[facility] Gastroenterology Progress Note  Isabella [name] 77 y.o. [birth year]   Subjective: Reports 5 small episodes of bloody diarrhea since 5 AM. Frustrated with recurrence of diarrhea. Denies abdominal pain. Lying in bed.  Objective: Vital signs: Vitals:   10/04/17 0534 10/04/17 0602  BP: (!) 182/97 (!) 160/108  Pulse: 68   Resp:    Temp: 97.7 F (36.5 C)   SpO2:    R 16; SpO2 99%  Physical Exam: Gen: alert, no acute distress, elderly  HEENT: anicteric sclera CV: RRR Chest: CTA B Abd: diffuse tenderness with guarding, soft, nondistended, +BS Ext: no edema  Lab Results: Recent Labs    10/02/17 0342  NA 141  K 3.9  CL 108  CO2 26  GLUCOSE 149*  BUN 15  CREATININE 1.09*  CALCIUM 8.2*   No results for input(s): AST, ALT, ALKPHOS, BILITOT, PROT, ALBUMIN in the last 72 hours. Recent Labs    10/02/17 0342 10/04/17 0323  WBC 8.8 11.5*  HGB 9.7* 10.1*  HCT 32.3* 33.4*  MCV 84.3 84.3  PLT 441* 351      Assessment/Plan: Ulcerative colitis flare slow to resolve - continue PO Prednisone. Soft diet. Supportive care. Keep inpatient another day and if stools improve then ok to go home tomorrow from GI standpoint. Encouraged ambulation with staff assistance. Will follow.   [place] C. 10/04/2017, 1:12 PM  Questions please call [phone] ID: Isabella [name], female   DOB: 07-Oct-1940, 77 y.o.   MRN: [id number]

## 2017-10-04 NOTE — Progress Notes (Signed)
Pt is able to place self on/off cpap.  Knows to contact RN for RT if needed.  Water placed in chamber before leaving.  RT will monitor as needed.

## 2017-10-05 DIAGNOSIS — K921 Melena: Secondary | ICD-10-CM

## 2017-10-05 MED ORDER — PREDNISONE 20 MG PO TABS: 60.0000 mg | ORAL_TABLET | Freq: Every day | ORAL | 0 refills | Status: DC

## 2017-10-05 MED ORDER — NYSTATIN 100000 UNIT/ML MT SUSP: 5.0000 mL | Freq: Four times a day (QID) | OROMUCOSAL | 0 refills | Status: DC

## 2017-10-05 MED ORDER — FUROSEMIDE 40 MG PO TABS: 20.0000 mg | ORAL_TABLET | Freq: Every day | ORAL | 0 refills | Status: DC

## 2017-10-05 MED ORDER — AMLODIPINE BESYLATE 5 MG PO TABS
5.0000 mg | ORAL_TABLET | Freq: Every day | ORAL | 0 refills | Status: DC
Start: 2017-10-05 — End: 2017-10-15

## 2017-10-05 MED ORDER — FAMOTIDINE 20 MG PO TABS: 20.0000 mg | ORAL_TABLET | Freq: Two times a day (BID) | ORAL | 0 refills | Status: DC

## 2017-10-05 NOTE — Progress Notes (Signed)
[facility] Gastroenterology Progress Note  ELANIA CROWL 77 y.o. [birth year]   Subjective: Small watery stools this morning. Denies abdominal pain. Feels ok.  Objective: Vital signs: Vitals:   10/04/17 2113 10/05/17 0459  BP: (!) 148/84 (!) 162/103  Pulse: 72 83  Resp: 16 18  Temp: 98 F (36.7 C) 97.7 F (36.5 C)  SpO2: 99% 100%    Physical Exam: Gen: alert, no acute distress  HEENT: anicteric sclera CV: RRR Chest: CTA B Abd: soft, nontender, nondistended, +BS  Lab Results: No results for input(s): NA, K, CL, CO2, GLUCOSE, BUN, CREATININE, CALCIUM, MG, PHOS in the last 72 hours. No results for input(s): AST, ALT, ALKPHOS, BILITOT, PROT, ALBUMIN in the last 72 hours. Recent Labs    10/04/17 0323  WBC 11.5*  HGB 10.1*  HCT 33.4*  MCV 84.3  PLT 351      Assessment/Plan: Ulcerative colitis flare - stable to d/c home today on Prednisone 60 mg/day and will see me in the office this week. Our office will call her Tues to arrange. Continue Morrie Sheldon and resume Apriso.   [place] C. 10/05/2017, 12:34 PM  Questions please call [phone] ID: [name], female   DOB: Aug 01, 1940, 77 y.o.   MRN: [id number]

## 2017-10-05 NOTE — Progress Notes (Signed)
Pt given discharge instructions, prescriptions, and care notes. Pt verbalized understanding AEB no further questions or concerns at this time. IV was discontinued, no redness, pain, or swelling noted at this time. Telemetry discontinued and Centralized Telemetry was notified. Pt left the floor via wheelchair with staff in stable condition. 

## 2017-10-05 NOTE — Discharge Summary (Signed)
Physician Discharge Summary  Isabella [name] [facility] FHQ:[id number] DOB: 06/22/1940 DOA: 09/29/2017  PCP: [name], MD  Admit date: 09/29/2017 Discharge date: 10/05/2017  Time spent: >35 minutes  Recommendations for Outpatient Follow-up:  F/u with PCP in 3-7 days F/u with GI in 2 weeks   Discharge Diagnoses:  Principal Problem:   Acute on chronic colitis Active Problems:   Hyperlipidemia   Obstructive sleep apnea   Essential hypertension   Lung granuloma (HCC)   DJD (degenerative joint disease)   GERD (gastroesophageal reflux disease)   Discharge Condition: stable   Diet recommendation: low sodium   Filed Weights   10/03/17 0524 10/04/17 0536 10/04/17 2113  Weight: 95.1 kg (209 lb 10.5 oz) 95.5 kg (210 lb 9.6 oz) 95.2 kg (209 lb 14.4 oz)    History of present illness:   77 year old Caucasian female with a past medical history of ulcerative colitis, arthritis, granulomatous lung disease, obstructive sleep apnea using CPAP. She presented with acute diarrhea with hematochezia. Apparently patient has been taking prednisone and was started on Xeljanz 2 weeks ago by her gastroenterologist. Apparently her c-diff was negative. She hadbeen feeling dizzy and lightheaded and so decided to come to the ED. She is thought to have exacerbation of her ulcerative colitis. She was hospitalized for further management.   Hospital Course:   Hematochezia, diarrhea. Continued to improve. Switched to po steroids as recommended by GI with suspicion for ulcerative colitis flare. Continue Anusol daily as needed. Continue Tofacitinib. C/w Pepcid 20 mg twice daily to avoid GI ulcers from high-dose steroids. GI panel negative.   Per GI: Prednisone 60 mg/day until f/u with me in 1-2 weeks. Continue Morrie Sheldon. Resume Apriso at d/c. Will discuss changing to Remicade from [place] at f/u appt. GI to cont to taper prednisone at f/u appointment in 2 weeks .  Ulcerative colitis flare, improving. GI following.  Management as stated above  Acute blood loss anemia, stable. most likely secondary to GI bleed with hematochezia. Repeat CBC is stable 10.1  Lower GI bleed,no further bleeding . Management as stated above   AKI on CKD 3, resolved. GFR 49. Creatinine on presentation 1.47. Avoid nephrotoxic agents/hypotension/dehydration  Chronic diastolic CHF. Last 2D echo done on 03/25/2011 revealed LVEF 55 to 60% with grade 1 diastolic dysfunction. chronic leg edema. Home lasix 63m. Resumed her lasix at 20 mg due to diarrhea, volume loss.  Cont outpatient f/u in 3-5 days   HTN. Started on amlodipine, resumed Losartan due to elevated BP. Holding BB due to mild bradycardia   HLD. Continue Lipitor    Procedures:  None  (i.e. Studies not automatically included, echos, thoracentesis, etc; not x-rays)  Consultations:  GI  Discharge Exam: Vitals:   10/04/17 2113 10/05/17 0459  BP: (!) 148/84 (!) 162/103  Pulse: 72 83  Resp: 16 18  Temp: 98 F (36.7 C) 97.7 F (36.5 C)  SpO2: 99% 100%    General: no distress  Cardiovascular: s1,s2 rrr Respiratory: CTA BL  Discharge Instructions  Discharge Instructions    Call MD for:  difficulty breathing, headache or visual disturbances   Complete by:  As directed    Call MD for:  persistant nausea and vomiting   Complete by:  As directed    Call MD for:  severe uncontrolled pain   Complete by:  As directed    Call MD for:  temperature >100.4   Complete by:  As directed    Diet - low sodium heart healthy   Complete by:  As directed    Increase activity slowly   Complete by:  As directed      Allergies as of 10/05/2017      Reactions   Imodium A-d [loperamide Hcl] Shortness Of Breath, Other (See Comments)   Caused a pain & "squeezing" sensation in the lungs also   Penicillins Hives, Shortness Of Breath   Reaction at 77 years old Has patient had a PCN reaction causing immediate rash, facial/tongue/throat swelling, SOB or lightheadedness  with hypotension: Yes Has patient had a PCN reaction causing severe rash involving mucus membranes or skin necrosis: No Has patient had a PCN reaction that required hospitalization No Has patient had a PCN reaction occurring within the last 10 years: No If all of the above answers are "NO", then may proceed with Cephalosporin use.   Tramadol Shortness Of Breath, Other (See Comments)   Per patient "felt like lungs were being squeezed"   Zestril [lisinopril] Cough   Hornet Venom Swelling   Neomycin Swelling, Other (See Comments)   Reaction to eye ointment - eyes became swollen shut Makes wounds ooze   Adhesive [tape] Rash   Codeine Other (See Comments)   Made back hurt   Erythromycin Diarrhea   Hydromorphone Other (See Comments)   Severe Lethargy    Percocet [oxycodone-acetaminophen] Other (See Comments)   Confusion      Medication List    STOP taking these medications   aspirin 81 MG chewable tablet   metoprolol tartrate 25 MG tablet Commonly known as:  LOPRESSOR     TAKE these medications   acetaminophen 500 MG tablet Commonly known as:  TYLENOL Take 500 mg by mouth every 6 (six) hours as needed for mild pain.   amLODipine 5 MG tablet Commonly known as:  NORVASC Take 1 tablet (5 mg total) by mouth daily.   APRISO 0.375 g 24 hr capsule Generic drug:  mesalamine Take 1.5 mg by mouth every morning.   CENTRUM SILVER 50+WOMEN Tabs Take 1 tablet by mouth daily with lunch.   chlorpheniramine 4 MG tablet Commonly known as:  CHLOR-TRIMETON Take 8 mg by mouth at bedtime.   famotidine 20 MG tablet Commonly known as:  PEPCID Take 1 tablet (20 mg total) by mouth 2 (two) times daily.   furosemide 40 MG tablet Commonly known as:  LASIX Take 0.5 tablets (20 mg total) by mouth daily. What changed:  how much to take   hydrocortisone 100 MG/60ML enema Commonly known as:  CORTENEMA Place 1 enema rectally daily as needed (constipation).   hydrocortisone 2.5 % cream Apply 1  application topically daily as needed (for hemorrhoidal pain.). 1 application rectally as needed for hemorrhoidal pain   LIPITOR 40 MG tablet Generic drug:  atorvastatin Take 40 mg by mouth at bedtime.   losartan 50 MG tablet Commonly known as:  COZAAR Take 50 mg by mouth daily.   nitroGLYCERIN 0.4 MG SL tablet Commonly known as:  NITROSTAT Place 0.4 mg under the tongue every 5 (five) minutes as needed for chest pain.   nystatin 100000 UNIT/ML suspension Commonly known as:  MYCOSTATIN Take 5 mLs (500,000 Units total) by mouth 4 (four) times daily.   prednisoLONE acetate 1 % ophthalmic suspension Commonly known as:  PRED FORTE Place 1 drop into the left eye 2 (two) times daily.   predniSONE 20 MG tablet Commonly known as:  DELTASONE Take 3 tablets (60 mg total) by mouth daily with breakfast. What changed:    medication strength  how  much to take   UNABLE TO FIND CPAP: At bedtime nightly and during all naps   XELJANZ 10 MG Tabs Generic drug:  Tofacitinib Citrate Take 10 mg by mouth 2 (two) times daily.      Allergies  Allergen Reactions  . Imodium A-D [Loperamide Hcl] Shortness Of Breath and Other (See Comments)    Caused a pain & "squeezing" sensation in the lungs also  . Penicillins Hives and Shortness Of Breath    Reaction at 77 years old Has patient had a PCN reaction causing immediate rash, facial/tongue/throat swelling, SOB or lightheadedness with hypotension: Yes Has patient had a PCN reaction causing severe rash involving mucus membranes or skin necrosis: No Has patient had a PCN reaction that required hospitalization No Has patient had a PCN reaction occurring within the last 10 years: No If all of the above answers are "NO", then may proceed with Cephalosporin use.  . Tramadol Shortness Of Breath and Other (See Comments)    Per patient "felt like lungs were being squeezed"  . Zestril [Lisinopril] Cough  . Hornet Venom Swelling  . Neomycin Swelling and  Other (See Comments)    Reaction to eye ointment - eyes became swollen shut Makes wounds ooze  . Adhesive [Tape] Rash  . Codeine Other (See Comments)    Made back hurt  . Erythromycin Diarrhea  . Hydromorphone Other (See Comments)    Severe Lethargy   . Percocet [Oxycodone-Acetaminophen] Other (See Comments)    Confusion       The results of significant diagnostics from this hospitalization (including imaging, microbiology, ancillary and laboratory) are listed below for reference.    Significant Diagnostic Studies: Ct Biopsy  Result Date: 09/23/2017 INDICATION: No known primary, now with indeterminate minimally enlarging hypermetabolic right lower lobe pulmonary nodule. Please perform CT-guided biopsy for tissue diagnostic purposes. EXAM: CT GUIDED BIOPSY RIGHT LOWER LOBE PULMONARY NODULE. COMPARISON:  Chest CT - 07/31/2017; 01/29/2017; 09/12/2016; 03/19/2016; 12/21/2015; PET-CT-02/08/2026 MEDICATIONS: None. ANESTHESIA/SEDATION: Fentanyl 25 mcg IV; Versed 0.5 mg IV Sedation time: 21 minutes; The patient was continuously monitored during the procedure by the interventional radiology nurse under my direct supervision. CONTRAST:  None COMPLICATIONS: SIR Level A - No therapy, no consequence. Procedure complicated by development self-limited and asymptomatic hemoptysis. PROCEDURE: Informed consent was obtained from the patient following an explanation of the procedure, risks, benefits and alternatives. The patient understands,agrees and consents for the procedure. All questions were addressed. A time out was performed prior to the initiation of the procedure. The patient was positioned right lateral decubitus on the CT table and a limited chest CT was performed for procedural planning demonstrating unchanged size and appearance of the approximately 1.6 x 1.5 cm nodule within the medial basilar segment of the right lower lobe (image 53, series 2). The operative site was prepped and draped in the usual  sterile fashion. Under sterile conditions and local anesthesia, a 17 gauge coaxial needle was advanced into the peripheral aspect of the nodule. Positioning was confirmed with intermittent CT fluoroscopy and followed by the acquisition of 3 core needle biopsy samples with an 18 gauge core needle biopsy device. The coaxial needle was removed following deployment of a Bios[employer] plug and superficial hemostasis was achieved with manual compression. Limited post procedural chest CT was negative for pneumothorax or additional complication. Patient experienced an episode of self-limited and asymptomatic hemoptysis, however additional delayed imaging was negative for development of a sizable peri biopsy hemorrhage with remain patency of the right lower lobe bronchi.  A dressing was placed. The patient tolerated the procedure well without immediate postprocedural complication. The patient was escorted to have an upright chest radiograph. IMPRESSION: Technically successful CT guided core needle core biopsy of indeterminate hypermetabolic right lower lobe pulmonary nodule. Procedure complicated by development self-limited asymptomatic hemoptysis, not requiring dedicated intervention. Electronically Signed   By: Sandi Mariscal M.D.   On: 09/15/2017 17:14   Dg Chest Port 1 View  Result Date: 09/15/2017 CLINICAL DATA:  Status post CT biopsy of a right lower lobe pulmonary nodule or mass. EXAM: PORTABLE CHEST 1 VIEW COMPARISON:  CT scan of the chest of today's date and chest x-ray of April 08, 2016 FINDINGS: The right hemidiaphragm is mildly elevated. There is no pneumothorax or pleural effusion. The left lung is clear. The heart is top-normal in size. The pulmonary vascularity is normal. The bony thorax is unremarkable. IMPRESSION: No immediate postprocedure complication following CT directed right lung biopsy. Electronically Signed   By: David  Martinique M.D.   On: 09/15/2017 13:35   Dg Abd Acute W/chest  Result Date:  09/30/2017 CLINICAL DATA:  Abdominal pain, ulcerative colitis. EXAM: DG ABDOMEN ACUTE W/ 1V CHEST COMPARISON:  09/15/2017. FINDINGS: Cardiac enlargement.  Tortuous aorta.  No consolidation or edema. No visible obstruction or free air. Mesh abdominal wall hernia repair. Previous lumbar fusion. No osseous findings. IMPRESSION: Negative abdominal radiographs. No acute cardiopulmonary disease. Cardiomegaly. Electronically Signed   By: Staci Righter M.D.   On: 09/30/2017 11:19    Microbiology: Recent Results (from the past 240 hour(s))  Gastrointestinal Panel by PCR , Stool     Status: None   Collection Time: 09/30/17  2:46 PM  Result Value Ref Range Status   Campylobacter species NOT DETECTED NOT DETECTED Final   Plesimonas shigelloides NOT DETECTED NOT DETECTED Final   Salmonella species NOT DETECTED NOT DETECTED Final   Yersinia enterocolitica NOT DETECTED NOT DETECTED Final   Vibrio species NOT DETECTED NOT DETECTED Final   Vibrio cholerae NOT DETECTED NOT DETECTED Final   Enteroaggregative E coli (EAEC) NOT DETECTED NOT DETECTED Final   Enteropathogenic E coli (EPEC) NOT DETECTED NOT DETECTED Final   Enterotoxigenic E coli (ETEC) NOT DETECTED NOT DETECTED Final   Shiga like toxin producing E coli (STEC) NOT DETECTED NOT DETECTED Final   Shigella/Enteroinvasive E coli (EIEC) NOT DETECTED NOT DETECTED Final   Cryptosporidium NOT DETECTED NOT DETECTED Final   Cyclospora cayetanensis NOT DETECTED NOT DETECTED Final   Entamoeba histolytica NOT DETECTED NOT DETECTED Final   Giardia lamblia NOT DETECTED NOT DETECTED Final   Adenovirus F40/41 NOT DETECTED NOT DETECTED Final   Astrovirus NOT DETECTED NOT DETECTED Final   Norovirus GI/GII NOT DETECTED NOT DETECTED Final   Rotavirus A NOT DETECTED NOT DETECTED Final   Sapovirus (I, II, IV, and V) NOT DETECTED NOT DETECTED Final    Comment: Performed at [facility], [address] [place], [place] [address]     Labs: Basic Metabolic  Panel: Recent Labs  Lab 09/29/17 1816 09/29/17 2250 09/30/17 0356 09/30/17 0933 10/01/17 0523 10/02/17 0342  NA 137 137 138 138 141 141  K 3.1* 4.1 3.7 3.7 3.8 3.9  CL 100* 101 103 103 105 108  CO2 27 29 28 26 28 26   GLUCOSE 112* 170* 130* 145* 126* 149*  BUN 11 10 9 12 13 15   CREATININE 1.34* 1.30* 1.18* 1.11* 1.09* 1.09*  CALCIUM 7.6* 7.6* 7.5* 7.8* 8.2* 8.2*  MG 1.8  --  2.0  --   --   --  Liver Function Tests: No results for input(s): AST, ALT, ALKPHOS, BILITOT, PROT, ALBUMIN in the last 168 hours. Recent Labs  Lab 09/29/17 1144  LIPASE 44   No results for input(s): AMMONIA in the last 168 hours. CBC: Recent Labs  Lab 09/30/17 0356 09/30/17 0944 10/01/17 0523 10/02/17 0342 10/04/17 0323  WBC 7.4 8.0 8.0 8.8 11.5*  HGB 9.1* 9.9* 9.8* 9.7* 10.1*  HCT 29.3* 32.9* 32.3* 32.3* 33.4*  MCV 84.0 84.8 83.9 84.3 84.3  PLT 365 408* 435* 441* 351   Cardiac Enzymes: No results for input(s): CKTOTAL, CKMB, CKMBINDEX, TROPONINI in the last 168 hours. BNP: BNP (last 3 results) No results for input(s): BNP in the last 8760 hours.  ProBNP (last 3 results) No results for input(s): PROBNP in the last 8760 hours.  CBG: Recent Labs  Lab 09/29/17 1504  GLUCAP 106*       Signed:  Marine Lezotte N  [facility] 10/05/2017, 9:31 AM

## 2017-10-14 ENCOUNTER — Other Ambulatory Visit: Payer: Self-pay | Admitting: *Deleted

## 2017-10-14 DIAGNOSIS — I25118 Atherosclerotic heart disease of native coronary artery with other forms of angina pectoris: Secondary | ICD-10-CM

## 2017-10-14 DIAGNOSIS — R600 Localized edema: Secondary | ICD-10-CM

## 2017-10-14 DIAGNOSIS — I10 Essential (primary) hypertension: Secondary | ICD-10-CM

## 2017-10-14 NOTE — Progress Notes (Signed)
Per Dr. Meda Coffee- pt needs ASAP appt with APP and would like for pt to have an echo.  Spoke with pt and scheduled her to see Ermalinda Barrios, PA-C 6/5 at noon.  Echo ordered.  Pt appreciative for call.

## 2017-10-15 ENCOUNTER — Ambulatory Visit (INDEPENDENT_AMBULATORY_CARE_PROVIDER_SITE_OTHER): Payer: Medicare Other | Admitting: Physician Assistant

## 2017-10-15 ENCOUNTER — Other Ambulatory Visit: Payer: Self-pay

## 2017-10-15 ENCOUNTER — Encounter: Payer: Self-pay | Admitting: Physician Assistant

## 2017-10-15 ENCOUNTER — Ambulatory Visit (HOSPITAL_COMMUNITY): Payer: Medicare Other | Attending: Cardiovascular Disease

## 2017-10-15 ENCOUNTER — Encounter (INDEPENDENT_AMBULATORY_CARE_PROVIDER_SITE_OTHER): Payer: Self-pay

## 2017-10-15 VITALS — BP 130/82 | HR 76 | Ht 61.0 in | Wt 204.0 lb

## 2017-10-15 DIAGNOSIS — I10 Essential (primary) hypertension: Secondary | ICD-10-CM | POA: Diagnosis not present

## 2017-10-15 DIAGNOSIS — E785 Hyperlipidemia, unspecified: Secondary | ICD-10-CM | POA: Insufficient documentation

## 2017-10-15 DIAGNOSIS — I252 Old myocardial infarction: Secondary | ICD-10-CM | POA: Insufficient documentation

## 2017-10-15 DIAGNOSIS — I5031 Acute diastolic (congestive) heart failure: Secondary | ICD-10-CM | POA: Diagnosis not present

## 2017-10-15 DIAGNOSIS — I2510 Atherosclerotic heart disease of native coronary artery without angina pectoris: Secondary | ICD-10-CM | POA: Diagnosis not present

## 2017-10-15 DIAGNOSIS — R600 Localized edema: Secondary | ICD-10-CM

## 2017-10-15 DIAGNOSIS — E782 Mixed hyperlipidemia: Secondary | ICD-10-CM | POA: Diagnosis not present

## 2017-10-15 DIAGNOSIS — R0600 Dyspnea, unspecified: Secondary | ICD-10-CM | POA: Insufficient documentation

## 2017-10-15 DIAGNOSIS — I25118 Atherosclerotic heart disease of native coronary artery with other forms of angina pectoris: Secondary | ICD-10-CM

## 2017-10-15 LAB — ECHOCARDIOGRAM COMPLETE
Height: 61 in
Weight: 3264 oz

## 2017-10-15 NOTE — Patient Instructions (Addendum)
Medication Instructions:  Your physician has recommended you make the following change in your medication:  Increase furosemide to 80 mg daily for 3 days and increase potassium to 40 meq daily for 3 days.  After this go back to normal dose.    Labwork: none  Testing/Procedures: none  Follow-Up: Your physician recommends that you schedule a follow-up appointment in: 2-3 weeks with NP or PA.     Any Other Special Instructions Will Be Listed Below (If Applicable).     If you need a refill on your cardiac medications before your next appointment, please call your pharmacy.   Low-Sodium Eating Plan Sodium, which is an element that makes up salt, helps you maintain a healthy balance of fluids in your body. Too much sodium can increase your blood pressure and cause fluid and waste to be held in your body. Your health care provider or dietitian may recommend following this plan if you have high blood pressure (hypertension), kidney disease, liver disease, or heart failure. Eating less sodium can help lower your blood pressure, reduce swelling, and protect your heart, liver, and kidneys. What are tips for following this plan? General guidelines  Most people on this plan should limit their sodium intake to 1,500-2,000 mg (milligrams) of sodium each day. Reading food labels  The Nutrition Facts label lists the amount of sodium in one serving of the food. If you eat more than one serving, you must multiply the listed amount of sodium by the number of servings.  Choose foods with less than 140 mg of sodium per serving.  Avoid foods with 300 mg of sodium or more per serving. Shopping  Look for lower-sodium products, often labeled as "low-sodium" or "no salt added."  Always check the sodium content even if foods are labeled as "unsalted" or "no salt added".  Buy fresh foods. ? Avoid canned foods and premade or frozen meals. ? Avoid canned, cured, or processed meats  Buy breads that  have less than 80 mg of sodium per slice. Cooking  Eat more home-cooked food and less restaurant, buffet, and fast food.  Avoid adding salt when cooking. Use salt-free seasonings or herbs instead of table salt or sea salt. Check with your health care provider or pharmacist before using salt substitutes.  Cook with plant-based oils, such as canola, sunflower, or olive oil. Meal planning  When eating at a restaurant, ask that your food be prepared with less salt or no salt, if possible.  Avoid foods that contain MSG (monosodium glutamate). MSG is sometimes added to [place] food, bouillon, and some canned foods. What foods are recommended? The items listed may not be a complete list. Talk with your dietitian about what dietary choices are best for you. Grains Low-sodium cereals, including oats, puffed wheat and rice, and shredded wheat. Low-sodium crackers. Unsalted rice. Unsalted pasta. Low-sodium bread. Whole-grain breads and whole-grain pasta. Vegetables Fresh or frozen vegetables. "No salt added" canned vegetables. "No salt added" tomato sauce and paste. Low-sodium or reduced-sodium tomato and vegetable juice. Fruits Fresh, frozen, or canned fruit. Fruit juice. Meats and other protein foods Fresh or frozen (no salt added) meat, poultry, seafood, and fish. Low-sodium canned tuna and salmon. Unsalted nuts. Dried peas, beans, and lentils without added salt. Unsalted canned beans. Eggs. Unsalted nut butters. Dairy Milk. Soy milk. Cheese that is naturally low in sodium, such as ricotta cheese, fresh mozzarella, or Swiss cheese Low-sodium or reduced-sodium cheese. Cream cheese. Yogurt. Fats and oils Unsalted butter. Unsalted margarine with no  trans fat. Vegetable oils such as canola or olive oils. Seasonings and other foods Fresh and dried herbs and spices. Salt-free seasonings. Low-sodium mustard and ketchup. Sodium-free salad dressing. Sodium-free light mayonnaise. Fresh or refrigerated  horseradish. Lemon juice. Vinegar. Homemade, reduced-sodium, or low-sodium soups. Unsalted popcorn and pretzels. Low-salt or salt-free chips. What foods are not recommended? The items listed may not be a complete list. Talk with your dietitian about what dietary choices are best for you. Grains Instant hot cereals. Bread stuffing, pancake, and biscuit mixes. Croutons. Seasoned rice or pasta mixes. Noodle soup cups. Boxed or frozen macaroni and cheese. Regular salted crackers. Self-rising flour. Vegetables Sauerkraut, pickled vegetables, and relishes. Olives. [place] fries. Onion rings. Regular canned vegetables (not low-sodium or reduced-sodium). Regular canned tomato sauce and paste (not low-sodium or reduced-sodium). Regular tomato and vegetable juice (not low-sodium or reduced-sodium). Frozen vegetables in sauces. Meats and other protein foods Meat or fish that is salted, canned, smoked, spiced, or pickled. Bacon, ham, sausage, hotdogs, corned beef, chipped beef, packaged lunch meats, salt pork, jerky, pickled herring, anchovies, regular canned tuna, sardines, salted nuts. Dairy Processed cheese and cheese spreads. Cheese curds. Blue cheese. Feta cheese. String cheese. Regular cottage cheese. Buttermilk. Canned milk. Fats and oils Salted butter. Regular margarine. Ghee. Bacon fat. Seasonings and other foods Onion salt, garlic salt, seasoned salt, table salt, and sea salt. Canned and packaged gravies. Worcestershire sauce. Tartar sauce. Barbecue sauce. Teriyaki sauce. Soy sauce, including reduced-sodium. Steak sauce. Fish sauce. Oyster sauce. Cocktail sauce. Horseradish that you find on the shelf. Regular ketchup and mustard. Meat flavorings and tenderizers. Bouillon cubes. Hot sauce and Tabasco sauce. Premade or packaged marinades. Premade or packaged taco seasonings. Relishes. Regular salad dressings. Salsa. Potato and tortilla chips. Corn chips and puffs. Salted popcorn and pretzels. Canned or  dried soups. Pizza. Frozen entrees and pot pies. Summary  Eating less sodium can help lower your blood pressure, reduce swelling, and protect your heart, liver, and kidneys.  Most people on this plan should limit their sodium intake to 1,500-2,000 mg (milligrams) of sodium each day.  Canned, boxed, and frozen foods are high in sodium. Restaurant foods, fast foods, and pizza are also very high in sodium. You also get sodium by adding salt to food.  Try to cook at home, eat more fresh fruits and vegetables, and eat less fast food, canned, processed, or prepared foods. This information is not intended to replace advice given to you by your health care provider. Make sure you discuss any questions you have with your health care provider. Document Released: 10/19/2001 Document Revised: 04/22/2016 Document Reviewed: 04/22/2016 Elsevier Interactive Patient Education  [employer].

## 2017-10-15 NOTE — Progress Notes (Signed)
Cardiology Office Note    Date:  10/15/2017   ID:  Isabella [name], Isabella [name] January 24, 1941, MRN [id number]  PCP:  [name], MD  Cardiologist: Sinclair Grooms, MD  Chief Complaint  Patient presents with  . Leg Swelling    History of Present Illness:  Isabella [name] is a 77 y.o. female with history of STEMI in 2012 treated with DES to the RCA x2 proximal for catheter induced dissection distal to treat acute lesion.  Also has hypertension and HLD and history of DVT.  Saw Dr. [name] 06/12/2017 at which time she had been dealing with complications from ulcerative colitis.  Was not having any angina.  Was having right lower extremity swelling.  Lower extremity Doppler showed acute superficial thrombosis of the small saphenous vein but no deep vein thrombosis.  Patient was discharged from the hospital 10/05/2017 after an ulcerative colitis flare.  Like 6 was held in the hospital because of diarrhea and volume loss.  She was also started on amlodipine due to elevated blood pressures resume losartan and stopped beta-blocker.  Last echo 2012 normal LV function EF 55 to 60% with grade 1 DD.  She comes in today accompanied by her daughter.  Amlodipine was stopped by Dr. [name] on Monday and metoprolol restarted.  She is had some improvement in the edema already.  She is still on high-dose prednisone which could be contributing.  She has dyspnea on exertion.  Legs will be is much more swollen by the end of the day she states.  Does her own cooking but has not really watched her salt in the past.  Past Medical History:  Diagnosis Date  . Arthritis   . Complication of anesthesia    Took a while to wake up with knee  . Coronary artery disease   . Dyspnea    with walking at times  . Fallen bladder   . GERD (gastroesophageal reflux disease)   . 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 Headache    migraines in her younger years  .  Hemorrhoids   . High cholesterol   . History of blood transfusion   . Hx of adenomatous colonic polyps   . Hypertension   . Lung nodule   . Myocardial infarct ([place]) 03/24/2011   Dr. [name]  . Nocturnal leg cramps   . Obesity   . Sarcoidosis   . Sleep apnea    uses cpap    Past Surgical History:  Procedure Laterality Date  . ABDOMINAL HYSTERECTOMY  1990  . APPENDECTOMY  1970  . BACK SURGERY  2015   lower back  . CARDIAC CATHETERIZATION  03/2011  . CARPAL TUNNEL RELEASE Bilateral 2004  . CATARACT EXTRACTION    . CHOLECYSTECTOMY  1970  . COLONOSCOPY W/ BIOPSIES AND POLYPECTOMY    . CORONARY ANGIOPLASTY  2012  . CORONARY STENT PLACEMENT  03/24/2011   Dr. [name]  . EYE SURGERY  2008,2011   corneal transplant  . FEMORAL ARTERY EXPLORATION  03/25/2011   Procedure: FEMORAL ARTERY EXPLORATION;  Surgeon: Elam Dutch, MD;  Location: [place];  Service: Vascular;  Laterality: Right;  Evacuation of Hematoma   . hematoma surgery     from cardiac cath -hematoma in groin-right  . HERNIA REPAIR  [id number]   umbilical  . JOINT REPLACEMENT    . KNEE ARTHROSCOPY  2006   Right  .  [place] Surgery Left Eye  2012  . LEFT HEART CATHETERIZATION WITH CORONARY ANGIOGRAM N/A 03/24/2011   Procedure: LEFT HEART CATHETERIZATION WITH CORONARY ANGIOGRAM;  Surgeon: Sinclair Grooms, MD;  Location[facility] CATH LAB;  Service: Cardiovascular;  Laterality: N/A;  . PERCUTANEOUS CORONARY STENT INTERVENTION (PCI-S) N/A 03/24/2011   Procedure: PERCUTANEOUS CORONARY STENT INTERVENTION (PCI-S);  Surgeon: Sinclair Grooms, MD;  Location: [facility] CATH LAB;  Service: Cardiovascular;  Laterality: N/A;  . SHOULDER SURGERY  2010  . TONSILLECTOMY  1949  . TONSILLECTOMY AND ADENOIDECTOMY    . TOTAL KNEE ARTHROPLASTY  07/2010   Left  . TOTAL KNEE ARTHROPLASTY  06/17/2012   Procedure: TOTAL KNEE ARTHROPLASTY;  Surgeon: Gearlean Alf, MD;  Location: [facility] ORS;  Service: Orthopedics;  Laterality: Right;  . TUBAL LIGATION  1978  .  [place], 2007   x 2  . VIDEO BRONCHOSCOPY WITH ENDOBRONCHIAL NAVIGATION  04/13/2012   Procedure: VIDEO BRONCHOSCOPY WITH ENDOBRONCHIAL NAVIGATION;  Surgeon: Melrose Nakayama, MD;  Location: [place];  Service: Thoracic;  Laterality: N/A;  . VIDEO BRONCHOSCOPY WITH ENDOBRONCHIAL NAVIGATION N/A 04/08/2016   Procedure: VIDEO BRONCHOSCOPY WITH ENDOBRONCHIAL NAVIGATION;  Surgeon: Rigoberto Noel, MD;  Location: [facility] OR;  Service: Thoracic;  Laterality: N/A;    Current Medications: Current Meds  Medication Sig  . acetaminophen (TYLENOL) 500 MG tablet Take 500 mg by mouth every 6 (six) hours as needed for mild pain.  Marland Kitchen atorvastatin (LIPITOR) 40 MG tablet Take 40 mg by mouth at bedtime.  . chlorpheniramine (CHLOR-TRIMETON) 4 MG tablet Take 8 mg by mouth at bedtime.   . famotidine (PEPCID) 20 MG tablet Take 1 tablet (20 mg total) by mouth 2 (two) times daily.  . furosemide (LASIX) 40 MG tablet Take 40 mg by mouth.  . hydrocortisone 2.5 % cream Apply 1 application topically daily as needed (for hemorrhoidal pain.). 1 application rectally as needed for hemorrhoidal pain  . losartan (COZAAR) 50 MG tablet Take 50 mg by mouth daily.   . metoprolol tartrate (LOPRESSOR) 25 MG tablet Take 25 mg by mouth 2 (two) times daily.  . Multiple Vitamins-Minerals (CENTRUM SILVER 50+WOMEN) TABS Take 1 tablet by mouth daily with lunch.   . nitroGLYCERIN (NITROSTAT) 0.4 MG SL tablet Place 0.4 mg under the tongue every 5 (five) minutes as needed for chest pain.  Marland Kitchen nystatin (MYCOSTATIN) 100000 UNIT/ML suspension Take 5 mLs (500,000 Units total) by mouth 4 (four) times daily.  . potassium chloride SA (K-DUR,KLOR-CON) 20 MEQ tablet Take 20 mEq by mouth daily.  . prednisoLONE acetate (PRED FORTE) 1 % ophthalmic suspension Place 1 drop into the left eye 2 (two) times daily.   . predniSONE (DELTASONE) 20 MG tablet Take 3 tablets (60 mg total) by mouth daily with breakfast.  . Tofacitinib Citrate (XELJANZ) 10  MG TABS Take 10 mg by mouth 2 (two) times daily.  Marland Kitchen UNABLE TO FIND CPAP: At bedtime nightly and during all naps     Allergies:   Imodium a-d [loperamide hcl]; Penicillins; Tramadol; Zestril [lisinopril]; Hornet venom; Neomycin; Adhesive [tape]; Codeine; Erythromycin; Hydromorphone; and Percocet [oxycodone-acetaminophen]   Social History   Socioeconomic History  . Marital status: Widowed    Spouse name: Not on file  . Number of children: Not on file  . Years of education: Not on file  . Highest education level: Not on file  Occupational History  . Occupation: Retired    Fish farm manager: RETIRED  Social Needs  . Financial  resource strain: Not on file  . Food insecurity:    Worry: Not on file    Inability: Not on file  . Transportation needs:    Medical: Not on file    Non-medical: Not on file  Tobacco Use  . Smoking status: Never Smoker  . Smokeless tobacco: Never Used  Substance and Sexual Activity  . Alcohol use: No  . Drug use: No  . Sexual activity: Not on file  Lifestyle  . Physical activity:    Days per week: Not on file    Minutes per session: Not on file  . Stress: Not on file  Relationships  . Social connections:    Talks on phone: Not on file    Gets together: Not on file    Attends religious service: Not on file    Active member of club or organization: Not on file    Attends meetings of clubs or organizations: Not on file    Relationship status: Not on file  Other Topics Concern  . Not on file  Social History Narrative   Tobacco use cigarettes: never smoked, tobacco history last updated 12/23/2013. No smoking. No alcohol. Caffeine: yes, coffee, 3 servings daily.recreational drug use: never. No diet. Exercise: walks everyday. Occupation: retired. Marital status: married. Children: boys, 2 girls, 2. Seat belt use: yes.     Family History:  The patient's family history includes Breast cancer in her maternal grandmother; Cancer in her father; Hypertension in her  mother.   ROS:   Please see the history of present illness.    Review of Systems  Constitution: Negative.  HENT: Negative.   Cardiovascular: Positive for dyspnea on exertion and leg swelling.  Respiratory: Negative.   Endocrine: Negative.   Hematologic/Lymphatic: Negative.   Musculoskeletal: Negative.   Gastrointestinal: Negative.   Genitourinary: Negative.   Neurological: Negative.    All other systems reviewed and are negative.   PHYSICAL EXAM:   VS:  BP 130/82   Pulse 76   Ht 5' 1"  (1.549 m)   Wt 204 lb (92.5 kg)   SpO2 99%   BMI 38.55 kg/m   Physical Exam  GEN: Obese, in no acute distress  Neck: no JVD, carotid bruits, or masses Cardiac:RRR; no murmurs, rubs, or gallops  Respiratory:  clear to auscultation bilaterally, normal work of breathing GI: soft, nontender, nondistended, + BS Ext: 3-4 edema up to her knees bilaterally otherwise without cyanosis, clubbing, Good distal pulses bilaterally Neuro:  Alert and Oriented x 3, Strength and sensation are intact Psych: euthymic mood, full affect  Wt Readings from Last 3 Encounters:  10/15/17 204 lb (92.5 kg)  10/04/17 209 lb 14.4 oz (95.2 kg)  09/23/17 211 lb (95.7 kg)      Studies/Labs Reviewed:   EKG:  EKG is not ordered today.  Recent Labs: 09/30/2017: Magnesium 2.0 10/02/2017: BUN 15; Creatinine, Ser 1.09; Potassium 3.9; Sodium 141 10/04/2017: Hemoglobin 10.1; Platelets 351   Lipid Panel    Component Value Date/Time   CHOL 179 03/24/2011 1742   TRIG 105 03/24/2011 1742   HDL 73 03/24/2011 1742   CHOLHDL 2.5 03/24/2011 1742   VLDL 21 03/24/2011 1742   LDLCALC 85 03/24/2011 1742    Additional studies/ records that were reviewed today include:  2D echo 2012Study Conclusions  Left ventricle: The cavity size was normal. There was mild concentric hypertrophy. Systolic function was normal. The estimated ejection fraction was in the range of 55% to 60%. Wall motion was normal; there  were no regional wall  motion abnormalities. There was an increased relative contribution of atrial contraction to ventricular filling. Doppler parameters are consistent with abnormal left ventricular relaxation (grade 1 diastolic dysfunction). Transthoracic echocardiography.  M-mode, complete 2D, spectral Doppler, and color Doppler.  Height:  Height: 157.5cm. Height: 62in.  Weight:  Weight: 100.4kg. Weight: 220.9lb.  Body mass index:  BMI: 40.5kg/m^2.  Body surface area:    BSA: 2.8m2.  Blood pressure:     101/32.  Patient status:  Inpatient.  Location:  ICU/CCU     ASSESSMENT:    1. Acute diastolic CHF (congestive heart failure) ([place]   2. Atherosclerosis of native coronary artery of native heart without angina pectoris   3. Essential hypertension   4. Mixed hyperlipidemia      PLAN:  In order of problems listed above:  Diastolic CHF with significant lower extremity edema multifactorial suspect secondary to steroids, amlodipine, and stopping Lasix when in the hospital for ulcerative colitis.  She has significant edema today.  Will increase Lasix to 80 mg daily for 3 days increase potassium to 40 mEq for 3 days then back to 40 mg of Lasix daily and potassium 20 mEq daily.  Patient had blood work by PCP on Monday and was told her potassium was low and this was treated.  She will need to follow-up with u[facility]ain 2 weeks and have repeat labs that day.  We will try to obtain labs from Dr. GInda Merlin  Scheduled for 2D echo today to assess LV function and diastolic function.  2 g sodium diet follow-up with Dr. STamala Julianin 2 months.  CAD STEMI in 2012 treated with DES to the RCA x2 proximal for catheter induced dissection distal to treat acute lesion.  Hypertension well controlled  Mixed hyperlipidemia on Lipitor  Medication Adjustments/Labs and Tests Ordered: Current medicines are reviewed at length with the patient today.  Concerns regarding medicines are outlined above.  Medication changes, Labs and Tests ordered  today are listed in the Patient Instructions below. Patient Instructions  Medication Instructions:  Your physician has recommended you make the following change in your medication:  Increase furosemide to 80 mg daily for 3 days and increase potassium to 40 meq daily for 3 days.  After this go back to normal dose.    Labwork: none  Testing/Procedures: none  Follow-Up: Your physician recommends that you schedule a follow-up appointment in: 2-3 weeks with NP or PA.     Any Other Special Instructions Will Be Listed Below (If Applicable).     If you need a refill on your cardiac medications before your next appointment, please call your pharmacy.   Low-Sodium Eating Plan Sodium, which is an element that makes up salt, helps you maintain a healthy balance of fluids in your body. Too much sodium can increase your blood pressure and cause fluid and waste to be held in your body. Your health care provider or dietitian may recommend following this plan if you have high blood pressure (hypertension), kidney disease, liver disease, or heart failure. Eating less sodium can help lower your blood pressure, reduce swelling, and protect your heart, liver, and kidneys. What are tips for following this plan? General guidelines  Most people on this plan should limit their sodium intake to 1,500-2,000 mg (milligrams) of sodium each day. Reading food labels  The Nutrition Facts label lists the amount of sodium in one serving of the food. If you eat more than one serving, you must multiply the listed  amount of sodium by the number of servings.  Choose foods with less than 140 mg of sodium per serving.  Avoid foods with 300 mg of sodium or more per serving. Shopping  Look for lower-sodium products, often labeled as "low-sodium" or "no salt added."  Always check the sodium content even if foods are labeled as "unsalted" or "no salt added".  Buy fresh foods. ? Avoid canned foods and premade or  frozen meals. ? Avoid canned, cured, or processed meats  Buy breads that have less than 80 mg of sodium per slice. Cooking  Eat more home-cooked food and less restaurant, buffet, and fast food.  Avoid adding salt when cooking. Use salt-free seasonings or herbs instead of table salt or sea salt. Check with your health care provider or pharmacist before using salt substitutes.  Cook with plant-based oils, such as canola, sunflower, or olive oil. Meal planning  When eating at a restaurant, ask that your food be prepared with less salt or no salt, if possible.  Avoid foods that contain MSG (monosodium glutamate). MSG is sometimes added to [place] food, bouillon, and some canned foods. What foods are recommended? The items listed may not be a complete list. Talk with your dietitian about what dietary choices are best for you. Grains Low-sodium cereals, including oats, puffed wheat and rice, and shredded wheat. Low-sodium crackers. Unsalted rice. Unsalted pasta. Low-sodium bread. Whole-grain breads and whole-grain pasta. Vegetables Fresh or frozen vegetables. "No salt added" canned vegetables. "No salt added" tomato sauce and paste. Low-sodium or reduced-sodium tomato and vegetable juice. Fruits Fresh, frozen, or canned fruit. Fruit juice. Meats and other protein foods Fresh or frozen (no salt added) meat, poultry, seafood, and fish. Low-sodium canned tuna and salmon. Unsalted nuts. Dried peas, beans, and lentils without added salt. Unsalted canned beans. Eggs. Unsalted nut butters. Dairy Milk. Soy milk. Cheese that is naturally low in sodium, such as ricotta cheese, fresh mozzarella, or Swiss cheese Low-sodium or reduced-sodium cheese. Cream cheese. Yogurt. Fats and oils Unsalted butter. Unsalted margarine with no trans fat. Vegetable oils such as canola or olive oils. Seasonings and other foods Fresh and dried herbs and spices. Salt-free seasonings. Low-sodium mustard and ketchup.  Sodium-free salad dressing. Sodium-free light mayonnaise. Fresh or refrigerated horseradish. Lemon juice. Vinegar. Homemade, reduced-sodium, or low-sodium soups. Unsalted popcorn and pretzels. Low-salt or salt-free chips. What foods are not recommended? The items listed may not be a complete list. Talk with your dietitian about what dietary choices are best for you. Grains Instant hot cereals. Bread stuffing, pancake, and biscuit mixes. Croutons. Seasoned rice or pasta mixes. Noodle soup cups. Boxed or frozen macaroni and cheese. Regular salted crackers. Self-rising flour. Vegetables Sauerkraut, pickled vegetables, and relishes. Olives. [place] fries. Onion rings. Regular canned vegetables (not low-sodium or reduced-sodium). Regular canned tomato sauce and paste (not low-sodium or reduced-sodium). Regular tomato and vegetable juice (not low-sodium or reduced-sodium). Frozen vegetables in sauces. Meats and other protein foods Meat or fish that is salted, canned, smoked, spiced, or pickled. Bacon, ham, sausage, hotdogs, corned beef, chipped beef, packaged lunch meats, salt pork, jerky, pickled herring, anchovies, regular canned tuna, sardines, salted nuts. Dairy Processed cheese and cheese spreads. Cheese curds. Blue cheese. Feta cheese. String cheese. Regular cottage cheese. Buttermilk. Canned milk. Fats and oils Salted butter. Regular margarine. Ghee. Bacon fat. Seasonings and other foods Onion salt, garlic salt, seasoned salt, table salt, and sea salt. Canned and packaged gravies. Worcestershire sauce. Tartar sauce. Barbecue sauce. Teriyaki sauce. Soy sauce, including reduced-sodium.  Steak sauce. Fish sauce. Oyster sauce. Cocktail sauce. Horseradish that you find on the shelf. Regular ketchup and mustard. Meat flavorings and tenderizers. Bouillon cubes. Hot sauce and Tabasco sauce. Premade or packaged marinades. Premade or packaged taco seasonings. Relishes. Regular salad dressings. Salsa. Potato and  tortilla chips. Corn chips and puffs. Salted popcorn and pretzels. Canned or dried soups. Pizza. Frozen entrees and pot pies. Summary  Eating less sodium can help lower your blood pressure, reduce swelling, and protect your heart, liver, and kidneys.  Most people on this plan should limit their sodium intake to 1,500-2,000 mg (milligrams) of sodium each day.  Canned, boxed, and frozen foods are high in sodium. Restaurant foods, fast foods, and pizza are also very high in sodium. You also get sodium by adding salt to food.  Try to cook at home, eat more fresh fruits and vegetables, and eat less fast food, canned, processed, or prepared foods. This information is not intended to replace advice given to you by your health care provider. Make sure you discuss any questions you have with your health care provider. Document Released: 10/19/2001 Document Revised: 04/22/2016 Document Reviewed: 04/22/2016 Elsevier Interactive Patient Education  2018 [place], Isabella [name], [place]  10/15/2017 12:39 PM    [facility] [address], [place], [place]  [address] Phone: [phone]; Fax: [phone]

## 2017-10-26 ENCOUNTER — Other Ambulatory Visit: Payer: Self-pay

## 2017-10-26 ENCOUNTER — Observation Stay (HOSPITAL_COMMUNITY)
Admission: EM | Admit: 2017-10-26 | Discharge: 2017-10-29 | Disposition: A | Discharge status: Home / Self Care | Payer: Medicare Other | Attending: Internal Medicine | Admitting: Internal Medicine

## 2017-10-26 ENCOUNTER — Encounter (HOSPITAL_COMMUNITY): Payer: Self-pay

## 2017-10-26 DIAGNOSIS — R001 Bradycardia, unspecified: Secondary | ICD-10-CM | POA: Diagnosis not present

## 2017-10-26 DIAGNOSIS — Z880 Allergy status to penicillin: Secondary | ICD-10-CM | POA: Insufficient documentation

## 2017-10-26 DIAGNOSIS — Z881 Allergy status to other antibiotic agents status: Secondary | ICD-10-CM | POA: Insufficient documentation

## 2017-10-26 DIAGNOSIS — K5190 Ulcerative colitis, unspecified, without complications: Secondary | ICD-10-CM | POA: Insufficient documentation

## 2017-10-26 DIAGNOSIS — D72810 Lymphocytopenia: Secondary | ICD-10-CM | POA: Insufficient documentation

## 2017-10-26 DIAGNOSIS — Y92009 Unspecified place in unspecified non-institutional (private) residence as the place of occurrence of the external cause: Secondary | ICD-10-CM | POA: Insufficient documentation

## 2017-10-26 DIAGNOSIS — Z955 Presence of coronary angioplasty implant and graft: Secondary | ICD-10-CM | POA: Insufficient documentation

## 2017-10-26 DIAGNOSIS — E7800 Pure hypercholesterolemia, unspecified: Secondary | ICD-10-CM | POA: Diagnosis not present

## 2017-10-26 DIAGNOSIS — Z888 Allergy status to other drugs, medicaments and biological substances status: Secondary | ICD-10-CM | POA: Insufficient documentation

## 2017-10-26 DIAGNOSIS — R739 Hyperglycemia, unspecified: Secondary | ICD-10-CM | POA: Insufficient documentation

## 2017-10-26 DIAGNOSIS — N179 Acute kidney failure, unspecified: Secondary | ICD-10-CM

## 2017-10-26 DIAGNOSIS — I5032 Chronic diastolic (congestive) heart failure: Secondary | ICD-10-CM | POA: Diagnosis not present

## 2017-10-26 DIAGNOSIS — I2510 Atherosclerotic heart disease of native coronary artery without angina pectoris: Secondary | ICD-10-CM | POA: Diagnosis not present

## 2017-10-26 DIAGNOSIS — E669 Obesity, unspecified: Secondary | ICD-10-CM | POA: Diagnosis not present

## 2017-10-26 DIAGNOSIS — Z803 Family history of malignant neoplasm of breast: Secondary | ICD-10-CM | POA: Diagnosis not present

## 2017-10-26 DIAGNOSIS — Z6836 Body mass index (BMI) 36.0-36.9, adult: Secondary | ICD-10-CM | POA: Insufficient documentation

## 2017-10-26 DIAGNOSIS — Z91048 Other nonmedicinal substance allergy status: Secondary | ICD-10-CM | POA: Insufficient documentation

## 2017-10-26 DIAGNOSIS — J8410 Pulmonary fibrosis, unspecified: Secondary | ICD-10-CM | POA: Insufficient documentation

## 2017-10-26 DIAGNOSIS — Z8249 Family history of ischemic heart disease and other diseases of the circulatory system: Secondary | ICD-10-CM | POA: Insufficient documentation

## 2017-10-26 DIAGNOSIS — G4733 Obstructive sleep apnea (adult) (pediatric): Secondary | ICD-10-CM | POA: Insufficient documentation

## 2017-10-26 DIAGNOSIS — I110 Hypertensive heart disease with heart failure: Secondary | ICD-10-CM | POA: Insufficient documentation

## 2017-10-26 DIAGNOSIS — T380X5A Adverse effect of glucocorticoids and synthetic analogues, initial encounter: Secondary | ICD-10-CM | POA: Diagnosis not present

## 2017-10-26 DIAGNOSIS — Z79899 Other long term (current) drug therapy: Secondary | ICD-10-CM | POA: Insufficient documentation

## 2017-10-26 DIAGNOSIS — Z7952 Long term (current) use of systemic steroids: Secondary | ICD-10-CM | POA: Diagnosis not present

## 2017-10-26 DIAGNOSIS — Z90710 Acquired absence of both cervix and uterus: Secondary | ICD-10-CM | POA: Insufficient documentation

## 2017-10-26 DIAGNOSIS — I252 Old myocardial infarction: Secondary | ICD-10-CM | POA: Insufficient documentation

## 2017-10-26 DIAGNOSIS — L03115 Cellulitis of right lower limb: Secondary | ICD-10-CM | POA: Diagnosis not present

## 2017-10-26 DIAGNOSIS — L0390 Cellulitis, unspecified: Secondary | ICD-10-CM | POA: Diagnosis present

## 2017-10-26 DIAGNOSIS — E440 Moderate protein-calorie malnutrition: Secondary | ICD-10-CM | POA: Diagnosis not present

## 2017-10-26 DIAGNOSIS — Z885 Allergy status to narcotic agent status: Secondary | ICD-10-CM | POA: Insufficient documentation

## 2017-10-26 DIAGNOSIS — Z9851 Tubal ligation status: Secondary | ICD-10-CM | POA: Insufficient documentation

## 2017-10-26 DIAGNOSIS — Z96653 Presence of artificial knee joint, bilateral: Secondary | ICD-10-CM | POA: Insufficient documentation

## 2017-10-26 LAB — COMPREHENSIVE METABOLIC PANEL
ALT: 27 U/L (ref 14–54)
AST: 34 U/L (ref 15–41)
Albumin: 2.8 g/dL — ABNORMAL LOW (ref 3.5–5.0)
Alkaline Phosphatase: 62 U/L (ref 38–126)
Anion gap: 11 (ref 5–15)
BUN: 23 mg/dL — ABNORMAL HIGH (ref 6–20)
CALCIUM: 8.1 mg/dL — AB (ref 8.9–10.3)
CO2: 24 mmol/L (ref 22–32)
Chloride: 101 mmol/L (ref 101–111)
Creatinine, Ser: 1.51 mg/dL — ABNORMAL HIGH (ref 0.44–1.00)
GFR calc non Af Amer: 33 mL/min — ABNORMAL LOW (ref >60)
GFR, EST AFRICAN AMERICAN: 38 mL/min — AB (ref >60)
GLUCOSE: 202 mg/dL — AB (ref 65–99)
Potassium: 3.8 mmol/L (ref 3.5–5.1)
SODIUM: 136 mmol/L (ref 135–145)
Total Bilirubin: 0.5 mg/dL (ref 0.3–1.2)
Total Protein: 5.5 g/dL — ABNORMAL LOW (ref 6.5–8.1)

## 2017-10-26 LAB — URINALYSIS, ROUTINE W REFLEX MICROSCOPIC
Bilirubin Urine: NEGATIVE
Glucose, UA: NEGATIVE mg/dL
KETONES UR: NEGATIVE mg/dL
Nitrite: NEGATIVE
PH: 5 (ref 5.0–8.0)
Protein, ur: NEGATIVE mg/dL
SPECIFIC GRAVITY, URINE: 1.009 (ref 1.005–1.030)

## 2017-10-26 LAB — CBC WITH DIFFERENTIAL/PLATELET
Abs Immature Granulocytes: 0.2 10*3/uL — ABNORMAL HIGH (ref 0.0–0.1)
BASOS PCT: 0 %
Basophils Absolute: 0 10*3/uL (ref 0.0–0.1)
EOS ABS: 0 10*3/uL (ref 0.0–0.7)
Eosinophils Relative: 0 %
HEMATOCRIT: 32.8 % — AB (ref 36.0–46.0)
Hemoglobin: 10.1 g/dL — ABNORMAL LOW (ref 12.0–15.0)
Immature Granulocytes: 2 %
Lymphocytes Relative: 4 %
Lymphs Abs: 0.3 10*3/uL — ABNORMAL LOW (ref 0.7–4.0)
MCH: 26.8 pg (ref 26.0–34.0)
MCHC: 30.8 g/dL (ref 30.0–36.0)
MCV: 87 fL (ref 78.0–100.0)
MONO ABS: 0.1 10*3/uL (ref 0.1–1.0)
MONOS PCT: 2 %
NEUTROS PCT: 92 %
Neutro Abs: 6.6 10*3/uL (ref 1.7–7.7)
PLATELETS: 205 10*3/uL (ref 150–400)
RBC: 3.77 MIL/uL — ABNORMAL LOW (ref 3.87–5.11)
RDW: 21 % — AB (ref 11.5–15.5)
WBC: 7.3 10*3/uL (ref 4.0–10.5)

## 2017-10-26 LAB — I-STAT CG4 LACTIC ACID, ED
LACTIC ACID, VENOUS: 1.22 mmol/L (ref 0.5–1.9)
Lactic Acid, Venous: 2.22 mmol/L (ref 0.5–1.9)

## 2017-10-26 MED ORDER — VANCOMYCIN HCL 10 G IV SOLR
2000.0000 mg | Freq: Once | INTRAVENOUS | Status: AC
  Administered 2017-10-27: 2000 mg via INTRAVENOUS
  Filled 2017-10-26: qty 2000

## 2017-10-26 NOTE — ED Notes (Signed)
ED Provider at bedside. 

## 2017-10-26 NOTE — ED Notes (Signed)
NF RN [place] and Dr Rex Kras informed of pt lactic acid results 2.22

## 2017-10-26 NOTE — ED Triage Notes (Signed)
Pt states that since she left the hospital she has had swelling and redness to her r lower leg, pt has two spots that are weeping. Fevers at home.

## 2017-10-26 NOTE — ED Provider Notes (Addendum)
[facility] EMERGENCY DEPARTMENT Provider Note   CSN: [id number] Arrival date & time: 10/26/17  2012     History   Chief Complaint Chief Complaint  Patient presents with  . Cellulitis    HPI Isabella [name] is a 77 y.o. female presenting for evaluation of leg swelling and redness.  Patient states for the past 4 days, she has had redness and worsening swelling of her right leg.  She noticed drainage from the area today.  She had a temperature at home of 100.3.  Her right leg has continued to be swollen, has worsened over the past several days.  She had bilateral leg swelling since being in the hospital last month, although it has improved on the left side.  She denies trauma or injury.  Earlier this morning, patient had a heart rate of 150, this improved after taking her metoprolol.  She denies chest pain or shortness of breath with this.  She is immunocompromised, on multiple immunosuppressive drugs for UC including prednisone, Xeljanz, and mesalamine.  Patient states that she has been much more tired today than normal, normally wakes up at 6 AM, did not wake up until noon.  She denies headache, nausea, vomiting, abdominal pain, urinary symptoms, or abnormal bowel movements. Pt takes lasix daily.   HPI  Past Medical History:  Diagnosis Date  . Arthritis   . Complication of anesthesia    Took a while to wake up with knee  . Coronary artery disease   . Dyspnea    with walking at times  . Fallen bladder   . GERD (gastroesophageal reflux disease)   . 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 Headache    migraines in her younger years  . Hemorrhoids   . High cholesterol   . History of blood transfusion   . Hx of adenomatous colonic polyps   . Hypertension   . Lung nodule   . Myocardial infarct ([place]) 03/24/2011   Dr. [name]  . Nocturnal leg cramps   . Obesity   . Sarcoidosis   . Sleep  apnea    uses cpap    Patient Active Problem List   Diagnosis Date Noted  . Acute diastolic CHF (congestive heart failure) ([place]) 10/15/2017  . Acute on chronic colitis 09/29/2017  . GERD (gastroesophageal reflux disease) 09/29/2017  . Degenerative spondylolisthesis 02/01/2014  . Sarcoidosis 07/14/2013  . Abnormal CT of the chest 04/21/2013  . Coronary atherosclerosis of native coronary artery 03/29/2013    Class: Chronic  . Old MI (myocardial infarction) 03/29/2013    Class: Chronic  . OA (osteoarthritis) of knee 06/17/2012  . Lung nodule 06/03/2012  . DJD (degenerative joint disease) 06/01/2012  . 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 Edema of lower extremity 03/29/2011  . Anemia associated with acute blood loss 03/25/2011  . Hematoma following PCI 03/24/2011  . Obesity (BMI 30-39.9)   . Obstructive sleep apnea 01/25/2009  . Hyperlipidemia 01/24/2009  . Essential hypertension 01/24/2009    Past Surgical History:  Procedure Laterality Date  . ABDOMINAL HYSTERECTOMY  1990  . APPENDECTOMY  1970  . BACK SURGERY  2015   lower back  . CARDIAC CATHETERIZATION  03/2011  . CARPAL TUNNEL RELEASE Bilateral 2004  . CATARACT EXTRACTION    . CHOLECYSTECTOMY  1970  . COLONOSCOPY W/ BIOPSIES AND POLYPECTOMY    . CORONARY ANGIOPLASTY  2012  .  CORONARY STENT PLACEMENT  03/24/2011   Dr. [name]  . EYE SURGERY  2008,2011   corneal transplant  . FEMORAL ARTERY EXPLORATION  03/25/2011   Procedure: FEMORAL ARTERY EXPLORATION;  Surgeon: Elam Dutch, MD;  Location: [facility];  Service: Vascular;  Laterality: Right;  Evacuation of Hematoma   . hematoma surgery     from cardiac cath -hematoma in groin-right  . HERNIA REPAIR  0865   umbilical  . JOINT REPLACEMENT    . KNEE ARTHROSCOPY  2006   Right  . [facility] Left Eye  2012  . LEFT HEART CATHETERIZATION WITH CORONARY ANGIOGRAM N/A 03/24/2011   Procedure: LEFT HEART CATHETERIZATION WITH CORONARY ANGIOGRAM;  Surgeon: Sinclair Grooms,  MD;  Location: [facility] CATH LAB;  Service: Cardiovascular;  Laterality: N/A;  . PERCUTANEOUS CORONARY STENT INTERVENTION (PCI-S) N/A 03/24/2011   Procedure: PERCUTANEOUS CORONARY STENT INTERVENTION (PCI-S);  Surgeon: Sinclair Grooms, MD;  Location: [facility] CATH LAB;  Service: Cardiovascular;  Laterality: N/A;  . SHOULDER SURGERY  2010  . TONSILLECTOMY  1949  . TONSILLECTOMY AND ADENOIDECTOMY    . TOTAL KNEE ARTHROPLASTY  07/2010   Left  . TOTAL KNEE ARTHROPLASTY  06/17/2012   Procedure: TOTAL KNEE ARTHROPLASTY;  Surgeon: Gearlean Alf, MD;  Location: [facility];  Service: Orthopedics;  Laterality: Right;  . TUBAL LIGATION  1978  . [place], 2007   x 2  . VIDEO BRONCHOSCOPY WITH ENDOBRONCHIAL NAVIGATION  04/13/2012   Procedure: VIDEO BRONCHOSCOPY WITH ENDOBRONCHIAL NAVIGATION;  Surgeon: Melrose Nakayama, MD;  Location: [place];  Service: Thoracic;  Laterality: N/A;  . VIDEO BRONCHOSCOPY WITH ENDOBRONCHIAL NAVIGATION N/A 04/08/2016   Procedure: VIDEO BRONCHOSCOPY WITH ENDOBRONCHIAL NAVIGATION;  Surgeon: Rigoberto Noel, MD;  Location: [place];  Service: Thoracic;  Laterality: N/A;     OB History   None      Home Medications    Prior to Admission medications   Medication Sig Start Date End Date Taking? Authorizing Provider  acetaminophen (TYLENOL) 500 MG tablet Take 500 mg by mouth every 6 (six) hours as needed for mild pain.   Yes [provider]  atorvastatin (LIPITOR) 40 MG tablet Take 40 mg by mouth at bedtime.   Yes [provider]  chlorpheniramine (CHLOR-TRIMETON) 4 MG tablet Take 8 mg by mouth at bedtime.    Yes [provider]  famotidine (PEPCID) 20 MG tablet Take 1 tablet (20 mg total) by mouth 2 (two) times daily. 10/05/17  Yes Buriev, Arie Sabina, MD  furosemide (LASIX) 20 MG tablet Take 20 mg by mouth daily.    Yes [provider]  losartan (COZAAR) 50 MG tablet Take 50 mg by mouth daily.    Yes [provider]  mesalamine  (APRISO) 0.375 g 24 hr capsule Take 1,500 mg by mouth daily.   Yes [provider]  metoprolol tartrate (LOPRESSOR) 25 MG tablet Take 25 mg by mouth 2 (two) times daily.   Yes [provider]  Multiple Vitamins-Minerals (CENTRUM SILVER 50+WOMEN) TABS Take 1 tablet by mouth daily with lunch.    Yes [provider]  nitroGLYCERIN (NITROSTAT) 0.4 MG SL tablet Place 0.4 mg under the tongue every 5 (five) minutes as needed for chest pain.   Yes [provider]  potassium chloride SA (K-DUR,KLOR-CON) 20 MEQ tablet Take 20 mEq by mouth daily.   Yes [provider]  prednisoLONE acetate (PRED FORTE) 1 % ophthalmic suspension Place 1 drop  into the left eye 2 (two) times daily.  05/15/17  Yes [provider]  predniSONE (DELTASONE) 20 MG tablet Take 3 tablets (60 mg total) by mouth daily with breakfast. Patient taking differently: Take 30 mg by mouth daily with breakfast.  10/05/17  Yes Buriev, Arie Sabina, MD  Tofacitinib Citrate (XELJANZ) 10 MG TABS Take 10 mg by mouth 2 (two) times daily.   Yes [provider]  UNABLE TO FIND CPAP: At bedtime nightly and during all naps   Yes [provider]  nystatin (MYCOSTATIN) 100000 UNIT/ML suspension Take 5 mLs (500,000 Units total) by mouth 4 (four) times daily. Patient not taking: Reported on 10/26/2017 10/05/17   Kinnie Feil, MD    Family History Family History  Problem Relation Age of Onset  . Hypertension Mother   . Cancer Father   . Breast cancer Maternal Grandmother     Social History Social History   Tobacco Use  . Smoking status: Never Smoker  . Smokeless tobacco: Never Used  Substance Use Topics  . Alcohol use: No  . Drug use: No     Allergies   Imodium a-d [loperamide hcl]; Penicillins; Tramadol; Zestril [lisinopril]; Hornet venom; Neomycin; Adhesive [tape]; Codeine; Erythromycin; Hydromorphone; and Percocet [oxycodone-acetaminophen]   Review of Systems Review of  Systems  Cardiovascular: Positive for leg swelling.  Skin: Positive for color change.  Hematological: Does not bruise/bleed easily.  All other systems reviewed and are negative.    Physical Exam Updated Vital Signs BP 121/71   Pulse (!) 53   Temp 99.8 F (37.7 C) (Oral)   Resp 17   SpO2 97%   Physical Exam  Constitutional: She is oriented to person, place, and time. She appears well-developed and well-nourished. No distress.  In no distress  HENT:  Head: Normocephalic and atraumatic.  Eyes: Pupils are equal, round, and reactive to light. Conjunctivae and EOM are normal.  Neck: Normal range of motion. Neck supple.  Cardiovascular: Normal rate, regular rhythm and intact distal pulses.  Pulmonary/Chest: Effort normal and breath sounds normal. No respiratory distress. She has no wheezes.  Clear lung sounds in all fields.  Speaking in full sentences.  Abdominal: Soft. She exhibits no distension and no mass. There is no tenderness. There is no guarding.  Musculoskeletal: Normal range of motion. She exhibits edema and tenderness.  3+ pitting edema bilaterally.  Right leg is more swollen than the left.  Localized erythema of the anterior aspect of the right lower leg with 2 areas of purulent drainage. R lower leg tender to palpation.  No streaking.  No erythema noted of the left leg.  See pictures below.  Neurological: She is alert and oriented to person, place, and time. No sensory deficit.  Skin: Skin is warm and dry. Capillary refill takes less than 2 seconds.  Psychiatric: She has a normal mood and affect.  Nursing note and vitals reviewed.      ED Treatments / Results  Labs (all labs ordered are listed, but only abnormal results are displayed) Labs Reviewed  COMPREHENSIVE METABOLIC PANEL - Abnormal; Notable for the following components:      Result Value   Glucose, Bld 202 (*)    BUN 23 (*)    Creatinine, Ser 1.51 (*)    Calcium 8.1 (*)    Total Protein 5.5 (*)     Albumin 2.8 (*)    GFR calc non Af Amer 33 (*)    GFR calc Af Amer 38 (*)  All other components within normal limits  CBC WITH DIFFERENTIAL/PLATELET - Abnormal; Notable for the following components:   RBC 3.77 (*)    Hemoglobin 10.1 (*)    HCT 32.8 (*)    RDW 21.0 (*)    Lymphs Abs 0.3 (*)    Abs Immature Granulocytes 0.2 (*)    All other components within normal limits  URINALYSIS, ROUTINE W REFLEX MICROSCOPIC - Abnormal; Notable for the following components:   APPearance HAZY (*)    Hgb urine dipstick SMALL (*)    Leukocytes, UA MODERATE (*)    Bacteria, UA MANY (*)    All other components within normal limits  I-STAT CG4 LACTIC ACID, ED - Abnormal; Notable for the following components:   Lactic Acid, Venous 2.22 (*)    All other components within normal limits  CULTURE, BLOOD (ROUTINE X 2)  CULTURE, BLOOD (ROUTINE X 2)  I-STAT CG4 LACTIC ACID, ED    EKG None  Radiology No results found.  Procedures .Critical Care Performed by: Franchot Heidelberg, PA-C Authorized by: Franchot Heidelberg, PA-C   Critical care provider statement:    Critical care time (minutes):  35   Critical care time was exclusive of:  Separately billable procedures and treating other patients and teaching time   Critical care was necessary to treat or prevent imminent or life-threatening deterioration of the following conditions: infection.   Critical care was time spent personally by me on the following activities:  Blood draw for specimens, development of treatment plan with patient or surrogate, discussions with consultants, examination of patient, obtaining history from patient or surrogate, ordering and performing treatments and interventions, ordering and review of laboratory studies, ordering and review of radiographic studies, pulse oximetry, re-evaluation of patient's condition and review of old charts   I assumed direction of critical care for this patient from another provider in my specialty:  no   Comments:     Pt with cellulitis with elevated lactic. abx given and pt admitted   (including critical care time)  Medications Ordered in ED Medications  vancomycin (VANCOCIN) 2,000 mg in sodium chloride 0.9 % 500 mL IVPB (has no administration in time range)     Initial Impression / Assessment and Plan / ED Course  I have reviewed the triage vital signs and the nursing notes.  Pertinent labs & imaging results that were available during my care of the patient were reviewed by me and considered in my medical decision making (see chart for details).  Clinical Course as of Oct 27 2351  Sun Oct 27, 2646  6343 77 year old female on prednisone and immune suppressants for her UC is here with fluid overload in her lower extremities with what likely is an early cellulitis in the right lower extremity anterior shin.  She said they have been weeping and getting red and painful.  Here her white count is normal her lactate is slightly elevated.  Her creatinine is a little higher than her baseline.  I think she would benefit from coming in getting diuresed along with some IV.   [MB]    Clinical Course User Index [MB] Hayden Rasmussen, MD    Pt presenting for evaluation of right lower leg swelling, redness, and tenderness.  Physical exam shows patient who is currently afebrile not tachycardic.  She appears nontoxic.  Has cellulitis with purulent drainage of the right lower extremity and significant 3+ pitting edema bilaterally.  Labs show elevated lactic at 2.2, white count is normal.  She  does not meet septic criteria at this time, doubt sepsis.  I am concerned about patient's ability to heal due to her immunosuppression and edema.  Additionally, patient's episode of tachycardia this morning is concerning, although she has had no further episodes of tachycardia afternoon or while in the ER.  Patient denies chest pain or shortness of breath, will not pursue cardiac angle at this time.  Discussed  with attending, Dr. Melina Copa evaluated the patient.  Will obtain blood cultures and start vancomycin.  Final Clinical Impressions(s) / ED Diagnoses   Final diagnoses:  Cellulitis of right lower extremity  AKI (acute kidney injury) [facility])    ED Discharge Orders    None       Franchot Heidelberg, PA-C 10/26/17 2353    Hayden Rasmussen, MD 10/27/17 [place], Laurelle Skiver, PA-C 11/12/17 2010    Hayden Rasmussen, MD 11/13/17 [phone]

## 2017-10-26 NOTE — Progress Notes (Signed)
Pharmacy Antibiotic Note  Isabella [name] is a 77 y.o. female admitted on 10/26/2017 with cellulitis.  Pharmacy has been consulted for vancomycin dosing.  Plan: Vancomycin 2gm IV x 1 then 1gm IV q24 hours F/u renal function, cultures and clinical course     Temp (24hrs), Avg:99.8 F (37.7 C), Min:99.8 F (37.7 C), Max:99.8 F (37.7 C)  Recent Labs  Lab 10/26/17 2028 10/26/17 2034  WBC 7.3  --   CREATININE 1.51*  --   LATICACIDVEN  --  2.22*    Estimated Creatinine Clearance: 33.9 mL/min (A) (by C-G formula based on SCr of 1.51 mg/dL (H)).    Allergies  Allergen Reactions  . Imodium A-D [Loperamide Hcl] Shortness Of Breath and Other (See Comments)    Caused a pain & "squeezing" sensation in the lungs also  . Penicillins Hives and Shortness Of Breath    Reaction at 77 years old Has patient had a PCN reaction causing immediate rash, facial/tongue/throat swelling, SOB or lightheadedness with hypotension: Yes Has patient had a PCN reaction causing severe rash involving mucus membranes or skin necrosis: No Has patient had a PCN reaction that required hospitalization No Has patient had a PCN reaction occurring within the last 10 years: No If all of the above answers are "NO", then may proceed with Cephalosporin use.  . Tramadol Shortness Of Breath and Other (See Comments)    Per patient "felt like lungs were being squeezed"  . Zestril [Lisinopril] Cough  . Hornet Venom Swelling  . Neomycin Swelling and Other (See Comments)    Reaction to eye ointment - eyes became swollen shut Makes wounds ooze  . Adhesive [Tape] Rash  . Codeine Other (See Comments)    Made back hurt  . Erythromycin Diarrhea  . Hydromorphone Other (See Comments)    Severe Lethargy   . Percocet [Oxycodone-Acetaminophen] Other (See Comments)    Confusion      Thank you for allowing pharmacy to be a part of this patient's care.  Excell Seltzer Poteet 10/26/2017 11:21 PM

## 2017-10-27 ENCOUNTER — Other Ambulatory Visit: Payer: Self-pay

## 2017-10-27 DIAGNOSIS — L0390 Cellulitis, unspecified: Secondary | ICD-10-CM | POA: Diagnosis present

## 2017-10-27 DIAGNOSIS — L03115 Cellulitis of right lower limb: Secondary | ICD-10-CM

## 2017-10-27 LAB — BASIC METABOLIC PANEL
Anion gap: 9 (ref 5–15)
BUN: 18 mg/dL (ref 6–20)
CHLORIDE: 107 mmol/L (ref 101–111)
CO2: 28 mmol/L (ref 22–32)
CREATININE: 1.24 mg/dL — AB (ref 0.44–1.00)
Calcium: 8.2 mg/dL — ABNORMAL LOW (ref 8.9–10.3)
GFR calc Af Amer: 48 mL/min — ABNORMAL LOW (ref >60)
GFR calc non Af Amer: 42 mL/min — ABNORMAL LOW (ref >60)
GLUCOSE: 113 mg/dL — AB (ref 65–99)
Potassium: 3.2 mmol/L — ABNORMAL LOW (ref 3.5–5.1)
SODIUM: 144 mmol/L (ref 135–145)

## 2017-10-27 LAB — CBC
HCT: 30.9 % — ABNORMAL LOW (ref 36.0–46.0)
Hemoglobin: 9.4 g/dL — ABNORMAL LOW (ref 12.0–15.0)
MCH: 26.8 pg (ref 26.0–34.0)
MCHC: 30.4 g/dL (ref 30.0–36.0)
MCV: 88 fL (ref 78.0–100.0)
PLATELETS: 176 10*3/uL (ref 150–400)
RBC: 3.51 MIL/uL — ABNORMAL LOW (ref 3.87–5.11)
RDW: 21.2 % — AB (ref 11.5–15.5)
WBC: 5.8 10*3/uL (ref 4.0–10.5)

## 2017-10-27 LAB — GLUCOSE, CAPILLARY
GLUCOSE-CAPILLARY: 164 mg/dL — AB (ref 65–99)
Glucose-Capillary: 135 mg/dL — ABNORMAL HIGH (ref 65–99)
Glucose-Capillary: 157 mg/dL — ABNORMAL HIGH (ref 65–99)

## 2017-10-27 LAB — CBG MONITORING, ED
Glucose-Capillary: 96 mg/dL (ref 65–99)
Glucose-Capillary: 132 mg/dL — ABNORMAL HIGH (ref 65–99)

## 2017-10-27 MED ORDER — ADULT MULTIVITAMIN W/MINERALS CH
1.0000 | ORAL_TABLET | Freq: Every day | ORAL | Status: DC
  Administered 2017-10-27 – 2017-10-29 (×3): 1 via ORAL
  Filled 2017-10-27 (×3): qty 1

## 2017-10-27 MED ORDER — METOPROLOL TARTRATE 25 MG PO TABS: 25.0000 mg | ORAL_TABLET | Freq: Two times a day (BID) | ORAL | Status: DC

## 2017-10-27 MED ORDER — MESALAMINE ER 250 MG PO CPCR
1500.0000 mg | ORAL_CAPSULE | Freq: Every day | ORAL | Status: DC
  Administered 2017-10-27 – 2017-10-29 (×3): 1500 mg via ORAL
  Filled 2017-10-27 (×3): qty 6

## 2017-10-27 MED ORDER — INSULIN ASPART 100 UNIT/ML ~~LOC~~ SOLN
0.0000 [IU] | Freq: Three times a day (TID) | SUBCUTANEOUS | Status: DC
  Administered 2017-10-27 (×2): 1 [IU] via SUBCUTANEOUS
  Administered 2017-10-28 – 2017-10-29 (×2): 2 [IU] via SUBCUTANEOUS
  Filled 2017-10-27: qty 1

## 2017-10-27 MED ORDER — VANCOMYCIN HCL IN DEXTROSE 1-5 GM/200ML-% IV SOLN: 1000.0000 mg | Freq: Once | INTRAVENOUS | Status: DC

## 2017-10-27 MED ORDER — NITROGLYCERIN 0.4 MG SL SUBL: 0.4000 mg | SUBLINGUAL_TABLET | SUBLINGUAL | Status: DC | PRN

## 2017-10-27 MED ORDER — FAMOTIDINE 20 MG PO TABS
20.0000 mg | ORAL_TABLET | Freq: Two times a day (BID) | ORAL | Status: DC
  Administered 2017-10-27 – 2017-10-29 (×5): 20 mg via ORAL
  Filled 2017-10-27 (×5): qty 1

## 2017-10-27 MED ORDER — PREDNISONE 10 MG PO TABS
30.0000 mg | ORAL_TABLET | Freq: Every day | ORAL | Status: DC
  Administered 2017-10-27 – 2017-10-29 (×3): 30 mg via ORAL
  Filled 2017-10-27 (×2): qty 1
  Filled 2017-10-27: qty 2

## 2017-10-27 MED ORDER — POTASSIUM CHLORIDE CRYS ER 20 MEQ PO TBCR
40.0000 meq | EXTENDED_RELEASE_TABLET | Freq: Once | ORAL | Status: AC
  Administered 2017-10-27: 40 meq via ORAL
  Filled 2017-10-27: qty 2

## 2017-10-27 MED ORDER — ATORVASTATIN CALCIUM 40 MG PO TABS
40.0000 mg | ORAL_TABLET | Freq: Every day | ORAL | Status: DC
  Administered 2017-10-27 – 2017-10-28 (×2): 40 mg via ORAL
  Filled 2017-10-27 (×2): qty 1

## 2017-10-27 MED ORDER — VANCOMYCIN HCL 10 G IV SOLR
1500.0000 mg | INTRAVENOUS | Status: DC
  Administered 2017-10-28 – 2017-10-29 (×2): 1500 mg via INTRAVENOUS
  Filled 2017-10-27 (×4): qty 1500

## 2017-10-27 MED ORDER — HEPARIN SODIUM (PORCINE) 5000 UNIT/ML IJ SOLN
5000.0000 [IU] | Freq: Three times a day (TID) | INTRAMUSCULAR | Status: DC
  Administered 2017-10-27 – 2017-10-29 (×8): 5000 [IU] via SUBCUTANEOUS
  Filled 2017-10-27 (×7): qty 1

## 2017-10-27 MED ORDER — PREDNISOLONE ACETATE 1 % OP SUSP
1.0000 [drp] | Freq: Two times a day (BID) | OPHTHALMIC | Status: DC
  Administered 2017-10-27 – 2017-10-29 (×5): 1 [drp] via OPHTHALMIC
  Filled 2017-10-27 (×2): qty 5

## 2017-10-27 MED ORDER — DOXYCYCLINE HYCLATE 100 MG PO TABS: 100.0000 mg | ORAL_TABLET | Freq: Two times a day (BID) | ORAL | Status: DC

## 2017-10-27 MED ORDER — SODIUM CHLORIDE 0.9% FLUSH
3.0000 mL | Freq: Two times a day (BID) | INTRAVENOUS | Status: DC
  Administered 2017-10-27 – 2017-10-29 (×5): 3 mL via INTRAVENOUS

## 2017-10-27 MED ORDER — TOFACITINIB CITRATE 10 MG PO TABS
10.0000 mg | ORAL_TABLET | Freq: Two times a day (BID) | ORAL | Status: DC
  Administered 2017-10-27 – 2017-10-29 (×4): 10 mg via ORAL
  Filled 2017-10-27 (×4): qty 1

## 2017-10-27 NOTE — Progress Notes (Signed)
Patient admitted after midnight, please see H&P.  Right leg cellulitis-- vanc IV given x 1 dose-- minimal improvement.  Started on PO doxy by admitter this AM but will change back to IV vanc for now until showing improvement.  Elevate extremities.  Is immunocompromised from colitis treatment.  Eulogio Bear DO

## 2017-10-27 NOTE — ED Notes (Signed)
Bilateral pedal dorsalis pedis pulses obtained with dopler.

## 2017-10-27 NOTE — ED Notes (Signed)
Breakfast tray ordered 

## 2017-10-27 NOTE — Progress Notes (Signed)
Pharmacy Antibiotic Note  Isabella [name] is a 77 y.o. female admitted on 10/26/2017 with cellulitis.  Pharmacy has been consulted for vancomycin dosing - received 2g IV load 6/17 at 0026, order briefly d/c'd and then re-ordered per MD. SCr trended down to 1.24.  Plan: Continue vancomycin 1558m IV q24h Monitor clinical progress, c/s, renal function F/u de-escalation plan/LOT, vancomycin trough as indicated      Temp (24hrs), Avg:99.7 F (37.6 C), Min:99.6 F (37.6 C), Max:99.8 F (37.7 C)  Recent Labs  Lab 10/26/17 2028 10/26/17 2034 10/26/17 2324 10/27/17 0423  WBC 7.3  --   --  5.8  CREATININE 1.51*  --   --  1.24*  LATICACIDVEN  --  2.22* 1.22  --     Estimated Creatinine Clearance: 41.3 mL/min (A) (by C-G formula based on SCr of 1.24 mg/dL (H)).    Allergies  Allergen Reactions  . Imodium A-D [Loperamide Hcl] Shortness Of Breath and Other (See Comments)    Caused a pain & "squeezing" sensation in the lungs also  . Penicillins Hives and Shortness Of Breath    Reaction at 77years old Has patient had a PCN reaction causing immediate rash, facial/tongue/throat swelling, SOB or lightheadedness with hypotension: Yes Has patient had a PCN reaction causing severe rash involving mucus membranes or skin necrosis: No Has patient had a PCN reaction that required hospitalization No Has patient had a PCN reaction occurring within the last 10 years: No If all of the above answers are "NO", then may proceed with Cephalosporin use.  . Tramadol Shortness Of Breath and Other (See Comments)    Per patient "felt like lungs were being squeezed"  . Zestril [Lisinopril] Cough  . Hornet Venom Swelling  . Neomycin Swelling and Other (See Comments)    Reaction to eye ointment - eyes became swollen shut Makes wounds ooze  . Adhesive [Tape] Rash  . Codeine Other (See Comments)    Made back hurt  . Erythromycin Diarrhea  . Hydromorphone Other (See Comments)    Severe Lethargy   . Percocet  [Oxycodone-Acetaminophen] Other (See Comments)    Confusion     HElicia Lamp PharmD, BCPS Clinical Pharmacist Clinical phone for 10/27/2017 until 3:30pm: [phone] after 3:30pm, please call main pharmacy at: [phone] 10/27/2017 1:22 PM

## 2017-10-27 NOTE — Progress Notes (Signed)
New Admission Note:   Arrival Method: Bed Mental Orientation: A&O X4 Telemetry: Not ordered Assessment: Completed Skin: See Flowsheets IV: WDL Pain: Denies Tubes: Purewick set up.  Safety Measures: Safety Fall Prevention Plan has been given, discussed and signed Admission: Completed Unit Orientation: Patient has been orientated to the room, unit and staff.   Orders have been reviewed and implemented. Will continue to monitor the patient. Call light has been placed within reach and bed alarm has been activated.    Dixie Dials RN, BSN

## 2017-10-27 NOTE — ED Notes (Signed)
Dr. Eliseo Squires at bedside

## 2017-10-27 NOTE — H&P (Signed)
History and Physical   Isabella [name] HYW:[id number] DOB: 09-Jun-1940 DOA: 10/26/2017  PCP: [name], MD  Chief Complaint: leg redness  HPI: this is a 77 year old woman with medical problems including obesity, ulcerative colitis on multi agent regimen including prednisone, obstructive sleep apnea on nightly CPAP, coronary artery disease status post stent over 2 years ago, chronic diastolic heart failure who presents with right lower extremity redness.  Of note, the patient was admitted in May 2019 with an ulcerative colitis flare, since then she's been maintained on prednisone. At one time she is on 60 mg now titrated down to 30 mg daily. She reports struggling with bilateral lower extremity edema that has been relatively stable. However, she reports over the course the past week developing worsening redness of her right lower extremity that is culminated in 2 regions that have been draining fluid. She reports MAXIMUM TEMPERATURE of 100.2 Fahrenheit at home in addition to having tachycardia above 120 per her report. She denies syncope, presyncope, chest pain, shortness of breath, nausea, vomiting, blood in stool. She reports her bowel movement frequency is one to 2 times daily. She is retired and has a daughter that lives next door. She reports being independent in her ADLs and IADLs. She endorses single-prong cane. She cites multiple surgeries in the past including low back surgery. She takes Lasix daily 40 mg, was evaluated by outpatient team about a week and half ago where her dose was increased for a few days which did not say help her lower shin edema. She denies orthopnea or paroxysmal nocturnal dyspnea, she reports her weight has been stable.  ED Course: in the emergency department vital signs remarkable for heart rate of 51, respiratory 21, systolic blood pressure of 115 mm Hg.  Diagnostics obtained include CMP remarkable for creatinine of 1.5, blood sugar 22, albumin of 2.2. Lactic acid was  slightly elevated 2.2 which normalized with IV fluids. Hemoglobin 10.1. Absolute lymphocyte count 300.  The patient is given IV vancomycin and Hospital medicine consult further management.  Review of Systems: A complete ROS was obtained; pertinent positives negatives are denoted in the HPI. Otherwise, all systems are negative.   Past Medical History:  Diagnosis Date  . Arthritis   . Complication of anesthesia    Took a while to wake up with knee  . Coronary artery disease   . Dyspnea    with walking at times  . Fallen bladder   . GERD (gastroesophageal reflux disease)   . 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 Headache    migraines in her younger years  . Hemorrhoids   . High cholesterol   . History of blood transfusion   . Hx of adenomatous colonic polyps   . Hypertension   . Lung nodule   . Myocardial infarct ([place]) 03/24/2011   Dr. [name]  . Nocturnal leg cramps   . Obesity   . Sarcoidosis   . Sleep apnea    uses cpap   Social History   Socioeconomic History  . Marital status: Widowed    Spouse name: Not on file  . Number of children: Not on file  . Years of education: Not on file  . Highest education level: Not on file  Occupational History  . Occupation: Retired    Fish farm manager: RETIRED  Social Needs  . Financial resource strain: Not on file  . Food insecurity:  Worry: Not on file    Inability: Not on file  . Transportation needs:    Medical: Not on file    Non-medical: Not on file  Tobacco Use  . Smoking status: Never Smoker  . Smokeless tobacco: Never Used  Substance and Sexual Activity  . Alcohol use: No  . Drug use: No  . Sexual activity: Not on file  Lifestyle  . Physical activity:    Days per week: Not on file    Minutes per session: Not on file  . Stress: Not on file  Relationships  . Social connections:    Talks on phone: Not on file    Gets together: Not on file     Attends religious service: Not on file    Active member of club or organization: Not on file    Attends meetings of clubs or organizations: Not on file    Relationship status: Not on file  . Intimate partner violence:    Fear of current or ex partner: Not on file    Emotionally abused: Not on file    Physically abused: Not on file    Forced sexual activity: Not on file  Other Topics Concern  . Not on file  Social History Narrative   Tobacco use cigarettes: never smoked, tobacco history last updated 12/23/2013. No smoking. No alcohol. Caffeine: yes, coffee, 3 servings daily.recreational drug use: never. No diet. Exercise: walks everyday. Occupation: retired. Marital status: married. Children: boys, 2 girls, 2. Seat belt use: yes.   Family History  Problem Relation Age of Onset  . Hypertension Mother   . Cancer Father   . Breast cancer Maternal Grandmother     Physical Exam: Vitals:   10/26/17 2145 10/26/17 2215 10/26/17 2245 10/26/17 2300  BP: 111/71 115/73 121/71 115/72  Pulse: (!) 55 (!) 58 (!) 53 (!) 51  Resp: 16 (!) 23 17 (!) 21  Temp:      TempSrc:      SpO2: 97% 97% 97% 95%   General: Appears calm and comfortable obese white woman. ENT: Grossly normal hearing, MMM. Cardiovascular: HR 50-60 bpm. No M/R/G. 2+ b/l pitting edema. Respiratory: CTA bilaterally. No wheezes or crackles. Normal respiratory effort. Abdomen: Soft, non-tender.  Skin: Right lower extremity with ~10 cm x 10 cm erythema with 2 draining foci (non-purulent) - outlined with skin marking pen Musculoskeletal: Grossly normal tone BUE/BLE. Appropriate ROM.  Psychiatric: Grossly normal mood and affect. Neurologic: Moves all extremities in coordinated fashion.  I have personally reviewed the following labs, culture data, and imaging studies.  Assessment/Plan:  #Cellulitis of right lower extremity in setting of immunosuppression On prednisone + immunosuppressives for her UC. Found to be lymphopenic. Plan:  s/p vancomycin in the emergency department, will tentatively plan on transitioning to po doxycycline tomorrow morning - if she worsens / not improved; consider continuing with vancomycin  #AKI: baseline Cr of 1.1, on admission Cr of 1.5; uncertain cause - will hold BB given bradycardia on admission, will also hold other BP medications at this time; monitor  #Other problems: -UC: hold tofacitinib; continue current prednisone dose (30 mg) and mesalamine -OSA: continue CPAP qhs -CAD: distant past, continue ASA and statin -Preserved EF heart failure: has significant LE edema (low albumin may be contributing as well), but no pulmonary complaints; holding anti-HTN due to AKI and bradycardia; consider diuresis in AM after a time of clinical stability (did have elevated lactic acid at initial presentation) -Hyperglycemia: rapid acting correction insulin given steroid induced  hyperglycemia  DVT prophylaxis: [place] hep Code Status: full Disposition Plan: Anticipate D/C home in 1-2 days Consults called: none Admission status: admit to hospital medicine   Cheri Rous, MD [facility] Page:[phone]  If 7PM-7AM, please contact night-coverage [email or web address] Password [email or web address]

## 2017-10-28 ENCOUNTER — Ambulatory Visit: Payer: Medicare Other | Admitting: Physician Assistant

## 2017-10-28 DIAGNOSIS — N179 Acute kidney failure, unspecified: Secondary | ICD-10-CM

## 2017-10-28 DIAGNOSIS — E440 Moderate protein-calorie malnutrition: Secondary | ICD-10-CM | POA: Diagnosis not present

## 2017-10-28 DIAGNOSIS — L03115 Cellulitis of right lower limb: Secondary | ICD-10-CM | POA: Diagnosis not present

## 2017-10-28 LAB — BASIC METABOLIC PANEL
ANION GAP: 8 (ref 5–15)
BUN: 15 mg/dL (ref 6–20)
CALCIUM: 7.8 mg/dL — AB (ref 8.9–10.3)
CO2: 25 mmol/L (ref 22–32)
Chloride: 109 mmol/L (ref 101–111)
Creatinine, Ser: 0.92 mg/dL (ref 0.44–1.00)
GFR calc Af Amer: >60 mL/min (ref >60)
GFR calc non Af Amer: 60 mL/min — ABNORMAL LOW (ref >60)
GLUCOSE: 88 mg/dL (ref 65–99)
Potassium: 4.1 mmol/L (ref 3.5–5.1)
Sodium: 142 mmol/L (ref 135–145)

## 2017-10-28 LAB — CBC
HCT: 31.2 % — ABNORMAL LOW (ref 36.0–46.0)
Hemoglobin: 9.3 g/dL — ABNORMAL LOW (ref 12.0–15.0)
MCH: 26.3 pg (ref 26.0–34.0)
MCHC: 29.8 g/dL — ABNORMAL LOW (ref 30.0–36.0)
MCV: 88.1 fL (ref 78.0–100.0)
Platelets: 157 10*3/uL (ref 150–400)
RBC: 3.54 MIL/uL — ABNORMAL LOW (ref 3.87–5.11)
RDW: 21.2 % — AB (ref 11.5–15.5)
WBC: 6 10*3/uL (ref 4.0–10.5)

## 2017-10-28 LAB — GLUCOSE, CAPILLARY
GLUCOSE-CAPILLARY: 151 mg/dL — AB (ref 65–99)
GLUCOSE-CAPILLARY: 198 mg/dL — AB (ref 65–99)
Glucose-Capillary: 80 mg/dL (ref 65–99)
Glucose-Capillary: 95 mg/dL (ref 65–99)

## 2017-10-28 MED ORDER — FUROSEMIDE 40 MG PO TABS
20.0000 mg | ORAL_TABLET | Freq: Every day | ORAL | Status: DC
  Administered 2017-10-28: 20 mg via ORAL
  Filled 2017-10-28 (×2): qty 1

## 2017-10-28 NOTE — Progress Notes (Signed)
Progress Note    Isabella [name]  EPP:[id number] DOB: [birth year]  DOA: 10/26/2017 PCP: [name], MD    Brief Narrative:    Medical records reviewed and are as summarized below:  Isabella [name] is an 77 y.o. female with medical problems including obesity, ulcerative colitis on multi agent regimen including prednisone, obstructive sleep apnea on nightly CPAP, coronary artery disease status post stent over 2 years ago, chronic diastolic heart failure who presents with right lower extremity redness.  Of note, the patient was admitted in May 2019 with an ulcerative colitis flare, since then she's been maintained on prednisone. At one time she is on 60 mg now titrated down to 30 mg daily. She reports struggling with bilateral lower extremity edema that has been relatively stable. However, she reports over the course the past week developing worsening redness of her right lower extremity that is culminated in 2 regions that have been draining fluid. She reports MAXIMUM TEMPERATURE of 100.2 Fahrenheit at home in addition to having tachycardia above 120 per her report. She denies syncope, presyncope, chest pain, shortness of breath, nausea, vomiting, blood in stool. She reports her bowel movement frequency is one to 2 times daily. She is retired and has a daughter that lives next door.     Assessment/Plan:   Active Problems:   Cellulitis  Cellulitis of right lower extremity in setting of immunosuppression On prednisone + immunosuppressives for her UC. Found to be lymphopenic. IV vanc, responding some-- continue to monitor overnight and if improved/afebrile, plan to d/c in AM with PO abx  AKI:  -improved -suspect due to infection  UC: resume home meds; continue current prednisone dose (30 mg) and mesalamine  OSA: continue CPAP qhs  CAD: distant past, continue ASA and statin  Preserved EF heart failure: has significant LE edema (low albumin may be contributing as well), but no  pulmonary complaints -resume home lasix  Hyperglycemia: rapid acting correction insulin given steroid/infection induced hyperglycemia  obesity Body mass index is 36.66 kg/m.   Family Communication/Anticipated D/C date and plan/Code Status   DVT prophylaxis: heparin Code Status: Full Code.  Family Communication: none Disposition Plan: home in AM?   Medical Consultants:    None.    Subjective:   No SOB, no CP Has not been out of bed  Objective:    Vitals:   10/27/17 1833 10/27/17 2106 10/28/17 0436 10/28/17 1000  BP: 137/72 129/75 (!) 149/89 134/73  Pulse: 65 72 (!) 57 72  Resp: 16 16 15    Temp: 98.5 F (36.9 C) 98.3 F (36.8 C) 97.9 F (36.6 C) 98.2 F (36.8 C)  TempSrc: Oral Oral Oral Oral  SpO2: 99% 99% 97% 97%  Weight:  88 kg (194 lb 0.1 oz)    Height:        Intake/Output Summary (Last 24 hours) at 10/28/2017 1403 Last data filed at 10/28/2017 1100 Gross per 24 hour  Intake 103 ml  Output 650 ml  Net -547 ml   Filed Weights   10/27/17 1520 10/27/17 2106  Weight: 92.2 kg (203 lb 4.2 oz) 88 kg (194 lb 0.1 oz)    Exam: In bed, NAD Right lower ext with decreasing redness but still with 2 draining foci No increased work of breathing  Data Reviewed:   I have personally reviewed following labs and imaging studies:  Labs: Labs show the following:   Basic Metabolic Panel: Recent Labs  Lab 10/26/17 2028 10/27/17 0423 10/28/17 0701  NA  136 144 142  K 3.8 3.2* 4.1  CL 101 107 109  CO2 24 28 25   GLUCOSE 202* 113* 88  BUN 23* 18 15  CREATININE 1.51* 1.24* 0.92  CALCIUM 8.1* 8.2* 7.8*   GFR Estimated Creatinine Clearance: 54.1 mL/min (by C-G formula based on SCr of 0.92 mg/dL). Liver Function Tests: Recent Labs  Lab 10/26/17 2028  AST 34  ALT 27  ALKPHOS 62  BILITOT 0.5  PROT 5.5*  ALBUMIN 2.8*   No results for input(s): LIPASE, AMYLASE in the last 168 hours. No results for input(s): AMMONIA in the last 168 hours. Coagulation  profile No results for input(s): INR, PROTIME in the last 168 hours.  CBC: Recent Labs  Lab 10/26/17 2028 10/27/17 0423 10/28/17 0701  WBC 7.3 5.8 6.0  NEUTROABS 6.6  --   --   HGB 10.1* 9.4* 9.3*  HCT 32.8* 30.9* 31.2*  MCV 87.0 88.0 88.1  PLT 205 176 157   Cardiac Enzymes: No results for input(s): CKTOTAL, CKMB, CKMBINDEX, TROPONINI in the last 168 hours. BNP (last 3 results) No results for input(s): PROBNP in the last 8760 hours. CBG: Recent Labs  Lab 10/27/17 1350 10/27/17 1712 10/27/17 2226 10/28/17 0800 10/28/17 1145  GLUCAP 164* 135* 157* 80 95   D-Dimer: No results for input(s): DDIMER in the last 72 hours. Hgb A1c: No results for input(s): HGBA1C in the last 72 hours. Lipid Profile: No results for input(s): CHOL, HDL, LDLCALC, TRIG, CHOLHDL, LDLDIRECT in the last 72 hours. Thyroid function studies: No results for input(s): TSH, T4TOTAL, T3FREE, THYROIDAB in the last 72 hours.  Invalid input(s): FREET3 Anemia work up: No results for input(s): VITAMINB12, FOLATE, FERRITIN, TIBC, IRON, RETICCTPCT in the last 72 hours. Sepsis Labs: Recent Labs  Lab 10/26/17 2028 10/26/17 2034 10/26/17 2324 10/27/17 0423 10/28/17 0701  WBC 7.3  --   --  5.8 6.0  LATICACIDVEN  --  2.22* 1.22  --   --     Microbiology No results found for this or any previous visit (from the past 240 hour(s)).  Procedures and diagnostic studies:  No results found.  Medications:   . atorvastatin  40 mg Oral QHS  . famotidine  20 mg Oral BID  . heparin  5,000 Units Subcutaneous Q8H  . insulin aspart  0-9 Units Subcutaneous TID WC  . mesalamine  1,500 mg Oral Daily  . multivitamin with minerals  1 tablet Oral Q lunch  . prednisoLONE acetate  1 drop Left Eye BID  . predniSONE  30 mg Oral Q breakfast  . sodium chloride flush  3 mL Intravenous Q12H  . Tofacitinib Citrate  10 mg Oral BID   Continuous Infusions: . vancomycin Stopped (10/28/17 0527)     LOS: 0 days   Geradine Girt  [facility]   *Please refer to [email or web address], password [email or web address] to get updated schedule on who will round on this patient, as hospitalists switch teams weekly. If 7PM-7AM, please contact night-coverage at [email or web address], password [email or web address] for any overnight needs.  10/28/2017, 2:03 PM

## 2017-10-28 NOTE — Progress Notes (Signed)
Initial Nutrition Assessment  DOCUMENTATION CODES:   Non-severe (moderate) malnutrition in context of acute illness/injury, Obesity unspecified  INTERVENTION:   - Provided pt with Inflammatory Bowel Disease: Ulcerative Colitis Nutrition Therapy from the [employer] Diet[employer]  - Discussed handout and answered pt's questions regarding nutrition therapy for IBD  - Encourage PO intake  - Continue MVI with minerals daily  NUTRITION DIAGNOSIS:   Moderate Malnutrition related to acute illness, decreased appetite(ulcerative colitis flare) as evidenced by mild muscle depletion, percent weight loss(8.3% weight loss in 2 months).  GOAL:   Patient will meet greater than or equal to 90% of their needs  MONITOR:   PO intake, Labs, Weight trends, I & O's, Skin  REASON FOR ASSESSMENT:   Malnutrition Screening Tool    ASSESSMENT:   77 year old female who presented to the ED on 10/26/17 with fluid overload in lower extremities and cellulitis in right lower extremity. Pt is on prednisone and immune suppressants for ulcerative colitis. PMH significant for obstructive sleep apnea on nightly CPAP, CAD s/p stent over 2 years ago, and CHF. Pt was admitted in May 2019 with an ulcerative colitis flare.  Spoke with pt at bedside who was in good spirits. RN at bedside at time of RD visit. Pt states that her ulcerative colitis is "much better." Pt reports that she has had "no hunger sensations in months." Pt believes that her poor appetite is related to the number of medications she has to take.  Pt reports losing 30 lbs over the last year, stating that her UBW is 230 lbs. Per weight history in chart, pt has lost 18 lbs since April 2019. This is an 8.3% weight loss in 2 months which is significant for timeframe. Suspect pt has had even more weight loss than is reflected in her current weight due to current fluid overload in bilateral lower extremities.  Pt reports that she typically eats 3  meals daily and has snacks between meals. A typical breakfast includes oatmeal, milk, and coffee. A typical lunch includes eggs and grits. A typical dinner includes chicken, noodles, and green beans. Pt reports snacking on [place] yogurt. Pt states that her lunch was "very good," reporting having chicken and dumplings. RD noted tray in room with ~90% meal completion.  Discussed nutrition therapy for IBD and UC in detail with pt. Pt very appreciative of education and handout.  Medications reviewed and include: 20 mg Pepcid BID, sliding scale Novolog, MVI with minerals daily, Prednisone daily  Labs reviewed: calcium 7.8 (L), hemoglobin 9.3 (L), HCT 31.2 (L) CBG's: 95, 80, 157, 135, 164 x 24 hours  NUTRITION - FOCUSED PHYSICAL EXAM:    Most Recent Value  Orbital Region  No depletion  Upper Arm Region  No depletion  Thoracic and Lumbar Region  No depletion  Buccal Region  No depletion  Temple Region  No depletion  Clavicle Bone Region  No depletion  Clavicle and Acromion Bone Region  No depletion  Scapular Bone Region  Unable to assess  Dorsal Hand  Mild depletion  Patellar Region  Mild depletion  Anterior Thigh Region  Mild depletion  Posterior Calf Region  Mild depletion  Edema (RD Assessment)  Severe [bilateral lower extremities]  Hair  Reviewed  Eyes  Reviewed  Mouth  Reviewed  Skin  Reviewed [scattered ecchymosis on bilateral upper extremities]  Nails  Reviewed       Diet Order:   Diet Order  Diet regular Room service appropriate? Yes; Fluid consistency: Thin  Diet effective now          EDUCATION NEEDS:   Education needs have been addressed  Skin:  Skin Assessment: Skin Integrity Issues: Skin Integrity Issues:: Other (Comment) Other: cellulitis to R and L legs  Last BM:  10/26/17  Height:   Ht Readings from Last 1 Encounters:  10/27/17 5' 1"  (1.549 m)    Weight:   Wt Readings from Last 1 Encounters:  10/27/17 194 lb 0.1 oz (88 kg)    Ideal Body  Weight:  47.7 kg  BMI:  Body mass index is 36.66 kg/m.  Estimated Nutritional Needs:   Kcal:  1500-1700 kcal/day  Protein:  85-100 grams/day  Fluid:  >/= 1.5 L/day    Gaynell Face, MS, RD, LDN Pager: [phone] Weekend/After Hours: [phone]

## 2017-10-29 DIAGNOSIS — E440 Moderate protein-calorie malnutrition: Secondary | ICD-10-CM

## 2017-10-29 DIAGNOSIS — N179 Acute kidney failure, unspecified: Secondary | ICD-10-CM | POA: Diagnosis not present

## 2017-10-29 DIAGNOSIS — L03115 Cellulitis of right lower limb: Secondary | ICD-10-CM | POA: Diagnosis not present

## 2017-10-29 LAB — CBC
HCT: 31 % — ABNORMAL LOW (ref 36.0–46.0)
HEMOGLOBIN: 9.4 g/dL — AB (ref 12.0–15.0)
MCH: 26.6 pg (ref 26.0–34.0)
MCHC: 30.3 g/dL (ref 30.0–36.0)
MCV: 87.6 fL (ref 78.0–100.0)
Platelets: 177 10*3/uL (ref 150–400)
RBC: 3.54 MIL/uL — AB (ref 3.87–5.11)
RDW: 21.2 % — ABNORMAL HIGH (ref 11.5–15.5)
WBC: 6.7 10*3/uL (ref 4.0–10.5)

## 2017-10-29 LAB — BASIC METABOLIC PANEL
Anion gap: 6 (ref 5–15)
BUN: 13 mg/dL (ref 6–20)
CO2: 29 mmol/L (ref 22–32)
Calcium: 8 mg/dL — ABNORMAL LOW (ref 8.9–10.3)
Chloride: 109 mmol/L (ref 101–111)
Creatinine, Ser: 0.82 mg/dL (ref 0.44–1.00)
GFR calc Af Amer: >60 mL/min (ref >60)
GFR calc non Af Amer: >60 mL/min (ref >60)
Glucose, Bld: 87 mg/dL (ref 65–99)
POTASSIUM: 3.8 mmol/L (ref 3.5–5.1)
SODIUM: 144 mmol/L (ref 135–145)

## 2017-10-29 LAB — GLUCOSE, CAPILLARY
GLUCOSE-CAPILLARY: 78 mg/dL (ref 65–99)
Glucose-Capillary: 160 mg/dL — ABNORMAL HIGH (ref 65–99)

## 2017-10-29 MED ORDER — METOPROLOL TARTRATE 25 MG PO TABS
25.0000 mg | ORAL_TABLET | Freq: Two times a day (BID) | ORAL | Status: DC
  Administered 2017-10-29: 25 mg via ORAL
  Filled 2017-10-29: qty 1

## 2017-10-29 MED ORDER — DOXYCYCLINE HYCLATE 100 MG PO TABS
100.0000 mg | ORAL_TABLET | Freq: Two times a day (BID) | ORAL | Status: DC
  Administered 2017-10-29: 100 mg via ORAL
  Filled 2017-10-29: qty 1

## 2017-10-29 MED ORDER — PREDNISONE 20 MG PO TABS: 30.0000 mg | ORAL_TABLET | Freq: Every day | ORAL | Status: DC

## 2017-10-29 MED ORDER — DOXYCYCLINE HYCLATE 100 MG PO TABS: 100.0000 mg | ORAL_TABLET | Freq: Two times a day (BID) | ORAL | 0 refills | Status: DC

## 2017-10-29 NOTE — Discharge Instructions (Signed)

## 2017-10-29 NOTE — Discharge Summary (Addendum)
Physician Discharge Summary  Isabella [name] [facility] TAV:[id number] DOB: [birth year] DOA: 10/26/2017  PCP: [name], MD  Admit date: 10/26/2017 Discharge date: 10/29/2017  Admitted From: home Discharge disposition: home   Recommendations for Outpatient Follow-Up:   1. Close follow up to ensure resolution of symptoms 2. bMP 1 week 3. Elevate extremities   Discharge Diagnosis:   Active Problems:   Cellulitis   Malnutrition of moderate degree    Discharge Condition: Improved.  Diet recommendation: Low sodium, heart healthy.  Carbohydrate-modified.  Wound care: None.  Code status: Full.   History of Present Illness:   77 year old woman with medical problems including obesity, ulcerative colitis on multi agent regimen including prednisone, obstructive sleep apnea on nightly CPAP, coronary artery disease status post stent over 2 years ago, chronic diastolic heart failure who presents with right lower extremity redness.  Of note, the patient was admitted in May 2019 with an ulcerative colitis flare, since then she's been maintained on prednisone. At one time she is on 60 mg now titrated down to 30 mg daily. She reports struggling with bilateral lower extremity edema that has been relatively stable. However, she reports over the course the past week developing worsening redness of her right lower extremity that is culminated in 2 regions that have been draining fluid. She reports MAXIMUM TEMPERATURE of 100.2 Fahrenheit at home in addition to having tachycardia above 120 per her report. She denies syncope, presyncope, chest pain, shortness of breath, nausea, vomiting, blood in stool. She reports her bowel movement frequency is one to 2 times daily. She is retired and has a daughter that lives next door. She reports being independent in her ADLs and IADLs. She endorses single-prong cane. She cites multiple surgeries in the past including low back surgery. She takes Lasix daily 40 mg, was  evaluated by outpatient team about a week and half ago where her dose was increased for a few days which did not say help her lower shin edema. She denies orthopnea or paroxysmal nocturnal dyspnea, she reports her weight has been stable.     Hospital Course by Problem:   Cellulitis of right lower extremity in setting of immunosuppression On prednisone + immunosuppressives for her UC. Found to be lymphopenic. IV vanc-- initially slow to respond but today with great improvement-- plan to d/c on PO abx and close PCP follow up to ensure resolution -information about when to return to the ER/contact PCP for worsening given  AKI:  -improved -suspect due to infection  UC: resume home meds; continue current prednisone dose (30 mg) and mesalamine  OSA: continue CPAP qhs  CAD: distant past, continue ASA and statin  Preserved EF heart failure: has significant LE edema (low albumin may be contributing as well), but no pulmonary complaints -resume home lasix  Hyperglycemia: rapid acting correction insulin given steroid/infection induced hyperglycemia -much improved and I do not think she needs agents upon d/c Close PCP follow up  obesity Body mass index is 36.66 kg/m.  Malnutrition of moderate degree -supplements    Medical Consultants:      Discharge Exam:   Vitals:   10/29/17 0552 10/29/17 0800  BP: (!) 141/86 (!) 146/79  Pulse: 63 65  Resp: 14 18  Temp: 98.7 F (37.1 C) 98.7 F (37.1 C)  SpO2: 95% 95%   Vitals:   10/28/17 1600 10/28/17 2031 10/29/17 0552 10/29/17 0800  BP: (!) 143/91 (!) 133/98 (!) 141/86 (!) 146/79  Pulse: 92 78 63 65  Resp:  16 14 18   Temp: (!) 97.5 F (36.4 C) 98 F (36.7 C) 98.7 F (37.1 C) 98.7 F (37.1 C)  TempSrc: Oral Oral Oral Oral  SpO2:  97% 95% 95%  Weight:  88 kg (194 lb 0.1 oz)    Height:        General exam: Appears calm and comfortable. Cellulitis much improved   The results of significant diagnostics from this  hospitalization (including imaging, microbiology, ancillary and laboratory) are listed below for reference.     Procedures and Diagnostic Studies:   No results found.   Labs:   Basic Metabolic Panel: Recent Labs  Lab 10/26/17 2028 10/27/17 0423 10/28/17 0701 10/29/17 0636  NA 136 144 142 144  K 3.8 3.2* 4.1 3.8  CL 101 107 109 109  CO2 24 28 25 29   GLUCOSE 202* 113* 88 87  BUN 23* 18 15 13   CREATININE 1.51* 1.24* 0.92 0.82  CALCIUM 8.1* 8.2* 7.8* 8.0*   GFR Estimated Creatinine Clearance: 60.7 mL/min (by C-G formula based on SCr of 0.82 mg/dL). Liver Function Tests: Recent Labs  Lab 10/26/17 2028  AST 34  ALT 27  ALKPHOS 62  BILITOT 0.5  PROT 5.5*  ALBUMIN 2.8*   No results for input(s): LIPASE, AMYLASE in the last 168 hours. No results for input(s): AMMONIA in the last 168 hours. Coagulation profile No results for input(s): INR, PROTIME in the last 168 hours.  CBC: Recent Labs  Lab 10/26/17 2028 10/27/17 0423 10/28/17 0701 10/29/17 0636  WBC 7.3 5.8 6.0 6.7  NEUTROABS 6.6  --   --   --   HGB 10.1* 9.4* 9.3* 9.4*  HCT 32.8* 30.9* 31.2* 31.0*  MCV 87.0 88.0 88.1 87.6  PLT 205 176 157 177   Cardiac Enzymes: No results for input(s): CKTOTAL, CKMB, CKMBINDEX, TROPONINI in the last 168 hours. BNP: Invalid input(s): POCBNP CBG: Recent Labs  Lab 10/28/17 0800 10/28/17 1145 10/28/17 1654 10/28/17 2057 10/29/17 0800  GLUCAP 80 95 151* 198* 78   D-Dimer No results for input(s): DDIMER in the last 72 hours. Hgb A1c No results for input(s): HGBA1C in the last 72 hours. Lipid Profile No results for input(s): CHOL, HDL, LDLCALC, TRIG, CHOLHDL, LDLDIRECT in the last 72 hours. Thyroid function studies No results for input(s): TSH, T4TOTAL, T3FREE, THYROIDAB in the last 72 hours.  Invalid input(s): FREET3 Anemia work up No results for input(s): VITAMINB12, FOLATE, FERRITIN, TIBC, IRON, RETICCTPCT in the last 72 hours. Microbiology Recent Results  (from the past 240 hour(s))  Blood culture (routine x 2)     Status: None (Preliminary result)   Collection Time: 10/26/17 11:09 PM  Result Value Ref Range Status   Specimen Description BLOOD LEFT ANTECUBITAL  Final   Special Requests   Final    BOTTLES DRAWN AEROBIC AND ANAEROBIC Blood Culture adequate volume   Culture   Final    NO GROWTH 1 DAY Performed at [facility], [address] [place], [place] [address]    Report Status PENDING  Incomplete  Blood culture (routine x 2)     Status: None (Preliminary result)   Collection Time: 10/26/17 11:23 PM  Result Value Ref Range Status   Specimen Description BLOOD RIGHT ANTECUBITAL  Final   Special Requests   Final    BOTTLES DRAWN AEROBIC AND ANAEROBIC Blood Culture results may not be optimal due to an excessive volume of blood received in culture bottles   Culture   Final  NO GROWTH 1 DAY Performed at [facility], [place] [address] [place], [place] [address]    Report Status PENDING  Incomplete     Discharge Instructions:   Discharge Instructions    Diet - low sodium heart healthy   Complete by:  As directed    Discharge instructions   Complete by:  As directed    Resume prednisone as previously directed Elevate extremities Return to PCP/ER asap for worsening redness/pain   Increase activity slowly   Complete by:  As directed      Allergies as of 10/29/2017      Reactions   Imodium A-d [loperamide Hcl] Shortness Of Breath, Other (See Comments)   Caused a pain & "squeezing" sensation in the lungs also   Penicillins Hives, Shortness Of Breath   Reaction at 77 years old Has patient had a PCN reaction causing immediate rash, facial/tongue/throat swelling, SOB or lightheadedness with hypotension: Yes Has patient had a PCN reaction causing severe rash involving mucus membranes or skin necrosis: No Has patient had a PCN reaction that required hospitalization No Has patient had a PCN reaction occurring within the  last 10 years: No If all of the above answers are "NO", then may proceed with Cephalosporin use.   Tramadol Shortness Of Breath, Other (See Comments)   Per patient "felt like lungs were being squeezed"   Zestril [lisinopril] Cough   Hornet Venom Swelling   Neomycin Swelling, Other (See Comments)   Reaction to eye ointment - eyes became swollen shut Makes wounds ooze   Adhesive [tape] Rash   Codeine Other (See Comments)   Made back hurt   Erythromycin Diarrhea   Hydromorphone Other (See Comments)   Severe Lethargy    Percocet [oxycodone-acetaminophen] Other (See Comments)   Confusion      Medication List    STOP taking these medications   nystatin 100000 UNIT/ML suspension Commonly known as:  MYCOSTATIN     TAKE these medications   acetaminophen 500 MG tablet Commonly known as:  TYLENOL Take 500 mg by mouth every 6 (six) hours as needed for mild pain.   CENTRUM SILVER 50+WOMEN Tabs Take 1 tablet by mouth daily with lunch.   chlorpheniramine 4 MG tablet Commonly known as:  CHLOR-TRIMETON Take 8 mg by mouth at bedtime.   doxycycline 100 MG tablet Commonly known as:  VIBRA-TABS Take 1 tablet (100 mg total) by mouth every 12 (twelve) hours.   famotidine 20 MG tablet Commonly known as:  PEPCID Take 1 tablet (20 mg total) by mouth 2 (two) times daily.   furosemide 20 MG tablet Commonly known as:  LASIX Take 20 mg by mouth daily.   LIPITOR 40 MG tablet Generic drug:  atorvastatin Take 40 mg by mouth at bedtime.   losartan 50 MG tablet Commonly known as:  COZAAR Take 50 mg by mouth daily.   mesalamine 0.375 g 24 hr capsule Commonly known as:  APRISO Take 1,500 mg by mouth daily.   metoprolol tartrate 25 MG tablet Commonly known as:  LOPRESSOR Take 25 mg by mouth 2 (two) times daily.   nitroGLYCERIN 0.4 MG SL tablet Commonly known as:  NITROSTAT Place 0.4 mg under the tongue every 5 (five) minutes as needed for chest pain.   potassium chloride SA 20 MEQ  tablet Commonly known as:  K-DUR,KLOR-CON Take 20 mEq by mouth daily.   prednisoLONE acetate 1 % ophthalmic suspension Commonly known as:  PRED FORTE Place 1 drop into the left  eye 2 (two) times daily.   predniSONE 20 MG tablet Commonly known as:  DELTASONE Take 1.5 tablets (30 mg total) by mouth daily with breakfast.   UNABLE TO FIND CPAP: At bedtime nightly and during all naps   XELJANZ 10 MG Tabs Generic drug:  Tofacitinib Citrate Take 10 mg by mouth 2 (two) times daily.            Durable Medical Equipment  (From admission, onward)        Start     Ordered   10/27/17 0159  For home use only DME continuous positive airway pressure (CPAP)  Once    Question Answer Comment  Patient has OSA or probable OSA Yes   Settings Other see comments   CPAP supplies needed Mask, headgear, cushions, filters, heated tubing and water chamber      10/27/17 0158     Follow-up Information    [name], MD Follow up in 1 week(s).   Specialty:  Family Medicine Why:  for monitoring of cellulitis Contact information: [place] [address] [phone]        Belva Crome, MD .   Specialty:  Cardiology Contact information: [phone] [place] [address] [phone]            Time coordinating discharge: 35 min  Signed:  Geradine Girt  [facility] 10/29/2017, 8:49 AM

## 2017-10-29 NOTE — Progress Notes (Signed)
Isabella [name] Gathers to be D/C'd Home per MD order.  Discussed prescriptions and follow up appointments with the patient. Prescriptions given to patient, medication list explained in detail. Pt verbalized understanding.  Allergies as of 10/29/2017      Reactions   Imodium A-d [loperamide Hcl] Shortness Of Breath, Other (See Comments)   Caused a pain & "squeezing" sensation in the lungs also   Penicillins Hives, Shortness Of Breath   Reaction at 77 years old Has patient had a PCN reaction causing immediate rash, facial/tongue/throat swelling, SOB or lightheadedness with hypotension: Yes Has patient had a PCN reaction causing severe rash involving mucus membranes or skin necrosis: No Has patient had a PCN reaction that required hospitalization No Has patient had a PCN reaction occurring within the last 10 years: No If all of the above answers are "NO", then may proceed with Cephalosporin use.   Tramadol Shortness Of Breath, Other (See Comments)   Per patient "felt like lungs were being squeezed"   Zestril [lisinopril] Cough   Hornet Venom Swelling   Neomycin Swelling, Other (See Comments)   Reaction to eye ointment - eyes became swollen shut Makes wounds ooze   Adhesive [tape] Rash   Codeine Other (See Comments)   Made back hurt   Erythromycin Diarrhea   Hydromorphone Other (See Comments)   Severe Lethargy    Percocet [oxycodone-acetaminophen] Other (See Comments)   Confusion      Medication List    STOP taking these medications   nystatin 100000 UNIT/ML suspension Commonly known as:  MYCOSTATIN     TAKE these medications   acetaminophen 500 MG tablet Commonly known as:  TYLENOL Take 500 mg by mouth every 6 (six) hours as needed for mild pain.   CENTRUM SILVER 50+WOMEN Tabs Take 1 tablet by mouth daily with lunch.   chlorpheniramine 4 MG tablet Commonly known as:  CHLOR-TRIMETON Take 8 mg by mouth at bedtime.   doxycycline 100 MG tablet Commonly known as:  VIBRA-TABS Take 1  tablet (100 mg total) by mouth every 12 (twelve) hours.   famotidine 20 MG tablet Commonly known as:  PEPCID Take 1 tablet (20 mg total) by mouth 2 (two) times daily.   furosemide 20 MG tablet Commonly known as:  LASIX Take 20 mg by mouth daily.   LIPITOR 40 MG tablet Generic drug:  atorvastatin Take 40 mg by mouth at bedtime.   losartan 50 MG tablet Commonly known as:  COZAAR Take 50 mg by mouth daily.   mesalamine 0.375 g 24 hr capsule Commonly known as:  APRISO Take 1,500 mg by mouth daily.   metoprolol tartrate 25 MG tablet Commonly known as:  LOPRESSOR Take 25 mg by mouth 2 (two) times daily.   nitroGLYCERIN 0.4 MG SL tablet Commonly known as:  NITROSTAT Place 0.4 mg under the tongue every 5 (five) minutes as needed for chest pain.   potassium chloride SA 20 MEQ tablet Commonly known as:  K-DUR,KLOR-CON Take 20 mEq by mouth daily.   prednisoLONE acetate 1 % ophthalmic suspension Commonly known as:  PRED FORTE Place 1 drop into the left eye 2 (two) times daily.   predniSONE 20 MG tablet Commonly known as:  DELTASONE Take 1.5 tablets (30 mg total) by mouth daily with breakfast.   UNABLE TO FIND CPAP: At bedtime nightly and during all naps   XELJANZ 10 MG Tabs Generic drug:  Tofacitinib Citrate Take 10 mg by mouth 2 (two) times daily.  Durable Medical Equipment  (From admission, onward)        Start     Ordered   10/27/17 0159  For home use only DME continuous positive airway pressure (CPAP)  Once    Question Answer Comment  Patient has OSA or probable OSA Yes   Settings Other see comments   CPAP supplies needed Mask, headgear, cushions, filters, heated tubing and water chamber      10/27/17 0158      Vitals:   10/29/17 0552 10/29/17 0800  BP: (!) 141/86 (!) 146/79  Pulse: 63 65  Resp: 14 18  Temp: 98.7 F (37.1 C) 98.7 F (37.1 C)  SpO2: 95% 95%    Skin clean, dry and intact without evidence of skin break down, no evidence  of skin tears noted. IV catheter discontinued intact. Site without signs and symptoms of complications. Dressing and pressure applied. Pt denies pain at this time. No complaints noted.  An After Visit Summary was printed and given to the patient. Patient escorted via [place], and D/C home via private auto.  Dixie Dials RN, BSN

## 2017-11-01 LAB — CULTURE, BLOOD (ROUTINE X 2)
CULTURE: NO GROWTH
CULTURE: NO GROWTH
Special Requests: ADEQUATE

## 2017-12-10 ENCOUNTER — Ambulatory Visit: Payer: Medicare Other | Admitting: Physician Assistant

## 2017-12-10 ENCOUNTER — Encounter: Payer: Self-pay | Admitting: Physician Assistant

## 2017-12-10 VITALS — BP 132/82 | HR 59 | Ht 61.0 in | Wt 208.0 lb

## 2017-12-10 DIAGNOSIS — E669 Obesity, unspecified: Secondary | ICD-10-CM

## 2017-12-10 DIAGNOSIS — E782 Mixed hyperlipidemia: Secondary | ICD-10-CM

## 2017-12-10 DIAGNOSIS — I5032 Chronic diastolic (congestive) heart failure: Secondary | ICD-10-CM | POA: Diagnosis not present

## 2017-12-10 DIAGNOSIS — I2510 Atherosclerotic heart disease of native coronary artery without angina pectoris: Secondary | ICD-10-CM | POA: Diagnosis not present

## 2017-12-10 DIAGNOSIS — I10 Essential (primary) hypertension: Secondary | ICD-10-CM | POA: Diagnosis not present

## 2017-12-10 NOTE — Progress Notes (Signed)
Cardiology Office Note    Date:  12/10/2017   ID:  Isabella, [name] Aug 13, 1940, MRN [id number]  PCP:  Isabella Austin, MD  Cardiologist: Isabella Grooms, MD  Chief Complaint  Patient presents with  . Follow-up    History of Present Illness:  Isabella [name] is a 77 y.o. female with history of STEMI in 2012 treated with DES to the RCA x2 proximal for catheter induced dissection distal to treat acute lesion. Also has hypertension and HLD and history of DVT. Saw Dr. Tamala [name] 06/12/2017 at which time she had been dealing with complications from ulcerative colitis. Was not having any angina. Was having right lower extremity swelling. Lower extremity Doppler showed acute superficial thrombosis of the small saphenous vein but no deep vein thrombosis.    Patient was discharged from the hospital 10/05/2017 after an ulcerative colitis flare. Lasix was held in the hospital because of diarrhea and volume loss.  She was also started on amlodipine due to elevated blood pressures resume losartan and stopped beta-blocker.  Last echo 2012 normal LV function EF 55 to 60% with grade 1 DD.   I saw the patient 10/15/2017 at which time amlodipine was stopped by PCP and metoprolol restarted.  She did have some improvement in edema but was still on high-dose prednisone.  She had significant edema.  I increased her Lasix to 80 mg daily for 3 days potassium as well then back to 40 mg daily.  2D echo 10/15/2017 showed normal LVEF 60 to 65% with grade 1 DD.  Patient was discharged from the hospital 10/29/2017 after admission and with cellulitis of her right lower extremity in the setting of immunosuppression.  She was treated with IV vancomycin and was slow to respond.  She also had acute kidney injury suspected secondary to infection with creatinine up to 1.5 but normalized to 0.82 at discharge.  She comes in today feeling much better.  She says her swelling is down a lot.  Her colitis has improved as well.  She had blood work by  Dr. Darcus [name] on Monday.  She does get extra salt in her diet but says she is going to do better.  Past Medical History:  Diagnosis Date  . Arthritis   . Complication of anesthesia    Took a while to wake up with knee  . Coronary artery disease   . Dyspnea    with walking at times  . Fallen bladder   . GERD (gastroesophageal reflux disease)   . 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 Headache    migraines in her younger years  . Hemorrhoids   . High cholesterol   . History of blood transfusion   . Hx of adenomatous colonic polyps   . Hypertension   . Lung nodule   . Myocardial infarct ([facility]) 03/24/2011   Dr. Tamala [name]  . Nocturnal leg cramps   . Obesity   . Sarcoidosis   . Sleep apnea    uses cpap    Past Surgical History:  Procedure Laterality Date  . ABDOMINAL HYSTERECTOMY  1990  . APPENDECTOMY  1970  . BACK SURGERY  2015   lower back  . CARDIAC CATHETERIZATION  03/2011  . CARPAL TUNNEL RELEASE Bilateral 2004  . CATARACT EXTRACTION    . CHOLECYSTECTOMY  1970  . COLONOSCOPY W/ BIOPSIES AND POLYPECTOMY    . CORONARY ANGIOPLASTY  2012  . CORONARY STENT PLACEMENT  03/24/2011   Dr. Tamala [name]  . EYE SURGERY  2008,2011   corneal transplant  . FEMORAL ARTERY EXPLORATION  03/25/2011   Procedure: FEMORAL ARTERY EXPLORATION;  Surgeon: Elam Dutch, MD;  Location: [place] [facility];  Service: Vascular;  Laterality: Right;  Evacuation of Hematoma   . hematoma surgery     from cardiac cath -hematoma in groin-right  . HERNIA REPAIR  9381   umbilical  . JOINT REPLACEMENT    . KNEE ARTHROSCOPY  2006   Right  . [place] [facility] Left Eye  2012  . LEFT HEART CATHETERIZATION WITH CORONARY ANGIOGRAM N/A 03/24/2011   Procedure: LEFT HEART CATHETERIZATION WITH CORONARY ANGIOGRAM;  Surgeon: Isabella Grooms, MD;  Location: [facility] CATH LAB;  Service: Cardiovascular;  Laterality: N/A;  . PERCUTANEOUS CORONARY STENT INTERVENTION  (PCI-S) N/A 03/24/2011   Procedure: PERCUTANEOUS CORONARY STENT INTERVENTION (PCI-S);  Surgeon: Isabella Grooms, MD;  Location: [facility] CATH LAB;  Service: Cardiovascular;  Laterality: N/A;  . SHOULDER SURGERY  2010  . TONSILLECTOMY  1949  . TONSILLECTOMY AND ADENOIDECTOMY    . TOTAL KNEE ARTHROPLASTY  07/2010   Left  . TOTAL KNEE ARTHROPLASTY  06/17/2012   Procedure: TOTAL KNEE ARTHROPLASTY;  Surgeon: Gearlean Alf, MD;  Location: [facility];  Service: Orthopedics;  Laterality: Right;  . TUBAL LIGATION  1978  . [place], 2007   x 2  . VIDEO BRONCHOSCOPY WITH ENDOBRONCHIAL NAVIGATION  04/13/2012   Procedure: VIDEO BRONCHOSCOPY WITH ENDOBRONCHIAL NAVIGATION;  Surgeon: Melrose Nakayama, MD;  Location: [place];  Service: Thoracic;  Laterality: N/A;  . VIDEO BRONCHOSCOPY WITH ENDOBRONCHIAL NAVIGATION N/A 04/08/2016   Procedure: VIDEO BRONCHOSCOPY WITH ENDOBRONCHIAL NAVIGATION;  Surgeon: Rigoberto Noel, MD;  Location: [facility] OR;  Service: Thoracic;  Laterality: N/A;    Current Medications: Current Meds  Medication Sig  . acetaminophen (TYLENOL) 500 MG tablet Take 500 mg by mouth every 6 (six) hours as needed for mild pain.  Marland Kitchen atorvastatin (LIPITOR) 40 MG tablet Take 40 mg by mouth at bedtime.  . chlorpheniramine (CHLOR-TRIMETON) 4 MG tablet Take 8 mg by mouth at bedtime.   . furosemide (LASIX) 20 MG tablet Take 40 mg by mouth daily.   Marland Kitchen losartan (COZAAR) 50 MG tablet Take 50 mg by mouth daily.   . mesalamine (APRISO) 0.375 g 24 hr capsule Take 1,500 mg by mouth daily.  . metoprolol tartrate (LOPRESSOR) 25 MG tablet Take 25 mg by mouth 2 (two) times daily.  . Multiple Vitamins-Minerals (CENTRUM SILVER 50+WOMEN) TABS Take 1 tablet by mouth daily with lunch.   . nitroGLYCERIN (NITROSTAT) 0.4 MG SL tablet Place 0.4 mg under the tongue every 5 (five) minutes as needed for chest pain.  . potassium chloride SA (K-DUR,KLOR-CON) 20 MEQ tablet Take 20 mEq by mouth daily.  . prednisoLONE  acetate (PRED FORTE) 1 % ophthalmic suspension Place 1 drop into the left eye daily.  . Tofacitinib Citrate (XELJANZ) 10 MG TABS Take 10 mg by mouth 2 (two) times daily.  Marland Kitchen UNABLE TO FIND CPAP: At bedtime nightly and during all naps     Allergies:   Imodium a-d [loperamide hcl]; Penicillins; Tramadol; Zestril [lisinopril]; Hornet venom; Neomycin; Adhesive [tape]; Codeine; Erythromycin; Hydromorphone; and Percocet [oxycodone-acetaminophen]   Social History   Socioeconomic History  . Marital status: Widowed    Spouse name: Not on file  . Number of children: Not on file  . Years of education:  Not on file  . Highest education level: Not on file  Occupational History  . Occupation: Retired    Fish farm manager: RETIRED  Social Needs  . Financial resource strain: Not on file  . Food insecurity:    Worry: Not on file    Inability: Not on file  . Transportation needs:    Medical: Not on file    Non-medical: Not on file  Tobacco Use  . Smoking status: Never Smoker  . Smokeless tobacco: Never Used  Substance and Sexual Activity  . Alcohol use: No  . Drug use: No  . Sexual activity: Not on file  Lifestyle  . Physical activity:    Days per week: Not on file    Minutes per session: Not on file  . Stress: Not on file  Relationships  . Social connections:    Talks on phone: Not on file    Gets together: Not on file    Attends religious service: Not on file    Active member of club or organization: Not on file    Attends meetings of clubs or organizations: Not on file    Relationship status: Not on file  Other Topics Concern  . Not on file  Social History Narrative   Tobacco use cigarettes: never smoked, tobacco history last updated 12/23/2013. No smoking. No alcohol. Caffeine: yes, coffee, 3 servings daily.recreational drug use: never. No diet. Exercise: walks everyday. Occupation: retired. Marital status: married. Children: boys, 2 girls, 2. Seat belt use: yes.     Family History:  The  patient's family history includes Breast cancer in her maternal grandmother; Cancer in her father; Hypertension in her mother.   ROS:   Please see the history of present illness.    Review of Systems  Constitution: Negative.  HENT: Negative.   Eyes: Negative.   Cardiovascular: Positive for leg swelling.  Respiratory: Negative.   Hematologic/Lymphatic: Negative.   Musculoskeletal: Negative.  Negative for joint pain.  Gastrointestinal: Negative.   Genitourinary: Negative.   Neurological: Negative.    All other systems reviewed and are negative.   PHYSICAL EXAM:   VS:  BP 132/82   Pulse (!) 59   Ht 5' 1"  (1.549 m)   Wt 208 lb (94.3 kg)   SpO2 98%   BMI 39.30 kg/m   Physical Exam  GEN: Obese, in no acute distress  Neck: no JVD, carotid bruits, or masses Cardiac:RRR; no murmurs, rubs, or gallops  Respiratory:  clear to auscultation bilaterally, normal work of breathing GI: soft, nontender, nondistended, + BS Ext: +2-3 edema bilaterally decreased distal pulses bilaterally Neuro:  Alert and Oriented x 3,  Psych: euthymic mood, full affect  Wt Readings from Last 3 Encounters:  12/10/17 208 lb (94.3 kg)  10/28/17 194 lb 0.1 oz (88 kg)  10/15/17 204 lb (92.5 kg)      Studies/Labs Reviewed:   EKG:  EKG is not ordered today.   Recent Labs: 09/30/2017: Magnesium 2.0 10/26/2017: ALT 27 10/29/2017: BUN 13; Creatinine, Ser 0.82; Hemoglobin 9.4; Platelets 177; Potassium 3.8; Sodium 144   Lipid Panel    Component Value Date/Time   CHOL 179 03/24/2011 1742   TRIG 105 03/24/2011 1742   HDL 73 03/24/2011 1742   CHOLHDL 2.5 03/24/2011 1742   VLDL 21 03/24/2011 1742   LDLCALC 85 03/24/2011 1742    Additional studies/ records that were reviewed today include:  2D echo 6/5/2019Study Conclusions   - Left ventricle: The cavity size was normal. There was moderate  focal basal and mild concentric hypertrophy. Systolic function   was normal. The estimated ejection fraction was in  the range of   60% to 65%. Wall motion was normal; there were no regional wall   motion abnormalities. There was an increased relative   contribution of atrial contraction to ventricular filling.   Doppler parameters are consistent with abnormal left ventricular   relaxation (grade 1 diastolic dysfunction). - Atrial septum: There was increased thickness of the septum,   consistent with lipomatous hypertrophy. - Pulmonary arteries: Systolic pressure could not be accurately   estimated.       ASSESSMENT:    1. Chronic diastolic CHF (congestive heart failure) ([place])   2. Atherosclerosis of native coronary artery of native heart without angina pectoris   3. Essential hypertension   4. Mixed hyperlipidemia   5. Obesity (BMI 30-39.9)      PLAN:  In order of problems listed above:  Chronic diastolic CHF recent 2D echo in June 2019 normal LVEF grade 1 DD-patient with chronic edema but improved since I last saw her.  Taking Lasix 40 mg once daily.  Lab work done by PCP on Monday and will try to get the results.  Cannot wear compression stockings but she does wrap her legs.  Follow-up with Dr. Tamala [name] in November.  2 g sodium diet.  CAD status post STEMI in 2012 treated with DES to the RCA x2 with proximal catheter induced dissection distal to treated acute lesion no angina.  Needs a refill on nitroglycerin  Hypertension blood pressure stable on losartan and metoprolol  Mixed hyperlipidemia on Lipitor LDL 56 11/2016  Obesity weight loss would help her overall health.    Medication Adjustments/Labs and Tests Ordered: Current medicines are reviewed at length with the patient today.  Concerns regarding medicines are outlined above.  Medication changes, Labs and Tests ordered today are listed in the Patient Instructions below. Patient Instructions   Medication Instructions: Your physician recommends that you continue on your current medications as directed. Please refer to the Current  Medication list given to you today.   Labwork: None Ordered  Procedures/Testing: None Ordered  Follow-Up: Your physician recommends that you schedule a follow-up appointment in: November with Dr. Tamala [name]    Any Additional Special Instructions Will Be Listed Below (If Applicable).  Two Gram Sodium Diet 2000 mg  What is Sodium? Sodium is a mineral found naturally in many foods. The most significant source of sodium in the diet is table salt, which is about 40% sodium.  Processed, convenience, and preserved foods also contain a large amount of sodium.  The body needs only 500 mg of sodium daily to function,  A normal diet provides more than enough sodium even if you do not use salt.  Why Limit Sodium? A build up of sodium in the body can cause thirst, increased blood pressure, shortness of breath, and water retention.  Decreasing sodium in the diet can reduce edema and risk of heart attack or stroke associated with high blood pressure.  Keep in mind that there are many other factors involved in these health problems.  Heredity, obesity, lack of exercise, cigarette smoking, stress and what you eat all play a role.  General Guidelines:  Do not add salt at the table or in cooking.  One teaspoon of salt contains over 2 grams of sodium.  Read food labels  Avoid processed and convenience foods  Ask your dietitian before eating any foods not dicussed in the menu planning  guidelines  Consult your physician if you wish to use a salt substitute or a sodium containing medication such as antacids.  Limit milk and milk products to 16 oz (2 cups) per day.  Shopping Hints:  READ LABELS!! "Dietetic" does not necessarily mean low sodium.  Salt and other sodium ingredients are often added to foods during processing.   Menu Planning Guidelines Food Group Choose More Often Avoid  Beverages (see also the milk group All fruit juices, low-sodium, salt-free vegetables juices, low-sodium carbonated  beverages Regular vegetable or tomato juices, commercially softened water used for drinking or cooking  Breads and Cereals Enriched white, wheat, rye and pumpernickel bread, hard rolls and dinner rolls; muffins, cornbread and waffles; most dry cereals, cooked cereal without added salt; unsalted crackers and breadsticks; low sodium or homemade bread crumbs Bread, rolls and crackers with salted tops; quick breads; instant hot cereals; pancakes; commercial bread stuffing; self-rising flower and biscuit mixes; regular bread crumbs or cracker crumbs  Desserts and Sweets Desserts and sweets mad with mild should be within allowance Instant pudding mixes and cake mixes  Fats Butter or margarine; vegetable oils; unsalted salad dressings, regular salad dressings limited to 1 Tbs; light, sour and heavy cream Regular salad dressings containing bacon fat, bacon bits, and salt pork; snack dips made with instant soup mixes or processed cheese; salted nuts  Fruits Most fresh, frozen and canned fruits Fruits processed with salt or sodium-containing ingredient (some dried fruits are processed with sodium sulfites        Vegetables Fresh, frozen vegetables and low- sodium canned vegetables Regular canned vegetables, sauerkraut, pickled vegetables, and others prepared in brine; frozen vegetables in sauces; vegetables seasoned with ham, bacon or salt pork  Condiments, Sauces, Miscellaneous  Salt substitute with physician's approval; pepper, herbs, spices; vinegar, lemon or lime juice; hot pepper sauce; garlic powder, onion powder, low sodium soy sauce (1 Tbs.); low sodium condiments (ketchup, chili sauce, mustard) in limited amounts (1 tsp.) fresh ground horseradish; unsalted tortilla chips, pretzels, potato chips, popcorn, salsa (1/4 cup) Any seasoning made with salt including garlic salt, celery salt, onion salt, and seasoned salt; sea salt, rock salt, kosher salt; meat tenderizers; monosodium glutamate; mustard, regular  soy sauce, barbecue, sauce, chili sauce, teriyaki sauce, steak sauce, Worcestershire sauce, and most flavored vinegars; canned gravy and mixes; regular condiments; salted snack foods, olives, picles, relish, horseradish sauce, catsup   Food preparation: Try these seasonings Meats:    Pork Sage, onion Serve with applesauce  Chicken Poultry seasoning, thyme, parsley Serve with cranberry sauce  Lamb Curry powder, rosemary, garlic, thyme Serve with mint sauce or jelly  Veal Marjoram, basil Serve with current jelly, cranberry sauce  Beef Pepper, bay leaf Serve with dry mustard, unsalted chive butter  Fish [place] leaf, dill Serve with unsalted lemon butter, unsalted parsley butter  Vegetables:    Asparagus Lemon juice   Broccoli Lemon juice   Carrots Mustard dressing parsley, mint, nutmeg, glazed with unsalted butter and sugar   Green beans Marjoram, lemon juice, nutmeg,dill seed   Tomatoes Basil, marjoram, onion   Spice /blend for [employer]" 4 tsp ground thyme 1 tsp ground sage 3 tsp ground rosemary 4 tsp ground marjoram   Test your knowledge 1. A product that says "Salt Free" may still contain sodium. True or False 2. Garlic Powder and Hot Pepper Sauce an be used as alternative seasonings.True or False 3. Processed foods have more sodium than fresh foods.  True or False 4. Canned Vegetables have  less sodium than froze True or False  WAYS TO DECREASE YOUR SODIUM INTAKE 1. Avoid the use of added salt in cooking and at the table.  Table salt (and other prepared seasonings which contain salt) is probably one of the greatest sources of sodium in the diet.  Unsalted foods can gain flavor from the sweet, sour, and butter taste sensations of herbs and spices.  Instead of using salt for seasoning, try the following seasonings with the foods listed.  Remember: how you use them to enhance natural food flavors is limited only by your creativity... Allspice-Meat, fish, eggs, fruit, peas, red and yellow  vegetables Almond Extract-Fruit baked goods Anise Seed-Sweet breads, fruit, carrots, beets, cottage cheese, cookies (tastes like licorice) Basil-Meat, fish, eggs, vegetables, rice, vegetables salads, soups, sauces Bay Leaf-Meat, fish, stews, poultry Burnet-Salad, vegetables (cucumber-like flavor) Caraway Seed-Bread, cookies, cottage cheese, meat, vegetables, cheese, rice Cardamon-Baked goods, fruit, soups Celery Powder or seed-Salads, salad dressings, sauces, meatloaf, soup, bread.Do not use  celery salt Chervil-Meats, salads, fish, eggs, vegetables, cottage cheese (parsley-like flavor) Chili Power-Meatloaf, chicken cheese, corn, eggplant, egg dishes Chives-Salads cottage cheese, egg dishes, soups, vegetables, sauces Cilantro-Salsa, casseroles Cinnamon-Baked goods, fruit, pork, lamb, chicken, carrots Cloves-Fruit, baked goods, fish, pot roast, green beans, beets, carrots Coriander-Pastry, cookies, meat, salads, cheese (lemon-orange flavor) Cumin-Meatloaf, fish,cheese, eggs, cabbage,fruit pie (caraway flavor) [employer], fruit, eggs, fish, poultry, cottage cheese, vegetables Dill Seed-Meat, cottage cheese, poultry, vegetables, fish, salads, bread Fennel Seed-Bread, cookies, apples, pork, eggs, fish, beets, cabbage, cheese, Licorice-like flavor Garlic-(buds or powder) Salads, meat, poultry, fish, bread, butter, vegetables, potatoes.Do not  use garlic salt Ginger-Fruit, vegetables, baked goods, meat, fish, poultry Horseradish Root-Meet, vegetables, butter Lemon Juice or Extract-Vegetables, fruit, tea, baked goods, fish salads Mace-Baked goods fruit, vegetables, fish, poultry (taste like nutmeg) Maple Extract-Syrups Marjoram-Meat, chicken, fish, vegetables, breads, green salads (taste like Sage) Mint-Tea, lamb, sherbet, vegetables, desserts, carrots, cabbage Mustard, Dry or Seed-Cheese, eggs, meats, vegetables, poultry Nutmeg-Baked goods, fruit, chicken, eggs, vegetables,  desserts Onion Powder-Meat, fish, poultry, vegetables, cheese, eggs, bread, rice salads (Do not use   Onion salt) Orange Extract-Desserts, baked goods Oregano-Pasta, eggs, cheese, onions, pork, lamb, fish, chicken, vegetables, green salads Paprika-Meat, fish, poultry, eggs, cheese, vegetables Parsley Flakes-Butter, vegetables, meat fish, poultry, eggs, bread, salads (certain forms may   Contain sodium Pepper-Meat fish, poultry, vegetables, eggs Peppermint Extract-Desserts, baked goods Poppy Seed-Eggs, bread, cheese, fruit dressings, baked goods, noodles, vegetables, cottage  [employer], poultry, meat, fish, cauliflower, turnips,eggs bread Saffron-Rice, bread, veal, chicken, fish, eggs Sage-Meat, fish, poultry, onions, eggplant, tomateos, pork, stews Savory-Eggs, salads, poultry, meat, rice, vegetables, soups, pork Tarragon-Meat, poultry, fish, eggs, butter, vegetables (licorice-like flavor)  Thyme-Meat, poultry, fish, eggs, vegetables, (clover-like flavor), sauces, soups Tumeric-Salads, butter, eggs, fish, rice, vegetables (saffron-like flavor) Vanilla Extract-Baked goods, candy Vinegar-Salads, vegetables, meat marinades Walnut Extract-baked goods, candy  2. Choose your Foods Wisely   The following is a list of foods to avoid which are high in sodium:  Meats-Avoid all smoked, canned, salt cured, dried and kosher meat and fish as well as Anchovies   [employer] meats:Bologna, Liverwurst, Pastrami Canned meat or fish  Marinated herring Caviar    Pepperoni Corned Beef   Pizza Dried chipped beef  Salami Frozen breaded fish or meat Salt pork Frankfurters or hot dogs  Sardines Gefilte fish   Sausage Ham (boiled ham, Proscuitto Smoked butt    spiced ham)   Spam      TV Dinners Vegetables Canned vegetables (Regular)  Relish Canned mushrooms  Sauerkraut Olives    Tomato juice Pickles  Bakery and Dessert Products Canned  puddings  Cream pies Cheesecake   Decorated cakes Cookies  Beverages/Juices Tomato juice, regular  Gatorade   V-8 vegetable juice, regular  Breads and Cereals Biscuit mixes   Salted potato chips, corn chips, pretzels Bread stuffing mixes  Salted crackers and rolls Pancake and waffle mixes Self-rising flour  Seasonings Accent    Meat sauces Barbecue sauce  Meat tenderizer Catsup    Monosodium glutamate (MSG) Celery salt   Onion salt Chili sauce   Prepared mustard Garlic salt   Salt, seasoned salt, sea salt Gravy mixes   Soy sauce Horseradish   Steak sauce Ketchup   Tartar sauce Lite salt    Teriyaki sauce Marinade mixes   Worcestershire sauce  Others Baking powder   Cocoa and cocoa mixes Baking soda   Commercial casserole mixes Candy-caramels, chocolate  Dehydrated soups    Bars, fudge,nougats  Instant rice and pasta mixes Canned broth or soup  Maraschino cherries Cheese, aged and processed cheese and cheese spreads  Learning Assessment Quiz  Indicated T (for True) or F (for False) for each of the following statements:  1. _____ Fresh fruits and vegetables and unprocessed grains are generally low in sodium 2. _____ Water may contain a considerable amount of sodium, depending on the source 3. _____ You can always tell if a food is high in sodium by tasting it 4. _____ Certain laxatives my be high in sodium and should be avoided unless prescribed   by a physician or pharmacist 5. _____ Salt substitutes may be used freely by anyone on a sodium restricted diet 6. _____ Sodium is present in table salt, food additives and as a natural component of   most foods 7. _____ Table salt is approximately 90% sodium 8. _____ Limiting sodium intake may help prevent excess fluid accumulation in the body 9. _____ On a sodium-restricted diet, seasonings such as bouillon soy sauce, and    cooking wine should be used in place of table salt 10. _____ On an ingredient list, a product which  lists monosodium glutamate as the first   ingredient is an appropriate food to include on a low sodium diet  Circle the best answer(s) to the following statements (Hint: there may be more than one correct answer)  11. On a low-sodium diet, some acceptable snack items are:    A. Olives  F. Bean dip   K. Grapefruit juice    B. Salted Pretzels G. Commercial Popcorn   L. Canned peaches    C. Carrot Sticks  H. Bouillon   M. Unsalted nuts   D. [place] fries  I. Peanut butter crackers N. Salami   E. Sweet pickles J. Tomato Juice   O. Pizza  12.  Seasonings that may be used freely on a reduced - sodium diet include   A. Lemon wedges F.Monosodium glutamate K. Celery seed    B.Soysauce   G. Pepper   L. Mustard powder   C. Sea salt  H. Cooking wine  M. Onion flakes   D. Vinegar  E. Prepared horseradish N. Salsa   E. Sage   J. Worcestershire sauce  O. Chutney    If you need a refill on your cardiac medications before your next appointment, please call your pharmacy.      Sumner Boast, PA-C  12/10/2017 12:57 PM    [facility]  [address] [place], [place]  [address] Phone: [phone]; Fax: [phone]

## 2017-12-10 NOTE — Patient Instructions (Addendum)
Medication Instructions: Your physician recommends that you continue on your current medications as directed. Please refer to the Current Medication list given to you today.   Labwork: None Ordered  Procedures/Testing: None Ordered  Follow-Up: Your physician recommends that you schedule a follow-up appointment in: November with Dr. [name]    Any Additional Special Instructions Will Be Listed Below (If Applicable).  Two Gram Sodium Diet 2000 mg  What is Sodium? Sodium is a mineral found naturally in many foods. The most significant source of sodium in the diet is table salt, which is about 40% sodium.  Processed, convenience, and preserved foods also contain a large amount of sodium.  The body needs only 500 mg of sodium daily to function,  A normal diet provides more than enough sodium even if you do not use salt.  Why Limit Sodium? A build up of sodium in the body can cause thirst, increased blood pressure, shortness of breath, and water retention.  Decreasing sodium in the diet can reduce edema and risk of heart attack or stroke associated with high blood pressure.  Keep in mind that there are many other factors involved in these health problems.  Heredity, obesity, lack of exercise, cigarette smoking, stress and what you eat all play a role.  General Guidelines:  Do not add salt at the table or in cooking.  One teaspoon of salt contains over 2 grams of sodium.  Read food labels  Avoid processed and convenience foods  Ask your dietitian before eating any foods not dicussed in the menu planning guidelines  Consult your physician if you wish to use a salt substitute or a sodium containing medication such as antacids.  Limit milk and milk products to 16 oz (2 cups) per day.  Shopping Hints:  READ LABELS!! "Dietetic" does not necessarily mean low sodium.  Salt and other sodium ingredients are often added to foods during processing.   Menu Planning Guidelines Food Group Choose  More Often Avoid  Beverages (see also the milk group All fruit juices, low-sodium, salt-free vegetables juices, low-sodium carbonated beverages Regular vegetable or tomato juices, commercially softened water used for drinking or cooking  Breads and Cereals Enriched white, wheat, rye and pumpernickel bread, hard rolls and dinner rolls; muffins, cornbread and waffles; most dry cereals, cooked cereal without added salt; unsalted crackers and breadsticks; low sodium or homemade bread crumbs Bread, rolls and crackers with salted tops; quick breads; instant hot cereals; pancakes; commercial bread stuffing; self-rising flower and biscuit mixes; regular bread crumbs or cracker crumbs  Desserts and Sweets Desserts and sweets mad with mild should be within allowance Instant pudding mixes and cake mixes  Fats Butter or margarine; vegetable oils; unsalted salad dressings, regular salad dressings limited to 1 Tbs; light, sour and heavy cream Regular salad dressings containing bacon fat, bacon bits, and salt pork; snack dips made with instant soup mixes or processed cheese; salted nuts  Fruits Most fresh, frozen and canned fruits Fruits processed with salt or sodium-containing ingredient (some dried fruits are processed with sodium sulfites        Vegetables Fresh, frozen vegetables and low- sodium canned vegetables Regular canned vegetables, sauerkraut, pickled vegetables, and others prepared in brine; frozen vegetables in sauces; vegetables seasoned with ham, bacon or salt pork  Condiments, Sauces, Miscellaneous  Salt substitute with physician's approval; pepper, herbs, spices; vinegar, lemon or lime juice; hot pepper sauce; garlic powder, onion powder, low sodium soy sauce (1 Tbs.); low sodium condiments (ketchup, chili sauce, mustard) in  limited amounts (1 tsp.) fresh ground horseradish; unsalted tortilla chips, pretzels, potato chips, popcorn, salsa (1/4 cup) Any seasoning made with salt including garlic salt,  celery salt, onion salt, and seasoned salt; sea salt, rock salt, kosher salt; meat tenderizers; monosodium glutamate; mustard, regular soy sauce, barbecue, sauce, chili sauce, teriyaki sauce, steak sauce, Worcestershire sauce, and most flavored vinegars; canned gravy and mixes; regular condiments; salted snack foods, olives, picles, relish, horseradish sauce, catsup   Food preparation: Try these seasonings Meats:    Pork Sage, onion Serve with applesauce  Chicken Poultry seasoning, thyme, parsley Serve with cranberry sauce  Lamb Curry powder, rosemary, garlic, thyme Serve with mint sauce or jelly  Veal Marjoram, basil Serve with current jelly, cranberry sauce  Beef Pepper, bay leaf Serve with dry mustard, unsalted chive butter  Fish [place] leaf, dill Serve with unsalted lemon butter, unsalted parsley butter  Vegetables:    Asparagus Lemon juice   Broccoli Lemon juice   Carrots Mustard dressing parsley, mint, nutmeg, glazed with unsalted butter and sugar   Green beans Marjoram, lemon juice, nutmeg,dill seed   Tomatoes Basil, marjoram, onion   Spice /blend for [employer]" 4 tsp ground thyme 1 tsp ground sage 3 tsp ground rosemary 4 tsp ground marjoram   Test your knowledge 1. A product that says "Salt Free" may still contain sodium. True or False 2. Garlic Powder and Hot Pepper Sauce an be used as alternative seasonings.True or False 3. Processed foods have more sodium than fresh foods.  True or False 4. Canned Vegetables have less sodium than froze True or False  WAYS TO DECREASE YOUR SODIUM INTAKE 1. Avoid the use of added salt in cooking and at the table.  Table salt (and other prepared seasonings which contain salt) is probably one of the greatest sources of sodium in the diet.  Unsalted foods can gain flavor from the sweet, sour, and butter taste sensations of herbs and spices.  Instead of using salt for seasoning, try the following seasonings with the foods listed.  Remember: how you  use them to enhance natural food flavors is limited only by your creativity... Allspice-Meat, fish, eggs, fruit, peas, red and yellow vegetables Almond Extract-Fruit baked goods Anise Seed-Sweet breads, fruit, carrots, beets, cottage cheese, cookies (tastes like licorice) Basil-Meat, fish, eggs, vegetables, rice, vegetables salads, soups, sauces Bay Leaf-Meat, fish, stews, poultry Burnet-Salad, vegetables (cucumber-like flavor) Caraway Seed-Bread, cookies, cottage cheese, meat, vegetables, cheese, rice Cardamon-Baked goods, fruit, soups Celery Powder or seed-Salads, salad dressings, sauces, meatloaf, soup, bread.Do not use  celery salt Chervil-Meats, salads, fish, eggs, vegetables, cottage cheese (parsley-like flavor) Chili Power-Meatloaf, chicken cheese, corn, eggplant, egg dishes Chives-Salads cottage cheese, egg dishes, soups, vegetables, sauces Cilantro-Salsa, casseroles Cinnamon-Baked goods, fruit, pork, lamb, chicken, carrots Cloves-Fruit, baked goods, fish, pot roast, green beans, beets, carrots Coriander-Pastry, cookies, meat, salads, cheese (lemon-orange flavor) Cumin-Meatloaf, fish,cheese, eggs, cabbage,fruit pie (caraway flavor) [employer], fruit, eggs, fish, poultry, cottage cheese, vegetables Dill Seed-Meat, cottage cheese, poultry, vegetables, fish, salads, bread Fennel Seed-Bread, cookies, apples, pork, eggs, fish, beets, cabbage, cheese, Licorice-like flavor Garlic-(buds or powder) Salads, meat, poultry, fish, bread, butter, vegetables, potatoes.Do not  use garlic salt Ginger-Fruit, vegetables, baked goods, meat, fish, poultry Horseradish Root-Meet, vegetables, butter Lemon Juice or Extract-Vegetables, fruit, tea, baked goods, fish salads Mace-Baked goods fruit, vegetables, fish, poultry (taste like nutmeg) Maple Extract-Syrups Marjoram-Meat, chicken, fish, vegetables, breads, green salads (taste like Sage) Mint-Tea, lamb, sherbet, vegetables, desserts, carrots,  cabbage Mustard, Dry or Seed-Cheese, eggs, meats, vegetables, poultry  Nutmeg-Baked goods, fruit, chicken, eggs, vegetables, desserts Onion Powder-Meat, fish, poultry, vegetables, cheese, eggs, bread, rice salads (Do not use   Onion salt) Orange Extract-Desserts, baked goods Oregano-Pasta, eggs, cheese, onions, pork, lamb, fish, chicken, vegetables, green salads Paprika-Meat, fish, poultry, eggs, cheese, vegetables Parsley Flakes-Butter, vegetables, meat fish, poultry, eggs, bread, salads (certain forms may   Contain sodium Pepper-Meat fish, poultry, vegetables, eggs Peppermint Extract-Desserts, baked goods Poppy Seed-Eggs, bread, cheese, fruit dressings, baked goods, noodles, vegetables, cottage  [employer], poultry, meat, fish, cauliflower, turnips,eggs bread Saffron-Rice, bread, veal, chicken, fish, eggs Sage-Meat, fish, poultry, onions, eggplant, tomateos, pork, stews Savory-Eggs, salads, poultry, meat, rice, vegetables, soups, pork Tarragon-Meat, poultry, fish, eggs, butter, vegetables (licorice-like flavor)  Thyme-Meat, poultry, fish, eggs, vegetables, (clover-like flavor), sauces, soups Tumeric-Salads, butter, eggs, fish, rice, vegetables (saffron-like flavor) Vanilla Extract-Baked goods, candy Vinegar-Salads, vegetables, meat marinades Walnut Extract-baked goods, candy  2. Choose your Foods Wisely   The following is a list of foods to avoid which are high in sodium:  Meats-Avoid all smoked, canned, salt cured, dried and kosher meat and fish as well as Anchovies   [employer] meats:Bologna, Liverwurst, Pastrami Canned meat or fish  Marinated herring Caviar    Pepperoni Corned Beef   Pizza Dried chipped beef  Salami Frozen breaded fish or meat Salt pork Frankfurters or hot dogs  Sardines Gefilte fish   Sausage Ham (boiled ham, Proscuitto Smoked butt    spiced ham)   Spam      TV Dinners Vegetables Canned vegetables  (Regular) Relish Canned mushrooms  Sauerkraut Olives    Tomato juice Pickles  Bakery and Dessert Products Canned puddings  Cream pies Cheesecake   Decorated cakes Cookies  Beverages/Juices Tomato juice, regular  Gatorade   V-8 vegetable juice, regular  Breads and Cereals Biscuit mixes   Salted potato chips, corn chips, pretzels Bread stuffing mixes  Salted crackers and rolls Pancake and waffle mixes Self-rising flour  Seasonings Accent    Meat sauces Barbecue sauce  Meat tenderizer Catsup    Monosodium glutamate (MSG) Celery salt   Onion salt Chili sauce   Prepared mustard Garlic salt   Salt, seasoned salt, sea salt Gravy mixes   Soy sauce Horseradish   Steak sauce Ketchup   Tartar sauce Lite salt    Teriyaki sauce Marinade mixes   Worcestershire sauce  Others Baking powder   Cocoa and cocoa mixes Baking soda   Commercial casserole mixes Candy-caramels, chocolate  Dehydrated soups    Bars, fudge,nougats  Instant rice and pasta mixes Canned broth or soup  Maraschino cherries Cheese, aged and processed cheese and cheese spreads  Learning Assessment Quiz  Indicated T (for True) or F (for False) for each of the following statements:  1. _____ Fresh fruits and vegetables and unprocessed grains are generally low in sodium 2. _____ Water may contain a considerable amount of sodium, depending on the source 3. _____ You can always tell if a food is high in sodium by tasting it 4. _____ Certain laxatives my be high in sodium and should be avoided unless prescribed   by a physician or pharmacist 5. _____ Salt substitutes may be used freely by anyone on a sodium restricted diet 6. _____ Sodium is present in table salt, food additives and as a natural component of   most foods 7. _____ Table salt is approximately 90% sodium 8. _____ Limiting sodium intake may help prevent excess fluid accumulation in the body 9. _____  On a sodium-restricted diet, seasonings such as bouillon soy  sauce, and    cooking wine should be used in place of table salt 10. _____ On an ingredient list, a product which lists monosodium glutamate as the first   ingredient is an appropriate food to include on a low sodium diet  Circle the best answer(s) to the following statements (Hint: there may be more than one correct answer)  11. On a low-sodium diet, some acceptable snack items are:    A. Olives  F. Bean dip   K. Grapefruit juice    B. Salted Pretzels G. Commercial Popcorn   L. Canned peaches    C. Carrot Sticks  H. Bouillon   M. Unsalted nuts   D. [place] fries  I. Peanut butter crackers N. Salami   E. Sweet pickles J. Tomato Juice   O. Pizza  12.  Seasonings that may be used freely on a reduced - sodium diet include   A. Lemon wedges F.Monosodium glutamate K. Celery seed    B.Soysauce   G. Pepper   L. Mustard powder   C. Sea salt  H. Cooking wine  M. Onion flakes   D. Vinegar  E. Prepared horseradish N. Salsa   E. Sage   J. Worcestershire sauce  O. Chutney    If you need a refill on your cardiac medications before your next appointment, please call your pharmacy.

## 2017-12-12 ENCOUNTER — Other Ambulatory Visit: Payer: Self-pay

## 2017-12-12 MED ORDER — NITROGLYCERIN 0.4 MG SL SUBL: 0.4000 mg | SUBLINGUAL_TABLET | SUBLINGUAL | 5 refills | Status: DC | PRN

## 2018-01-27 ENCOUNTER — Emergency Department (HOSPITAL_COMMUNITY): Payer: Medicare Other

## 2018-01-27 ENCOUNTER — Inpatient Hospital Stay (HOSPITAL_COMMUNITY): Payer: Medicare Other

## 2018-01-27 ENCOUNTER — Inpatient Hospital Stay (HOSPITAL_COMMUNITY)
Admission: EM | Admit: 2018-01-27 | Discharge: 2018-01-29 | DRG: 280 | Disposition: A | Discharge status: Home / Self Care | Payer: Medicare Other | Attending: Cardiology | Admitting: Cardiology

## 2018-01-27 ENCOUNTER — Encounter (HOSPITAL_COMMUNITY): Payer: Self-pay | Admitting: Emergency Medicine

## 2018-01-27 ENCOUNTER — Encounter (HOSPITAL_COMMUNITY)
Admission: EM | Disposition: A | Discharge status: Home / Self Care | Payer: Self-pay | Source: Home / Self Care | Attending: Cardiology

## 2018-01-27 DIAGNOSIS — Z79899 Other long term (current) drug therapy: Secondary | ICD-10-CM

## 2018-01-27 DIAGNOSIS — E7800 Pure hypercholesterolemia, unspecified: Secondary | ICD-10-CM | POA: Diagnosis present

## 2018-01-27 DIAGNOSIS — I252 Old myocardial infarction: Secondary | ICD-10-CM

## 2018-01-27 DIAGNOSIS — K219 Gastro-esophageal reflux disease without esophagitis: Secondary | ICD-10-CM | POA: Diagnosis present

## 2018-01-27 DIAGNOSIS — I2542 Coronary artery dissection: Secondary | ICD-10-CM | POA: Diagnosis present

## 2018-01-27 DIAGNOSIS — I340 Nonrheumatic mitral (valve) insufficiency: Secondary | ICD-10-CM

## 2018-01-27 DIAGNOSIS — G4730 Sleep apnea, unspecified: Secondary | ICD-10-CM | POA: Diagnosis present

## 2018-01-27 DIAGNOSIS — I110 Hypertensive heart disease with heart failure: Secondary | ICD-10-CM | POA: Diagnosis present

## 2018-01-27 DIAGNOSIS — Z86718 Personal history of other venous thrombosis and embolism: Secondary | ICD-10-CM | POA: Diagnosis not present

## 2018-01-27 DIAGNOSIS — I214 Non-ST elevation (NSTEMI) myocardial infarction: Secondary | ICD-10-CM | POA: Diagnosis present

## 2018-01-27 DIAGNOSIS — E669 Obesity, unspecified: Secondary | ICD-10-CM | POA: Diagnosis present

## 2018-01-27 DIAGNOSIS — M1710 Unilateral primary osteoarthritis, unspecified knee: Secondary | ICD-10-CM | POA: Diagnosis present

## 2018-01-27 DIAGNOSIS — Z955 Presence of coronary angioplasty implant and graft: Secondary | ICD-10-CM | POA: Diagnosis not present

## 2018-01-27 DIAGNOSIS — K5190 Ulcerative colitis, unspecified, without complications: Secondary | ICD-10-CM | POA: Diagnosis present

## 2018-01-27 DIAGNOSIS — Z880 Allergy status to penicillin: Secondary | ICD-10-CM

## 2018-01-27 DIAGNOSIS — D649 Anemia, unspecified: Secondary | ICD-10-CM | POA: Diagnosis present

## 2018-01-27 DIAGNOSIS — Z7982 Long term (current) use of aspirin: Secondary | ICD-10-CM | POA: Diagnosis not present

## 2018-01-27 DIAGNOSIS — Z23 Encounter for immunization: Secondary | ICD-10-CM

## 2018-01-27 DIAGNOSIS — Z96653 Presence of artificial knee joint, bilateral: Secondary | ICD-10-CM | POA: Diagnosis present

## 2018-01-27 DIAGNOSIS — E785 Hyperlipidemia, unspecified: Secondary | ICD-10-CM | POA: Diagnosis present

## 2018-01-27 DIAGNOSIS — Z885 Allergy status to narcotic agent status: Secondary | ICD-10-CM

## 2018-01-27 DIAGNOSIS — I2510 Atherosclerotic heart disease of native coronary artery without angina pectoris: Secondary | ICD-10-CM

## 2018-01-27 DIAGNOSIS — I890 Lymphedema, not elsewhere classified: Secondary | ICD-10-CM | POA: Diagnosis present

## 2018-01-27 DIAGNOSIS — Z888 Allergy status to other drugs, medicaments and biological substances status: Secondary | ICD-10-CM

## 2018-01-27 DIAGNOSIS — I5033 Acute on chronic diastolic (congestive) heart failure: Secondary | ICD-10-CM | POA: Diagnosis present

## 2018-01-27 DIAGNOSIS — M179 Osteoarthritis of knee, unspecified: Secondary | ICD-10-CM | POA: Diagnosis present

## 2018-01-27 DIAGNOSIS — D869 Sarcoidosis, unspecified: Secondary | ICD-10-CM | POA: Diagnosis present

## 2018-01-27 DIAGNOSIS — R21 Rash and other nonspecific skin eruption: Secondary | ICD-10-CM | POA: Diagnosis not present

## 2018-01-27 DIAGNOSIS — I5031 Acute diastolic (congestive) heart failure: Secondary | ICD-10-CM | POA: Diagnosis present

## 2018-01-27 DIAGNOSIS — I10 Essential (primary) hypertension: Secondary | ICD-10-CM | POA: Diagnosis present

## 2018-01-27 DIAGNOSIS — R600 Localized edema: Secondary | ICD-10-CM | POA: Diagnosis present

## 2018-01-27 HISTORY — DX: Lymphedema, not elsewhere classified: I89.0

## 2018-01-27 HISTORY — PX: LEFT HEART CATH AND CORONARY ANGIOGRAPHY: CATH118249

## 2018-01-27 LAB — MRSA PCR SCREENING: MRSA by PCR: POSITIVE — AB

## 2018-01-27 LAB — CBC
HEMATOCRIT: 31.4 % — AB (ref 36.0–46.0)
HEMATOCRIT: 31.3 % — AB (ref 36.0–46.0)
Hemoglobin: 9.6 g/dL — ABNORMAL LOW (ref 12.0–15.0)
Hemoglobin: 9.3 g/dL — ABNORMAL LOW (ref 12.0–15.0)
MCH: 28.7 pg (ref 26.0–34.0)
MCH: 28.3 pg (ref 26.0–34.0)
MCHC: 30.6 g/dL (ref 30.0–36.0)
MCHC: 29.7 g/dL — AB (ref 30.0–36.0)
MCV: 93.7 fL (ref 78.0–100.0)
MCV: 95.1 fL (ref 78.0–100.0)
PLATELETS: 297 10*3/uL (ref 150–400)
Platelets: 285 10*3/uL (ref 150–400)
RBC: 3.35 MIL/uL — ABNORMAL LOW (ref 3.87–5.11)
RBC: 3.29 MIL/uL — ABNORMAL LOW (ref 3.87–5.11)
RDW: 15.9 % — AB (ref 11.5–15.5)
RDW: 16.1 % — AB (ref 11.5–15.5)
WBC: 3.6 10*3/uL — AB (ref 4.0–10.5)
WBC: 4.3 10*3/uL (ref 4.0–10.5)

## 2018-01-27 LAB — BASIC METABOLIC PANEL
Anion gap: 14 (ref 5–15)
Anion gap: 8 (ref 5–15)
BUN: 16 mg/dL (ref 8–23)
BUN: 13 mg/dL (ref 8–23)
CHLORIDE: 109 mmol/L (ref 98–111)
CO2: 23 mmol/L (ref 22–32)
CO2: 26 mmol/L (ref 22–32)
CREATININE: 0.92 mg/dL (ref 0.44–1.00)
Calcium: 9.1 mg/dL (ref 8.9–10.3)
Calcium: 8.6 mg/dL — ABNORMAL LOW (ref 8.9–10.3)
Chloride: 105 mmol/L (ref 98–111)
Creatinine, Ser: 0.96 mg/dL (ref 0.44–1.00)
GFR calc Af Amer: >60 mL/min (ref >60)
GFR calc Af Amer: >60 mL/min (ref >60)
GFR calc non Af Amer: 59 mL/min — ABNORMAL LOW (ref >60)
GFR, EST NON AFRICAN AMERICAN: 56 mL/min — AB (ref >60)
GLUCOSE: 91 mg/dL (ref 70–99)
Glucose, Bld: 111 mg/dL — ABNORMAL HIGH (ref 70–99)
POTASSIUM: 3.3 mmol/L — AB (ref 3.5–5.1)
POTASSIUM: 4.1 mmol/L (ref 3.5–5.1)
SODIUM: 142 mmol/L (ref 135–145)
Sodium: 143 mmol/L (ref 135–145)

## 2018-01-27 LAB — I-STAT CHEM 8, ED
BUN: 20 mg/dL (ref 8–23)
CALCIUM ION: 1.03 mmol/L — AB (ref 1.15–1.40)
Chloride: 107 mmol/L (ref 98–111)
Creatinine, Ser: 0.9 mg/dL (ref 0.44–1.00)
GLUCOSE: 103 mg/dL — AB (ref 70–99)
HCT: 29 % — ABNORMAL LOW (ref 36.0–46.0)
HEMOGLOBIN: 9.9 g/dL — AB (ref 12.0–15.0)
POTASSIUM: 3.4 mmol/L — AB (ref 3.5–5.1)
Sodium: 141 mmol/L (ref 135–145)
TCO2: 26 mmol/L (ref 22–32)

## 2018-01-27 LAB — LIPID PANEL
CHOL/HDL RATIO: 1.9 ratio
Cholesterol: 113 mg/dL (ref 0–200)
HDL: 60 mg/dL (ref >40)
LDL CALC: 46 mg/dL (ref 0–99)
Triglycerides: 35 mg/dL (ref <150)
VLDL: 7 mg/dL (ref 0–40)

## 2018-01-27 LAB — TROPONIN I
TROPONIN I: 22.15 ng/mL — AB (ref <0.03)
Troponin I: 21.44 ng/mL (ref <0.03)
Troponin I: 12.98 ng/mL (ref <0.03)

## 2018-01-27 LAB — ECHOCARDIOGRAM COMPLETE
Height: 61 in
Weight: 3360 oz

## 2018-01-27 LAB — I-STAT TROPONIN, ED: Troponin i, poc: 1.21 ng/mL (ref 0.00–0.08)

## 2018-01-27 SURGERY — LEFT HEART CATH AND CORONARY ANGIOGRAPHY
Anesthesia: LOCAL

## 2018-01-27 MED ORDER — TOFACITINIB CITRATE 10 MG PO TABS
10.0000 mg | ORAL_TABLET | Freq: Two times a day (BID) | ORAL | Status: DC
  Administered 2018-01-27 – 2018-01-29 (×4): 10 mg via ORAL
  Filled 2018-01-27 (×3): qty 1

## 2018-01-27 MED ORDER — POTASSIUM CHLORIDE CRYS ER 20 MEQ PO TBCR
20.0000 meq | EXTENDED_RELEASE_TABLET | Freq: Every day | ORAL | Status: DC
  Administered 2018-01-27 – 2018-01-28 (×2): 20 meq via ORAL
  Filled 2018-01-27 (×2): qty 1

## 2018-01-27 MED ORDER — ASPIRIN EC 81 MG PO TBEC: 81.0000 mg | DELAYED_RELEASE_TABLET | Freq: Every day | ORAL | Status: DC

## 2018-01-27 MED ORDER — SODIUM CHLORIDE 0.9 % IV SOLN: INTRAVENOUS | Status: AC

## 2018-01-27 MED ORDER — HEPARIN (PORCINE) IN NACL 100-0.45 UNIT/ML-% IJ SOLN
1000.0000 [IU]/h | INTRAMUSCULAR | Status: DC
  Administered 2018-01-27: 1000 [IU]/h via INTRAVENOUS
  Filled 2018-01-27: qty 250

## 2018-01-27 MED ORDER — NITROGLYCERIN 2 % TD OINT
1.0000 [in_us] | TOPICAL_OINTMENT | Freq: Once | TRANSDERMAL | Status: AC
  Administered 2018-01-27: 1 [in_us] via TOPICAL
  Filled 2018-01-27: qty 1

## 2018-01-27 MED ORDER — ONDANSETRON HCL 4 MG/2ML IJ SOLN
4.0000 mg | Freq: Once | INTRAMUSCULAR | Status: AC
  Administered 2018-01-27: 4 mg via INTRAVENOUS
  Filled 2018-01-27: qty 2

## 2018-01-27 MED ORDER — NITROGLYCERIN 0.4 MG SL SUBL: 0.4000 mg | SUBLINGUAL_TABLET | SUBLINGUAL | Status: DC | PRN

## 2018-01-27 MED ORDER — HEPARIN (PORCINE) IN NACL 1000-0.9 UT/500ML-% IV SOLN
INTRAVENOUS | Status: DC | PRN
  Administered 2018-01-27 (×2): 500 mL

## 2018-01-27 MED ORDER — METOPROLOL TARTRATE 25 MG PO TABS: 25.0000 mg | ORAL_TABLET | Freq: Three times a day (TID) | ORAL | Status: DC

## 2018-01-27 MED ORDER — ASPIRIN 81 MG PO CHEW
81.0000 mg | CHEWABLE_TABLET | Freq: Every day | ORAL | Status: DC
  Administered 2018-01-27: 81 mg via ORAL
  Filled 2018-01-27: qty 1

## 2018-01-27 MED ORDER — ONDANSETRON HCL 4 MG/2ML IJ SOLN: 4.0000 mg | Freq: Four times a day (QID) | INTRAMUSCULAR | Status: DC | PRN

## 2018-01-27 MED ORDER — HEPARIN SODIUM (PORCINE) 1000 UNIT/ML IJ SOLN
INTRAMUSCULAR | Status: DC | PRN
  Administered 2018-01-27: 5000 [IU] via INTRAVENOUS

## 2018-01-27 MED ORDER — MIDAZOLAM HCL 2 MG/2ML IJ SOLN
INTRAMUSCULAR | Status: AC
  Filled 2018-01-27: qty 2

## 2018-01-27 MED ORDER — ASPIRIN 300 MG RE SUPP: 300.0000 mg | RECTAL | Status: DC

## 2018-01-27 MED ORDER — ASPIRIN 81 MG PO CHEW: 324.0000 mg | CHEWABLE_TABLET | ORAL | Status: DC

## 2018-01-27 MED ORDER — SODIUM CHLORIDE 0.9 % WEIGHT BASED INFUSION: 1.0000 mL/kg/h | INTRAVENOUS | Status: DC

## 2018-01-27 MED ORDER — LIDOCAINE HCL (PF) 1 % IJ SOLN
INTRAMUSCULAR | Status: DC | PRN
  Administered 2018-01-27: 6 mL via SUBCUTANEOUS

## 2018-01-27 MED ORDER — HEPARIN BOLUS VIA INFUSION
4000.0000 [IU] | Freq: Once | INTRAVENOUS | Status: AC
  Administered 2018-01-27: 4000 [IU] via INTRAVENOUS
  Filled 2018-01-27: qty 4000

## 2018-01-27 MED ORDER — SODIUM CHLORIDE 0.9 % WEIGHT BASED INFUSION: 3.0000 mL/kg/h | INTRAVENOUS | Status: DC

## 2018-01-27 MED ORDER — ISOSORBIDE MONONITRATE ER 30 MG PO TB24
30.0000 mg | ORAL_TABLET | Freq: Every day | ORAL | Status: DC
  Administered 2018-01-27 – 2018-01-29 (×3): 30 mg via ORAL
  Filled 2018-01-27 (×3): qty 1

## 2018-01-27 MED ORDER — PREDNISOLONE ACETATE 1 % OP SUSP: 1.0000 [drp] | Freq: Every day | OPHTHALMIC | Status: DC

## 2018-01-27 MED ORDER — IOPAMIDOL (ISOVUE-370) INJECTION 76%
100.0000 mL | Freq: Once | INTRAVENOUS | Status: AC | PRN
  Administered 2018-01-27: 100 mL via INTRAVENOUS

## 2018-01-27 MED ORDER — SODIUM CHLORIDE 0.9 % IV SOLN
INTRAVENOUS | Status: AC | PRN
  Administered 2018-01-27: 10 mL/h via INTRAVENOUS

## 2018-01-27 MED ORDER — MESALAMINE 1.2 G PO TBEC
2.4000 g | DELAYED_RELEASE_TABLET | Freq: Every day | ORAL | Status: DC
  Administered 2018-01-28 – 2018-01-29 (×2): 2.4 g via ORAL
  Filled 2018-01-27 (×3): qty 2

## 2018-01-27 MED ORDER — FENTANYL CITRATE (PF) 100 MCG/2ML IJ SOLN
INTRAMUSCULAR | Status: AC
  Filled 2018-01-27: qty 2

## 2018-01-27 MED ORDER — LIDOCAINE HCL (PF) 1 % IJ SOLN
INTRAMUSCULAR | Status: AC
  Filled 2018-01-27: qty 30

## 2018-01-27 MED ORDER — SODIUM CHLORIDE 0.9 % IV SOLN: 250.0000 mL | INTRAVENOUS | Status: DC | PRN

## 2018-01-27 MED ORDER — LOSARTAN POTASSIUM 50 MG PO TABS
50.0000 mg | ORAL_TABLET | Freq: Every day | ORAL | Status: DC
  Administered 2018-01-28 – 2018-01-29 (×2): 50 mg via ORAL
  Filled 2018-01-27 (×3): qty 1

## 2018-01-27 MED ORDER — HEPARIN SODIUM (PORCINE) 5000 UNIT/ML IJ SOLN
5000.0000 [IU] | Freq: Three times a day (TID) | INTRAMUSCULAR | Status: DC
  Administered 2018-01-28 – 2018-01-29 (×4): 5000 [IU] via SUBCUTANEOUS
  Filled 2018-01-27 (×5): qty 1

## 2018-01-27 MED ORDER — ACETAMINOPHEN 325 MG PO TABS
650.0000 mg | ORAL_TABLET | ORAL | Status: DC | PRN
  Administered 2018-01-27 – 2018-01-28 (×3): 650 mg via ORAL
  Filled 2018-01-27 (×3): qty 2

## 2018-01-27 MED ORDER — SODIUM CHLORIDE 0.9% FLUSH
3.0000 mL | Freq: Two times a day (BID) | INTRAVENOUS | Status: DC
  Administered 2018-01-28 – 2018-01-29 (×3): 3 mL via INTRAVENOUS

## 2018-01-27 MED ORDER — VERAPAMIL HCL 2.5 MG/ML IV SOLN
INTRAVENOUS | Status: AC
  Filled 2018-01-27: qty 2

## 2018-01-27 MED ORDER — FUROSEMIDE 40 MG PO TABS
40.0000 mg | ORAL_TABLET | Freq: Every day | ORAL | Status: DC
  Administered 2018-01-27 – 2018-01-29 (×3): 40 mg via ORAL
  Filled 2018-01-27 (×3): qty 1

## 2018-01-27 MED ORDER — MUPIROCIN 2 % EX OINT
TOPICAL_OINTMENT | CUTANEOUS | Status: AC
  Administered 2018-01-27: 1
  Filled 2018-01-27: qty 22

## 2018-01-27 MED ORDER — METOPROLOL TARTRATE 25 MG PO TABS
25.0000 mg | ORAL_TABLET | Freq: Three times a day (TID) | ORAL | Status: DC
  Administered 2018-01-27 – 2018-01-29 (×6): 25 mg via ORAL
  Filled 2018-01-27 (×7): qty 1

## 2018-01-27 MED ORDER — POTASSIUM CHLORIDE CRYS ER 20 MEQ PO TBCR
40.0000 meq | EXTENDED_RELEASE_TABLET | Freq: Once | ORAL | Status: AC
  Administered 2018-01-27: 40 meq via ORAL
  Filled 2018-01-27: qty 2

## 2018-01-27 MED ORDER — SODIUM CHLORIDE 0.9% FLUSH: 3.0000 mL | Freq: Two times a day (BID) | INTRAVENOUS | Status: DC

## 2018-01-27 MED ORDER — CHLORHEXIDINE GLUCONATE CLOTH 2 % EX PADS
6.0000 | MEDICATED_PAD | Freq: Every day | CUTANEOUS | Status: DC
  Administered 2018-01-27 – 2018-01-29 (×2): 6 via TOPICAL

## 2018-01-27 MED ORDER — SODIUM CHLORIDE 0.9% FLUSH
3.0000 mL | INTRAVENOUS | Status: DC | PRN
  Administered 2018-01-29: 3 mL via INTRAVENOUS
  Filled 2018-01-27: qty 3

## 2018-01-27 MED ORDER — IOHEXOL 350 MG/ML SOLN
INTRAVENOUS | Status: DC | PRN
  Administered 2018-01-27: 60 mL via INTRA_ARTERIAL

## 2018-01-27 MED ORDER — ATORVASTATIN CALCIUM 80 MG PO TABS
80.0000 mg | ORAL_TABLET | Freq: Every day | ORAL | Status: DC
  Administered 2018-01-27 – 2018-01-28 (×2): 80 mg via ORAL
  Filled 2018-01-27 (×2): qty 1

## 2018-01-27 MED ORDER — MIDAZOLAM HCL 2 MG/2ML IJ SOLN
INTRAMUSCULAR | Status: DC | PRN
  Administered 2018-01-27: 1 mg via INTRAVENOUS

## 2018-01-27 MED ORDER — HEPARIN (PORCINE) IN NACL 1000-0.9 UT/500ML-% IV SOLN
INTRAVENOUS | Status: AC
  Filled 2018-01-27: qty 1000

## 2018-01-27 MED ORDER — VERAPAMIL HCL 2.5 MG/ML IV SOLN
INTRAVENOUS | Status: DC | PRN
  Administered 2018-01-27: 10 mL via INTRA_ARTERIAL

## 2018-01-27 MED ORDER — SODIUM CHLORIDE 0.9% FLUSH: 3.0000 mL | INTRAVENOUS | Status: DC | PRN

## 2018-01-27 MED ORDER — HEPARIN SODIUM (PORCINE) 1000 UNIT/ML IJ SOLN
INTRAMUSCULAR | Status: AC
  Filled 2018-01-27: qty 1

## 2018-01-27 MED ORDER — MUPIROCIN 2 % EX OINT
1.0000 "application " | TOPICAL_OINTMENT | Freq: Two times a day (BID) | CUTANEOUS | Status: DC
  Administered 2018-01-28 – 2018-01-29 (×3): 1 via NASAL
  Filled 2018-01-27 (×3): qty 22

## 2018-01-27 MED ORDER — ASPIRIN 81 MG PO CHEW: 81.0000 mg | CHEWABLE_TABLET | ORAL | Status: DC

## 2018-01-27 MED ORDER — FENTANYL CITRATE (PF) 100 MCG/2ML IJ SOLN
25.0000 ug | Freq: Once | INTRAMUSCULAR | Status: AC
  Administered 2018-01-27: 25 ug via INTRAVENOUS
  Filled 2018-01-27: qty 2

## 2018-01-27 MED ORDER — ACETAMINOPHEN 500 MG PO TABS: 500.0000 mg | ORAL_TABLET | Freq: Four times a day (QID) | ORAL | Status: DC | PRN

## 2018-01-27 MED ORDER — FENTANYL CITRATE (PF) 100 MCG/2ML IJ SOLN
INTRAMUSCULAR | Status: DC | PRN
  Administered 2018-01-27: 50 ug via INTRAVENOUS

## 2018-01-27 SURGICAL SUPPLY — 9 items
CATH INFINITI 5FR MULTPACK ANG (CATHETERS) ×1 IMPLANT
DEVICE RAD COMP TR BAND LRG (VASCULAR PRODUCTS) ×1 IMPLANT
GLIDESHEATH SLEND A-KIT 6F 22G (SHEATH) ×1 IMPLANT
GUIDEWIRE INQWIRE 1.5J.035X260 (WIRE) IMPLANT
INQWIRE 1.5J .035X260CM (WIRE) ×2
KIT HEART LEFT (KITS) ×2 IMPLANT
PACK CARDIAC CATHETERIZATION (CUSTOM PROCEDURE TRAY) ×2 IMPLANT
TRANSDUCER W/STOPCOCK (MISCELLANEOUS) ×2 IMPLANT
TUBING CIL FLEX 10 FLL-RA (TUBING) ×2 IMPLANT

## 2018-01-27 NOTE — Progress Notes (Signed)
ANTICOAGULATION CONSULT NOTE - Initial Consult  Pharmacy Consult for Heparin Indication: chest pain/ACS/STEMI  Allergies  Allergen Reactions  . Codeine Shortness Of Breath and Other (See Comments)    Made back hurt; does not affect liver per patient but has has caused difficulty breathing/tolerates Percocet only in small doses  . Imodium A-D [Loperamide Hcl] Shortness Of Breath and Other (See Comments)    Caused a pain & "squeezing" sensation in the lungs also  . Neomycin Swelling and Other (See Comments)    Reaction to eye ointment - eyes became swollen shut Makes wounds ooze  . Penicillinase Shortness Of Breath and Rash    UNKNOWN IF THIS IS INCORRECTLY IDENTIFIED PATIENT IS ALLERGIC TO PENICILLINS ? PENICILLINS ? OTHER-SOB  . Penicillins Hives and Shortness Of Breath    Reaction @ 18-yrs old Has patient had a PCN reaction causing immediate rash, facial/tongue/throat swelling, SOB or lightheadedness with hypotension: Yes Has patient had a PCN reaction causing severe rash involving mucus membranes or skin necrosis: No Has patient had a PCN reaction that required hospitalization: No Has patient had a PCN reaction occurring within the last 10 years: No If all of the above answers are "NO", then may proceed with Cephalosporin use.  . Tramadol Shortness Of Breath and Other (See Comments)    Per patient "felt like lungs were being squeezed" Per patient "felt like lungs were being squeezed"  . Lisinopril Cough  . Hornet Venom Swelling  . Adhesive [Tape] Rash  . Erythromycin Diarrhea  . Hydromorphone Other (See Comments)    Severe Lethargy   . Oxycodone-Acetaminophen     confusion  . Percocet [Oxycodone-Acetaminophen] Other (See Comments)    Confusion     Patient Measurements: Height: 5' 1"  (154.9 cm) Weight: 210 lb (95.3 kg) IBW/kg (Calculated) : 47.8 Heparin Dosing Weight: 70.4 kg  Vital Signs: Temp: 97.6 F (36.4 C) (09/17 0055) Temp Source: Oral (09/17 0055) BP:  139/73 (09/17 0230) Pulse Rate: 68 (09/17 0230)  Labs: Recent Labs    01/27/18 0103 01/27/18 0151  HGB 9.6* 9.9*  HCT 31.4* 29.0*  PLT 297  --   CREATININE 0.96 0.90    Estimated Creatinine Clearance: 57 mL/min (by C-G formula based on SCr of 0.9 mg/dL).   Medical History: Past Medical History:  Diagnosis Date  . Arthritis   . Complication of anesthesia    Took a while to wake up with knee  . Coronary artery disease   . Dyspnea    with walking at times  . Fallen bladder   . GERD (gastroesophageal reflux disease)   . 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 Headache    migraines in her younger years  . Hemorrhoids   . High cholesterol   . History of blood transfusion   . Hx of adenomatous colonic polyps   . Hypertension   . Lung nodule   . Myocardial infarct ([place]) 03/24/2011   Dr. [name]  . Nocturnal leg cramps   . Obesity   . Sarcoidosis   . Sleep apnea    uses cpap    Medications:  See PTA medication list  Assessment: 77 y.o female [facility] ED 9/17 AM reports chest pain. Pharmacy consulted to dose heparin infusion for ACS/STEMI. Not taking an anticoagulant PTA. Hgb 9.9, pltc wnl  Goal of Therapy:  Heparin level 0.3-0.7 units/ml Monitor platelets by anticoagulation protocol: Yes  Plan:  Heparin 4000 units IV bolus x1 Heparin drip 1000 units/hr Heparin level in 8 hours Daily heparin level and CBC.  Nicole Cella, RPh Clinical Pharmacist Please check AMION for all [facility] phone numbers After 10:00 PM, call [facility] [phone] 01/27/2018,3:02 AM

## 2018-01-27 NOTE — Progress Notes (Signed)
Placed patient on CPAP via [facility], (home mask & tubing) 12.0 cm H20  Per pt home settings.

## 2018-01-27 NOTE — ED Provider Notes (Signed)
[facility] EMERGENCY DEPARTMENT Provider Note   CSN: [id number] Arrival date & time: 01/27/18  0044     History   Chief Complaint Chief Complaint  Patient presents with  . Chest Pain    HPI Isabella [name] is a 77 y.o. female.  HPI  This is a 77 year old female with a history of coronary artery disease status stent in 2012, reflux, quit doses who presents with chest pain.  Patient reports that she woke up at 11:30 PM from sleep with anterior pressure-like chest pain and pain across her back.  She states "it hurts in the back of my lungs."  She denies any ripping or tearing nature to the pain.  Currently she rates her pain at 7 out of 10.  Pain was not relieved with nitroglycerin in route.  She did receive a full dose aspirin.  She denies any weakness or numbness of the upper extremities.  Denies any recent fevers or cough.  Reports lower extremity swelling which is at baseline.  No history of blood clots.  Patient states that this does not feel consistent with when she required a stent in 2012.  Past Medical History:  Diagnosis Date  . Arthritis   . Complication of anesthesia    Took a while to wake up with knee  . Coronary artery disease   . Dyspnea    with walking at times  . Fallen bladder   . GERD (gastroesophageal reflux disease)   . 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 Headache    migraines in her younger years  . Hemorrhoids   . High cholesterol   . History of blood transfusion   . Hx of adenomatous colonic polyps   . Hypertension   . Lung nodule   . Myocardial infarct ([place]) 03/24/2011   Dr. [name]  . Nocturnal leg cramps   . Obesity   . Sarcoidosis   . Sleep apnea    uses cpap    Patient Active Problem List   Diagnosis Date Noted  . Malnutrition of moderate degree 10/29/2017  . Cellulitis 10/27/2017  . Acute diastolic CHF (congestive heart failure) ([place])  10/15/2017  . Acute on chronic colitis 09/29/2017  . GERD (gastroesophageal reflux disease) 09/29/2017  . Degenerative spondylolisthesis 02/01/2014  . Sarcoidosis 07/14/2013  . Abnormal CT of the chest 04/21/2013  . Coronary atherosclerosis of native coronary artery 03/29/2013    Class: Chronic  . Old MI (myocardial infarction) 03/29/2013    Class: Chronic  . OA (osteoarthritis) of knee 06/17/2012  . Lung nodule 06/03/2012  . DJD (degenerative joint disease) 06/01/2012  . 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 Edema of lower extremity 03/29/2011  . Anemia associated with acute blood loss 03/25/2011  . Hematoma following PCI 03/24/2011  . Obesity (BMI 30-39.9)   . Obstructive sleep apnea 01/25/2009  . Hyperlipidemia 01/24/2009  . Essential hypertension 01/24/2009    Past Surgical History:  Procedure Laterality Date  . ABDOMINAL HYSTERECTOMY  1990  . APPENDECTOMY  1970  . BACK SURGERY  2015   lower back  . CARDIAC CATHETERIZATION  03/2011  . CARPAL TUNNEL RELEASE Bilateral 2004  . CATARACT EXTRACTION    . CHOLECYSTECTOMY  1970  . COLONOSCOPY W/ BIOPSIES AND POLYPECTOMY    . CORONARY ANGIOPLASTY  2012  . CORONARY STENT PLACEMENT  03/24/2011   Dr. [name]  . EYE SURGERY  2008,2011  corneal transplant  . FEMORAL ARTERY EXPLORATION  03/25/2011   Procedure: FEMORAL ARTERY EXPLORATION;  Surgeon: Elam Dutch, MD;  Location: [facility];  Service: Vascular;  Laterality: Right;  Evacuation of Hematoma   . hematoma surgery     from cardiac cath -hematoma in groin-right  . HERNIA REPAIR  7371   umbilical  . JOINT REPLACEMENT    . KNEE ARTHROSCOPY  2006   Right  . [facility] Surgery Left Eye  2012  . LEFT HEART CATHETERIZATION WITH CORONARY ANGIOGRAM N/A 03/24/2011   Procedure: LEFT HEART CATHETERIZATION WITH CORONARY ANGIOGRAM;  Surgeon: Sinclair Grooms, MD;  Location: [facility] CATH LAB;  Service: Cardiovascular;  Laterality: N/A;  . PERCUTANEOUS CORONARY STENT INTERVENTION (PCI-S) N/A  03/24/2011   Procedure: PERCUTANEOUS CORONARY STENT INTERVENTION (PCI-S);  Surgeon: Sinclair Grooms, MD;  Location: [facility] CATH LAB;  Service: Cardiovascular;  Laterality: N/A;  . SHOULDER SURGERY  2010  . TONSILLECTOMY  1949  . TONSILLECTOMY AND ADENOIDECTOMY    . TOTAL KNEE ARTHROPLASTY  07/2010   Left  . TOTAL KNEE ARTHROPLASTY  06/17/2012   Procedure: TOTAL KNEE ARTHROPLASTY;  Surgeon: Gearlean Alf, MD;  Location: [facility] ORS;  Service: Orthopedics;  Laterality: Right;  . TUBAL LIGATION  1978  . [place], 2007   x 2  . VIDEO BRONCHOSCOPY WITH ENDOBRONCHIAL NAVIGATION  04/13/2012   Procedure: VIDEO BRONCHOSCOPY WITH ENDOBRONCHIAL NAVIGATION;  Surgeon: Melrose Nakayama, MD;  Location: [facility];  Service: Thoracic;  Laterality: N/A;  . VIDEO BRONCHOSCOPY WITH ENDOBRONCHIAL NAVIGATION N/A 04/08/2016   Procedure: VIDEO BRONCHOSCOPY WITH ENDOBRONCHIAL NAVIGATION;  Surgeon: Rigoberto Noel, MD;  Location: [facility];  Service: Thoracic;  Laterality: N/A;     OB History   None      Home Medications    Prior to Admission medications   Medication Sig Start Date End Date Taking? Authorizing Provider  acetaminophen (TYLENOL) 500 MG tablet Take 500 mg by mouth every 6 (six) hours as needed for mild pain.   Yes [provider]  aspirin EC 81 MG tablet Take 81 mg by mouth daily at 3 pm.   Yes [provider]  atorvastatin (LIPITOR) 40 MG tablet Take 40 mg by mouth at bedtime.   Yes [provider]  Calcium Carb-Cholecalciferol (CALCIUM + D3 PO) Take 1 tablet by mouth daily with supper.   Yes [provider]  chlorpheniramine (CHLOR-TRIMETON) 4 MG tablet Take 8 mg by mouth at bedtime.    Yes [provider]  furosemide (LASIX) 40 MG tablet Take 40 mg by mouth daily.   Yes [provider]  losartan (COZAAR) 50 MG tablet Take 50 mg by mouth daily as needed (ONLY IF BLOOD PRESSURE IS GREATER THAN 110/70).    Yes [provider]    mesalamine (APRISO) 0.375 g 24 hr capsule Take 1,500 mg by mouth daily.   Yes [provider]  metoprolol tartrate (LOPRESSOR) 25 MG tablet Take 25 mg by mouth 2 (two) times daily as needed (for an elevated B/P).    Yes [provider]  Multiple Vitamins-Minerals (CENTRUM SILVER 50+WOMEN) TABS Take 1 tablet by mouth daily with supper.    Yes [provider]  nitroGLYCERIN (NITROSTAT) 0.4 MG SL tablet Place 1 tablet (0.4 mg total) under the tongue every 5 (five) minutes as needed for chest pain. 12/12/17  Yes Imogene Burn, PA-C  potassium chloride SA (K-DUR,KLOR-CON) 20 MEQ tablet Take 20 mEq  by mouth daily with supper.    Yes [provider]  prednisoLONE acetate (PRED FORTE) 1 % ophthalmic suspension Place 1 drop into the left eye daily.   Yes [provider]  Tofacitinib Citrate (XELJANZ) 10 MG TABS Take 10 mg by mouth 2 (two) times daily.   Yes [provider]  UNABLE TO FIND CPAP: At bedtime nightly and during all naps   Yes [provider]    Family History Family History  Problem Relation Age of Onset  . Hypertension Mother   . Cancer Father   . Breast cancer Maternal Grandmother     Social History Social History   Tobacco Use  . Smoking status: Never Smoker  . Smokeless tobacco: Never Used  Substance Use Topics  . Alcohol use: No  . Drug use: No     Allergies   Codeine; Imodium a-d [loperamide hcl]; Neomycin; Penicillinase; Penicillins; Tramadol; Lisinopril; Hornet venom; Adhesive [tape]; Erythromycin; Hydromorphone; Oxycodone-acetaminophen; and Percocet [oxycodone-acetaminophen]   Review of Systems Review of Systems  Constitutional: Negative for fever.  Respiratory: Negative for cough and shortness of breath.   Cardiovascular: Positive for chest pain and leg swelling. Negative for palpitations.  Gastrointestinal: Negative for abdominal pain, nausea and vomiting.  Neurological: Negative for dizziness.   All other systems reviewed and are negative.    Physical Exam Updated Vital Signs BP 136/81   Pulse 64   Temp 97.6 F (36.4 C) (Oral)   Resp 16   Ht 1.549 m (5' 1" )   Wt 95.3 kg   SpO2 98%   BMI 39.68 kg/m   Physical Exam  Constitutional: She is oriented to person, place, and time. She appears well-developed and well-nourished.  Overweight, nontoxic-appearing  HENT:  Head: Normocephalic and atraumatic.  Neck: Neck supple.  Cardiovascular: Normal rate, regular rhythm, normal heart sounds and normal pulses.  Pulmonary/Chest: Effort normal. No respiratory distress. She has no wheezes.  Abdominal: Soft. Bowel sounds are normal.  Musculoskeletal:       Right lower leg: She exhibits edema.       Left lower leg: She exhibits edema.  3+ lower extremity edema, pitting to the knee, slight erythema bilaterally  Neurological: She is alert and oriented to person, place, and time.  Skin: Skin is warm and dry.  Psychiatric: She has a normal mood and affect.  Nursing note and vitals reviewed.    ED Treatments / Results  Labs (all labs ordered are listed, but only abnormal results are displayed) Labs Reviewed  BASIC METABOLIC PANEL - Abnormal; Notable for the following components:      Result Value   Potassium 3.3 (*)    Glucose, Bld 111 (*)    GFR calc non Af Amer 56 (*)    All other components within normal limits  CBC - Abnormal; Notable for the following components:   WBC 3.6 (*)    RBC 3.35 (*)    Hemoglobin 9.6 (*)    HCT 31.4 (*)    RDW 15.9 (*)    All other components within normal limits  I-STAT TROPONIN, ED - Abnormal; Notable for the following components:   Troponin i, poc 1.21 (*)    All other components within normal limits  I-STAT CHEM 8, ED - Abnormal; Notable for the following components:   Potassium 3.4 (*)    Glucose, Bld 103 (*)    Calcium, Ion 1.03 (*)    Hemoglobin 9.9 (*)    HCT 29.0 (*)  All other components within normal limits  HEPARIN LEVEL  (UNFRACTIONATED)  CBC  TROPONIN I  TROPONIN I  TROPONIN I    EKG EKG Interpretation  Date/Time:  Tuesday January 27 2018 00:52:02 EDT Ventricular Rate:  53 PR Interval:  244 QRS Duration: 86 QT Interval:  494 QTC Calculation: 463 R Axis:   33 Text Interpretation:  Sinus bradycardia with sinus arrhythmia with 1st degree A-V block Otherwise normal ECG Rate slower Confirmed by Thayer Jew [id number]) on 01/27/2018 1:04:55 AM   Radiology Dg Chest 2 View  Result Date: 01/27/2018 CLINICAL DATA:  Chest pain onset at 2330 hours EXAM: CHEST - 2 VIEW COMPARISON:  09/30/2017 FINDINGS: Cardiomegaly. No pulmonary consolidation or overt pulmonary edema. No effusion. Degenerative changes are present about the [facility] and glenohumeral joints bilaterally and along the thoracic spine. IMPRESSION: No active cardiopulmonary disease.  Cardiomegaly. Electronically Signed   By: Ashley Royalty M.D.   On: 01/27/2018 01:30   Ct Angio Chest/abd/pel For Dissection W And/or Wo Contrast  Result Date: 01/27/2018 CLINICAL DATA:  Initial evaluation for acute chest pain radiating to shoulders and back. EXAM: CT ANGIOGRAPHY CHEST, ABDOMEN AND PELVIS TECHNIQUE: Multidetector CT imaging through the chest, abdomen and pelvis was performed using the standard protocol during bolus administration of intravenous contrast. Multiplanar reconstructed images and MIPs were obtained and reviewed to evaluate the vascular anatomy. CONTRAST:  171m ISOVUE-370 IOPAMIDOL (ISOVUE-370) INJECTION 76% COMPARISON:  Prior radiograph from earlier same day as well as prior CT from 07/31/2017. FINDINGS: CTA CHEST FINDINGS Cardiovascular: Precontrast imaging through the intrathoracic aorta at demonstrates no mural thrombus or other acute abnormality. Postcontrast imaging demonstrates no evidence for dissection or other abnormality. No aneurysm. Partially visualized great vessels within normal limits. Right circumflex coronary stent noted. Cardiomegaly. No  pericardial effusion. Limited evaluation of the pulmonary arterial tree within normal limits. Mediastinum/Nodes: 14 mm hypodense right thyroid nodule noted, stable from previous. Visualized thyroid otherwise unremarkable. No enlarged mediastinal, hilar, or axillary adenopathy. Esophagus within normal limits. Lungs/Pleura: Tracheobronchial tree intact and patent. Lungs well inflated bilaterally. No focal infiltrates. No pulmonary edema or pleural effusion. No pneumothorax. Previously referenced nodule at the medial aspect of the right lower lobe measures similar in size at 16 x 13 mm (series 8, image 108). Additional somewhat oblong nodular density within the left lingula also similar measuring 19 x 8 mm (series 8, image 104). There is a new lobulated somewhat necrotic appearing nodule within the peripheral aspect of the superior right lower lobe measuring 19 x 14 mm (series 8, image 89). Multiple additional scattered subcentimeter nodules involving the bilateral lungs are otherwise grossly similar to previous. Musculoskeletal: External soft tissues demonstrate no acute finding. No acute osseous abnormality. No worrisome lytic or blastic osseous lesions. Multilevel degenerative spurring noted throughout the thoracic spine. Degenerative changes noted about the shoulders bilaterally, right worse than left. Review of the MIP images confirms the above findings. CTA ABDOMEN AND PELVIS FINDINGS VASCULAR Aorta: Normal intravascular enhancement seen throughout the intra-abdominal aorta. No evidence for dissection or aneurysm. Celiac: Celiac axis and its branch vessels are widely patent. SMA: SMA widely patent to its distal aspect. Renals: Single renal arteries present bilaterally, both of which are widely patent. Focal plaque at the origin of the left renal artery without significant stenosis noted. IMA: IMA widely patent. Inflow: Iliac arteries widely patent bilaterally without aneurysm or flow-limiting stenosis. Veins: No  appreciable venous abnormality. Review of the MIP images confirms the above findings. NON-VASCULAR Hepatobiliary: Multiple scattered cysts noted  within the liver, largest of which measures 3.3 cm positioned within the inferior right hepatic lobe. Liver otherwise unremarkable. Gallbladder appears to be absent. No biliary dilatation. Pancreas: Diffuse fatty infiltration the pancreas noted. Pancreas otherwise unremarkable. Spleen: Spleen within normal limits. Adrenals/Urinary Tract: Adrenal glands are normal. Kidneys fairly equal in size with symmetric enhancement. Scattered renal cysts noted bilaterally, largest of which on the right position within the lower pole measures 3 cm. Largest on the left positioned within the interpolar region and measures 4.1 cm. These are grossly similar to previous, although not well assessed on this examination due to streak artifact from spinal fusion hardware and technique. Scattered fat necrosis seen within the perirenal fat lateral to the left kidney, of uncertain significance. No nephrolithiasis or hydronephrosis. No appreciable focal enhancing renal mass. No hydroureter. Partially distended bladder within normal limits. Stomach/Bowel: Stomach within normal limits. No evidence for bowel obstruction. Colonic diverticulosis without evidence for acute diverticulitis. No acute inflammatory changes seen about the bowels. Lymphatic: No adenopathy. Reproductive: Uterus is absent. Normal left ovary. Right ovary not visualized. No adnexal mass. Other: No free air or fluid. Sequelae of prior ventral hernia repair. Musculoskeletal: No acute osseous abnormality. No discrete lytic or blastic osseous lesions. Prior posterior fusion at L3 through L5. Review of the MIP images confirms the above findings. IMPRESSION: 1. No CTA evidence for dissection or other acute aortic pathology. No aneurysm. 2. No other acute abnormality identified within the chest, abdomen, and pelvis. 3. Area of fat necrosis  involving the left. Renal fat, of uncertain etiology, but new relative to most recent CT from 06/17/2016. Correlation with history for possible previous intervention or other abnormality at this location. 4. Colonic diverticulosis without evidence for acute diverticulitis. 5. **An incidental finding of potential clinical significance has been found. New 19 mm lobulated nodule at the peripheral aspect of the right lower lobe, indeterminate. Remaining pulmonary nodules grossly similar to previous studies. Consider one of the following in 3 months for both low-risk and high-risk individuals: (a) repeat chest CT, (b) follow-up PET-CT, or (c) tissue sampling. This recommendation follows the consensus statement: Guidelines for Management of Incidental Pulmonary Nodules Detected on CT Images: From the Fleischner Society 2017; Radiology 2017; 284:228-243.** Electronically Signed   By: Jeannine Boga M.D.   On: 01/27/2018 03:47    Procedures Procedures (including critical care time)  CRITICAL CARE Performed by: Merryl Hacker   Total critical care time: 45 minutes  Critical care time was exclusive of separately billable procedures and treating other patients.  Critical care was necessary to treat or prevent imminent or life-threatening deterioration.  Critical care was time spent personally by me on the following activities: development of treatment plan with patient and/or surrogate as well as nursing, discussions with consultants, evaluation of patient's response to treatment, examination of patient, obtaining history from patient or surrogate, ordering and performing treatments and interventions, ordering and review of laboratory studies, ordering and review of radiographic studies, pulse oximetry and re-evaluation of patient's condition.  Medications Ordered in ED Medications  heparin ADULT infusion 100 units/mL (25000 units/264m sodium chloride 0.45%) (has no administration in time range)    heparin bolus via infusion 4,000 Units (has no administration in time range)  metoprolol tartrate (LOPRESSOR) tablet 25 mg (has no administration in time range)  aspirin chewable tablet 81 mg (has no administration in time range)  nitroGLYCERIN (NITROGLYN) 2 % ointment 1 inch (1 inch Topical Given 01/27/18 0159)  ondansetron (ZOFRAN) injection 4 mg (4 mg  Intravenous Given 01/27/18 0159)  fentaNYL (SUBLIMAZE) injection 25 mcg (25 mcg Intravenous Given 01/27/18 0159)  iopamidol (ISOVUE-370) 76 % injection 100 mL (100 mLs Intravenous Contrast Given 01/27/18 0222)     Initial Impression / Assessment and Plan / ED Course  I have reviewed the triage vital signs and the nursing notes.  Pertinent labs & imaging results that were available during my care of the patient were reviewed by me and considered in my medical decision making (see chart for details).  Clinical Course as of Jan 28 415  Tue Jan 27, 2018  0150 Patient with positive troponin.  Attempt to get her pain-free.  Given that her story is very atypical, I feel she also needs a dissection study to ensure integrity of her aorta and to ensure that she is not dissecting into her coronary.  Patient and daughter updated at the bedside.  If dissection study is negative, we will plan for admission and cardiology evaluation.  Troponin i, poc(!!): 1.21 [CH]  [id number] Patient is pain free.  CT read is pending.  No obvious dissection based on my review.   [CH]    Clinical Course User Index [CH] Horton, Barbette Hair, MD    Patient presents with chest and back pain.  Onset of 11:30 PM.  She is currently still having pain.  No improvement with nitroglycerin.  She does have a history of coronary artery disease but states this feels somewhat different than her stent was placed.  She is nontoxic-appearing and vital signs are reassuring.  EKG shows no signs of acute ischemia.  Chest x-ray shows no evidence of pneumothorax or pneumonia.  Given description of pain  and somewhat atypical nature, would be concern for possible dissection given predominance of back pain.  She is neurologically intact.  Will obtain a CT dissection study.  Of note, initial troponin is 1.21.  Dissection studies ordered.  Negative for acute dissection.  There are a few incidental findings.  Patient was placed on a heparin drip.  She is pain-free after fentanyl.  Patient was discussed with Dr. Aundra Dubin cardiology for admission.  Final Clinical Impressions(s) / ED Diagnoses   Final diagnoses:  NSTEMI (non-ST elevated myocardial infarction) [facility])    ED Discharge Orders    None       Dina Rich, Barbette Hair, MD 01/27/18 [phone]

## 2018-01-27 NOTE — Progress Notes (Signed)
  Echocardiogram 2D Echocardiogram has been performed.  Jennette Dubin 01/27/2018, 9:03 AM

## 2018-01-27 NOTE — Interval H&P Note (Signed)
Cath Lab Visit (complete for each Cath Lab visit)  Clinical Evaluation Leading to the Procedure:   ACS: Yes.    Non-ACS:    Anginal Classification: CCS III  Anti-ischemic medical therapy: Maximal Therapy (2 or more classes of medications)  Non-Invasive Test Results: Low-risk stress test findings: cardiac mortality <1%/year  Prior CABG: Previous CABG      History and Physical Interval Note:  01/27/2018 11:15 AM  Isabella [name]  has presented today for surgery, with the diagnosis of cp  The various methods of treatment have been discussed with the patient and family. After consideration of risks, benefits and other options for treatment, the patient has consented to  Procedure(s): LEFT HEART CATH AND CORONARY ANGIOGRAPHY (N/A) as a surgical intervention .  The patient's history has been reviewed, patient examined, no change in status, stable for surgery.  I have reviewed the patient's chart and labs.  Questions were answered to the patient's satisfaction.     Belva Crome III

## 2018-01-27 NOTE — ED Notes (Signed)
Dr Dina Rich informed of troponin results 1.21

## 2018-01-27 NOTE — ED Triage Notes (Signed)
BIB EMS from home, pt reports CP onset 2330 that woke her up from sleep. Pt states it radiates into her shoulders and back. 7/10, heavy/pressure. Pt received 3NTG and 324 ASA with no change in pain. VSS.

## 2018-01-27 NOTE — ED Notes (Signed)
Pt taken to cath lab by this RN

## 2018-01-27 NOTE — H&P (Signed)
Cardiology Admission History and Physical:   Patient ID: MERICA PRELL MRN: [id number]; DOB: 1940/10/13   Admission date: 01/27/2018  Primary Care Provider: [name], MD Primary Cardiologist: Sinclair Grooms, MD  Primary Electrophysiologist:  None   Chief Complaint:  Chest pain  Patient Profile:   ESTELLAR CADENA is a 77 y.o. female with NSTEMI  History of Present Illness:   Ms. Nordgren is a 77 yo with history of CAD, ulcerative colitis, HTN, diastolic CHF, and sarcoidosis was seen in the ER for chest pain and was found to have NSTEMI with TnI > 1.   She had inferior MI in 2012 with DES x 2 to the RCA (proximal for catheter-induced dissection and distal to treat acute lesion).  She has been treated more recently for UC with prednisone, now off.  She has had lung nodules with biopsy-proven sarcoidosis. Finally, she has history of RLE DVT, not currently anticoagulated.  Echo in 6/19 showed EF 60-65%.   At about 11:30 pm tonight, she woke from sleep with chest pressure but more prominent pain across her upper back.  Pain was not pleuritic.  She came to the ER.  TnI was elevated, no relief with NTG in ER but resolved with fentanyl.  She is currently pain-free, no complaints.  ECG without acute changes. TnI 1.21. CT chest ruled out aortic dissection. Evaluation of pulmonary arteries was limited but no evidence of PE.    Past Medical History:  Diagnosis Date  . Arthritis   . Complication of anesthesia    Took a while to wake up with knee  . Coronary artery disease   . Dyspnea    with walking at times  . Fallen bladder   . GERD (gastroesophageal reflux disease)   . 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 Headache    migraines in her younger years  . Hemorrhoids   . High cholesterol   . History of blood transfusion   . Hx of adenomatous colonic polyps   . Hypertension   . Lung nodule   . Myocardial infarct  ([place]) 03/24/2011   Dr. [name]  . Nocturnal leg cramps   . Obesity   . Sarcoidosis   . Sleep apnea    uses cpap    Past Surgical History:  Procedure Laterality Date  . ABDOMINAL HYSTERECTOMY  1990  . APPENDECTOMY  1970  . BACK SURGERY  2015   lower back  . CARDIAC CATHETERIZATION  03/2011  . CARPAL TUNNEL RELEASE Bilateral 2004  . CATARACT EXTRACTION    . CHOLECYSTECTOMY  1970  . COLONOSCOPY W/ BIOPSIES AND POLYPECTOMY    . CORONARY ANGIOPLASTY  2012  . CORONARY STENT PLACEMENT  03/24/2011   Dr. [name]  . EYE SURGERY  2008,2011   corneal transplant  . FEMORAL ARTERY EXPLORATION  03/25/2011   Procedure: FEMORAL ARTERY EXPLORATION;  Surgeon: Elam Dutch, MD;  Location: [facility];  Service: Vascular;  Laterality: Right;  Evacuation of Hematoma   . hematoma surgery     from cardiac cath -hematoma in groin-right  . HERNIA REPAIR  5176   umbilical  . JOINT REPLACEMENT    . KNEE ARTHROSCOPY  2006   Right  . [place] Surgery Left Eye  2012  . LEFT HEART CATHETERIZATION WITH CORONARY ANGIOGRAM N/A 03/24/2011   Procedure: LEFT HEART CATHETERIZATION WITH CORONARY ANGIOGRAM;  Surgeon: Belva Crome III,  MD;  Location: [facility] CATH [facility];  Service: Cardiovascular;  Laterality: N/A;  . PERCUTANEOUS CORONARY STENT INTERVENTION (PCI-S) N/A 03/24/2011   Procedure: PERCUTANEOUS CORONARY STENT INTERVENTION (PCI-S);  Surgeon: Sinclair Grooms, MD;  Location: [facility] CATH LAB;  Service: Cardiovascular;  Laterality: N/A;  . SHOULDER SURGERY  2010  . TONSILLECTOMY  1949  . TONSILLECTOMY AND ADENOIDECTOMY    . TOTAL KNEE ARTHROPLASTY  07/2010   Left  . TOTAL KNEE ARTHROPLASTY  06/17/2012   Procedure: TOTAL KNEE ARTHROPLASTY;  Surgeon: Gearlean Alf, MD;  Location: [facility];  Service: Orthopedics;  Laterality: Right;  . TUBAL LIGATION  1978  . [place], 2007   x 2  . VIDEO BRONCHOSCOPY WITH ENDOBRONCHIAL NAVIGATION  04/13/2012   Procedure: VIDEO BRONCHOSCOPY WITH ENDOBRONCHIAL NAVIGATION;   Surgeon: Melrose Nakayama, MD;  Location: [facility];  Service: Thoracic;  Laterality: N/A;  . VIDEO BRONCHOSCOPY WITH ENDOBRONCHIAL NAVIGATION N/A 04/08/2016   Procedure: VIDEO BRONCHOSCOPY WITH ENDOBRONCHIAL NAVIGATION;  Surgeon: Rigoberto Noel, MD;  Location: [facility];  Service: Thoracic;  Laterality: N/A;     Medications Prior to Admission: Prior to Admission medications   Medication Sig Start Date End Date Taking? Authorizing Provider  acetaminophen (TYLENOL) 500 MG tablet Take 500 mg by mouth every 6 (six) hours as needed for mild pain.   Yes [provider]  aspirin EC 81 MG tablet Take 81 mg by mouth daily at 3 pm.   Yes [provider]  atorvastatin (LIPITOR) 40 MG tablet Take 40 mg by mouth at bedtime.   Yes [provider]  Calcium Carb-Cholecalciferol (CALCIUM + D3 PO) Take 1 tablet by mouth daily with supper.   Yes [provider]  chlorpheniramine (CHLOR-TRIMETON) 4 MG tablet Take 8 mg by mouth at bedtime.    Yes [provider]  furosemide (LASIX) 40 MG tablet Take 40 mg by mouth daily.   Yes [provider]  losartan (COZAAR) 50 MG tablet Take 50 mg by mouth daily as needed (ONLY IF BLOOD PRESSURE IS GREATER THAN 110/70).    Yes [provider]  mesalamine (APRISO) 0.375 g 24 hr capsule Take 1,500 mg by mouth daily.   Yes [provider]  metoprolol tartrate (LOPRESSOR) 25 MG tablet Take 25 mg by mouth 2 (two) times daily as needed (for an elevated B/P).    Yes [provider]  Multiple Vitamins-Minerals (CENTRUM SILVER 50+WOMEN) TABS Take 1 tablet by mouth daily with supper.    Yes [provider]  nitroGLYCERIN (NITROSTAT) 0.4 MG SL tablet Place 1 tablet (0.4 mg total) under the tongue every 5 (five) minutes as needed for chest pain. 12/12/17  Yes Imogene Burn, PA-C  potassium chloride SA (K-DUR,KLOR-CON) 20 MEQ tablet Take 20 mEq by mouth daily with supper.    Yes [provider]    prednisoLONE acetate (PRED FORTE) 1 % ophthalmic suspension Place 1 drop into the left eye daily.   Yes [provider]  Tofacitinib Citrate (XELJANZ) 10 MG TABS Take 10 mg by mouth 2 (two) times daily.   Yes [provider]  UNABLE TO FIND CPAP: At bedtime nightly and during all naps   Yes [provider]     Allergies:    Allergies  Allergen Reactions  . Codeine Shortness Of Breath and Other (See Comments)    Made back hurt; does not affect liver per patient but has has caused difficulty breathing/tolerates Percocet only in  small doses  . Imodium A-D [Loperamide Hcl] Shortness Of Breath and Other (See Comments)    Caused a pain & "squeezing" sensation in the lungs also  . Neomycin Swelling and Other (See Comments)    Reaction to eye ointment - eyes became swollen shut Makes wounds ooze  . Penicillinase Hives, Shortness Of Breath and Rash    Has patient had a PCN reaction causing immediate rash, facial/tongue/throat swelling, SOB or lightheadedness with hypotension: Yes Has patient had a PCN reaction causing severe rash involving mucus membranes or skin necrosis: No Has patient had a PCN reaction that required hospitalization:No Has patient had a PCN reaction occurring within the last 10 years: No If all of the above answers are "NO", then may proceed with Cephalosporin use.   Marland Kitchen Penicillins Hives, Shortness Of Breath and Rash    Reaction @ 18-yrs old Has patient had a PCN reaction causing immediate rash, facial/tongue/throat swelling, SOB or lightheadedness with hypotension: Yes Has patient had a PCN reaction causing severe rash involving mucus membranes or skin necrosis: No Has patient had a PCN reaction that required hospitalization: No Has patient had a PCN reaction occurring within the last 10 years: No If all of the above answers are "NO", then may proceed with Cephalosporin use.  . Tramadol Shortness Of Breath and Other (See Comments)    (Per patient)  "felt like lungs were being squeezed"   . Lisinopril Cough  . Hornet Venom Swelling    Swelling at site of sting  . Adhesive [Tape] Rash  . Erythromycin Diarrhea  . Hydromorphone Other (See Comments)    Severe Lethargy   . Oxycodone-Acetaminophen Other (See Comments)    Confusion   . Percocet [Oxycodone-Acetaminophen] Other (See Comments)    Confusion     Social History:   Social History   Socioeconomic History  . Marital status: Widowed    Spouse name: Not on file  . Number of children: Not on file  . Years of education: Not on file  . Highest education level: Not on file  Occupational History  . Occupation: Retired    Fish farm manager: RETIRED  Social Needs  . Financial resource strain: Not on file  . Food insecurity:    Worry: Not on file    Inability: Not on file  . Transportation needs:    Medical: Not on file    Non-medical: Not on file  Tobacco Use  . Smoking status: Never Smoker  . Smokeless tobacco: Never Used  Substance and Sexual Activity  . Alcohol use: No  . Drug use: No  . Sexual activity: Not on file  Lifestyle  . Physical activity:    Days per week: Not on file    Minutes per session: Not on file  . Stress: Not on file  Relationships  . Social connections:    Talks on phone: Not on file    Gets together: Not on file    Attends religious service: Not on file    Active member of club or organization: Not on file    Attends meetings of clubs or organizations: Not on file    Relationship status: Not on file  . Intimate partner violence:    Fear of current or ex partner: Not on file    Emotionally abused: Not on file    Physically abused: Not on file    Forced sexual activity: Not on file  Other Topics Concern  . Not on file  Social History Narrative  Tobacco use cigarettes: never smoked, tobacco history last updated 12/23/2013. No smoking. No alcohol. Caffeine: yes, coffee, 3 servings daily.recreational drug use: never. No diet. Exercise: walks  everyday. Occupation: retired. Marital status: married. Children: boys, 2 girls, 2. Seat belt use: yes.    Family History:   The patient's family history includes Breast cancer in her maternal grandmother; Cancer in her father; Hypertension in her mother.    ROS:  Please see the history of present illness.  All other ROS reviewed and negative.     Physical Exam/Data:   Vitals:   01/27/18 0159 01/27/18 0230 01/27/18 0300 01/27/18 0330  BP: (!) 142/76 139/73 131/75 126/70  Pulse: (!) 58 68 61 64  Resp: 11 15 12 15   Temp:      TempSrc:      SpO2: 100% 100% 98% 93%  Weight:      Height:       No intake or output data in the 24 hours ending 01/27/18 0356 Filed Weights   01/27/18 0102  Weight: 95.3 kg   Body mass index is 39.68 kg/m.  General:  Well nourished, well developed, in no acute distress HEENT: normal Lymph: no adenopathy Neck: no JVD Endocrine:  No thryomegaly Vascular: No carotid bruits; FA pulses 2+ bilaterally without bruits  Cardiac:  normal S1, S2; RRR; no murmur  Lungs:  clear to auscultation bilaterally, no wheezing, rhonchi or rales  Abd: soft, nontender, no hepatomegaly  Ext: 1+ chronic edema to knees bilaterally.  Musculoskeletal:  No deformities, BUE and BLE strength normal and equal Skin: warm and dry  Neuro:  CNs 2-12 intact, no focal abnormalities noted Psych:  Normal affect    EKG:  NSR, normal (personally reviewed)  Laboratory Data:  Chemistry Recent Labs  Lab 01/27/18 0103 01/27/18 0151  NA 142 141  K 3.3* 3.4*  CL 105 107  CO2 23  --   GLUCOSE 111* 103*  BUN 16 20  CREATININE 0.96 0.90  CALCIUM 9.1  --   GFRNONAA 56*  --   GFRAA >60  --   ANIONGAP 14  --     No results for input(s): PROT, ALBUMIN, AST, ALT, ALKPHOS, BILITOT in the last 168 hours. Hematology Recent Labs  Lab 01/27/18 0103 01/27/18 0151  WBC 3.6*  --   RBC 3.35*  --   HGB 9.6* 9.9*  HCT 31.4* 29.0*  MCV 93.7  --   MCH 28.7  --   MCHC 30.6  --   RDW  15.9*  --   PLT 297  --    Cardiac EnzymesNo results for input(s): TROPONINI in the last 168 hours.  Recent Labs  Lab 01/27/18 0130  TROPIPOC 1.21*    BNPNo results for input(s): BNP, PROBNP in the last 168 hours.  DDimer No results for input(s): DDIMER in the last 168 hours.  Radiology/Studies:  Dg Chest 2 View  Result Date: 01/27/2018 CLINICAL DATA:  Chest pain onset at 2330 hours EXAM: CHEST - 2 VIEW COMPARISON:  09/30/2017 FINDINGS: Cardiomegaly. No pulmonary consolidation or overt pulmonary edema. No effusion. Degenerative changes are present about the [facility] and glenohumeral joints bilaterally and along the thoracic spine. IMPRESSION: No active cardiopulmonary disease.  Cardiomegaly. Electronically Signed   By: Ashley Royalty M.D.   On: 01/27/2018 01:30   Ct Angio Chest/abd/pel For Dissection W And/or Wo Contrast  Result Date: 01/27/2018 CLINICAL DATA:  Initial evaluation for acute chest pain radiating to shoulders and back. EXAM: CT ANGIOGRAPHY CHEST, ABDOMEN  AND PELVIS TECHNIQUE: Multidetector CT imaging through the chest, abdomen and pelvis was performed using the standard protocol during bolus administration of intravenous contrast. Multiplanar reconstructed images and MIPs were obtained and reviewed to evaluate the vascular anatomy. CONTRAST:  178m ISOVUE-370 IOPAMIDOL (ISOVUE-370) INJECTION 76% COMPARISON:  Prior radiograph from earlier same day as well as prior CT from 07/31/2017. FINDINGS: CTA CHEST FINDINGS Cardiovascular: Precontrast imaging through the intrathoracic aorta at demonstrates no mural thrombus or other acute abnormality. Postcontrast imaging demonstrates no evidence for dissection or other abnormality. No aneurysm. Partially visualized great vessels within normal limits. Right circumflex coronary stent noted. Cardiomegaly. No pericardial effusion. Limited evaluation of the pulmonary arterial tree within normal limits. Mediastinum/Nodes: 14 mm hypodense right thyroid nodule  noted, stable from previous. Visualized thyroid otherwise unremarkable. No enlarged mediastinal, hilar, or axillary adenopathy. Esophagus within normal limits. Lungs/Pleura: Tracheobronchial tree intact and patent. Lungs well inflated bilaterally. No focal infiltrates. No pulmonary edema or pleural effusion. No pneumothorax. Previously referenced nodule at the medial aspect of the right lower lobe measures similar in size at 16 x 13 mm (series 8, image 108). Additional somewhat oblong nodular density within the left lingula also similar measuring 19 x 8 mm (series 8, image 104). There is a new lobulated somewhat necrotic appearing nodule within the peripheral aspect of the superior right lower lobe measuring 19 x 14 mm (series 8, image 89). Multiple additional scattered subcentimeter nodules involving the bilateral lungs are otherwise grossly similar to previous. Musculoskeletal: External soft tissues demonstrate no acute finding. No acute osseous abnormality. No worrisome lytic or blastic osseous lesions. Multilevel degenerative spurring noted throughout the thoracic spine. Degenerative changes noted about the shoulders bilaterally, right worse than left. Review of the MIP images confirms the above findings. CTA ABDOMEN AND PELVIS FINDINGS VASCULAR Aorta: Normal intravascular enhancement seen throughout the intra-abdominal aorta. No evidence for dissection or aneurysm. Celiac: Celiac axis and its branch vessels are widely patent. SMA: SMA widely patent to its distal aspect. Renals: Single renal arteries present bilaterally, both of which are widely patent. Focal plaque at the origin of the left renal artery without significant stenosis noted. IMA: IMA widely patent. Inflow: Iliac arteries widely patent bilaterally without aneurysm or flow-limiting stenosis. Veins: No appreciable venous abnormality. Review of the MIP images confirms the above findings. NON-VASCULAR Hepatobiliary: Multiple scattered cysts noted  within the liver, largest of which measures 3.3 cm positioned within the inferior right hepatic lobe. Liver otherwise unremarkable. Gallbladder appears to be absent. No biliary dilatation. Pancreas: Diffuse fatty infiltration the pancreas noted. Pancreas otherwise unremarkable. Spleen: Spleen within normal limits. Adrenals/Urinary Tract: Adrenal glands are normal. Kidneys fairly equal in size with symmetric enhancement. Scattered renal cysts noted bilaterally, largest of which on the right position within the lower pole measures 3 cm. Largest on the left positioned within the interpolar region and measures 4.1 cm. These are grossly similar to previous, although not well assessed on this examination due to streak artifact from spinal fusion hardware and technique. Scattered fat necrosis seen within the perirenal fat lateral to the left kidney, of uncertain significance. No nephrolithiasis or hydronephrosis. No appreciable focal enhancing renal mass. No hydroureter. Partially distended bladder within normal limits. Stomach/Bowel: Stomach within normal limits. No evidence for bowel obstruction. Colonic diverticulosis without evidence for acute diverticulitis. No acute inflammatory changes seen about the bowels. Lymphatic: No adenopathy. Reproductive: Uterus is absent. Normal left ovary. Right ovary not visualized. No adnexal mass. Other: No free air or fluid. Sequelae of prior ventral  hernia repair. Musculoskeletal: No acute osseous abnormality. No discrete lytic or blastic osseous lesions. Prior posterior fusion at L3 through L5. Review of the MIP images confirms the above findings. IMPRESSION: 1. No CTA evidence for dissection or other acute aortic pathology. No aneurysm. 2. No other acute abnormality identified within the chest, abdomen, and pelvis. 3. Area of fat necrosis involving the left. Renal fat, of uncertain etiology, but new relative to most recent CT from 06/17/2016. Correlation with history for possible  previous intervention or other abnormality at this location. 4. Colonic diverticulosis without evidence for acute diverticulitis. 5. **An incidental finding of potential clinical significance has been found. New 19 mm lobulated nodule at the peripheral aspect of the right lower lobe, indeterminate. Remaining pulmonary nodules grossly similar to previous studies. Consider one of the following in 3 months for both low-risk and high-risk individuals: (a) repeat chest CT, (b) follow-up PET-CT, or (c) tissue sampling. This recommendation follows the consensus statement: Guidelines for Management of Incidental Pulmonary Nodules Detected on CT Images: From the Fleischner Society 2017; Radiology 2017; 284:228-243.** Electronically Signed   By: Jeannine Boga M.D.   On: 01/27/2018 03:47    Assessment and Plan:   1. CAD: NSTEMI with TnI 1.2, no acute ECG changes.  She has prior history of inferior MI in 2012 with DES x 2 to RCA. CT chest negative for aortic dissection.  She has history of prior DVT, CTA was not timed for PE but the visualized pulmonary tree was within normal limits.  - ASA 81 - Statin  - heparin gtt - Will use metoprolol 25 mg po every 8 hrs.  - losartan 25 mg daily.  - Will keep NPO for cath later this am. Discussed risks/benefits of cath with patient and she agrees to proceed.  2. HTN: Losartan and metoprolol as above.  3. Ulcerative colitis: Not currently on prednisone.  4. Chronic diastolic CHF: No JVD but significant lower extremity edema.  Can continue po Lasix.  5. History of DVT: Covering with heparin gtt for now.   Signed, Loralie Champagne, MD  01/27/2018 3:56 AM

## 2018-01-28 ENCOUNTER — Other Ambulatory Visit: Payer: Self-pay

## 2018-01-28 ENCOUNTER — Encounter (HOSPITAL_COMMUNITY): Payer: Self-pay | Admitting: Interventional Cardiology

## 2018-01-28 LAB — CBC
HEMATOCRIT: 29.7 % — AB (ref 36.0–46.0)
HEMOGLOBIN: 8.7 g/dL — AB (ref 12.0–15.0)
MCH: 27.9 pg (ref 26.0–34.0)
MCHC: 29.3 g/dL — AB (ref 30.0–36.0)
MCV: 95.2 fL (ref 78.0–100.0)
PLATELETS: 283 10*3/uL (ref 150–400)
RBC: 3.12 MIL/uL — AB (ref 3.87–5.11)
RDW: 16.2 % — AB (ref 11.5–15.5)
WBC: 4.2 10*3/uL (ref 4.0–10.5)

## 2018-01-28 MED ORDER — PREDNISOLONE ACETATE 1 % OP SUSP
1.0000 [drp] | Freq: Every day | OPHTHALMIC | Status: DC
  Administered 2018-01-28 – 2018-01-29 (×2): 1 [drp] via OPHTHALMIC
  Filled 2018-01-28: qty 5

## 2018-01-28 MED ORDER — INFLUENZA VAC SPLIT HIGH-DOSE 0.5 ML IM SUSY
0.5000 mL | PREFILLED_SYRINGE | INTRAMUSCULAR | Status: AC
  Administered 2018-01-29: 0.5 mL via INTRAMUSCULAR
  Filled 2018-01-28: qty 0.5

## 2018-01-28 NOTE — Progress Notes (Signed)
Spoke with pharmacy about pt's concern with heparin sq.  Per pt  Visit last June after receiving heparin sq shots had black purplish  spots at injection sites and other sites that were not from  Heparin injections   They were non itching or painful.  Per pharmacy ok to give heparin sq at this time. Will continue to monitor. Isabella [name]

## 2018-01-28 NOTE — Plan of Care (Signed)
  Problem: Education: Goal: Knowledge of General Education information will improve Description Including pain rating scale, medication(s)/side effects and non-pharmacologic comfort measures Outcome: Progressing   Problem: Health Behavior/Discharge Planning: Goal: Ability to manage health-related needs will improve Outcome: Progressing   Problem: Clinical Measurements: Goal: Ability to maintain clinical measurements within normal limits will improve Outcome: Progressing Goal: Will remain free from infection Outcome: Progressing Goal: Diagnostic test results will improve Outcome: Progressing Goal: Respiratory complications will improve Outcome: Progressing Goal: Cardiovascular complication will be avoided Outcome: Progressing   Problem: Activity: Goal: Risk for activity intolerance will decrease Outcome: Progressing   Problem: Nutrition: Goal: Adequate nutrition will be maintained Outcome: Progressing   Problem: Coping: Goal: Level of anxiety will decrease Outcome: Progressing   Problem: Elimination: Goal: Will not experience complications related to bowel motility Outcome: Progressing Goal: Will not experience complications related to urinary retention Outcome: Progressing   Problem: Pain Managment: Goal: General experience of comfort will improve Outcome: Progressing   Problem: Skin Integrity: Goal: Risk for impaired skin integrity will decrease Outcome: Progressing   Problem: Education: Goal: Understanding of CV disease, CV risk reduction, and recovery process will improve Outcome: Progressing Goal: Individualized Educational Video(s) Outcome: Progressing   Problem: Activity: Goal: Ability to return to baseline activity level will improve Outcome: Progressing   Problem: Cardiovascular: Goal: Ability to achieve and maintain adequate cardiovascular perfusion will improve Outcome: Progressing Goal: Vascular access site(s) Level 0-1 will be  maintained Outcome: Progressing   Problem: Health Behavior/Discharge Planning: Goal: Ability to safely manage health-related needs after discharge will improve Outcome: Progressing

## 2018-01-28 NOTE — Progress Notes (Addendum)
Progress Note  Patient Name: Isabella [name] Date of Encounter: 01/28/2018  Primary Cardiologist: Belva Crome III, MD   Subjective   Pt denies chest pain or SOB. Reports concern with longstanding LE swelling.   Inpatient Medications    Scheduled Meds: . atorvastatin  80 mg Oral QHS  . Chlorhexidine Gluconate Cloth  6 each Topical Q0600  . furosemide  40 mg Oral Daily  . heparin injection (subcutaneous)  5,000 Units Subcutaneous Q8H  . [START ON 01/29/2018] Influenza vac split quadrivalent PF  0.5 mL Intramuscular Tomorrow-1000  . isosorbide mononitrate  30 mg Oral Daily  . losartan  50 mg Oral Daily  . mesalamine  2.4 g Oral Daily  . metoprolol tartrate  25 mg Oral Q8H  . mupirocin ointment  1 application Nasal BID  . potassium chloride SA  20 mEq Oral Q supper  . sodium chloride flush  3 mL Intravenous Q12H  . Tofacitinib Citrate  10 mg Oral BID   Continuous Infusions: . sodium chloride     PRN Meds: sodium chloride, acetaminophen, acetaminophen, nitroGLYCERIN, ondansetron (ZOFRAN) IV, sodium chloride flush   Vital Signs    Vitals:   01/28/18 0352 01/28/18 0428 01/28/18 0732 01/28/18 0807  BP: 112/68 112/68 117/68 117/66  Pulse: (!) 57 64 (!) 56 82  Resp: 17  17 12   Temp: 98.3 F (36.8 C)   98.2 F (36.8 C)  TempSrc: Axillary  Oral Oral  SpO2: 100%  99% 97%  Weight: 94.7 kg     Height:        Intake/Output Summary (Last 24 hours) at 01/28/2018 0909 Last data filed at 01/28/2018 0400 Gross per 24 hour  Intake 416.25 ml  Output 1750 ml  Net -1333.75 ml   Filed Weights   01/27/18 0102 01/28/18 0352  Weight: 95.3 kg 94.7 kg    Physical Exam   General: Obese,  NAD Skin: Warm, dry, intact  Head: Normocephalic, atraumatic, clear, moist mucus membranes. Neck: Negative for carotid bruits. No JVD Lungs:Clear to ausculation bilaterally. No wheezes, rales, or rhonchi. Breathing is unlabored. Cardiovascular: RRR with S1 S2. No murmurs, rubs, gallops, or LV  heave appreciated. Abdomen: Soft, non-tender, non-distended with normoactive bowel sounds. No obvious abdominal masses. MSK: Strength and tone appear normal for age. 5/5 in all extremities Extremities: 2+ BLE edema. No clubbing or cyanosis. DP/PT pulses 2+ bilaterally Neuro: Alert and oriented. No focal deficits. No facial asymmetry. MAE spontaneously. Psych: Responds to questions appropriately with normal affect.    Labs    Chemistry Recent Labs  Lab 01/27/18 0103 01/27/18 0151 01/27/18 0806  NA 142 141 143  K 3.3* 3.4* 4.1  CL 105 107 109  CO2 23  --  26  GLUCOSE 111* 103* 91  BUN 16 20 13   CREATININE 0.96 0.90 0.92  CALCIUM 9.1  --  8.6*  GFRNONAA 56*  --  59*  GFRAA >60  --  >60  ANIONGAP 14  --  8     Hematology Recent Labs  Lab 01/27/18 0103 01/27/18 0151 01/27/18 0806 01/28/18 0343  WBC 3.6*  --  4.3 4.2  RBC 3.35*  --  3.29* 3.12*  HGB 9.6* 9.9* 9.3* 8.7*  HCT 31.4* 29.0* 31.3* 29.7*  MCV 93.7  --  95.1 95.2  MCH 28.7  --  28.3 27.9  MCHC 30.6  --  29.7* 29.3*  RDW 15.9*  --  16.1* 16.2*  PLT 297  --  285 283  Cardiac Enzymes Recent Labs  Lab 01/27/18 0447 01/27/18 0806 01/27/18 1730  TROPONINI 21.44* 22.15* 12.98*    Recent Labs  Lab 01/27/18 0130  TROPIPOC 1.21*     BNPNo results for input(s): BNP, PROBNP in the last 168 hours.   DDimer No results for input(s): DDIMER in the last 168 hours.   Radiology    Dg Chest 2 View  Result Date: 01/27/2018 CLINICAL DATA:  Chest pain onset at 2330 hours EXAM: CHEST - 2 VIEW COMPARISON:  09/30/2017 FINDINGS: Cardiomegaly. No pulmonary consolidation or overt pulmonary edema. No effusion. Degenerative changes are present about the [facility] and glenohumeral joints bilaterally and along the thoracic spine. IMPRESSION: No active cardiopulmonary disease.  Cardiomegaly. Electronically Signed   By: Ashley Royalty M.D.   On: 01/27/2018 01:30   Ct Angio Chest/abd/pel For Dissection W And/or Wo Contrast  Result  Date: 01/27/2018 CLINICAL DATA:  Initial evaluation for acute chest pain radiating to shoulders and back. EXAM: CT ANGIOGRAPHY CHEST, ABDOMEN AND PELVIS TECHNIQUE: Multidetector CT imaging through the chest, abdomen and pelvis was performed using the standard protocol during bolus administration of intravenous contrast. Multiplanar reconstructed images and MIPs were obtained and reviewed to evaluate the vascular anatomy. CONTRAST:  131m ISOVUE-370 IOPAMIDOL (ISOVUE-370) INJECTION 76% COMPARISON:  Prior radiograph from earlier same day as well as prior CT from 07/31/2017. FINDINGS: CTA CHEST FINDINGS Cardiovascular: Precontrast imaging through the intrathoracic aorta at demonstrates no mural thrombus or other acute abnormality. Postcontrast imaging demonstrates no evidence for dissection or other abnormality. No aneurysm. Partially visualized great vessels within normal limits. Right circumflex coronary stent noted. Cardiomegaly. No pericardial effusion. Limited evaluation of the pulmonary arterial tree within normal limits. Mediastinum/Nodes: 14 mm hypodense right thyroid nodule noted, stable from previous. Visualized thyroid otherwise unremarkable. No enlarged mediastinal, hilar, or axillary adenopathy. Esophagus within normal limits. Lungs/Pleura: Tracheobronchial tree intact and patent. Lungs well inflated bilaterally. No focal infiltrates. No pulmonary edema or pleural effusion. No pneumothorax. Previously referenced nodule at the medial aspect of the right lower lobe measures similar in size at 16 x 13 mm (series 8, image 108). Additional somewhat oblong nodular density within the left lingula also similar measuring 19 x 8 mm (series 8, image 104). There is a new lobulated somewhat necrotic appearing nodule within the peripheral aspect of the superior right lower lobe measuring 19 x 14 mm (series 8, image 89). Multiple additional scattered subcentimeter nodules involving the bilateral lungs are otherwise  grossly similar to previous. Musculoskeletal: External soft tissues demonstrate no acute finding. No acute osseous abnormality. No worrisome lytic or blastic osseous lesions. Multilevel degenerative spurring noted throughout the thoracic spine. Degenerative changes noted about the shoulders bilaterally, right worse than left. Review of the MIP images confirms the above findings. CTA ABDOMEN AND PELVIS FINDINGS VASCULAR Aorta: Normal intravascular enhancement seen throughout the intra-abdominal aorta. No evidence for dissection or aneurysm. Celiac: Celiac axis and its branch vessels are widely patent. SMA: SMA widely patent to its distal aspect. Renals: Single renal arteries present bilaterally, both of which are widely patent. Focal plaque at the origin of the left renal artery without significant stenosis noted. IMA: IMA widely patent. Inflow: Iliac arteries widely patent bilaterally without aneurysm or flow-limiting stenosis. Veins: No appreciable venous abnormality. Review of the MIP images confirms the above findings. NON-VASCULAR Hepatobiliary: Multiple scattered cysts noted within the liver, largest of which measures 3.3 cm positioned within the inferior right hepatic lobe. Liver otherwise unremarkable. Gallbladder appears to be absent. No biliary dilatation.  Pancreas: Diffuse fatty infiltration the pancreas noted. Pancreas otherwise unremarkable. Spleen: Spleen within normal limits. Adrenals/Urinary Tract: Adrenal glands are normal. Kidneys fairly equal in size with symmetric enhancement. Scattered renal cysts noted bilaterally, largest of which on the right position within the lower pole measures 3 cm. Largest on the left positioned within the interpolar region and measures 4.1 cm. These are grossly similar to previous, although not well assessed on this examination due to streak artifact from spinal fusion hardware and technique. Scattered fat necrosis seen within the perirenal fat lateral to the left  kidney, of uncertain significance. No nephrolithiasis or hydronephrosis. No appreciable focal enhancing renal mass. No hydroureter. Partially distended bladder within normal limits. Stomach/Bowel: Stomach within normal limits. No evidence for bowel obstruction. Colonic diverticulosis without evidence for acute diverticulitis. No acute inflammatory changes seen about the bowels. Lymphatic: No adenopathy. Reproductive: Uterus is absent. Normal left ovary. Right ovary not visualized. No adnexal mass. Other: No free air or fluid. Sequelae of prior ventral hernia repair. Musculoskeletal: No acute osseous abnormality. No discrete lytic or blastic osseous lesions. Prior posterior fusion at L3 through L5. Review of the MIP images confirms the above findings. IMPRESSION: 1. No CTA evidence for dissection or other acute aortic pathology. No aneurysm. 2. No other acute abnormality identified within the chest, abdomen, and pelvis. 3. Area of fat necrosis involving the left. Renal fat, of uncertain etiology, but new relative to most recent CT from 06/17/2016. Correlation with history for possible previous intervention or other abnormality at this location. 4. Colonic diverticulosis without evidence for acute diverticulitis. 5. **An incidental finding of potential clinical significance has been found. New 19 mm lobulated nodule at the peripheral aspect of the right lower lobe, indeterminate. Remaining pulmonary nodules grossly similar to previous studies. Consider one of the following in 3 months for both low-risk and high-risk individuals: (a) repeat chest CT, (b) follow-up PET-CT, or (c) tissue sampling. This recommendation follows the consensus statement: Guidelines for Management of Incidental Pulmonary Nodules Detected on CT Images: From the Fleischner Society 2017; Radiology 2017; 284:228-243.** Electronically Signed   By: Jeannine Boga M.D.   On: 01/27/2018 03:47   Telemetry    01/28/18 NSR/SB - Personally  Reviewed  ECG    No new tracings as of 01/28/18 - Personally Reviewed  Cardiac Studies   Echocardiogram 01/27/18: Study Conclusions  - Left ventricle: The cavity size was normal. Systolic function was   normal. The estimated ejection fraction was in the range of 60%   to 65%. Wall motion was normal; there were no regional wall   motion abnormalities. Doppler parameters are consistent with   abnormal left ventricular relaxation (grade 1 diastolic   dysfunction). Doppler parameters are consistent with   indeterminate ventricular filling pressure. - Aortic valve: Transvalvular velocity was within the normal range.   There was no stenosis. There was no regurgitation. - Mitral valve: Transvalvular velocity was within the normal range.   There was no evidence for stenosis. There was mild regurgitation. - Left atrium: The atrium was moderately dilated. - Right ventricle: The cavity size was normal. Wall thickness was   normal. Systolic function was normal. - Tricuspid valve: There was mild regurgitation. - Pulmonary arteries: Systolic pressure was within the normal   range. PA peak pressure: 24 mm Hg (S).  Cardiac catheterization 01/27/18:  Normal left main.  Large widely patent LAD without obstruction.  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  TIMI grade III flow noted.  Large dominant right coronary without obstructive disease. 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  Widely patent ostial RCA stent which was placed due to catheter induced coronary dissection 2012.  Overall normal LV function with EF 55%.  Mildly elevated LVEDP.  RECOMMENDATIONS:   Discontinue IV heparin.  High intensity statin therapy.  Continue low-dose long-acting nitrates.  Aspirin.  No indication for dual  antiplatelet therapy.  Progression based on clinical course.  Significant bilateral lower extremity edema of uncertain etiology.  Consider bilateral lower extremity Doppler studies with mapping to exclude postphlebitic syndrome.  Recommend Aspirin 77m daily for moderate CAD.  Patient Profile     77y.o. female with history of CAD s/p MI with DESx2 to the RCA 2012, ulcerative colitis, HTN, diastolic CHF and sarcoidosis was seen in the ER for chest pain and was found to have NSTEMI.   Assessment & Plan    1. [facility]: [facility] found to have elevated trop on presentation  -Cardiac catheterization on 01/27/18 with spontaneous coronary dissection of the first obtuse marginal branch, starting in the proximal vessel and extending to small branches distally>>>likely the culprit of her pain. There was patent LM, LAD and RCA with patent stent from SCAD in 2012>>estimated LVEF 55% -Trop, 21.44>22.15>12.98>>trending down>>no anginal symptoms  -Left radial cath site with moderate bruising and swelling   -Hb, 8.7 today>>monitor -Continue ASA daily for moderate CAD, metoprolol, losartan, high intensity statin  -Imdur 379mdaily started post-cath   2. Anemia: -Hb, 8.7 today post cath>>was 9.3 on 01/27/18 -No s/s of acute bleeding -Will follow with daily CBC   3. LE swelling: -Pt reports chronic LE swelling in which she has tried increasing her Lasix per PCP, LE elevation, TED hose without relief -Denies claudication  -Consider LE study for further evaluation given ongoing concern   4. HTN: -Stable, 117/66>117/68>112/68 -Continue current regimen   5. Ulcerative colitis: -Stable, no exacerbation   6. Chronic diastolic CHF: -Last echocardiogram 01/27/18 with normal LVEF and G1DD -Weight, 208lb today, 210lb on admission  -I&O, net negative 1.3L -Continue Lasix 406mO daily  -Reports PCP has tried adjusting her Lasix dose for chronic LE swelling  without resolution  7. Hx of DVT: -Not currently on anticoagulation -Covered with IV Hep gtt on admission    Signed, JilKathyrn Drown-[facility] Pager: [phone]/[phone], 9:09 AM     For questions or updates, please contact   Please consult [email or web address] for contact info under Cardiology/STEMI.  Patient examined chart reviewed. No angina Has ambulated cath sight ok no murmur she has chronic LE lymphedema and likely venous reflux Has had 2 duplex with no DVT. Increase lasix to 40 am and 20 mg PM  Should be ok for d/c in am   PetJenkins Rouge

## 2018-01-28 NOTE — Progress Notes (Signed)
Pt wearing CPAP with home mask and tubing- tolerating well.

## 2018-01-28 NOTE — Discharge Summary (Signed)
Discharge Summary    Patient ID: Isabella [name] MRN: [id number]; DOB: 16-Nov-1940  Admit date: 01/27/2018 Discharge date: 01/29/2018  Primary Care Provider: [name], MD  Primary Cardiologist: Isabella Grooms, MD   Discharge Diagnoses    Principal Problem:   NSTEMI (non-ST elevated myocardial infarction) [facility]) Active Problems:   Hyperlipidemia   Essential hypertension   Obesity (BMI 30-39.9)   Edema of lower extremity   OA (osteoarthritis) of knee   Coronary atherosclerosis of native coronary artery   Old MI (myocardial infarction)   Acute diastolic CHF (congestive heart failure) (HCC)   Lymphedema  Allergies Allergies  Allergen Reactions  . Codeine Shortness Of Breath and Other (See Comments)    Made back hurt; does not affect liver per patient but has has caused difficulty breathing/tolerates Percocet only in small doses  . Imodium A-D [Loperamide Hcl] Shortness Of Breath and Other (See Comments)    Caused a pain & "squeezing" sensation in the lungs also  . Neomycin Swelling and Other (See Comments)    Reaction to eye ointment - eyes became swollen shut Makes wounds ooze  . Penicillinase Hives, Shortness Of Breath and Rash    Has patient had a PCN reaction causing immediate rash, facial/tongue/throat swelling, SOB or lightheadedness with hypotension: Yes Has patient had a PCN reaction causing severe rash involving mucus membranes or skin necrosis: No Has patient had a PCN reaction that required hospitalization:No Has patient had a PCN reaction occurring within the last 10 years: No If all of the above answers are "NO", then may proceed with Cephalosporin use.   Marland Kitchen Penicillins Hives, Shortness Of Breath and Rash    Reaction @ 18-yrs old Has patient had a PCN reaction causing immediate rash, facial/tongue/throat swelling, SOB or lightheadedness with hypotension: Yes Has patient had a PCN reaction causing severe rash involving mucus membranes or skin necrosis: No Has  patient had a PCN reaction that required hospitalization: No Has patient had a PCN reaction occurring within the last 10 years: No If all of the above answers are "NO", then may proceed with Cephalosporin use.  . Tramadol Shortness Of Breath and Other (See Comments)    (Per patient) "felt like lungs were being squeezed"   . Lisinopril Cough  . Hornet Venom Swelling    Swelling at site of sting  . Adhesive [Tape] Rash  . Erythromycin Diarrhea  . Hydromorphone Other (See Comments)    Severe Lethargy   . Oxycodone-Acetaminophen Other (See Comments)    Confusion   . Percocet [Oxycodone-Acetaminophen] Other (See Comments)    Confusion    Diagnostic Studies/Procedures    Echocardiogram 01/27/18: Study Conclusions  - Left ventricle: The cavity size was normal. Systolic function was normal. The estimated ejection fraction was in the range of 60% to 65%. Wall motion was normal; there were no regional wall motion abnormalities. Doppler parameters are consistent with abnormal left ventricular relaxation (grade 1 diastolic dysfunction). Doppler parameters are consistent with indeterminate ventricular filling pressure. - Aortic valve: Transvalvular velocity was within the normal range. There was no stenosis. There was no regurgitation. - Mitral valve: Transvalvular velocity was within the normal range. There was no evidence for stenosis. There was mild regurgitation. - Left atrium: The atrium was moderately dilated. - Right ventricle: The cavity size was normal. Wall thickness was normal. Systolic function was normal. - Tricuspid valve: There was mild regurgitation. - Pulmonary arteries: Systolic pressure was within the normal range. PA peak pressure: 24 mm Hg (S).  Cardiac catheterization 01/27/18:  Normal left main.  Large widely patent LAD without obstruction.  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 TIMI grade III flow noted.  Large dominant right coronary without obstructive disease. 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 Widely patent ostial RCA stent which was placed due to catheter induced coronary dissection 2012.  Overall normal LV function with EF 55%. Mildly elevated LVEDP.  RECOMMENDATIONS:   Discontinue IV heparin.  High intensity statin therapy.  Continue low-dose long-acting nitrates.  Aspirin. No indication for dual antiplatelet therapy.  Progression based on clinical course.  Significant bilateral lower extremity edema of uncertain etiology. Consider bilateral lower extremity Doppler studies with mapping to exclude postphlebitic syndrome.  Recommend Aspirin 3m daily for moderate CAD.  History of Present Illness    Ms. Isabella [name] a 77yo with history of CAD s/p STEMI in 2012 treated with DES to the RCA x2 proximal for catheter induced dissection distal to treat acute lesion, ulcerative colitis, HTN, HLD, chronic diastolic CHF, chronic LE swelling and sarcoidosis [facility] and was found to have NSTEMI.   In 2012, she had an MI with DES x 2 to the RCA (proximal for catheter-induced dissection and distal to treat acute lesion). She has been treated more recently for UC with prednisone, now off.  She has had lung nodules with biopsy-proven sarcoidosis. Finally, she has history of RLE DVT, not currently anticoagulated.  Echo in 6/19 showed EF 60-65%.  Approximately 11:30 pm on day of admission, pt reported that she woke from her sleep with chest pressure, however was more prominent across her upper back. Pain was reported as not pleuritic. Given her hx and symptoms, she reported to ED for further evaluation.   In the ED, troponin level was noted to be elevated at 21.44>22.15>12.98. EKG without  acute changes, similar to prior tracings. She was given SL NTG on presentation without relief, however her pain was relieved with fentanyl. CTA completed to r/o aortic dissection which was found to be negative.   Plan was for Cardiology to admit and she was scheduled for cardiac catheterization on 01/27/18.   Hospital Course     On 01/27/18, she was taken to the cardiac cath lab which showed normal left main, large widely patent LAD without obstruction, spontaneous coronary dissection of the first obtuse marginal branch, starting in the proximal vessel and extending to small branches distally, large dominant right coronary without obstructive disease. Per cath note, the continuation of the right coronary which was the site of SCAD in 2012 has completely healed and has normal appearance.  She had widely patent ostial RCA stent which was placed due to catheter induced coronary dissection 2012. Overall normal LV function with EF 55% and mildly elevated LVEDP.   Recommendations were made to start high intensity statin, add long acting nitrates, ASA for moderate CAD and consideration of lower extremity doppler studies with mapping to exclude postphlebitic syndrome. Will resume her Lasix 446mdaily. Encouraged LE elevation, TED hose and increase physical activity for chronic BLE lymphedema. Previous studies with superficial phlebitis and no evidence of DVT. Consider referral for PT for chronic lymphedema.   Other hospital problems include:  1. Anemia: -Hb, 8.8 on day of discharge>>recent baseline appears to be in the 9.3-9.7 range -No s/s of  acute bleeding -Will need CBC at follow up   2. Chronic LE swelling/lymphedema: -Pt reports chronic LE swelling in which she has tried increasing her Lasix per PCP, LE elevation, TED hose without relief -Denies claudication  -LE dopplers-right 05/2017 without DVT -Consider OP LE duplex study for further assessment -Continue Lasix 2m daily -PT evaluation  prior to discharge with no recommendations for home PT assessment at this time    3. HTN: -Stable, 149/77>130/51>120/70 -Continue current regimen   4. Ulcerative colitis: -Stable, no exacerbation  -Continue home regimen   5. Chronic diastolic CHF: -Last echocardiogram 01/27/18 with normal LVEF and G1DD -Weight, 208lb on 01/28/18, 210lb on admission  -I&O, net negative 2.3L -Continue Lasix 425mPO daily  -Reports PCP has tried adjusting her Lasix dose for chronic LE swelling without resolution -MD feels that chronic LE swelling not in relation to diastolic failure   6. Hx of DVT: -Not currently on anticoagulation  Consultants: None   The patient was seen and examined by Dr. NiJohnsie Cancelho feels that she is stable and ready for discharge today, 01/29/18.  _____________  Discharge Vitals Blood pressure (!) 149/77, pulse 62, temperature 98.2 F (36.8 C), temperature source Oral, resp. rate 16, height 5' 1"  (1.549 m), weight 94.7 kg, SpO2 100 %.  Filed Weights   01/27/18 0102 01/28/18 0352  Weight: 95.3 kg 94.7 kg   Labs & Radiologic Studies    CBC Recent Labs    01/28/18 0343 01/29/18 0237  WBC 4.2 3.4*  HGB 8.7* 8.8*  HCT 29.7* 28.8*  MCV 95.2 94.4  PLT 283 26297 Basic Metabolic Panel Recent Labs    01/27/18 0806 01/29/18 0237  NA 143 143  K 4.1 4.0  CL 109 110  CO2 26 27  GLUCOSE 91 98  BUN 13 15  CREATININE 0.92 0.92  CALCIUM 8.6* 8.5*   Cardiac Enzymes Recent Labs    01/27/18 0447 01/27/18 0806 01/27/18 1730  TROPONINI 21.44* 22.15* 12.98*   Fasting Lipid Panel Recent Labs    01/27/18 0806  CHOL 113  HDL 60  LDLCALC 46  TRIG 35  CHOLHDL 1.9  ___________  Dg Chest 2 View  Result Date: 01/27/2018 CLINICAL DATA:  Chest pain onset at 2330 hours EXAM: CHEST - 2 VIEW COMPARISON:  09/30/2017 FINDINGS: Cardiomegaly. No pulmonary consolidation or overt pulmonary edema. No effusion. Degenerative changes are present about the [facility] gl[facility]um[facility]  joints bilaterally and along the thoracic spine. IMPRESSION: No active cardiopulmonary disease.  Cardiomegaly. Electronically Signed   By: DaAshley Royalty.D.   On: 01/27/2018 01:30   Ct Angio Chest/abd/pel For Dissection W And/or Wo Contrast  Result Date: 01/27/2018 CLINICAL DATA:  Initial evaluation for acute chest pain radiating to shoulders and back. EXAM: CT ANGIOGRAPHY CHEST, ABDOMEN AND PELVIS TECHNIQUE: Multidetector CT imaging through the chest, abdomen and pelvis was performed using the standard protocol during bolus administration of intravenous contrast. Multiplanar reconstructed images and MIPs were obtained and reviewed to evaluate the vascular anatomy. CONTRAST:  [id number]-[id number] IOPAMIDOL (ISOVUE-370) INJECTION 76% COMPARISON:  Prior radiograph from earlier same day as well as prior CT from 07/31/2017. FINDINGS: CTA CHEST FINDINGS Cardiovascular: Precontrast imaging through the intrathoracic aorta at demonstrates no mural thrombus or other acute abnormality. Postcontrast imaging demonstrates no evidence for dissection or other abnormality. No aneurysm. Partially visualized great vessels within normal limits. Right circumflex coronary stent noted. Cardiomegaly. No pericardial effusion. Limited evaluation of the pulmonary arterial tree within normal limits. Mediastinum/Nodes: 14 mm  hypodense right thyroid nodule noted, stable from previous. Visualized thyroid otherwise unremarkable. No enlarged mediastinal, hilar, or axillary adenopathy. Esophagus within normal limits. Lungs/Pleura: Tracheobronchial tree intact and patent. Lungs well inflated bilaterally. No focal infiltrates. No pulmonary edema or pleural effusion. No pneumothorax. Previously referenced nodule at the medial aspect of the right lower lobe measures similar in size at 16 x 13 mm (series 8, image 108). Additional somewhat oblong nodular density within the left lingula also similar measuring 19 x 8 mm (series 8, image 104). There is a new  lobulated somewhat necrotic appearing nodule within the peripheral aspect of the superior right lower lobe measuring 19 x 14 mm (series 8, image 89). Multiple additional scattered subcentimeter nodules involving the bilateral lungs are otherwise grossly similar to previous. Musculoskeletal: External soft tissues demonstrate no acute finding. No acute osseous abnormality. No worrisome lytic or blastic osseous lesions. Multilevel degenerative spurring noted throughout the thoracic spine. Degenerative changes noted about the shoulders bilaterally, right worse than left. Review of the MIP images confirms the above findings. CTA ABDOMEN AND PELVIS FINDINGS VASCULAR Aorta: Normal intravascular enhancement seen throughout the intra-abdominal aorta. No evidence for dissection or aneurysm. Celiac: Celiac axis and its branch vessels are widely patent. SMA: SMA widely patent to its distal aspect. Renals: Single renal arteries present bilaterally, both of which are widely patent. Focal plaque at the origin of the left renal artery without significant stenosis noted. IMA: IMA widely patent. Inflow: Iliac arteries widely patent bilaterally without aneurysm or flow-limiting stenosis. Veins: No appreciable venous abnormality. Review of the MIP images confirms the above findings. NON-VASCULAR Hepatobiliary: Multiple scattered cysts noted within the liver, largest of which measures 3.3 cm positioned within the inferior right hepatic lobe. Liver otherwise unremarkable. Gallbladder appears to be absent. No biliary dilatation. Pancreas: Diffuse fatty infiltration the pancreas noted. Pancreas otherwise unremarkable. Spleen: Spleen within normal limits. Adrenals/Urinary Tract: Adrenal glands are normal. Kidneys fairly equal in size with symmetric enhancement. Scattered renal cysts noted bilaterally, largest of which on the right position within the lower pole measures 3 cm. Largest on the left positioned within the interpolar region and  measures 4.1 cm. These are grossly similar to previous, although not well assessed on this examination due to streak artifact from spinal fusion hardware and technique. Scattered fat necrosis seen within the perirenal fat lateral to the left kidney, of uncertain significance. No nephrolithiasis or hydronephrosis. No appreciable focal enhancing renal mass. No hydroureter. Partially distended bladder within normal limits. Stomach/Bowel: Stomach within normal limits. No evidence for bowel obstruction. Colonic diverticulosis without evidence for acute diverticulitis. No acute inflammatory changes seen about the bowels. Lymphatic: No adenopathy. Reproductive: Uterus is absent. Normal left ovary. Right ovary not visualized. No adnexal mass. Other: No free air or fluid. Sequelae of prior ventral hernia repair. Musculoskeletal: No acute osseous abnormality. No discrete lytic or blastic osseous lesions. Prior posterior fusion at L3 through L5. Review of the MIP images confirms the above findings. IMPRESSION: 1. No CTA evidence for dissection or other acute aortic pathology. No aneurysm. 2. No other acute abnormality identified within the chest, abdomen, and pelvis. 3. Area of fat necrosis involving the left. Renal fat, of uncertain etiology, but new relative to most recent CT from 06/17/2016. Correlation with history for possible previous intervention or other abnormality at this location. 4. Colonic diverticulosis without evidence for acute diverticulitis. 5. **An incidental finding of potential clinical significance has been found. New 19 mm lobulated nodule at the peripheral aspect of the right  lower lobe, indeterminate. Remaining pulmonary nodules grossly similar to previous studies. Consider one of the following in 3 months for both low-risk and high-risk individuals: (a) repeat chest CT, (b) follow-up PET-CT, or (c) tissue sampling. This recommendation follows the consensus statement: Guidelines for Management of  Incidental Pulmonary Nodules Detected on CT Images: From the Fleischner Society 2017; Radiology 2017; 284:228-243.** Electronically Signed   By: Jeannine Boga M.D.   On: 01/27/2018 03:47   Disposition   Pt is being discharged home today in good condition.  Follow-up Plans & Appointments   Follow-up Information    Burtis Junes, NP Follow up on 02/10/2018.   Specialties:  Nurse Practitioner, Interventional Cardiology, Cardiology, Radiology Why:  Your follow up appointment will be on 02/10/18 at 3pm.  Contact information: [place]. [address] [place] [address] [phone]          Discharge Instructions    Call MD for:  difficulty breathing, headache or visual disturbances   Complete by:  As directed    Call MD for:  persistant dizziness or light-headedness   Complete by:  As directed    Call MD for:  persistant nausea and vomiting   Complete by:  As directed    Call MD for:  redness, tenderness, or signs of infection (pain, swelling, redness, odor or green/yellow discharge around incision site)   Complete by:  As directed    Call MD for:  severe uncontrolled pain   Complete by:  As directed    Call MD for:  temperature >100.4   Complete by:  As directed    Diet - low sodium heart healthy   Complete by:  As directed    Discharge instructions   Complete by:  As directed    No driving for 3 days. No lifting over 5 lbs for 1 week. No sexual activity for 1 week. Keep procedure site clean & dry. If you notice increased pain, swelling, bleeding or pus, call/return!  You may shower, but no soaking baths/hot tubs/pools for 1 week.   Continue to elevate lower extremities while sitting, pressure hose and continue your Lasix for lower extremity swelling.   Increase activity slowly   Complete by:  As directed      Discharge Medications   Allergies as of 01/29/2018      Reactions   Codeine Shortness Of Breath, Other (See Comments)   Made back hurt; does not  affect liver per patient but has has caused difficulty breathing/tolerates Percocet only in small doses   Imodium A-d [loperamide Hcl] Shortness Of Breath, Other (See Comments)   Caused a pain & "squeezing" sensation in the lungs also   Neomycin Swelling, Other (See Comments)   Reaction to eye ointment - eyes became swollen shut Makes wounds ooze   Penicillinase Hives, Shortness Of Breath, Rash   Has patient had a PCN reaction causing immediate rash, facial/tongue/throat swelling, SOB or lightheadedness with hypotension: Yes Has patient had a PCN reaction causing severe rash involving mucus membranes or skin necrosis: No Has patient had a PCN reaction that required hospitalization:No Has patient had a PCN reaction occurring within the last 10 years: No If all of the above answers are "NO", then may proceed with Cephalosporin use.   Penicillins Hives, Shortness Of Breath, Rash   Reaction @ 18-yrs old Has patient had a PCN reaction causing immediate rash, facial/tongue/throat swelling, SOB or lightheadedness with hypotension: Yes Has patient had a PCN reaction causing severe rash involving  mucus membranes or skin necrosis: No Has patient had a PCN reaction that required hospitalization: No Has patient had a PCN reaction occurring within the last 10 years: No If all of the above answers are "NO", then may proceed with Cephalosporin use.   Tramadol Shortness Of Breath, Other (See Comments)   (Per patient) "felt like lungs were being squeezed"   Lisinopril Cough   Hornet Venom Swelling   Swelling at site of sting   Adhesive [tape] Rash   Erythromycin Diarrhea   Hydromorphone Other (See Comments)   Severe Lethargy    Oxycodone-acetaminophen Other (See Comments)   Confusion   Percocet [oxycodone-acetaminophen] Other (See Comments)   Confusion      Medication List    TAKE these medications   acetaminophen 500 MG tablet Commonly known as:  TYLENOL Take 500 mg by mouth every 6 (six)  hours as needed for mild pain.   aspirin EC 81 MG tablet Take 81 mg by mouth daily at 3 pm.   atorvastatin 80 MG tablet Commonly known as:  LIPITOR Take 1 tablet (80 mg total) by mouth daily. What changed:    medication strength  how much to take  when to take this   CALCIUM + D3 PO Take 1 tablet by mouth daily with supper.   CENTRUM SILVER 50+WOMEN Tabs Take 1 tablet by mouth daily with supper.   chlorpheniramine 4 MG tablet Commonly known as:  CHLOR-TRIMETON Take 8 mg by mouth at bedtime.   furosemide 40 MG tablet Commonly known as:  LASIX Take 40 mg by mouth daily.   isosorbide mononitrate 30 MG 24 hr tablet Commonly known as:  IMDUR Take 1 tablet (30 mg total) by mouth daily. Start taking on:  01/30/2018   losartan 50 MG tablet Commonly known as:  COZAAR Take 50 mg by mouth daily as needed (ONLY IF BLOOD PRESSURE IS GREATER THAN 110/70).   mesalamine 0.375 g 24 hr capsule Commonly known as:  APRISO Take 1,500 mg by mouth daily.   metoprolol tartrate 25 MG tablet Commonly known as:  LOPRESSOR Take 25 mg by mouth 2 (two) times daily as needed (for an elevated B/P).   nitroGLYCERIN 0.4 MG SL tablet Commonly known as:  NITROSTAT Place 1 tablet (0.4 mg total) under the tongue every 5 (five) minutes as needed for chest pain.   potassium chloride SA 20 MEQ tablet Commonly known as:  K-DUR,KLOR-CON Take 20 mEq by mouth daily with supper.   prednisoLONE acetate 1 % ophthalmic suspension Commonly known as:  PRED FORTE Place 1 drop into the left eye daily.   UNABLE TO FIND CPAP: At bedtime nightly and during all naps   XELJANZ 10 MG Tabs Generic drug:  Tofacitinib Citrate Take 10 mg by mouth 2 (two) times daily.       Acute coronary syndrome (MI, NSTEMI, STEMI, etc) this admission?:  No.  The elevated Troponin was due to the acute medical illness or demand ischemia.    Outstanding Labs/Studies   -Will need lipid panel and LFT's in 6 weeks  -CBC to  assess anemia  -Possible referral for LE duplex study for bilateral LE lymphedema   Duration of Discharge Encounter   Greater than 30 minutes including physician time.  Signed, Kathyrn Drown, NP 01/29/2018, 10:59 AM

## 2018-01-29 ENCOUNTER — Encounter: Payer: Self-pay | Admitting: Nurse Practitioner

## 2018-01-29 DIAGNOSIS — I890 Lymphedema, not elsewhere classified: Secondary | ICD-10-CM

## 2018-01-29 LAB — BASIC METABOLIC PANEL
ANION GAP: 6 (ref 5–15)
BUN: 15 mg/dL (ref 8–23)
CALCIUM: 8.5 mg/dL — AB (ref 8.9–10.3)
CO2: 27 mmol/L (ref 22–32)
Chloride: 110 mmol/L (ref 98–111)
Creatinine, Ser: 0.92 mg/dL (ref 0.44–1.00)
GFR calc non Af Amer: 59 mL/min — ABNORMAL LOW (ref >60)
GLUCOSE: 98 mg/dL (ref 70–99)
Potassium: 4 mmol/L (ref 3.5–5.1)
SODIUM: 143 mmol/L (ref 135–145)

## 2018-01-29 LAB — CBC
HEMATOCRIT: 28.8 % — AB (ref 36.0–46.0)
HEMOGLOBIN: 8.8 g/dL — AB (ref 12.0–15.0)
MCH: 28.9 pg (ref 26.0–34.0)
MCHC: 30.6 g/dL (ref 30.0–36.0)
MCV: 94.4 fL (ref 78.0–100.0)
Platelets: 265 10*3/uL (ref 150–400)
RBC: 3.05 MIL/uL — ABNORMAL LOW (ref 3.87–5.11)
RDW: 16.1 % — AB (ref 11.5–15.5)
WBC: 3.4 10*3/uL — AB (ref 4.0–10.5)

## 2018-01-29 MED ORDER — ATORVASTATIN CALCIUM 80 MG PO TABS: 80.0000 mg | ORAL_TABLET | Freq: Every day | ORAL | 4 refills | Status: DC

## 2018-01-29 MED ORDER — DIPHENHYDRAMINE HCL 25 MG PO CAPS
25.0000 mg | ORAL_CAPSULE | Freq: Four times a day (QID) | ORAL | Status: DC | PRN
  Administered 2018-01-29: 25 mg via ORAL
  Filled 2018-01-29: qty 1

## 2018-01-29 MED ORDER — ISOSORBIDE MONONITRATE ER 30 MG PO TB24: 30.0000 mg | ORAL_TABLET | Freq: Every day | ORAL | 4 refills | Status: DC

## 2018-01-29 NOTE — Progress Notes (Addendum)
Progress Note  Patient Name: Isabella [name] Date of Encounter: 01/29/2018  Primary Cardiologist: Sinclair Grooms, MD   Subjective   Some pruritis and rash on left arm. Improved with benedryl   Inpatient Medications    Scheduled Meds: . atorvastatin  80 mg Oral QHS  . Chlorhexidine Gluconate Cloth  6 each Topical Q0600  . furosemide  40 mg Oral Daily  . heparin injection (subcutaneous)  5,000 Units Subcutaneous Q8H  . Influenza vac split quadrivalent PF  0.5 mL Intramuscular Tomorrow-1000  . isosorbide mononitrate  30 mg Oral Daily  . losartan  50 mg Oral Daily  . mesalamine  2.4 g Oral Daily  . metoprolol tartrate  25 mg Oral Q8H  . mupirocin ointment  1 application Nasal BID  . potassium chloride SA  20 mEq Oral Q supper  . prednisoLONE acetate  1 drop Left Eye Daily  . sodium chloride flush  3 mL Intravenous Q12H  . Tofacitinib Citrate  10 mg Oral BID   Continuous Infusions: . sodium chloride     PRN Meds: sodium chloride, acetaminophen, acetaminophen, diphenhydrAMINE, nitroGLYCERIN, ondansetron (ZOFRAN) IV, sodium chloride flush   Vital Signs    Vitals:   01/29/18 0332 01/29/18 0333 01/29/18 0754 01/29/18 0918  BP: (!) 130/51 (!) 130/51 (!) 149/77   Pulse: (!) 59 63 60 62  Resp: 19  16   Temp: (!) 97.4 F (36.3 C)  98.2 F (36.8 C)   TempSrc: Oral  Oral   SpO2: 100%  100% 100%  Weight:      Height:        Intake/Output Summary (Last 24 hours) at 01/29/2018 0943 Last data filed at 01/29/2018 0900 Gross per 24 hour  Intake 729 ml  Output 1750 ml  Net -1021 ml   Filed Weights   01/27/18 0102 01/28/18 0352  Weight: 95.3 kg 94.7 kg    Physical Exam   General: Obese,  NAD Skin: mild rash left forearm and Mallor erythema Bruising over left radial cath sight  Head: Normocephalic, atraumatic, clear, moist mucus membranes. Neck: Negative for carotid bruits. No JVD Lungs:Clear to ausculation bilaterally. No wheezes, rales, or rhonchi. Breathing is  unlabored. Cardiovascular: RRR with S1 S2. No murmurs, rubs, gallops, or LV heave appreciated. Abdomen: Soft, non-tender, non-distended with normoactive bowel sounds. No obvious abdominal masses. MSK: Strength and tone appear normal for age. 5/5 in all extremities Extremities: 2+ BLE edema. No clubbing or cyanosis. DP/PT pulses 2+ bilaterally Neuro: Alert and oriented. No focal deficits. No facial asymmetry. MAE spontaneously. Psych: Responds to questions appropriately with normal affect.   Plus 2 lymph edema LEls  Labs    Chemistry Recent Labs  Lab 01/27/18 0103 01/27/18 0151 01/27/18 0806 01/29/18 0237  NA 142 141 143 143  K 3.3* 3.4* 4.1 4.0  CL 105 107 109 110  CO2 23  --  26 27  GLUCOSE 111* 103* 91 98  BUN 16 20 13 15   CREATININE 0.96 0.90 0.92 0.92  CALCIUM 9.1  --  8.6* 8.5*  GFRNONAA 56*  --  59* 59*  GFRAA >60  --  >60 >60  ANIONGAP 14  --  8 6     Hematology Recent Labs  Lab 01/27/18 0806 01/28/18 0343 01/29/18 0237  WBC 4.3 4.2 3.4*  RBC 3.29* 3.12* 3.05*  HGB 9.3* 8.7* 8.8*  HCT 31.3* 29.7* 28.8*  MCV 95.1 95.2 94.4  MCH 28.3 27.9 28.9  MCHC 29.7* 29.3*  30.6  RDW 16.1* 16.2* 16.1*  PLT 285 283 265    Cardiac Enzymes Recent Labs  Lab 01/27/18 0447 01/27/18 0806 01/27/18 1730  TROPONINI 21.44* 22.15* 12.98*    Recent Labs  Lab 01/27/18 0130  TROPIPOC 1.21*     BNPNo results for input(s): BNP, PROBNP in the last 168 hours.   DDimer No results for input(s): DDIMER in the last 168 hours.   Radiology    No results found. Telemetry    01/28/18 NSR/SB - Personally Reviewed  ECG    No new tracings as of 01/28/18 - Personally Reviewed  Cardiac Studies   Echocardiogram 01/27/18: Study Conclusions  - Left ventricle: The cavity size was normal. Systolic function was   normal. The estimated ejection fraction was in the range of 60%   to 65%. Wall motion was normal; there were no regional wall   motion abnormalities. Doppler  parameters are consistent with   abnormal left ventricular relaxation (grade 1 diastolic   dysfunction). Doppler parameters are consistent with   indeterminate ventricular filling pressure. - Aortic valve: Transvalvular velocity was within the normal range.   There was no stenosis. There was no regurgitation. - Mitral valve: Transvalvular velocity was within the normal range.   There was no evidence for stenosis. There was mild regurgitation. - Left atrium: The atrium was moderately dilated. - Right ventricle: The cavity size was normal. Wall thickness was   normal. Systolic function was normal. - Tricuspid valve: There was mild regurgitation. - Pulmonary arteries: Systolic pressure was within the normal   range. PA peak pressure: 24 mm Hg (S).  Cardiac catheterization 01/27/18:  Normal left main.  Large widely patent LAD without obstruction.  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  TIMI grade III flow noted.  Large dominant right coronary without obstructive disease. 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  Widely patent ostial RCA stent which was placed due to catheter induced coronary dissection 2012.  Overall normal LV function with EF 55%.  Mildly elevated LVEDP.  RECOMMENDATIONS:   Discontinue IV heparin.  High intensity statin therapy.  Continue low-dose long-acting nitrates.  Aspirin.  No indication for dual antiplatelet therapy.  Progression based on clinical course.  Significant bilateral lower extremity edema of uncertain etiology.  Consider bilateral lower extremity Doppler studies with mapping to exclude postphlebitic syndrome.  Recommend Aspirin 50m daily for moderate CAD.  Patient Profile     77y.o. female with history of CAD s/p MI with DESx2 to the RCA 2012,  ulcerative colitis, HTN, diastolic CHF and sarcoidosis was seen in the ER for chest pain and was found to have NSTEMI.   Assessment & Plan    1. [facility]I: -[facility] found to have elevated trop on presentation  -Cardiac catheterization on 01/27/18 with spontaneous coronary dissection of the first obtuse marginal branch, starting in the proximal vessel and extending to small branches distally>>>likely the culprit of her pain. There was patent LM, LAD and RCA with patent stent from SCAD in 2012>>estimated LVEF 55% -Trop, 21.44>22.15>12.98>>trending down>>no anginal symptoms  -Left radial cath site with moderate bruising and swelling   -Hb, 8.7 today>>monitor -Continue ASA daily for moderate CAD, metoprolol, losartan, high intensity statin  -Imdur 320mdaily started post-cath   2. Anemia: -Hct 28.8 stable  -  No s/s of acute bleeding  3. 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 HTN: -Well controlled.  Continue current medications and low sodium Dash type diet.    5. Ulcerative colitis: -Stable, no exacerbation   6. Chronic diastolic CHF: -improved her LE edema is not diastolic issue d/c with lasix 20 mg consider PT referral for lymphedema   7. Rash:  May be from CHG wipes used for MRSA not really on any new medication that could cause and seems more like contact dermatitis  D/C home today f/u Dr Burman Riis

## 2018-01-29 NOTE — Care Management Important Message (Signed)
Important Message  Patient Details  Name: Isabella [name] MRN: [id number] Date of Birth: April 20, 1941   Medicare Important Message Given:  Yes    Maryclare Labrador, RN 01/29/2018, 11:22 AM

## 2018-01-29 NOTE — Evaluation (Signed)
Physical Therapy Evaluation/Discharge  Patient Details Name: Isabella [name] MRN: [id number] DOB: May 12, 1941 Today's Date: 01/29/2018   History of Present Illness  77 yo admitted with NSTEMI s/p radial cath 9/17. PMhx: CAD, CHF, colitis, HTN, sarcoidosis, LE edema, back sx  Clinical Impression  Pt pleasant on arrival, eager to return home and go shopping at [place]. Pt modified independent with all transfers and ambulation using RW. Pt with some decrease in activity tolerance, but pt reports distance walked is similar to baseline. Pt denies SOB or dizziness throughout session. BP prior to session in supine 149/77 (98), SpO2 98%. Pt vitals in seated after session: BP 152/72 (97), RR 23, SpO2 on RA 100%, HR 64. All pt education completed, pt does not require further physical therapy at this time.      Follow Up Recommendations No PT follow up    Equipment Recommendations  None recommended by PT    Recommendations for Other Services       Precautions / Restrictions Precautions Precautions: None      Mobility  Bed Mobility Overal bed mobility: Modified Independent             General bed mobility comments: HOB 40 degrees, use of rail no physical assist  Transfers Overall transfer level: Modified independent Equipment used: Rolling walker (2 wheeled)             General transfer comment: three sit to stand trials from bed and 3-in-1,   Ambulation/Gait Ambulation/Gait assistance: Modified independent (Device/Increase time) Gait Distance (Feet): 150 Feet Assistive device: Rolling walker (2 wheeled) Gait Pattern/deviations: Step-through pattern;Decreased stride length;Trunk flexed Gait velocity: decreased  Gait velocity interpretation: >2.62 ft/sec, indicative of community ambulatory General Gait Details: increased time, overall steady, slightly flexed trunk. VSS   Stairs            Wheelchair Mobility    Modified Rankin (Stroke Patients Only)       Balance  Overall balance assessment: Modified Independent(can stand without AD, uses walker or rollator for ambulation. )                                           Pertinent Vitals/Pain Pain Assessment: No/denies pain    Home Living Family/patient expects to be discharged to:: Private residence Living Arrangements: Alone Available Help at Discharge: Family;Available PRN/intermittently Type of Home: House Home Access: Ramped entrance     Home Layout: One level Home Equipment: Walker - 2 wheels;Cane - single point;Bedside commode;Shower seat - built in;Grab bars - tub/shower;Walker - 4 wheels      Prior Function Level of Independence: Independent with assistive device(s)         Comments: rollator outside, cane inside     Hand Dominance        Extremity/Trunk Assessment   Upper Extremity Assessment Upper Extremity Assessment: Overall WFL for tasks assessed    Lower Extremity Assessment Lower Extremity Assessment: Overall WFL for tasks assessed    Cervical / Trunk Assessment Cervical / Trunk Assessment: Kyphotic  Communication   Communication: No difficulties  Cognition Arousal/Alertness: Awake/alert Behavior During Therapy: WFL for tasks assessed/performed Overall Cognitive Status: Within Functional Limits for tasks assessed  General Comments General comments (skin integrity, edema, etc.): bil LEs edema (pt reports better in AM and looked better than normal today), itchy rash on arms, benedryl received prior to session. Wound on RLE shin, had previous cellulitis bout in June 2019.     Exercises     Assessment/Plan    PT Assessment Patent does not need any further PT services  PT Problem List Decreased activity tolerance;Decreased balance       PT Treatment Interventions      PT Goals (Current goals can be found in the Care Plan section)  Acute Rehab PT Goals Patient Stated Goal: return  home  PT Goal Formulation: With patient Time For Goal Achievement: 02/12/18 Potential to Achieve Goals: Good    Frequency     Barriers to discharge        Co-evaluation               AM-PAC PT "6 Clicks" Daily Activity  Outcome Measure Difficulty turning over in bed (including adjusting bedclothes, sheets and blankets)?: None Difficulty moving from lying on back to sitting on the side of the bed? : A Little Difficulty sitting down on and standing up from a chair with arms (e.g., wheelchair, bedside commode, etc,.)?: None Help needed moving to and from a bed to chair (including a wheelchair)?: None Help needed walking in hospital room?: None Help needed climbing 3-5 steps with a railing? : A Little 6 Click Score: 22    End of Session Equipment Utilized During Treatment: Gait belt Activity Tolerance: Patient tolerated treatment well Patient left: in chair;with call bell/phone within reach Nurse Communication: Mobility status PT Visit Diagnosis: Other abnormalities of gait and mobility (R26.89)    Time: 5041-3643 PT Time Calculation (min) (ACUTE ONLY): 29 min   Charges:   PT Evaluation $PT Eval Moderate Complexity: [place], [place]  Acute Rehab [phone]   Samuella Bruin 01/29/2018, 9:56 AM

## 2018-02-06 ENCOUNTER — Telehealth: Payer: Self-pay

## 2018-02-06 NOTE — Telephone Encounter (Signed)
Patient daughter called in asking if [employer] FMLA has been received. I made her aware I do not see anything. I also ran her through the process. She is aware of $25.00 payment that needs to be made, I emailed her a release for consent, and gave her my fax number to see if [employer] can fax FMLA here. She will call back next week and see if we have received.

## 2018-02-10 ENCOUNTER — Ambulatory Visit: Payer: Medicare Other | Admitting: Nurse Practitioner

## 2018-02-10 ENCOUNTER — Encounter: Payer: Self-pay | Admitting: Nurse Practitioner

## 2018-02-10 VITALS — BP 150/100 | HR 71 | Ht 61.0 in | Wt 202.4 lb

## 2018-02-10 DIAGNOSIS — E782 Mixed hyperlipidemia: Secondary | ICD-10-CM | POA: Diagnosis not present

## 2018-02-10 DIAGNOSIS — I10 Essential (primary) hypertension: Secondary | ICD-10-CM | POA: Diagnosis not present

## 2018-02-10 DIAGNOSIS — I5032 Chronic diastolic (congestive) heart failure: Secondary | ICD-10-CM

## 2018-02-10 DIAGNOSIS — I259 Chronic ischemic heart disease, unspecified: Secondary | ICD-10-CM

## 2018-02-10 DIAGNOSIS — I87009 Postthrombotic syndrome without complications of unspecified extremity: Secondary | ICD-10-CM

## 2018-02-10 NOTE — Patient Instructions (Signed)
We will be checking the following labs today - NONE   Medication Instructions:    Continue with your current medicines.     Testing/Procedures To Be Arranged:  N/A  Follow-Up:   See Dr. [name] as planned in December  I have placed a referral to see Dr. Oneida Alar regarding your legs.     Other Special Instructions:   N/A    If you need a refill on your cardiac medications before your next appointment, please call your pharmacy.   Call the [place] office at [phone] if you have any questions, problems or concerns.

## 2018-02-10 NOTE — Progress Notes (Signed)
CARDIOLOGY OFFICE NOTE  Date:  02/10/2018    Isabella [name] Date of Birth: [birth year] Medical Record #[id number]  PCP:  [name], MD  Cardiologist:  [name]   Chief Complaint  Patient presents with  . Coronary Artery Disease    Post hospital visit - seen for Dr. Bonnell Public    History of Present Illness: Isabella [name] is a 77 y.o. female who presents today for a post hospital visit. Seen for Dr. [name].   She has a history of CAD s/p STEMI in 2012 treated with DES to the RCA x2 proximal for catheter induced dissection distal to treat acute lesion, ulcerative colitis, HTN, HLD, chronic diastolic CHF, chronic LE swelling and sarcoidosis. In 2012, she had an MI with DES x 2 to the RCA (proximal for catheter-induced dissection and distal to treat acute lesion). She has been treated for UC with prednisone, now off. She has had lung nodules with biopsy-proven sarcoidosis. Also with history of RLE DVT, not currently anticoagulated. Echo in 6/19 showed EF 60-65%.  She was last seen here back in July.   She presented to [facility] on 01/27/18 for chest pain and was found to have NSTEMI. In the ED, troponin level was noted to be elevated at 21.44>22.15>12.98. EKG without acute changes, similar to prior tracings. She was given SL NTG on presentation without relief, however her pain was relieved with fentanyl. CTA completed to r/o aortic dissection which was found to be negative. She was admitted and cardiac cath was arranged. On 01/27/18, she was taken to the cardiac cath lab which showed normal left main, large widely patent LAD without obstruction, spontaneous coronary dissection of the first obtuse marginal branch, starting in the proximal vessel and extending to small branches distally, large dominant right coronary without obstructive disease. Per cath note, the continuation of the right coronary which was the site of SCAD in 2012 has completely healed and has normal appearance. She had widely patent  ostial RCA stent which was placed due to catheter induced coronary dissection 2012. Overall normal LV function with EF 55% and mildly elevated LVEDP.  Recommendations were made to start high intensity statin, add long acting nitrates, ASA for moderate CAD and consideration of lower extremity doppler studies with mapping to exclude postphlebitic syndrome.  Previous studies with superficial phlebitis and no evidence of DVT. Consider referral for PT for chronic lymphedema.   Comes in today. Here alone. Notes this presentation with her heart this time was very different from her prior MI in 2012. She says she is doing good. Not short of breath. Did not have any chest pain this time.  Feels a "little lazier". BP much better at home - but she does use a wrist cuff. Her cuff has been checked earlier this summer and correlated ok. Her swelling is chronic - but is still unclear as to the etiology. She says this is currently typical for her - support stockings actually make it worse. She is on diuretic.  Does improve some overnight but for the most part stays unchanged. Her swelling extends up past her thighs. Her CT did show a new 19m lobulated nodule in the right lower lobe - she does follow with Dr. HRoxan Hockeyfor her lung nodules. There was an area of fat necrosis involving the left renal - ? Significance. She had labs with her PCP yesterday. She has had some bruising. Overall, she feels like she is doing ok and feels "blessed".  Past Medical History:  Diagnosis Date  . Arthritis   . Complication of anesthesia    Took a while to wake up with knee  . Coronary artery disease   . Dyspnea    with walking at times  . Fallen bladder   . GERD (gastroesophageal reflux disease)   . 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 Headache    migraines in her younger years  . Hemorrhoids   . High cholesterol   . History of blood transfusion   .  Hx of adenomatous colonic polyps   . Hypertension   . Lung nodule   . Lymphedema    BLE  . Myocardial infarct ([place]) 03/24/2011   Dr. [name]  . Nocturnal leg cramps   . Obesity   . Sarcoidosis   . Sleep apnea    uses cpap    Past Surgical History:  Procedure Laterality Date  . ABDOMINAL HYSTERECTOMY  1990  . APPENDECTOMY  1970  . BACK SURGERY  2015   lower back  . CARDIAC CATHETERIZATION  03/2011  . CARPAL TUNNEL RELEASE Bilateral 2004  . CATARACT EXTRACTION    . CHOLECYSTECTOMY  1970  . COLONOSCOPY W/ BIOPSIES AND POLYPECTOMY    . CORONARY ANGIOPLASTY  2012  . CORONARY STENT PLACEMENT  03/24/2011   Dr. [name]  . EYE SURGERY  2008,2011   corneal transplant  . FEMORAL ARTERY EXPLORATION  03/25/2011   Procedure: FEMORAL ARTERY EXPLORATION;  Surgeon: Elam Dutch, MD;  Location: [facility];  Service: Vascular;  Laterality: Right;  Evacuation of Hematoma   . hematoma surgery     from cardiac cath -hematoma in groin-right  . HERNIA REPAIR  1607   umbilical  . JOINT REPLACEMENT    . KNEE ARTHROSCOPY  2006   Right  . [facility] Left Eye  2012  . LEFT HEART CATH AND CORONARY ANGIOGRAPHY N/A 01/27/2018   Procedure: LEFT HEART CATH AND CORONARY ANGIOGRAPHY;  Surgeon: Belva Crome, MD;  Location: [facility];  Service: Cardiovascular;  Laterality: N/A;  . LEFT HEART CATHETERIZATION WITH CORONARY ANGIOGRAM N/A 03/24/2011   Procedure: LEFT HEART CATHETERIZATION WITH CORONARY ANGIOGRAM;  Surgeon: Sinclair Grooms, MD;  Location: [facility] CATH LAB;  Service: Cardiovascular;  Laterality: N/A;  . PERCUTANEOUS CORONARY STENT INTERVENTION (PCI-S) N/A 03/24/2011   Procedure: PERCUTANEOUS CORONARY STENT INTERVENTION (PCI-S);  Surgeon: Sinclair Grooms, MD;  Location: [facility] CATH LAB;  Service: Cardiovascular;  Laterality: N/A;  . SHOULDER SURGERY  2010  . TONSILLECTOMY  1949  . TONSILLECTOMY AND ADENOIDECTOMY    . TOTAL KNEE ARTHROPLASTY  07/2010   Left  . TOTAL KNEE ARTHROPLASTY   06/17/2012   Procedure: TOTAL KNEE ARTHROPLASTY;  Surgeon: Gearlean Alf, MD;  Location: [facility];  Service: Orthopedics;  Laterality: Right;  . TUBAL LIGATION  1978  . [place], 2007   x 2  . VIDEO BRONCHOSCOPY WITH ENDOBRONCHIAL NAVIGATION  04/13/2012   Procedure: VIDEO BRONCHOSCOPY WITH ENDOBRONCHIAL NAVIGATION;  Surgeon: Melrose Nakayama, MD;  Location: [facility];  Service: Thoracic;  Laterality: N/A;  . VIDEO BRONCHOSCOPY WITH ENDOBRONCHIAL NAVIGATION N/A 04/08/2016   Procedure: VIDEO BRONCHOSCOPY WITH ENDOBRONCHIAL NAVIGATION;  Surgeon: Rigoberto Noel, MD;  Location: [facility];  Service: Thoracic;  Laterality: N/A;     Medications: Current Meds  Medication Sig  . acetaminophen (TYLENOL) 500 MG tablet Take 500 mg  by mouth every 6 (six) hours as needed for mild pain.  Marland Kitchen aspirin EC 81 MG tablet Take 81 mg by mouth daily at 3 pm.  . atorvastatin (LIPITOR) 80 MG tablet Take 1 tablet (80 mg total) by mouth daily.  . Calcium Carb-Cholecalciferol (CALCIUM + D3 PO) Take 1 tablet by mouth daily with supper.  . chlorpheniramine (CHLOR-TRIMETON) 4 MG tablet Take 8 mg by mouth at bedtime.   . furosemide (LASIX) 40 MG tablet Take 40 mg by mouth daily.  . isosorbide mononitrate (IMDUR) 30 MG 24 hr tablet Take 1 tablet (30 mg total) by mouth daily.  Marland Kitchen losartan (COZAAR) 50 MG tablet Take 50 mg by mouth daily as needed (ONLY IF BLOOD PRESSURE IS GREATER THAN 110/70).   . mesalamine (APRISO) 0.375 g 24 hr capsule Take 1,500 mg by mouth daily.  . metoprolol tartrate (LOPRESSOR) 25 MG tablet Take 25 mg by mouth 2 (two) times daily as needed (for an elevated B/P).   . Multiple Vitamins-Minerals (CENTRUM SILVER 50+WOMEN) TABS Take 1 tablet by mouth daily with supper.   . nitroGLYCERIN (NITROSTAT) 0.4 MG SL tablet Place 1 tablet (0.4 mg total) under the tongue every 5 (five) minutes as needed for chest pain.  . potassium chloride SA (K-DUR,KLOR-CON) 20 MEQ tablet Take 20 mEq by mouth daily with  supper.   . prednisoLONE acetate (PRED FORTE) 1 % ophthalmic suspension Place 1 drop into the left eye daily.  . Tofacitinib Citrate (XELJANZ) 10 MG TABS Take 10 mg by mouth 2 (two) times daily.  Marland Kitchen UNABLE TO FIND CPAP: At bedtime nightly and during all naps     Allergies: Allergies  Allergen Reactions  . Codeine Shortness Of Breath and Other (See Comments)    Made back hurt; does not affect liver per patient but has has caused difficulty breathing/tolerates Percocet only in small doses  . Imodium A-D [Loperamide Hcl] Shortness Of Breath and Other (See Comments)    Caused a pain & "squeezing" sensation in the lungs also  . Neomycin Swelling and Other (See Comments)    Reaction to eye ointment - eyes became swollen shut Makes wounds ooze  . Penicillinase Hives, Shortness Of Breath and Rash    Has patient had a PCN reaction causing immediate rash, facial/tongue/throat swelling, SOB or lightheadedness with hypotension: Yes Has patient had a PCN reaction causing severe rash involving mucus membranes or skin necrosis: No Has patient had a PCN reaction that required hospitalization:No Has patient had a PCN reaction occurring within the last 10 years: No If all of the above answers are "NO", then may proceed with Cephalosporin use.   Marland Kitchen Penicillins Hives, Shortness Of Breath and Rash    Reaction @ 18-yrs old Has patient had a PCN reaction causing immediate rash, facial/tongue/throat swelling, SOB or lightheadedness with hypotension: Yes Has patient had a PCN reaction causing severe rash involving mucus membranes or skin necrosis: No Has patient had a PCN reaction that required hospitalization: No Has patient had a PCN reaction occurring within the last 10 years: No If all of the above answers are "NO", then may proceed with Cephalosporin use.  . Tramadol Shortness Of Breath and Other (See Comments)    (Per patient) "felt like lungs were being squeezed"   . Lisinopril Cough  . Hornet Venom  Swelling    Swelling at site of sting  . Adhesive [Tape] Rash  . Erythromycin Diarrhea  . Hydromorphone Other (See Comments)    Severe Lethargy   .  Oxycodone-Acetaminophen Other (See Comments)    Confusion   . Percocet [Oxycodone-Acetaminophen] Other (See Comments)    Confusion     Social History: The patient  reports that she has never smoked. She has never used smokeless tobacco. She reports that she does not drink alcohol or use drugs.   Family History: The patient's family history includes Breast cancer in her maternal grandmother; Cancer in her father; Hypertension in her mother.   Review of Systems: Please see the history of present illness.   Otherwise, the review of systems is positive for none.   All other systems are reviewed and negative.   Physical Exam: VS:  BP (!) 150/100 (BP Location: Left Arm, Patient Position: Sitting, Cuff Size: Normal)   Pulse 71   Ht 5' 1"  (1.549 m)   Wt 202 lb 6.4 oz (91.8 kg)   SpO2 99% Comment: at rest  BMI 38.24 kg/m  .  BMI Body mass index is 38.24 kg/m.  Wt Readings from Last 3 Encounters:  02/10/18 202 lb 6.4 oz (91.8 kg)  01/28/18 208 lb 12.4 oz (94.7 kg)  12/10/17 208 lb (94.3 kg)    General: Pleasant. She is alert and in no acute distress.   HEENT: Normal.  Neck: Supple, no JVD, carotid bruits, or masses noted.  Cardiac: Regular rate and rhythm. Heart tones are distant. Extensive swelling in the legs.   Respiratory:  Lungs are clear to auscultation bilaterally with normal work of breathing.  GI: Soft and nontender. Large pannus  MS: No deformity or atrophy. Gait and ROM intact. Using a cane.  Skin: Warm and dry. Color is normal.  Neuro:  Strength and sensation are intact and no gross focal deficits noted.  Psych: Alert, appropriate and with normal affect.   LABORATORY DATA:  EKG:  EKG is not ordered today.  Lab Results  Component Value Date   WBC 3.4 (L) 01/29/2018   HGB 8.8 (L) 01/29/2018   HCT 28.8 (L)  01/29/2018   PLT 265 01/29/2018   GLUCOSE 98 01/29/2018   CHOL 113 01/27/2018   TRIG 35 01/27/2018   HDL 60 01/27/2018   LDLCALC 46 01/27/2018   ALT 27 10/26/2017   AST 34 10/26/2017   NA 143 01/29/2018   K 4.0 01/29/2018   CL 110 01/29/2018   CREATININE 0.92 01/29/2018   BUN 15 01/29/2018   CO2 27 01/29/2018   TSH 1.250 03/24/2011   INR 1.09 09/15/2017    BNP (last 3 results) No results for input(s): BNP in the last 8760 hours.  ProBNP (last 3 results) No results for input(s): PROBNP in the last 8760 hours.   Other Studies Reviewed Today:  Echocardiogram 01/27/18: Study Conclusions  - Left ventricle: The cavity size was normal. Systolic function was normal. The estimated ejection fraction was in the range of 60% to 65%. Wall motion was normal; there were no regional wall motion abnormalities. Doppler parameters are consistent with abnormal left ventricular relaxation (grade 1 diastolic dysfunction). Doppler parameters are consistent with indeterminate ventricular filling pressure. - Aortic valve: Transvalvular velocity was within the normal range. There was no stenosis. There was no regurgitation. - Mitral valve: Transvalvular velocity was within the normal range. There was no evidence for stenosis. There was mild regurgitation. - Left atrium: The atrium was moderately dilated. - Right ventricle: The cavity size was normal. Wall thickness was normal. Systolic function was normal. - Tricuspid valve: There was mild regurgitation. - Pulmonary arteries: Systolic pressure was within the normal  range. PA peak pressure: 24 mm Hg (S).  Cardiac catheterization 01/27/18:  Normal left main.  Large widely patent LAD without obstruction.  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 TIMI grade III flow noted.  Large dominant right coronary without  obstructive disease. 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 Widely patent ostial RCA stent which was placed due to catheter induced coronary dissection 2012.  Overall normal LV function with EF 55%. Mildly elevated LVEDP.  RECOMMENDATIONS:   Discontinue IV heparin.  High intensity statin therapy.  Continue low-dose long-acting nitrates.  Aspirin. No indication for dual antiplatelet therapy.  Progression based on clinical course.  Significant bilateral lower extremity edema of uncertain etiology. Consider bilateral lower extremity Doppler studies with mapping to exclude postphlebitic syndrome.  Recommend Aspirin 62m daily for moderate CAD.  Assessment/Plan:  1. SCAD - to be managed medically. Continues aspirin. Her symptoms have resolved.   2. Anemia - she had labs earlier today with PCP  3. Chronic edema - unclear etiology - Dr. STamala Julianwanted to consider mapping/rule out phlebitic syndrome - patient has seen Dr. FOneida Alarin the remote past - referral placed  4. HTN - her BP is much better at home. Her cuff has been checked and correlates - will follow.   5. Ulcerative colitis  6. Chronic diastolic HF - normal EF by recent echo. Her current degree of swelling has not been felt to be related to heart failure. She has had increasing doses of her diuretics in the past to no avail.   7. Lung nodule - she follows with Dr. HRoxan Hockey- new nodule noted on most recent CT.    Current medicines are reviewed with the patient today.  The patient does not have concerns regarding medicines other than what has been noted above.  The following changes have been made:  See above.  Labs/ tests ordered today include:    Orders Placed This Encounter  Procedures  . Ambulatory referral to Vascular Surgery     Disposition:   FU with Dr. STamala Julianas planned in December.   Patient is agreeable to this  plan and will call if any problems develop in the interim.   Signed:Truitt Merle NP  02/10/2018 3:51 PM  CMcCrory[address] StreetSChesterfieldGMosby [place]  [id number]: [phone]: [phone]

## 2018-02-11 ENCOUNTER — Other Ambulatory Visit: Payer: Self-pay

## 2018-02-11 DIAGNOSIS — I87009 Postthrombotic syndrome without complications of unspecified extremity: Secondary | ICD-10-CM

## 2018-02-20 ENCOUNTER — Other Ambulatory Visit: Payer: Self-pay | Admitting: Thoracic Surgery (Cardiothoracic Vascular Surgery)

## 2018-02-20 DIAGNOSIS — R911 Solitary pulmonary nodule: Secondary | ICD-10-CM

## 2018-03-04 ENCOUNTER — Ambulatory Visit (HOSPITAL_COMMUNITY)
Admission: RE | Admit: 2018-03-04 | Discharge: 2018-03-04 | Disposition: A | Discharge status: Home / Self Care | Payer: Medicare Other | Source: Ambulatory Visit | Attending: Vascular Surgery | Admitting: Vascular Surgery

## 2018-03-04 DIAGNOSIS — I87009 Postthrombotic syndrome without complications of unspecified extremity: Secondary | ICD-10-CM | POA: Diagnosis present

## 2018-03-05 ENCOUNTER — Other Ambulatory Visit: Payer: Self-pay

## 2018-03-05 ENCOUNTER — Encounter: Payer: Self-pay | Admitting: Vascular Surgery

## 2018-03-05 ENCOUNTER — Ambulatory Visit: Payer: Medicare Other | Admitting: Vascular Surgery

## 2018-03-05 VITALS — BP 163/91 | HR 65 | Temp 96.9°F | Resp 16 | Ht 61.0 in | Wt 200.0 lb

## 2018-03-05 DIAGNOSIS — I83813 Varicose veins of bilateral lower extremities with pain: Secondary | ICD-10-CM | POA: Diagnosis not present

## 2018-03-05 NOTE — Progress Notes (Addendum)
Referring Physician: Truitt Merle, NP  Patient name: Isabella [name] MRN: [id number] DOB: 07/30/1940 Sex: female  REASON FOR CONSULT: Chronic leg swelling  HPI: Isabella [name] is a 77 y.o. female, with 5 to 6-year history of chronic leg swelling.  She states the right leg swelling became worse after having a total release knee replacement several years ago.  She does not currently wear compression stockings.  He is not really bothered that much by her leg swelling.  She does not complain of pain.  She has "gotten used to it" .  He has not had prior skin breakdown or ulceration.  She denies prior DVT.  She did have evacuation of a hematoma from her right groin after cardiac catheterization in the past other medical problems include very artery disease with recent myocardial infarction September 2019, elevated cholesterol, hypertension, lung nodule currently followed by Dr [name].    Past Medical History:  Diagnosis Date  . Arthritis   . Complication of anesthesia    Took a while to wake up with knee  . Coronary artery disease   . Dyspnea    with walking at times  . Fallen bladder   . GERD (gastroesophageal reflux disease)   . 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)   . 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 Headache    migraines in her younger years  . Hemorrhoids   . High cholesterol   . History of blood transfusion   . Hx of adenomatous colonic polyps   . Hypertension   . Lung nodule   . Lymphedema    BLE  . Myocardial infarct ([place]) 03/24/2011   Dr. [name]  . Nocturnal leg cramps   . Obesity   . Sarcoidosis   . Sleep apnea    uses cpap   Past Surgical History:  Procedure Laterality Date  . ABDOMINAL HYSTERECTOMY  1990  . APPENDECTOMY  1970  . BACK SURGERY  2015   lower back  . CARDIAC CATHETERIZATION  03/2011  . CARPAL TUNNEL RELEASE Bilateral 2004  . CATARACT EXTRACTION    . CHOLECYSTECTOMY  1970  . COLONOSCOPY W/ BIOPSIES AND  POLYPECTOMY    . CORONARY ANGIOPLASTY  2012  . CORONARY STENT PLACEMENT  03/24/2011   Dr. [name]  . EYE SURGERY  2008,2011   corneal transplant  . FEMORAL ARTERY EXPLORATION  03/25/2011   Procedure: FEMORAL ARTERY EXPLORATION;  Surgeon: Elam Dutch, MD;  Location: [facility];  Service: Vascular;  Laterality: Right;  Evacuation of Hematoma   . hematoma surgery     from cardiac cath -hematoma in groin-right  . HERNIA REPAIR  2878   umbilical  . JOINT REPLACEMENT    . KNEE ARTHROSCOPY  2006   Right  . [place] Surgery Left Eye  2012  . LEFT HEART CATH AND CORONARY ANGIOGRAPHY N/A 01/27/2018   Procedure: LEFT HEART CATH AND CORONARY ANGIOGRAPHY;  Surgeon: Belva Crome, MD;  Location: [facility];  Service: Cardiovascular;  Laterality: N/A;  . LEFT HEART CATHETERIZATION WITH CORONARY ANGIOGRAM N/A 03/24/2011   Procedure: LEFT HEART CATHETERIZATION WITH CORONARY ANGIOGRAM;  Surgeon: Sinclair Grooms, MD;  Location: [facility] C[facility] LAB;  Service: Cardiovascular;  Laterality: N/A;  . PERCUTANEOUS CORONARY STENT INTERVENTION (PCI-S) N/A 03/24/2011   Procedure: PERCUTANEOUS CORONARY STENT INTERVENTION (PCI-S);  Surgeon: Sinclair Grooms, MD;  Location: [facility] CATH LAB;  Service: Cardiovascular;  Laterality: N/A;  .  SHOULDER SURGERY  2010  . TONSILLECTOMY  1949  . TONSILLECTOMY AND ADENOIDECTOMY    . TOTAL KNEE ARTHROPLASTY  07/2010   Left  . TOTAL KNEE ARTHROPLASTY  06/17/2012   Procedure: TOTAL KNEE ARTHROPLASTY;  Surgeon: Gearlean Alf, MD;  Location: [facility];  Service: Orthopedics;  Laterality: Right;  . TUBAL LIGATION  1978  . [place], 2007   x 2  . VIDEO BRONCHOSCOPY WITH ENDOBRONCHIAL NAVIGATION  04/13/2012   Procedure: VIDEO BRONCHOSCOPY WITH ENDOBRONCHIAL NAVIGATION;  Surgeon: Melrose Nakayama, MD;  Location: [place];  Service: Thoracic;  Laterality: N/A;  . VIDEO BRONCHOSCOPY WITH ENDOBRONCHIAL NAVIGATION N/A 04/08/2016   Procedure: VIDEO BRONCHOSCOPY WITH  ENDOBRONCHIAL NAVIGATION;  Surgeon: Rigoberto Noel, MD;  Location: [facility];  Service: Thoracic;  Laterality: N/A;    Family History  Problem Relation Age of Onset  . Hypertension Mother   . Cancer Father   . Breast cancer Maternal Grandmother     SOCIAL HISTORY: Social History   Socioeconomic History  . Marital status: Widowed    Spouse name: Not on file  . Number of children: Not on file  . Years of education: Not on file  . Highest education level: Not on file  Occupational History  . Occupation: Retired    Fish farm manager: RETIRED  Social Needs  . Financial resource strain: Not on file  . Food insecurity:    Worry: Not on file    Inability: Not on file  . Transportation needs:    Medical: Not on file    Non-medical: Not on file  Tobacco Use  . Smoking status: Never Smoker  . Smokeless tobacco: Never Used  Substance and Sexual Activity  . Alcohol use: No  . Drug use: No  . Sexual activity: Not on file  Lifestyle  . Physical activity:    Days per week: Not on file    Minutes per session: Not on file  . Stress: Not on file  Relationships  . Social connections:    Talks on phone: Not on file    Gets together: Not on file    Attends religious service: Not on file    Active member of club or organization: Not on file    Attends meetings of clubs or organizations: Not on file    Relationship status: Not on file  . Intimate partner violence:    Fear of current or ex partner: Not on file    Emotionally abused: Not on file    Physically abused: Not on file    Forced sexual activity: Not on file  Other Topics Concern  . Not on file  Social History Narrative   Tobacco use cigarettes: never smoked, tobacco history last updated 12/23/2013. No smoking. No alcohol. Caffeine: yes, coffee, 3 servings daily.recreational drug use: never. No diet. Exercise: walks everyday. Occupation: retired. Marital status: married. Children: boys, 2 girls, 2. Seat belt use: yes.    Allergies    Allergen Reactions  . Codeine Shortness Of Breath and Other (See Comments)    Made back hurt; does not affect liver per patient but has has caused difficulty breathing/tolerates Percocet only in small doses Back hurts does not affect liver per patient but has has caused difficulty breathing but tolerates Percocet only at small doses  . Imodium A-D [Loperamide Hcl] Shortness Of Breath and Other (See Comments)    Caused a pain & "squeezing" sensation in the lungs also  . Neomycin Swelling and Other (  See Comments)    Reaction to eye ointment - eyes became swollen shut Makes wounds ooze Reaction to eye ointment - eyes swelled shut Makes wounds ooze OTHER-eye drops-swelling of eye, redness  . Penicillinase Hives, Shortness Of Breath and Rash    Has patient had a PCN reaction causing immediate rash, facial/tongue/throat swelling, SOB or lightheadedness with hypotension: Yes Has patient had a PCN reaction causing severe rash involving mucus membranes or skin necrosis: No Has patient had a PCN reaction that required hospitalization:No Has patient had a PCN reaction occurring within the last 10 years: No If all of the above answers are "NO", then may proceed with Cephalosporin use.   Marland Kitchen Penicillins Hives, Shortness Of Breath and Rash    Reaction @ 18-yrs old Has patient had a PCN reaction causing immediate rash, facial/tongue/throat swelling, SOB or lightheadedness with hypotension: Yes Has patient had a PCN reaction causing severe rash involving mucus membranes or skin necrosis: No Has patient had a PCN reaction that required hospitalization: No Has patient had a PCN reaction occurring within the last 10 years: No If all of the above answers are "NO", then may proceed with Cephalosporin use.  . Tramadol Shortness Of Breath and Other (See Comments)    (Per patient) "felt like lungs were being squeezed"   . Lisinopril Cough  . [place] Venom Swelling    Swelling at site of sting  . Adhesive  [Tape] Rash  . Erythromycin Diarrhea  . Hydromorphone Other (See Comments)    Severe Lethargy  Severe Lethargy   . Oxycodone-Acetaminophen Other (See Comments)    Confusion   . Percocet [Oxycodone-Acetaminophen] Other (See Comments)    Confusion     Current Outpatient Medications  Medication Sig Dispense Refill  . acetaminophen (TYLENOL) 500 MG tablet Take 500 mg by mouth every 6 (six) hours as needed for mild pain.    Marland Kitchen aspirin EC 81 MG tablet Take 81 mg by mouth daily at 3 pm.    . atorvastatin (LIPITOR) 80 MG tablet Take 1 tablet (80 mg total) by mouth daily. 90 tablet 4  . Calcium Carb-Cholecalciferol (CALCIUM + D3 PO) Take 1 tablet by mouth daily with supper.    . chlorpheniramine (CHLOR-TRIMETON) 4 MG tablet Take 8 mg by mouth at bedtime.     . furosemide (LASIX) 40 MG tablet Take 40 mg by mouth daily.    . isosorbide mononitrate (IMDUR) 30 MG 24 hr tablet Take 1 tablet (30 mg total) by mouth daily. 90 tablet 4  . losartan (COZAAR) 50 MG tablet Take 50 mg by mouth daily as needed (ONLY IF BLOOD PRESSURE IS GREATER THAN 110/70).     . mesalamine (APRISO) 0.375 g 24 hr capsule Take 1,500 mg by mouth daily.    . metoprolol tartrate (LOPRESSOR) 25 MG tablet Take 25 mg by mouth 2 (two) times daily as needed (for an elevated B/P).     . Multiple Vitamins-Minerals (CENTRUM SILVER 50+WOMEN) TABS Take 1 tablet by mouth daily with supper.     . nitroGLYCERIN (NITROSTAT) 0.4 MG SL tablet Place 1 tablet (0.4 mg total) under the tongue every 5 (five) minutes as needed for chest pain. 25 tablet 5  . potassium chloride SA (K-DUR,KLOR-CON) 20 MEQ tablet Take 20 mEq by mouth daily with supper.     . prednisoLONE acetate (PRED FORTE) 1 % ophthalmic suspension Place 1 drop into the left eye daily.    . Tofacitinib Citrate (XELJANZ) 10 MG TABS Take 10 mg by  mouth 2 (two) times daily.    Marland Kitchen UNABLE TO FIND CPAP: At bedtime nightly and during all naps     No current facility-administered medications  for this visit.     ROS:   General:  No weight loss, Fever, chills  HEENT: No recent headaches, no nasal bleeding, no visual changes, no sore throat  Neurologic: No dizziness, blackouts, seizures. No recent symptoms of stroke or mini- stroke. No recent episodes of slurred speech, or temporary blindness.  Cardiac: + recent episodes of chest pain/pressure, no shortness of breath at rest.  No shortness of breath with exertion.  Denies history of atrial fibrillation or irregular heartbeat  Vascular: No history of rest pain in feet.  No history of claudication.  No history of non-healing ulcer, No history of DVT   Pulmonary: No home oxygen, no productive cough, no hemoptysis,  No asthma or wheezing  Musculoskeletal:  [ ]  Arthritis, [ ]  Low back pain,  [ ]  Joint pain  Hematologic:No history of hypercoagulable state.  No history of easy bleeding.  No history of anemia  Gastrointestinal: No hematochezia or melena,  No gastroesophageal reflux, no trouble swallowing  Urinary: [ ]  chronic Kidney disease, [ ]  on HD - [ ]  MWF or [ ]  TTHS, [ ]  Burning with urination, [ ]  Frequent urination, [ ]  Difficulty urinating;   Skin: No rashes  Psychological: No history of anxiety,  No history of depression   Physical Examination  Vitals:   03/05/18 1300  BP: (!) 163/91  Pulse: 65  Resp: 16  Temp: (!) 96.9 F (36.1 C)  TempSrc: Oral  SpO2: 99%  Weight: 200 lb (90.7 kg)  Height: 5' 1"  (1.549 m)    Body mass index is 37.79 kg/m.  General:  Alert and oriented, no acute distress HEENT: Normal Neck: No bruit or JVD Pulmonary: Clear to auscultation bilaterally Cardiac: Regular Rate and Rhythm without murmur Abdomen: Soft, non-tender, non-distended, no mass, obese Skin: No rash or ulcer Extremity Pulses:  2+ radial, brachial, femoral, absent pulses bilaterally Musculoskeletal: No deformity circumferential edema partially pitting knee to foot bilaterally  Neurologic: Upper and lower  extremity motor 5/5 and symmetric  DATA:  Patient had a venous reflux exam today.  This showed 4 mm greater saphenous vein bilaterally with reflux in the greater saphenous vein bilaterally no evidence of DVT  ASSESSMENT: Patient with bilateral lower extremity venous reflux in the superficial system.  However, I do not believe that this explains all of her leg swelling symptoms.  It is probably multifactorial with dependent edema as well as a component possibly of lymphedema especially in the previous leg with knee replacement and prior right groin exploration as this is been going on for several years.   PLAN: Patient was given a prescription today for bilateral long-leg compression stockings.  We will see if this gives her symptomatic relief.  In light of her history of coronary artery disease especially recent myocardial infarction I would not consider a venous intervention at this point.  If her swelling persists and she does not get symptomatic relief from her compression stockings alone we could consider repeating her duplex at some point the future to see if she might be a candidate for laser ablation however her vein is not significantly dilated at this point she could potentially need coronary bypass grafting at some point in the future this vein is probably usable.   Ruta Hinds, MD [facility] Office: [phone] Pager: [phone]

## 2018-03-31 ENCOUNTER — Other Ambulatory Visit: Payer: Self-pay

## 2018-03-31 ENCOUNTER — Other Ambulatory Visit: Payer: Self-pay | Admitting: *Deleted

## 2018-03-31 ENCOUNTER — Ambulatory Visit
Admission: RE | Admit: 2018-03-31 | Discharge: 2018-03-31 | Disposition: A | Discharge status: Home / Self Care | Payer: Medicare Other | Source: Ambulatory Visit | Attending: Thoracic Surgery (Cardiothoracic Vascular Surgery) | Admitting: Thoracic Surgery (Cardiothoracic Vascular Surgery)

## 2018-03-31 ENCOUNTER — Encounter: Payer: Self-pay | Admitting: Thoracic Surgery (Cardiothoracic Vascular Surgery)

## 2018-03-31 ENCOUNTER — Other Ambulatory Visit: Payer: Self-pay | Admitting: Thoracic Surgery (Cardiothoracic Vascular Surgery)

## 2018-03-31 ENCOUNTER — Ambulatory Visit: Payer: Medicare Other | Admitting: Thoracic Surgery (Cardiothoracic Vascular Surgery)

## 2018-03-31 VITALS — BP 160/88 | HR 65 | Resp 18 | Ht 61.0 in | Wt 202.6 lb

## 2018-03-31 DIAGNOSIS — R911 Solitary pulmonary nodule: Secondary | ICD-10-CM | POA: Diagnosis not present

## 2018-03-31 DIAGNOSIS — R918 Other nonspecific abnormal finding of lung field: Secondary | ICD-10-CM

## 2018-03-31 NOTE — Progress Notes (Signed)
[address]       [place] [address]             [phone]     HPI: Isabella [name] returns for follow-up regarding pulmonary nodules  Isabella [name] is a 77 year old woman with a past history of sarcoidosis, ulcerative colitis, coronary artery disease, MI, hypertension, hyperlipidemia, obesity, sleep apnea, gastroesophageal reflux, and arthritis.  She was found to have a left lower lobe lung mass in 2013.  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  That mass resolved over time.  She has been followed with CT scans with multiple nodules that have waxed and waned over time.  She had a well-circumscribed nodule in the right lower lobe that slowly grew over time.  Back in May we did a CT-guided biopsy of that and no cancer cells were seen.  It was not definitive for sarcoidosis either although I suspect that was the etiology.  In the interim since her last visit, she has had a significant improvement in her ulcerative colitis.  She is off prednisone for the first time in many years.  In September she had a non-ST elevation MI due to coronary dissection.  She has been feeling well since she recovered from the MI.  She is not having any chest pain, pressure, tightness, shortness of breath, change in appetite, or weight loss.  Past Medical History:  Diagnosis Date  . Arthritis   . Complication of anesthesia    Took a while to wake up with knee  . Coronary artery disease   . Dyspnea    with walking at times  . Fallen bladder   . GERD (gastroesophageal reflux disease)   . 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 Headache    migraines in her younger years  . Hemorrhoids   . High cholesterol   . History of blood transfusion   . Hx of adenomatous colonic polyps   . Hypertension   . Lung nodule   . Lymphedema    BLE  . Myocardial infarct ([place]) 03/24/2011   Dr.  [name]  . Nocturnal leg cramps   . Obesity   . Sarcoidosis   . Sleep apnea    uses cpap    Current Outpatient Medications  Medication Sig Dispense Refill  . acetaminophen (TYLENOL) 500 MG tablet Take 500 mg by mouth every 6 (six) hours as needed for mild pain.    Marland Kitchen aspirin EC 81 MG tablet Take 81 mg by mouth daily at 3 pm.    . atorvastatin (LIPITOR) 80 MG tablet Take 1 tablet (80 mg total) by mouth daily. 90 tablet 4  . Calcium Carb-Cholecalciferol (CALCIUM + D3 PO) Take 1 tablet by mouth daily with supper.    . chlorpheniramine (CHLOR-TRIMETON) 4 MG tablet Take 8 mg by mouth at bedtime.     . furosemide (LASIX) 40 MG tablet Take 40 mg by mouth daily.    . isosorbide mononitrate (IMDUR) 30 MG 24 hr tablet Take 1 tablet (30 mg total) by mouth daily. 90 tablet 4  . losartan (COZAAR) 50 MG tablet Take 50 mg by mouth daily as needed (ONLY IF BLOOD PRESSURE IS GREATER THAN 110/70).     . mesalamine (APRISO) 0.375 g 24 hr capsule Take 1,500 mg by mouth daily.    . metoprolol tartrate (LOPRESSOR) 25 MG tablet Take 25 mg  by mouth 2 (two) times daily as needed (for an elevated B/P).     . Multiple Vitamins-Minerals (CENTRUM SILVER 50+WOMEN) TABS Take 1 tablet by mouth daily with supper.     . nitroGLYCERIN (NITROSTAT) 0.4 MG SL tablet Place 1 tablet (0.4 mg total) under the tongue every 5 (five) minutes as needed for chest pain. 25 tablet 5  . potassium chloride SA (K-DUR,KLOR-CON) 20 MEQ tablet Take 20 mEq by mouth daily with supper.     . prednisoLONE acetate (PRED FORTE) 1 % ophthalmic suspension Place 1 drop into the left eye daily.    . Tofacitinib Citrate (XELJANZ) 10 MG TABS Take 10 mg by mouth 2 (two) times daily.    Marland Kitchen UNABLE TO FIND CPAP: At bedtime nightly and during all naps     No current facility-administered medications for this visit.     Physical Exam BP (!) 160/88 (BP Location: Right Arm, Patient Position: Sitting, Cuff Size: Normal)   Pulse 65   Resp 18   Ht 5' 1"  (1.549 m)    Wt 202 lb 9.6 oz (91.9 kg)   SpO2 97% Comment: RA  BMI 38.28 kg/m  Obese 77 year old woman in no acute distress Alert and oriented x3 with no focal deficits No cervical or subclavicular adenopathy Cardiac regular rate and rhythm normal S1 and S2 Lungs clear bilaterally  Diagnostic Tests: CT CHEST WITHOUT CONTRAST  TECHNIQUE: Multidetector CT imaging of the chest was performed following the standard protocol without IV contrast.  COMPARISON:  01/27/2018 and 07/31/2017  FINDINGS: Cardiovascular: Heart is normal in size.  No pericardial effusion.  No evidence of thoracic aortic aneurysm. Very mild atherosclerotic calcifications of the aortic arch.  Coronary atherosclerosis of the right coronary artery.  Mediastinum/Nodes: No suspicious mediastinal lymphadenopathy.  Visualized thyroid is unremarkable.  Lungs/Pleura: 2.5 x 1.9 cm irregular/lobulated nodule in the anterior right lower lobe (series 8/image 37), previously 1.4 x 1.9 cm. Nodule abuts the right major fissure (series 8/image 35). Irregular/spiculated margins (series 8/image 78). While this technically could be related to sarcoidosis, the imaging appearance is considered suspicious for malignancy such as primary bronchogenic neoplasm given the progression from priors.  11 x 13 mm rounded nodule in the medial right lower lobe (series 8/image 102), previously 13 x 16 mm, corresponding to the prior benign biopsy.  5 x 18 mm irregular/flat nodule in the anterior left lower lobe (series 8/image 101), previously 8 x 19 mm.  Numerous additional small nodules bilaterally. Dominant nodules measures 6 mm in the posterior right upper lobe (series 8/image 32) and right lower lobe (series 8/image 93), unchanged.  No focal consolidation.  No pleural effusion or pneumothorax.  Upper Abdomen: Visualized upper abdomen is notable for hepatic cysts and vascular calcifications.  Musculoskeletal:  Degenerative changes of the visualized thoracolumbar spine.  IMPRESSION: 2.5 x 1.9 cm lobulated nodule in the anterior right lower lobe, progressed from priors and suspicious by imaging, despite the history of sarcoidosis. Consider PET-CT for further evaluation. (While PET may not be discriminatory between sarcoidosis and malignancy, it may be useful for initial evaluation to identify additional sites of metastatic disease and/or lymphadenopathy, prior to tissue sampling.)  Additional bilateral pulmonary nodules, chronic, likely benign given stability and possibly related to the patient's known sarcoidosis.  Aortic Atherosclerosis (ICD10-I70.0).   Electronically Signed   By: Julian Hy M.D.   On: 03/31/2018 12:43 I personally reviewed the CT images and concur with the findings noted above  Impression: Isabella [name] is a 77 year old  woman with a history of sarcoidosis, ulcerative colitis, coronary artery disease, spontaneous coronary artery dissection, MI, hypertension, hyperlipidemia, obesity, sleep apnea, gastroesophageal reflux, and arthritis.  She has had multiple lung nodules that have been followed over time.  Earlier this year she had a central right lower lobe nodule that had gradually enlarged.  Biopsies were not definitive, but did not show any evidence of malignancy.  That nodule actually has gotten smaller over time.  At the same time there is been a rapid increase in size of a peripheral right upper lobe nodule.  This was barely visible back in March.  In September there was a 14 mm nodule and now there is a 2.5 x 1.9cm nodule in that area.  The differential diagnosis includes new primary bronchogenic carcinoma, sarcoidosis, atypical infection, as well as possibility of other inflammatory etiologies.  I suspect that this is a sarcoid nodule that rapidly enlarging as she is off of prednisone for the first time in many years.  However I cannot rule out the possibility of  lung cancer.  We need a PET CT to further evaluate and make sure there are not any regional or distant sites suspicious for metastatic disease.  That will guide her initial diagnostic work-up of the right lung nodule.  Plan: PET/CT Return to the office in 3 weeks to discuss results.  Melrose Nakayama, MD [facility] [phone]

## 2018-04-10 ENCOUNTER — Ambulatory Visit (HOSPITAL_COMMUNITY)
Admission: RE | Admit: 2018-04-10 | Discharge: 2018-04-10 | Disposition: A | Discharge status: Home / Self Care | Payer: Medicare Other | Source: Ambulatory Visit | Attending: Thoracic Surgery (Cardiothoracic Vascular Surgery) | Admitting: Thoracic Surgery (Cardiothoracic Vascular Surgery)

## 2018-04-10 ENCOUNTER — Ambulatory Visit (HOSPITAL_COMMUNITY): Payer: Medicare Other

## 2018-04-10 ENCOUNTER — Encounter (HOSPITAL_COMMUNITY): Payer: Self-pay

## 2018-04-10 DIAGNOSIS — R918 Other nonspecific abnormal finding of lung field: Secondary | ICD-10-CM

## 2018-04-10 LAB — GLUCOSE, CAPILLARY: Glucose-Capillary: 90 mg/dL (ref 70–99)

## 2018-04-10 MED ORDER — FLUDEOXYGLUCOSE F - 18 (FDG) INJECTION: 10.1000 | Freq: Once | INTRAVENOUS | Status: DC | PRN

## 2018-04-12 NOTE — Progress Notes (Signed)
Cardiology Office Note:    Date:  04/13/2018   ID:  Isabella [name], Isabella [name] May 04, 1941, MRN [id number]  PCP:  [name], MD  Cardiologist:  Sinclair Grooms, MD   Referring MD: [name], MD   Chief Complaint  Patient presents with  . Coronary Artery Disease    SCAD    History of Present Illness:    Isabella [name] is a 77 y.o. 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  Isabella [name] seems to be doing okay now.  She has had no recurrence of chest discomfort and since her most recent episode of spontaneous coronary artery dissection which occurred in September (see diagram below).  She has been compliant with the medical regimen.  She has not needed to use sublingual nitroglycerin.  Imdur causes headache.  She is using 30 mg at nighttime.  She denies sublingual nitroglycerin use.  She has had no burning or dyspnea.  No palpitations or episodes of syncope.  She denies dyspnea.  No orthopnea PND.  Chronic right greater than left lower extremity swelling.  No recent falls but she has difficulty ambulating to her car because of arthritis and chronic right lower extremity edema.  She has not had furosemide yet today.  Past Medical History:  Diagnosis Date  . Arthritis   . Complication of anesthesia    Took a while to wake up with knee  . Coronary artery disease   . Dyspnea    with walking at times  . Fallen bladder   . GERD (gastroesophageal reflux disease)   . 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 Headache    migraines in her younger years  . Hemorrhoids   . High cholesterol   . History of blood transfusion   . Hx of adenomatous colonic polyps   . Hypertension   . Lung nodule   . Lymphedema      BLE  . Myocardial infarct ([place]) 03/24/2011   Dr. [name]  . Nocturnal leg cramps   . Obesity   . Sarcoidosis   . Sleep apnea    uses cpap    Past Surgical History:  Procedure Laterality Date  . ABDOMINAL HYSTERECTOMY  1990  . APPENDECTOMY  1970  . BACK SURGERY  2015   lower back  . CARDIAC CATHETERIZATION  03/2011  . CARPAL TUNNEL RELEASE Bilateral 2004  . CATARACT EXTRACTION    . CHOLECYSTECTOMY  1970  . COLONOSCOPY W/ BIOPSIES AND POLYPECTOMY    . CORONARY ANGIOPLASTY  2012  . CORONARY STENT PLACEMENT  03/24/2011   Dr. [name]  . EYE SURGERY  2008,2011   corneal transplant  . FEMORAL ARTERY EXPLORATION  03/25/2011   Procedure: FEMORAL ARTERY EXPLORATION;  Surgeon: Elam Dutch, MD;  Location: [facility];  Service: Vascular;  Laterality: Right;  Evacuation of Hematoma   . hematoma surgery     from cardiac cath -hematoma in groin-right  . HERNIA REPAIR  4496   umbilical  . JOINT REPLACEMENT    . KNEE ARTHROSCOPY  2006   Right  . [facility] Left Eye  2012  . LEFT HEART CATH AND CORONARY ANGIOGRAPHY N/A 01/27/2018   Procedure: LEFT HEART CATH AND CORONARY ANGIOGRAPHY;  Surgeon: [name],  Lynnell Dike, MD;  Location: [facility];  Service: Cardiovascular;  Laterality: N/A;  . LEFT HEART CATHETERIZATION WITH CORONARY ANGIOGRAM N/A 03/24/2011   Procedure: LEFT HEART CATHETERIZATION WITH CORONARY ANGIOGRAM;  Surgeon: Sinclair Grooms, MD;  Location: [facility] CATH LAB;  Service: Cardiovascular;  Laterality: N/A;  . PERCUTANEOUS CORONARY STENT INTERVENTION (PCI-S) N/A 03/24/2011   Procedure: PERCUTANEOUS CORONARY STENT INTERVENTION (PCI-S);  Surgeon: Sinclair Grooms, MD;  Location: [facility] CATH LAB;  Service: Cardiovascular;  Laterality: N/A;  . SHOULDER SURGERY  2010  . TONSILLECTOMY  1949  . TONSILLECTOMY AND ADENOIDECTOMY    . TOTAL KNEE ARTHROPLASTY  07/2010   Left  . TOTAL KNEE ARTHROPLASTY  06/17/2012   Procedure: TOTAL KNEE ARTHROPLASTY;  Surgeon: Gearlean Alf, MD;  Location: [facility]  ORS;  Service: Orthopedics;  Laterality: Right;  . TUBAL LIGATION  1978  . [place], 2007   x 2  . VIDEO BRONCHOSCOPY WITH ENDOBRONCHIAL NAVIGATION  04/13/2012   Procedure: VIDEO BRONCHOSCOPY WITH ENDOBRONCHIAL NAVIGATION;  Surgeon: Melrose Nakayama, MD;  Location: [place];  Service: Thoracic;  Laterality: N/A;  . VIDEO BRONCHOSCOPY WITH ENDOBRONCHIAL NAVIGATION N/A 04/08/2016   Procedure: VIDEO BRONCHOSCOPY WITH ENDOBRONCHIAL NAVIGATION;  Surgeon: Rigoberto Noel, MD;  Location: [facility] OR;  Service: Thoracic;  Laterality: N/A;    Current Medications: Current Meds  Medication Sig  . acetaminophen (TYLENOL) 500 MG tablet Take 500 mg by mouth every 6 (six) hours as needed for mild pain.  Marland Kitchen aspirin EC 81 MG tablet Take 81 mg by mouth daily at 3 pm.  . atorvastatin (LIPITOR) 80 MG tablet Take 1 tablet (80 mg total) by mouth daily.  . Calcium Carb-Cholecalciferol (CALCIUM + D3 PO) Take 1 tablet by mouth daily with supper.  . chlorpheniramine (CHLOR-TRIMETON) 4 MG tablet Take 8 mg by mouth at bedtime.   . furosemide (LASIX) 40 MG tablet Take 40 mg by mouth daily.  . mesalamine (APRISO) 0.375 g 24 hr capsule Take 1,500 mg by mouth daily.  . metoprolol tartrate (LOPRESSOR) 25 MG tablet Take 25 mg by mouth 2 (two) times daily as needed (for an elevated B/P).   . Multiple Vitamins-Minerals (CENTRUM SILVER 50+WOMEN) TABS Take 1 tablet by mouth daily with supper.   . nitroGLYCERIN (NITROSTAT) 0.4 MG SL tablet Place 1 tablet (0.4 mg total) under the tongue every 5 (five) minutes as needed for chest pain.  . potassium chloride SA (K-DUR,KLOR-CON) 20 MEQ tablet Take 20 mEq by mouth daily with supper.   . prednisoLONE acetate (PRED FORTE) 1 % ophthalmic suspension Place 1 drop into the left eye daily.  . Tofacitinib Citrate (XELJANZ) 10 MG TABS Take 10 mg by mouth 2 (two) times daily.  Marland Kitchen UNABLE TO FIND CPAP: At bedtime nightly and during all naps  . [DISCONTINUED] isosorbide mononitrate  (IMDUR) 30 MG 24 hr tablet Take 1 tablet (30 mg total) by mouth daily.  . [DISCONTINUED] losartan (COZAAR) 50 MG tablet Take 50 mg by mouth daily as needed (ONLY IF BLOOD PRESSURE IS GREATER THAN 110/70).      Allergies:   Codeine; Imodium a-d [loperamide hcl]; Neomycin; Penicillinase; Penicillins; Tramadol; Lisinopril; Hornet venom; Adhesive [tape]; Erythromycin; Hydromorphone; Oxycodone-acetaminophen; and Percocet [oxycodone-acetaminophen]   Social History   Socioeconomic History  . Marital status: Widowed    Spouse name: Not on file  . Number of children: Not on file  . Years of education: Not on file  . Highest education level: Not  on file  Occupational History  . Occupation: Retired    Fish farm manager: RETIRED  Social Needs  . Financial resource strain: Not on file  . Food insecurity:    Worry: Not on file    Inability: Not on file  . Transportation needs:    Medical: Not on file    Non-medical: Not on file  Tobacco Use  . Smoking status: Never Smoker  . Smokeless tobacco: Never Used  Substance and Sexual Activity  . Alcohol use: No  . Drug use: No  . Sexual activity: Not on file  Lifestyle  . Physical activity:    Days per week: Not on file    Minutes per session: Not on file  . Stress: Not on file  Relationships  . Social connections:    Talks on phone: Not on file    Gets together: Not on file    Attends religious service: Not on file    Active member of club or organization: Not on file    Attends meetings of clubs or organizations: Not on file    Relationship status: Not on file  Other Topics Concern  . Not on file  Social History Narrative   Tobacco use cigarettes: never smoked, tobacco history last updated 12/23/2013. No smoking. No alcohol. Caffeine: yes, coffee, 3 servings daily.recreational drug use: never. No diet. Exercise: walks everyday. Occupation: retired. Marital status: married. Children: boys, 2 girls, 2. Seat belt use: yes.     Family History: The  patient's family history includes Breast cancer in her maternal grandmother; Cancer in her father; Hypertension in her mother.  ROS:   Please see the history of present illness.    Right greater than left lower extremity edema.  She is seen Dr. feels and is deemed to have chronic venous insufficiency.  She has arthritis of her knees and hips that prevent ambulation of any significant distance.  All other systems reviewed and are negative.  EKGs/Labs/Other Studies Reviewed:    The following studies were reviewed today:   PET scan of the lungs: April 10, 2018 IMPRESSION: 1. Lateral right lower lobe hypermetabolic pulmonary nodule, for which primary bronchogenic carcinoma cannot be excluded. This is more laterally positioned than the nodule biopsied on 09/15/2017. Given lack of dominant nodule in this region on 07/31/2017, and prior benign biopsy in May of the more medially positioned lesion, inflammatory or infectious etiologies are also considerations. 2. Similar size of a hypermetabolic right thyroid nodule. Indeterminate. Approximately 17% of hypermetabolic thyroid nodules do harbor malignancy. 3. Colonic diverticulosis with possible sigmoid diverticulitis. 4. Hypermetabolism within the soft tissues surrounding both shoulders. This could represent osteoarthritis or an inflammatory arthropathy. 5. Dominant upper pole left renal angiomyolipoma, enlarging over prior exams.  Coronary angiography January 29, 2018: Diagnostic  Dominance: Right      EKG:  EKG is not performed today.  Most recent tracing performed on January 28, 2018 reveal first-degree AV block with slight left axis deviation.  No acute ST-T wave changes were noted.  That EKG was reviewed.  Recent Labs: 09/30/2017: Magnesium 2.0 10/26/2017: ALT 27 01/29/2018: BUN 15; Creatinine, Ser 0.92; Hemoglobin 8.8; Platelets 265; Potassium 4.0; Sodium 143  Recent Lipid Panel    Component Value Date/Time   CHOL 113  01/27/2018 0806   TRIG 35 01/27/2018 0806   HDL 60 01/27/2018 0806   CHOLHDL 1.9 01/27/2018 0806   VLDL 7 01/27/2018 0806   LDLCALC 46 01/27/2018 0806    Physical Exam:    VS:  BP (!) 154/88   Pulse (!) 59   Ht 5' 1"  (1.549 m)   Wt 205 lb 12.8 oz (93.4 kg)   SpO2 98%   BMI 38.89 kg/m     Wt Readings from Last 3 Encounters:  04/13/18 205 lb 12.8 oz (93.4 kg)  03/31/18 202 lb 9.6 oz (91.9 kg)  03/05/18 200 lb (90.7 kg)     GEN: Obese. No acute distress HEENT: Normal NECK: No JVD. LYMPHATICS: No lymphadenopathy CARDIAC: RRR.  No murmur, no gallop, and 2-3+ right lower extremity and 1-2+ left lower extremity edema. VASCULAR: Pulses 2+ radial and carotid., Bruits absent in the carotids RESPIRATORY:  Clear to auscultation without rales, wheezing or rhonchi  ABDOMEN: Soft, non-tender, non-distended, No pulsatile mass, MUSCULOSKELETAL: No deformity  SKIN: Warm and dry NEUROLOGIC:  Alert and oriented x 3 PSYCHIATRIC:  Normal affect   ASSESSMENT:    1. Atherosclerosis of native coronary artery of native heart without angina pectoris   2. Chronic diastolic CHF (congestive heart failure) ([place])   3. Mixed hyperlipidemia   4. Essential hypertension   5. Sarcoidosis    PLAN:    In order of problems listed above:  1. Has had 2 separate instances of spontaneous coronary dissection, the most recent in an obtuse marginal/circumflex distribution.  Doing well on current therapy without recurrent pain now 2 months out. 2. No evidence of volume overload.  With bilateral lower extremity edema, I have had consideration of increasing diuretic intensity.  Perhaps adding 25 mg of spironolactone will help lower extremity edema and blood pressure.  We will see her blood pressure does on the current recommendation and if we need changes, mineralocorticoid receptor antagonist would be a reasonable choice. 3. LDL target less than 70 4. Blood pressure today is elevated possibly because of no  furosemide.  Have advised the patient to discontinue Imdur and start taking losartan 50 mg every day.  Measure blood pressure 2 to 3 hours after morning blood pressure therapies.  Target blood pressure should be between 100 to 242 mmHg systolic. 5. Sarcoidosis has reactivated, possibly based upon the PET scan report as noted above.  No obvious active cardiac component.  Clinical follow-up in 4 months.  Better blood pressure control.  Phone blood pressure results.  Target blood pressure 100/60 to 130/80 mmHg range is sought.  Greater than 50% of the time during this office visit was spent in education, counseling, and coordination of care related to underlying disease process and testing as outlined.    Medication Adjustments/Labs and Tests Ordered: Current medicines are reviewed at length with the patient today.  Concerns regarding medicines are outlined above.  No orders of the defined types were placed in this encounter.  Meds ordered this encounter  Medications  . losartan (COZAAR) 50 MG tablet    Sig: Take 1 tablet (50 mg total) by mouth daily.    Dispense:  90 tablet    Refill:  3    Directions changed from prn to daily    Patient Instructions  Medication Instructions:  Your physician has recommended you make the following change in your medication:  1.) stop isosorbide (IMDUR) 30 mg 2.) change losartan 50 mg to --take one tablet every day  If you need a refill on your cardiac medications before your next appointment, please call your pharmacy.   Lab work: none If you have labs (blood work) drawn today and your tests are completely normal, you will receive your results only by: Marland Kitchen  MyChart Message (if you have MyChart) OR . A paper copy in the mail If you have any lab test that is abnormal or we need to change your treatment, we will call you to review the results.  Testing/Procedures: none  Follow-Up: At [facility], you and your health needs are our priority.  As part  of our continuing mission to provide you with exceptional heart care, we have created designated Provider Care Teams.  These Care Teams include your primary Cardiologist (physician) and Advanced Practice Providers (APPs -  Physician Assistants and Nurse Practitioners) who all work together to provide you with the care you need, when you need it. You will need a follow up appointment in 4-6 months.  Please call our office 2 months in advance to schedule this appointment.  You may see Sinclair Grooms, MD or one of the following Advanced Practice Providers on your designated Care Team:   Truitt Merle, NP Cecilie Kicks, NP . Kathyrn Drown, NP  Any Other Special Instructions Will Be Listed Below (If Applicable). Measure your blood pressure at home about 2-3 hours after morning medicines.  Call office in 7-10 days with blood pressure readings to update Dr. [name].      Signed, Sinclair Grooms, MD  04/13/2018 2:49 PM    [place]

## 2018-04-13 ENCOUNTER — Encounter: Payer: Self-pay | Admitting: Interventional Cardiology

## 2018-04-13 ENCOUNTER — Ambulatory Visit: Payer: Medicare Other | Admitting: Interventional Cardiology

## 2018-04-13 VITALS — BP 154/88 | HR 59 | Ht 61.0 in | Wt 205.8 lb

## 2018-04-13 DIAGNOSIS — I5032 Chronic diastolic (congestive) heart failure: Secondary | ICD-10-CM | POA: Diagnosis not present

## 2018-04-13 DIAGNOSIS — E782 Mixed hyperlipidemia: Secondary | ICD-10-CM | POA: Diagnosis not present

## 2018-04-13 DIAGNOSIS — I2510 Atherosclerotic heart disease of native coronary artery without angina pectoris: Secondary | ICD-10-CM | POA: Diagnosis not present

## 2018-04-13 DIAGNOSIS — I10 Essential (primary) hypertension: Secondary | ICD-10-CM

## 2018-04-13 DIAGNOSIS — D869 Sarcoidosis, unspecified: Secondary | ICD-10-CM

## 2018-04-13 MED ORDER — LOSARTAN POTASSIUM 50 MG PO TABS: 50.0000 mg | ORAL_TABLET | Freq: Every day | ORAL | 3 refills | Status: DC

## 2018-04-13 NOTE — Patient Instructions (Signed)
Medication Instructions:  Your physician has recommended you make the following change in your medication:  1.) stop isosorbide (IMDUR) 30 mg 2.) change losartan 50 mg to --take one tablet every day  If you need a refill on your cardiac medications before your next appointment, please call your pharmacy.   Lab work: none If you have labs (blood work) drawn today and your tests are completely normal, you will receive your results only by: Marland Kitchen MyChart Message (if you have MyChart) OR . A paper copy in the mail If you have any lab test that is abnormal or we need to change your treatment, we will call you to review the results.  Testing/Procedures: none  Follow-Up: At [facility], you and your health needs are our priority.  As part of our continuing mission to provide you with exceptional heart care, we have created designated Provider Care Teams.  These Care Teams include your primary Cardiologist (physician) and Advanced Practice Providers (APPs -  Physician Assistants and Nurse Practitioners) who all work together to provide you with the care you need, when you need it. You will need a follow up appointment in 4-6 months.  Please call our office 2 months in advance to schedule this appointment.  You may see Sinclair Grooms, MD or one of the following Advanced Practice Providers on your designated Care Team:   Truitt Merle, NP Cecilie Kicks, NP . Kathyrn Drown, NP  Any Other Special Instructions Will Be Listed Below (If Applicable). Measure your blood pressure at home about 2-3 hours after morning medicines.  Call office in 7-10 days with blood pressure readings to update Dr. [name].

## 2018-04-20 ENCOUNTER — Ambulatory Visit: Payer: Medicare Other | Admitting: Thoracic Surgery (Cardiothoracic Vascular Surgery)

## 2018-04-24 ENCOUNTER — Ambulatory Visit: Payer: Medicare Other | Admitting: Thoracic Surgery (Cardiothoracic Vascular Surgery)

## 2018-04-24 ENCOUNTER — Other Ambulatory Visit: Payer: Self-pay | Admitting: *Deleted

## 2018-04-24 ENCOUNTER — Other Ambulatory Visit: Payer: Self-pay | Admitting: Thoracic Surgery (Cardiothoracic Vascular Surgery)

## 2018-04-24 VITALS — BP 180/90 | HR 60 | Resp 20 | Ht 61.0 in | Wt 204.0 lb

## 2018-04-24 DIAGNOSIS — R911 Solitary pulmonary nodule: Secondary | ICD-10-CM | POA: Diagnosis not present

## 2018-04-24 DIAGNOSIS — E041 Nontoxic single thyroid nodule: Secondary | ICD-10-CM

## 2018-04-24 NOTE — Progress Notes (Signed)
[address]       [place] [address]             [phone]    HPI: Mrs. Mealor returns to discuss the results of her PET/CT  Aradia Estey is a 77 year old woman with a past medical history significant for sarcoidosis, multiple lung nodules, ulcerative colitis, CAD, reflux, obstructive sleep apnea requiring CPAP, arthritis, hypertension, and lymphedema.  She is found to have a left lower lobe lung mass in 2013.  A CT-guided biopsy showed noncaseating granulomas consistent with sarcoidosis.  That mass resolved over time.  She is been followed over time with CT scans and has had multiple nodules that have waxed and waned.  In March she had a new well-circumscribed nodule in the right lower lobe that was increasing in size.  CT-guided biopsy showed no cancer cells, but was not definitively diagnostic of sarcoidosis.  I saw her again in September.  There was a new right lower lobe lung nodule.  The previous one was slightly smaller.  An early follow-up in November showed the 14 mm nodule was now 2.5 x 1.3 cm.  She had a PET CT.  She continues to do well in terms of her ulcerative colitis.  She remains off prednisone.  She is on [place].  Past Medical History:  Diagnosis Date  . Arthritis   . Complication of anesthesia    Took a while to wake up with knee  . Coronary artery disease   . Dyspnea    with walking at times  . Fallen bladder   . GERD (gastroesophageal reflux disease)   . 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 Headache    migraines in her younger years  . Hemorrhoids   . High cholesterol   . History of blood transfusion   . Hx of adenomatous colonic polyps   . Hypertension   . Lung nodule   . Lymphedema    BLE  . Myocardial infarct ([place]) 03/24/2011   Dr. [name]  . Nocturnal leg cramps   . Obesity   . Sarcoidosis   . Sleep apnea    uses cpap    Current Outpatient  Medications  Medication Sig Dispense Refill  . acetaminophen (TYLENOL) 500 MG tablet Take 500 mg by mouth every 6 (six) hours as needed for mild pain.    Marland Kitchen aspirin EC 81 MG tablet Take 81 mg by mouth daily at 3 pm.    . atorvastatin (LIPITOR) 80 MG tablet Take 1 tablet (80 mg total) by mouth daily. 90 tablet 4  . Calcium Carb-Cholecalciferol (CALCIUM + D3 PO) Take 1 tablet by mouth daily with supper.    . chlorpheniramine (CHLOR-TRIMETON) 4 MG tablet Take 8 mg by mouth at bedtime.     . furosemide (LASIX) 40 MG tablet Take 40 mg by mouth daily.    Marland Kitchen losartan (COZAAR) 50 MG tablet Take 1 tablet (50 mg total) by mouth daily. 90 tablet 3  . mesalamine (APRISO) 0.375 g 24 hr capsule Take 1,500 mg by mouth daily.    . metoprolol tartrate (LOPRESSOR) 25 MG tablet Take 25 mg by mouth 2 (two) times daily as needed (for an elevated B/P).     . Multiple Vitamins-Minerals (CENTRUM SILVER 50+WOMEN) TABS Take 1 tablet by mouth daily with supper.     . nitroGLYCERIN (NITROSTAT) 0.4 MG  SL tablet Place 1 tablet (0.4 mg total) under the tongue every 5 (five) minutes as needed for chest pain. 25 tablet 5  . potassium chloride SA (K-DUR,KLOR-CON) 20 MEQ tablet Take 20 mEq by mouth daily with supper.     . prednisoLONE acetate (PRED FORTE) 1 % ophthalmic suspension Place 1 drop into the left eye daily.    . Tofacitinib Citrate (XELJANZ) 10 MG TABS Take 10 mg by mouth 2 (two) times daily.    Marland Kitchen UNABLE TO FIND CPAP: At bedtime nightly and during all naps     No current facility-administered medications for this visit.     Physical Exam BP (!) 180/90   Pulse 60   Resp 20   Ht 5' 1"  (1.549 m)   Wt 204 lb (92.5 kg)   SpO2 99% Comment: RA  BMI 38.33 kg/m  77 year old woman in no acute distress Alert and oriented x3 with no focal deficits Lungs clear with equal breath sounds bilaterally Cardiac regular rate and rhythm normal S1 and S2 No cervical adenopathy or thyromegaly  Diagnostic Tests: NUCLEAR MEDICINE  PET SKULL BASE TO THIGH  TECHNIQUE: 10.0 mCi F-18 FDG was injected intravenously. Full-ring PET imaging was performed from the skull base to thigh after the radiotracer. CT data was obtained and used for attenuation correction and anatomic localization.  Fasting blood glucose: 91 mg/dl  COMPARISON:  Multiple chest CTs, most recent 03/31/2018. PET 02/09/2016. biopsy results of 09/15/2017 also reviewed  FINDINGS: Mediastinal blood pool activity: SUV max 2.7  NECK: No abnormal cervical nodal hypermetabolism. A hypoattenuating right thyroid nodule measures 1.3 cm and a S.U.V. max of 3.5 on image 45/4. Compare similar in size and a S.U.V. max of 4.1 on 02/09/2016.  Incidental CT findings: No cervical adenopathy.  CHEST: No thoracic nodal hypermetabolism. Corresponding to the nodule of concern, in the lateral right lower lobe, is hypermetabolism. This measures 2.4 cm and a S.U.V. max of 5.7 on image 32/8. This is lateral to the previously described nodule on the prior PET (that nodule is not FDG avid today).  Incidental CT findings: Bilateral pulmonary nodules which have been detailed on prior diagnostic CTs. Small hiatal hernia. Aortic and coronary artery calcification.  ABDOMEN/PELVIS: No abdominopelvic parenchymal or nodal hypermetabolism.  Incidental CT findings: Hepatic cysts. A 7 mm hyperattenuating right renal lesion is indeterminate but may represent a complex cyst. There also low-density bilateral renal lesions which are likely cysts. Upper pole exophytic left renal fat density lesion measures 4.5 cm and is likely an angiomyolipoma. This is enlarged from 3.0 cm on 06/17/2016. Abdominal aortic atherosclerosis. Retroaortic left renal vein. Ventral abdominal wall hernia repair. Hysterectomy. Pelvic floor laxity. Extensive colonic diverticulosis. Possible pericolonic mild edema on image 152/4  SKELETON: Hypermetabolism within the soft tissues surrounding  both shoulders. No focal osseous hypermetabolism.  Incidental CT findings: Lumbar spine fixation.  IMPRESSION: 1. Lateral right lower lobe hypermetabolic pulmonary nodule, for which primary bronchogenic carcinoma cannot be excluded. This is more laterally positioned than the nodule biopsied on 09/15/2017. Given lack of dominant nodule in this region on 07/31/2017, and prior benign biopsy in May of the more medially positioned lesion, inflammatory or infectious etiologies are also considerations. 2. Similar size of a hypermetabolic right thyroid nodule. Indeterminate. Approximately 90% of hypermetabolic thyroid nodules do harbor malignancy. 3. Colonic diverticulosis with possible sigmoid diverticulitis. 4. Hypermetabolism within the soft tissues surrounding both shoulders. This could represent osteoarthritis or an inflammatory arthropathy. 5. Dominant upper pole left renal angiomyolipoma, enlarging over  prior exams.   Electronically Signed   By: Abigail Miyamoto M.D.   On: 04/10/2018 12:08 I personally reviewed the PET images and concur with the findings noted above  Impression: Mrs. Schrecengost is a 77 year old woman with a past medical history significant for sarcoidosis, multiple lung nodules, ulcerative colitis, coronary disease, hypertension, hyperlipidemia, reflux, arthritis, and sleep apnea.  Sarcoidosis-she has been followed by Dr. Elsworth Soho for this.  She has had multiple lung nodules over the years that have waxed and waned.  She has developed a new right lower lobe lung nodule over the past 6 months.  In the spring this was not a definitive nodule, but by September it was a 14 mm nodule.  Now is up to 2.5 cm in greatest dimension and is multilobulated.  It is hypermetabolic on PET/CT.  Differential diagnosis includes sarcoidosis and a new primary bronchogenic carcinoma.  I think we need to do a CT-guided needle biopsy.  If this is indeterminate she would need a wedge resection.  Of  sarcoidosis we would have to discuss potentially restarting prednisone.  We can have that discussion with Dr. Elsworth Soho.  If it is a cancer then we will have to discuss surgical resection versus radiation.  Thyroid nodule-hypermetabolic nodule seen on PET.  We will obtain a thyroid ultrasound and fine-needle aspiration if needed.  Hypertension-blood pressure elevated.  Probably primarily due to anxiety over results.  She will follow up with her primary  Plan: Ultrasound-guided biopsy thyroid nodule CT-guided biopsy of right lower lobe lung nodule Return in 3 weeks to discuss results.  Melrose Nakayama, MD [facility] [phone]

## 2018-04-27 ENCOUNTER — Other Ambulatory Visit: Payer: Self-pay | Admitting: *Deleted

## 2018-04-27 DIAGNOSIS — E041 Nontoxic single thyroid nodule: Secondary | ICD-10-CM

## 2018-05-01 ENCOUNTER — Other Ambulatory Visit: Payer: Self-pay | Admitting: Radiology

## 2018-05-01 ENCOUNTER — Ambulatory Visit (HOSPITAL_COMMUNITY)
Admission: RE | Admit: 2018-05-01 | Discharge: 2018-05-01 | Disposition: A | Discharge status: Home / Self Care | Payer: Medicare Other | Source: Ambulatory Visit | Attending: Thoracic Surgery (Cardiothoracic Vascular Surgery) | Admitting: Thoracic Surgery (Cardiothoracic Vascular Surgery)

## 2018-05-01 DIAGNOSIS — E041 Nontoxic single thyroid nodule: Secondary | ICD-10-CM | POA: Diagnosis present

## 2018-05-04 ENCOUNTER — Ambulatory Visit (HOSPITAL_COMMUNITY)
Admission: RE | Admit: 2018-05-04 | Discharge: 2018-05-04 | Disposition: A | Discharge status: Home / Self Care | Payer: Medicare Other | Source: Ambulatory Visit | Attending: Interventional Radiology | Admitting: Interventional Radiology

## 2018-05-04 ENCOUNTER — Other Ambulatory Visit: Payer: Self-pay

## 2018-05-04 ENCOUNTER — Encounter (HOSPITAL_COMMUNITY): Payer: Self-pay

## 2018-05-04 ENCOUNTER — Ambulatory Visit (HOSPITAL_COMMUNITY)
Admission: RE | Admit: 2018-05-04 | Discharge: 2018-05-04 | Disposition: A | Discharge status: Home / Self Care | Payer: Medicare Other | Source: Ambulatory Visit | Attending: Thoracic Surgery (Cardiothoracic Vascular Surgery) | Admitting: Thoracic Surgery (Cardiothoracic Vascular Surgery)

## 2018-05-04 DIAGNOSIS — K7689 Other specified diseases of liver: Secondary | ICD-10-CM | POA: Diagnosis not present

## 2018-05-04 DIAGNOSIS — Z6838 Body mass index (BMI) 38.0-38.9, adult: Secondary | ICD-10-CM | POA: Insufficient documentation

## 2018-05-04 DIAGNOSIS — Z79899 Other long term (current) drug therapy: Secondary | ICD-10-CM | POA: Insufficient documentation

## 2018-05-04 DIAGNOSIS — M1990 Unspecified osteoarthritis, unspecified site: Secondary | ICD-10-CM | POA: Diagnosis not present

## 2018-05-04 DIAGNOSIS — R911 Solitary pulmonary nodule: Secondary | ICD-10-CM | POA: Insufficient documentation

## 2018-05-04 DIAGNOSIS — D869 Sarcoidosis, unspecified: Secondary | ICD-10-CM | POA: Insufficient documentation

## 2018-05-04 DIAGNOSIS — Z955 Presence of coronary angioplasty implant and graft: Secondary | ICD-10-CM | POA: Insufficient documentation

## 2018-05-04 DIAGNOSIS — I700 Atherosclerosis of aorta: Secondary | ICD-10-CM | POA: Insufficient documentation

## 2018-05-04 DIAGNOSIS — K449 Diaphragmatic hernia without obstruction or gangrene: Secondary | ICD-10-CM | POA: Diagnosis not present

## 2018-05-04 DIAGNOSIS — G4730 Sleep apnea, unspecified: Secondary | ICD-10-CM | POA: Insufficient documentation

## 2018-05-04 DIAGNOSIS — K5190 Ulcerative colitis, unspecified, without complications: Secondary | ICD-10-CM | POA: Diagnosis not present

## 2018-05-04 DIAGNOSIS — K5730 Diverticulosis of large intestine without perforation or abscess without bleeding: Secondary | ICD-10-CM | POA: Insufficient documentation

## 2018-05-04 DIAGNOSIS — E041 Nontoxic single thyroid nodule: Secondary | ICD-10-CM | POA: Diagnosis not present

## 2018-05-04 DIAGNOSIS — I10 Essential (primary) hypertension: Secondary | ICD-10-CM | POA: Diagnosis not present

## 2018-05-04 DIAGNOSIS — I890 Lymphedema, not elsewhere classified: Secondary | ICD-10-CM | POA: Insufficient documentation

## 2018-05-04 DIAGNOSIS — E669 Obesity, unspecified: Secondary | ICD-10-CM | POA: Insufficient documentation

## 2018-05-04 DIAGNOSIS — E7800 Pure hypercholesterolemia, unspecified: Secondary | ICD-10-CM | POA: Insufficient documentation

## 2018-05-04 DIAGNOSIS — Z7982 Long term (current) use of aspirin: Secondary | ICD-10-CM | POA: Diagnosis not present

## 2018-05-04 DIAGNOSIS — I252 Old myocardial infarction: Secondary | ICD-10-CM | POA: Insufficient documentation

## 2018-05-04 DIAGNOSIS — D1771 Benign lipomatous neoplasm of kidney: Secondary | ICD-10-CM | POA: Diagnosis not present

## 2018-05-04 DIAGNOSIS — K219 Gastro-esophageal reflux disease without esophagitis: Secondary | ICD-10-CM | POA: Insufficient documentation

## 2018-05-04 DIAGNOSIS — I2510 Atherosclerotic heart disease of native coronary artery without angina pectoris: Secondary | ICD-10-CM | POA: Diagnosis not present

## 2018-05-04 DIAGNOSIS — Z96653 Presence of artificial knee joint, bilateral: Secondary | ICD-10-CM | POA: Diagnosis not present

## 2018-05-04 DIAGNOSIS — Z9889 Other specified postprocedural states: Secondary | ICD-10-CM

## 2018-05-04 LAB — PROTIME-INR
INR: 1.11
Prothrombin Time: 14.2 seconds (ref 11.4–15.2)

## 2018-05-04 LAB — CBC
HCT: 35.9 % — ABNORMAL LOW (ref 36.0–46.0)
Hemoglobin: 11 g/dL — ABNORMAL LOW (ref 12.0–15.0)
MCH: 27.4 pg (ref 26.0–34.0)
MCHC: 30.6 g/dL (ref 30.0–36.0)
MCV: 89.3 fL (ref 80.0–100.0)
Platelets: 299 10*3/uL (ref 150–400)
RBC: 4.02 MIL/uL (ref 3.87–5.11)
RDW: 18.9 % — ABNORMAL HIGH (ref 11.5–15.5)
WBC: 6.8 10*3/uL (ref 4.0–10.5)
nRBC: 0 % (ref 0.0–0.2)

## 2018-05-04 MED ORDER — SODIUM CHLORIDE 0.9 % IV SOLN: INTRAVENOUS | Status: DC

## 2018-05-04 MED ORDER — LIDOCAINE HCL 1 % IJ SOLN
INTRAMUSCULAR | Status: AC
  Filled 2018-05-04: qty 20

## 2018-05-04 MED ORDER — FENTANYL CITRATE (PF) 100 MCG/2ML IJ SOLN
INTRAMUSCULAR | Status: AC
  Filled 2018-05-04: qty 2

## 2018-05-04 MED ORDER — SODIUM CHLORIDE 0.9 % IV SOLN
INTRAVENOUS | Status: AC | PRN
  Administered 2018-05-04: 10 mL/h via INTRAVENOUS

## 2018-05-04 MED ORDER — MIDAZOLAM HCL 2 MG/2ML IJ SOLN
INTRAMUSCULAR | Status: AC | PRN
  Administered 2018-05-04: 1 mg via INTRAVENOUS

## 2018-05-04 MED ORDER — FENTANYL CITRATE (PF) 100 MCG/2ML IJ SOLN
INTRAMUSCULAR | Status: AC | PRN
  Administered 2018-05-04: 25 ug via INTRAVENOUS

## 2018-05-04 MED ORDER — MIDAZOLAM HCL 2 MG/2ML IJ SOLN
INTRAMUSCULAR | Status: AC
  Filled 2018-05-04: qty 2

## 2018-05-04 NOTE — H&P (Signed)
Chief Complaint: Patient was seen in consultation today for right lateral lung lesion biopsy at the request of Hendrickson,Steven C  Referring Physician(s): [place] C  Supervising Physician: Marybelle Killings  Patient Status: [facility] - Out-pt  History of Present Illness: Isabella [name] is a 77 y.o. female   Known sarcoidosis; lung nodules; Ulc colitis; CAD; HTN; Lymphedema In March she had a new well-circumscribed nodule in the right lower lobe that was increasing in size.  CT-guided biopsy showed no cancer cells, but was not definitively diagnostic of sarcoidosis.  I saw her again in September.  There was a new right lower lobe lung nodule.  The previous one was slightly smaller.  An early follow-up in November showed the 14 mm nodule was now 2.5 x 1.3 cm.  She had a PET CT.  PET: 04/10/18: IMPRESSION: 1. Lateral right lower lobe hypermetabolic pulmonary nodule, for which primary bronchogenic carcinoma cannot be excluded. This is more laterally positioned than the nodule biopsied on 09/15/2017. Given lack of dominant nodule in this region on 07/31/2017, and prior benign biopsy in May of the more medially positioned lesion, inflammatory or infectious etiologies are also considerations. 2. Similar size of a hypermetabolic right thyroid nodule. Indeterminate. Approximately 76% of hypermetabolic thyroid nodules do harbor malignancy. 3. Colonic diverticulosis with possible sigmoid diverticulitis. 4. Hypermetabolism within the soft tissues surrounding both shoulders. This could represent osteoarthritis or an inflammatory arthropathy. 5. Dominant upper pole left renal angiomyolipoma, enlarging over prior exams.   Now scheduled for more lateral Rt lung lesion bx per Dr [name]   Past Medical History:  Diagnosis Date  . Arthritis   . Complication of anesthesia    Took a while to wake up with knee  . Coronary artery disease   . Dyspnea    with walking at times  .  Fallen bladder   . GERD (gastroesophageal reflux disease)   . 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 Headache    migraines in her younger years  . Hemorrhoids   . High cholesterol   . History of blood transfusion   . Hx of adenomatous colonic polyps   . Hypertension   . Lung nodule   . Lymphedema    BLE  . Myocardial infarct ([place]) 03/24/2011   Dr. [name]  . Nocturnal leg cramps   . Obesity   . Sarcoidosis   . Sleep apnea    uses cpap    Past Surgical History:  Procedure Laterality Date  . ABDOMINAL HYSTERECTOMY  1990  . APPENDECTOMY  1970  . BACK SURGERY  2015   lower back  . CARDIAC CATHETERIZATION  03/2011  . CARPAL TUNNEL RELEASE Bilateral 2004  . CATARACT EXTRACTION    . CHOLECYSTECTOMY  1970  . COLONOSCOPY W/ BIOPSIES AND POLYPECTOMY    . CORONARY ANGIOPLASTY  2012  . CORONARY STENT PLACEMENT  03/24/2011   Dr. [name]  . EYE SURGERY  2008,2011   corneal transplant  . FEMORAL ARTERY EXPLORATION  03/25/2011   Procedure: FEMORAL ARTERY EXPLORATION;  Surgeon: Elam Dutch, MD;  Location: [place];  Service: Vascular;  Laterality: Right;  Evacuation of Hematoma   . hematoma surgery     from cardiac cath -hematoma in groin-right  . HERNIA REPAIR  1607   umbilical  . JOINT REPLACEMENT    . KNEE ARTHROSCOPY  2006   Right  . Laser Surgery Left  Eye  2012  . LEFT HEART CATH AND CORONARY ANGIOGRAPHY N/A 01/27/2018   Procedure: LEFT HEART CATH AND CORONARY ANGIOGRAPHY;  Surgeon: Belva Crome, MD;  Location: [place] [facility];  Service: Cardiovascular;  Laterality: N/A;  . LEFT HEART CATHETERIZATION WITH CORONARY ANGIOGRAM N/A 03/24/2011   Procedure: LEFT HEART CATHETERIZATION WITH CORONARY ANGIOGRAM;  Surgeon: Sinclair Grooms, MD;  Location: [facility] CATH LAB;  Service: Cardiovascular;  Laterality: N/A;  . PERCUTANEOUS CORONARY STENT INTERVENTION (PCI-S) N/A 03/24/2011   Procedure: PERCUTANEOUS  CORONARY STENT INTERVENTION (PCI-S);  Surgeon: Sinclair Grooms, MD;  Location: [facility] CATH LAB;  Service: Cardiovascular;  Laterality: N/A;  . SHOULDER SURGERY  2010  . TONSILLECTOMY  1949  . TONSILLECTOMY AND ADENOIDECTOMY    . TOTAL KNEE ARTHROPLASTY  07/2010   Left  . TOTAL KNEE ARTHROPLASTY  06/17/2012   Procedure: TOTAL KNEE ARTHROPLASTY;  Surgeon: Gearlean Alf, MD;  Location: [facility];  Service: Orthopedics;  Laterality: Right;  . TUBAL LIGATION  1978  . [place], 2007   x 2  . VIDEO BRONCHOSCOPY WITH ENDOBRONCHIAL NAVIGATION  04/13/2012   Procedure: VIDEO BRONCHOSCOPY WITH ENDOBRONCHIAL NAVIGATION;  Surgeon: Melrose Nakayama, MD;  Location: [place];  Service: Thoracic;  Laterality: N/A;  . VIDEO BRONCHOSCOPY WITH ENDOBRONCHIAL NAVIGATION N/A 04/08/2016   Procedure: VIDEO BRONCHOSCOPY WITH ENDOBRONCHIAL NAVIGATION;  Surgeon: Rigoberto Noel, MD;  Location: [place];  Service: Thoracic;  Laterality: N/A;    Allergies: Codeine; Imodium a-d [loperamide hcl]; Neomycin; Penicillinase; Penicillins; Tramadol; Lisinopril; Hornet venom; Adhesive [tape]; Erythromycin; Hydromorphone; and Percocet [oxycodone-acetaminophen]  Medications: Prior to Admission medications   Medication Sig Start Date End Date Taking? Authorizing Provider  acetaminophen (TYLENOL) 500 MG tablet Take 500 mg by mouth every 6 (six) hours as needed for mild pain.   Yes [provider]  aspirin EC 81 MG tablet Take 81 mg by mouth daily at 3 pm.   Yes [provider]  atorvastatin (LIPITOR) 80 MG tablet Take 1 tablet (80 mg total) by mouth daily. 01/29/18  Yes Kathyrn Drown D, NP  Calcium Carb-Cholecalciferol (CALCIUM + D3 PO) Take 1 tablet by mouth daily with supper.   Yes [provider]  chlorpheniramine (CHLOR-TRIMETON) 4 MG tablet Take 8 mg by mouth at bedtime.    Yes [provider]  furosemide (LASIX) 40 MG tablet Take 40 mg by mouth daily.   Yes [provider]  losartan (COZAAR) 50 MG tablet Take 1 tablet (50 mg total) by mouth daily. 04/13/18  Yes Belva Crome, MD  mesalamine (APRISO) 0.375 g 24 hr capsule Take 1,500 mg by mouth daily.   Yes [provider]  metoprolol tartrate (LOPRESSOR) 25 MG tablet Take 25 mg by mouth 2 (two) times daily.    Yes [provider]  Multiple Vitamins-Minerals (CENTRUM SILVER 50+WOMEN) TABS Take 1 tablet by mouth daily with supper.    Yes [provider]  nitroGLYCERIN (NITROSTAT) 0.4 MG SL tablet Place 1 tablet (0.4 mg total) under the tongue every 5 (five) minutes as needed for chest pain. 12/12/17  Yes Imogene Burn, PA-C  potassium chloride SA (K-DUR,KLOR-CON) 20 MEQ tablet Take 20 mEq by mouth daily with supper.    Yes [provider]  prednisoLONE acetate (PRED FORTE) 1 % ophthalmic suspension Place 1 drop into the left eye daily.   Yes [provider]  Tofacitinib Citrate (XELJANZ) 10 MG TABS Take 10 mg by mouth  2 (two) times daily.   Yes [provider]  UNABLE TO FIND CPAP: At bedtime nightly and during all naps   Yes [provider]     Family History  Problem Relation Age of Onset  . Hypertension Mother   . Cancer Father   . Breast cancer Maternal Grandmother     Social History   Socioeconomic History  . Marital status: Widowed    Spouse name: Not on file  . Number of children: Not on file  . Years of education: Not on file  . Highest education level: Not on file  Occupational History  . Occupation: Retired    Fish farm manager: RETIRED  Social Needs  . Financial resource strain: Not on file  . Food insecurity:    Worry: Not on file    Inability: Not on file  . Transportation needs:    Medical: Not on file    Non-medical: Not on file  Tobacco Use  . Smoking status: Never Smoker  . Smokeless tobacco: Never Used  Substance and Sexual Activity  . Alcohol use: No  . Drug use: No  . Sexual activity: Not on file  Lifestyle  .  Physical activity:    Days per week: Not on file    Minutes per session: Not on file  . Stress: Not on file  Relationships  . Social connections:    Talks on phone: Not on file    Gets together: Not on file    Attends religious service: Not on file    Active member of club or organization: Not on file    Attends meetings of clubs or organizations: Not on file    Relationship status: Not on file  Other Topics Concern  . Not on file  Social History Narrative   Tobacco use cigarettes: never smoked, tobacco history last updated 12/23/2013. No smoking. No alcohol. Caffeine: yes, coffee, 3 servings daily.recreational drug use: never. No diet. Exercise: walks everyday. Occupation: retired. Marital status: married. Children: boys, 2 girls, 2. Seat belt use: yes.    Review of Systems: A 12 point ROS discussed and pertinent positives are indicated in the HPI above.  All other systems are negative.  Review of Systems  Constitutional: Negative for fatigue.  Respiratory: Positive for shortness of breath.   Cardiovascular: Negative for chest pain.  Gastrointestinal: Negative for abdominal pain.  Musculoskeletal: Positive for gait problem.  Neurological: Positive for weakness.  Psychiatric/Behavioral: Negative for behavioral problems and confusion.    Vital Signs: BP (!) 151/77   Pulse 65   Temp 98.1 F (36.7 C)   Resp 18   Ht 5' 1"  (1.549 m)   Wt 204 lb (92.5 kg)   SpO2 100%   BMI 38.55 kg/m   Physical Exam Vitals signs reviewed.  Cardiovascular:     Rate and Rhythm: Normal rate and regular rhythm.  Pulmonary:     Effort: Pulmonary effort is normal.     Breath sounds: Normal breath sounds.  Abdominal:     General: Bowel sounds are normal.     Palpations: Abdomen is soft.  Musculoskeletal: Normal range of motion.  Skin:    General: Skin is warm and dry.  Neurological:     General: No focal deficit present.     Mental Status: She is oriented to person, place, and time.    Psychiatric:        Mood and Affect: Mood normal.        Behavior: Behavior normal.  Thought Content: Thought content normal.        Judgment: Judgment normal.     Imaging: Nm Pet Image Restag (ps) Skull Base To Thigh  Result Date: 04/10/2018 CLINICAL DATA:  Subsequent treatment strategy for enlarging pulmonary nodule. History sarcoidosis. EXAM: NUCLEAR MEDICINE PET SKULL BASE TO THIGH TECHNIQUE: 10.0 mCi F-18 FDG was injected intravenously. Full-ring PET imaging was performed from the skull base to thigh after the radiotracer. CT data was obtained and used for attenuation correction and anatomic localization. Fasting blood glucose: 91 mg/dl COMPARISON:  Multiple chest CTs, most recent 03/31/2018. PET 02/09/2016. biopsy results of 09/15/2017 also reviewed FINDINGS: Mediastinal blood pool activity: SUV max 2.7 NECK: No abnormal cervical nodal hypermetabolism. A hypoattenuating right thyroid nodule measures 1.3 cm and a S.U.V. max of 3.5 on image 45/4. Compare similar in size and a S.U.V. max of 4.1 on 02/09/2016. Incidental CT findings: No cervical adenopathy. CHEST: No thoracic nodal hypermetabolism. Corresponding to the nodule of concern, in the lateral right lower lobe, is hypermetabolism. This measures 2.4 cm and a S.U.V. max of 5.7 on image 32/8. This is lateral to the previously described nodule on the prior PET (that nodule is not FDG avid today). Incidental CT findings: Bilateral pulmonary nodules which have been detailed on prior diagnostic CTs. Small hiatal hernia. Aortic and coronary artery calcification. ABDOMEN/PELVIS: No abdominopelvic parenchymal or nodal hypermetabolism. Incidental CT findings: Hepatic cysts. A 7 mm hyperattenuating right renal lesion is indeterminate but may represent a complex cyst. There also low-density bilateral renal lesions which are likely cysts. Upper pole exophytic left renal fat density lesion measures 4.5 cm and is likely an angiomyolipoma. This is  enlarged from 3.0 cm on 06/17/2016. Abdominal aortic atherosclerosis. Retroaortic left renal vein. Ventral abdominal wall hernia repair. Hysterectomy. Pelvic floor laxity. Extensive colonic diverticulosis. Possible pericolonic mild edema on image 152/4 SKELETON: Hypermetabolism within the soft tissues surrounding both shoulders. No focal osseous hypermetabolism. Incidental CT findings: Lumbar spine fixation. IMPRESSION: 1. Lateral right lower lobe hypermetabolic pulmonary nodule, for which primary bronchogenic carcinoma cannot be excluded. This is more laterally positioned than the nodule biopsied on 09/15/2017. Given lack of dominant nodule in this region on 07/31/2017, and prior benign biopsy in May of the more medially positioned lesion, inflammatory or infectious etiologies are also considerations. 2. Similar size of a hypermetabolic right thyroid nodule. Indeterminate. Approximately 54% of hypermetabolic thyroid nodules do harbor malignancy. 3. Colonic diverticulosis with possible sigmoid diverticulitis. 4. Hypermetabolism within the soft tissues surrounding both shoulders. This could represent osteoarthritis or an inflammatory arthropathy. 5. Dominant upper pole left renal angiomyolipoma, enlarging over prior exams. Electronically Signed   By: Abigail Miyamoto M.D.   On: 04/10/2018 12:08   US Thyroid  Result Date: 05/01/2018 CLINICAL DATA:  Incidental on PET. 77 year old female with a hypermetabolic right-sided thyroid nodule on PET imaging. EXAM: THYROID ULTRASOUND TECHNIQUE: Ultrasound examination of the thyroid gland and adjacent soft tissues was performed. COMPARISON:  PET-CT 04/10/2018 FINDINGS: Parenchymal Echotexture: Mildly heterogenous Isthmus: 0.5 cm Right lobe: 5.1 x 1.6 x 1.8 cm Left lobe: 4.7 x 1.4 x 1.3 cm _________________________________________________________ Estimated total number of nodules >/= 1 cm: 1 Number of spongiform nodules >/=  2 cm not described below (TR1): 0 Number of mixed  cystic and solid nodules >/= 1.5 cm not described below ([place]): 0 _________________________________________________________ Nodule # 1: Location: Right; Inferior Maximum size: 2.6 cm; Other 2 dimensions: 1.4 x 1.8 cm Composition: solid/almost completely solid (2) Echogenicity: isoechoic (1) Shape: not taller-than-wide (0)  Margins: ill-defined (0) Echogenic foci: none (0) ACR TI-RADS total points: 3. ACR TI-RADS risk category: TR3 (3 points). ACR TI-RADS recommendations: **Given size (>/= 2.5 cm) and appearance, fine needle aspiration of this mildly suspicious nodule should be considered based on TI-RADS criteria. _________________________________________________________ IMPRESSION: Solitary 2.6 cm TI-RADS category 3 nodule in the right mid to lower gland meets criteria for fine-needle aspiration biopsy. The above is in keeping with the ACR TI-RADS recommendations - J Am Coll Radiol 2017;14:587-595. Electronically Signed   By: Jacqulynn Cadet M.D.   On: 05/01/2018 13:14    Labs:  CBC: Recent Labs    01/27/18 0103 01/27/18 0151 01/27/18 0806 01/28/18 0343 01/29/18 0237  WBC 3.6*  --  4.3 4.2 3.4*  HGB 9.6* 9.9* 9.3* 8.7* 8.8*  HCT 31.4* 29.0* 31.3* 29.7* 28.8*  PLT 297  --  285 283 265    COAGS: Recent Labs    09/15/17 1011  INR 1.09    BMP: Recent Labs    10/29/17 0636 01/27/18 0103 01/27/18 0151 01/27/18 0806 01/29/18 0237  NA 144 142 141 143 143  K 3.8 3.3* 3.4* 4.1 4.0  CL 109 105 107 109 110  CO2 29 23  --  26 27  GLUCOSE 87 111* 103* 91 98  BUN 13 16 20 13 15   CALCIUM 8.0* 9.1  --  8.6* 8.5*  CREATININE 0.82 0.96 0.90 0.92 0.92  GFRNONAA >60 56*  --  59* 59*  GFRAA >60 >60  --  >60 >60    LIVER FUNCTION TESTS: Recent Labs    10/26/17 2028  BILITOT 0.5  AST 34  ALT 27  ALKPHOS 62  PROT 5.5*  ALBUMIN 2.8*    TUMOR MARKERS: No results for input(s): AFPTM, CEA, CA199, CHROMGRNA in the last 8760 hours.  Assessment and Plan:  Known Sarcoidosis Right lung  lesion biopsied 09/2017: negative New more lateral right lung lesion; +PET Scheduled for biopsy of this nodule Risks and benefits discussed with the patient including, but not limited to bleeding, hemoptysis, respiratory failure requiring intubation, infection, pneumothorax requiring chest tube placement, stroke from air embolism or even death.  All of the patient's questions were answered, patient is agreeable to proceed. Consent signed and in chart.    Thank you for this interesting consult.  I greatly enjoyed meeting Adryanna W Groll and look forward to participating in their care.  A copy of this report was sent to the requesting provider on this date.  Electronically Signed: Lavonia Drafts, PA-C 05/04/2018, 9:43 AM   I spent a total of    25 Minutes in face to face in clinical consultation, greater than 50% of which was counseling/coordinating care for right lateral lung lesion biopsy

## 2018-05-04 NOTE — Discharge Instructions (Signed)
Needle Biopsy, Care After These instructions tell you how to care for yourself after your procedure. Your doctor may also give you more specific instructions. Call your doctor if you have any problems or questions. What can I expect after the procedure? After the procedure, it is common to have:  Soreness.  Bruising.  Mild pain. Follow these instructions at home:   Return to your normal activities as told by your doctor. Ask your doctor what activities are safe for you.  Take over-the-counter and prescription medicines only as told by your doctor.  Wash your hands with soap and water before you change your bandage (dressing). If you cannot use soap and water, use hand sanitizer.  Follow instructions from your doctor about: ? How to take care of your puncture site. ? When and how to change your bandage. ? When to remove your bandage.  Check your puncture site every day for signs of infection. Watch for: ? Redness, swelling, or pain. ? Fluid or blood. ? Pus or a bad smell. ? Warmth.  Do not take baths, swim, or use a hot tub until your doctor approves. Ask your doctor if you may take showers. You may only be allowed to take sponge baths.  Keep all follow-up visits as told by your doctor. This is important. Contact a doctor if you have:  A fever.  Redness, swelling, or pain at the puncture site, and it lasts longer than a few days.  Fluid, blood, or pus coming from the puncture site.  Warmth coming from the puncture site. Get help right away if:  You have a lot of bleeding from the puncture site. Summary  After the procedure, it is common to have soreness, bruising, or mild pain at the puncture site.  Check your puncture site every day for signs of infection, such as redness, swelling, or pain.  Get help right away if you have severe bleeding from your puncture site. This information is not intended to replace advice given to you by your health care provider. Make  sure you discuss any questions you have with your health care provider. Document Released: 04/11/2008 Document Revised: 05/12/2017 Document Reviewed: 05/12/2017 Elsevier Interactive Patient Education  2019 [employer].

## 2018-05-04 NOTE — Procedures (Signed)
RLL Lung Nodule Bx 18 g times 6 EBL 0 Comp 0

## 2018-05-12 ENCOUNTER — Ambulatory Visit: Payer: Medicare Other | Admitting: Thoracic Surgery (Cardiothoracic Vascular Surgery)

## 2018-05-21 ENCOUNTER — Other Ambulatory Visit: Payer: Medicare Other

## 2018-05-26 ENCOUNTER — Ambulatory Visit: Payer: Medicare Other | Admitting: Thoracic Surgery (Cardiothoracic Vascular Surgery)

## 2018-06-03 ENCOUNTER — Other Ambulatory Visit (HOSPITAL_COMMUNITY)
Admission: RE | Admit: 2018-06-03 | Discharge: 2018-06-03 | Disposition: A | Discharge status: Home / Self Care | Payer: Medicare Other | Source: Ambulatory Visit | Attending: Radiology | Admitting: Radiology

## 2018-06-03 ENCOUNTER — Ambulatory Visit
Admission: RE | Admit: 2018-06-03 | Discharge: 2018-06-03 | Disposition: A | Discharge status: Home / Self Care | Payer: Medicare Other | Source: Ambulatory Visit | Attending: Thoracic Surgery (Cardiothoracic Vascular Surgery) | Admitting: Thoracic Surgery (Cardiothoracic Vascular Surgery)

## 2018-06-03 DIAGNOSIS — E041 Nontoxic single thyroid nodule: Secondary | ICD-10-CM

## 2018-06-08 ENCOUNTER — Other Ambulatory Visit: Payer: Self-pay

## 2018-06-08 ENCOUNTER — Encounter: Payer: Self-pay | Admitting: Thoracic Surgery (Cardiothoracic Vascular Surgery)

## 2018-06-08 ENCOUNTER — Ambulatory Visit: Payer: Medicare Other | Admitting: Thoracic Surgery (Cardiothoracic Vascular Surgery)

## 2018-06-08 VITALS — BP 146/84 | HR 64 | Resp 16 | Ht 61.0 in | Wt 198.0 lb

## 2018-06-08 DIAGNOSIS — R911 Solitary pulmonary nodule: Secondary | ICD-10-CM | POA: Diagnosis not present

## 2018-06-08 DIAGNOSIS — Z9889 Other specified postprocedural states: Secondary | ICD-10-CM | POA: Diagnosis not present

## 2018-06-08 DIAGNOSIS — E041 Nontoxic single thyroid nodule: Secondary | ICD-10-CM | POA: Diagnosis not present

## 2018-06-08 NOTE — Progress Notes (Signed)
Isabella [name]       Isabella [name],[place] [id number]             [phone]     HPI: Isabella [name] returns to discuss the results of her biopsies.  Isabella [name] is a 78 year old [name] with a history of sarcoidosis, multiple lung nodules, ulcerative colitis, coronary disease, reflux, obstructive sleep apnea, arthritis, hypertension, and lymphedema.  She was found to have a left lower lobe lung mass back in 2013.  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  That mass resolved over time.  She has been followed with CT scans and there have been waxing and waning nodules.  In March 2019 she had a new well-circumscribed nodule that was increasing in size.  CT-guided biopsy was not definitively diagnostic of sarcoidosis, but also did not show malignancy.  In September there was a new nodule.  Follow-up in November showed nodule had increased in size from 14 mm to 25 mm in greatest dimension.  On PET CT the nodule was hypermetabolic.  She had a CT-guided needle biopsy.  She also had a hypermetabolic thyroid nodule.  A thyroid ultrasound showed the lesion was suspicious and cytology was recommended.  A fine-needle aspiration of that was done last week.  She continues to do well with her ulcerative colitis.  She remains off prednisone.  She is on Isabella [name].  Past Medical History:  Diagnosis Date  . Arthritis   . Complication of anesthesia    Took a while to wake up with knee  . Coronary artery disease   . Dyspnea    with walking at times  . Fallen bladder   . GERD (gastroesophageal reflux disease)   . Granulomatous lung disease (Isabella [name])   . 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 Headache    migraines in her younger years  . Hemorrhoids   . High cholesterol   . History of blood transfusion   . Hx of adenomatous colonic polyps   . Hypertension   . Lung nodule   . Lymphedema    BLE  . Myocardial infarct ([place])  03/24/2011   Isabella [name]  . Nocturnal leg cramps   . Obesity   . Sarcoidosis   . Sleep apnea    uses cpap    Current Outpatient Medications  Medication Sig Dispense Refill  . acetaminophen (TYLENOL) 500 MG tablet Take 500 mg by mouth every 6 (six) hours as needed for mild pain.    Marland Kitchen aspirin EC 81 MG tablet Take 81 mg by mouth daily at 3 pm.    . atorvastatin (LIPITOR) 80 MG tablet Take 1 tablet (80 mg total) by mouth daily. 90 tablet 4  . Calcium Carb-Cholecalciferol (CALCIUM + D3 PO) Take 1 tablet by mouth daily with supper.    . chlorpheniramine (CHLOR-TRIMETON) 4 MG tablet Take 8 mg by mouth at bedtime.     . furosemide (LASIX) 40 MG tablet Take 40 mg by mouth daily.    Marland Kitchen losartan (COZAAR) 50 MG tablet Take 1 tablet (50 mg total) by mouth daily. 90 tablet 3  . mesalamine (APRISO) 0.375 g 24 hr capsule Take 1,500 mg by mouth daily.    . metoprolol tartrate (LOPRESSOR) 25 MG tablet Take 25 mg by mouth 2 (two) times daily.     . Multiple Vitamins-Minerals (CENTRUM SILVER 50+WOMEN) TABS Take 1 tablet by mouth daily with supper.     Marland Kitchen  nitroGLYCERIN (NITROSTAT) 0.4 MG SL tablet Place 1 tablet (0.4 mg total) under the tongue every 5 (five) minutes as needed for chest pain. 25 tablet 5  . potassium chloride SA (K-DUR,KLOR-CON) 20 MEQ tablet Take 20 mEq by mouth daily with supper.     . prednisoLONE acetate (PRED FORTE) 1 % ophthalmic suspension Place 1 drop into the left eye daily.    . Tofacitinib Citrate (XELJANZ) Isabella MG TABS Take Isabella mg by mouth 2 (two) times daily.    Marland Kitchen UNABLE TO FIND CPAP: At bedtime nightly and during all naps     No current facility-administered medications for this visit.     Physical Exam BP (!) 146/84 (BP Location: Left Arm, Patient Position: Sitting, Cuff Size: Large)   Pulse 64   Resp 16   Ht 5' 1"  (1.549 m)   Wt 198 lb (89.8 kg)   SpO2 99% Comment: ON RA  BMI 37.1 kg/m  Isabella [name] in no acute distress Alert and oriented x3 with no focal  deficits Well-developed and well-nourished Lungs clear  Diagnostic Tests:  THYROID, FINE NEEDLE ASPIRATION, RLP (SPECIMEN 1 OF 1 COLLECTED 06-03-2018) CONSISTENT WITH BENIGN FOLLICULAR NODULE (BETHESDA CATEGORY II). Isabella Males MD Pathologist, Electronic Signature (Case signed 06/05/2018)   Lung, biopsy, Right Lower Lobe - INFLAMED LUNG PARENCHYMA WITH ABUNDANT NECROSIS AND VAGUE GRANULOMATOUS-LIKE INFLAMMATION. - THERE IS NO EVIDENCE OF MALIGNANCY. - SEE COMMENT. Microscopic Comment AFB, GMS, and PAS stains are negative for the presence of microorganisms. Isabella Cutter MD Pathologist, Electronic Signature (Case signed 05/07/2018)  Impression: Isabella [name] is a very nice 78 year old [name] with multiple medical problems.  She has longstanding ulcerative colitis.  She had been on prednisone for that as well as sarcoidosis for a long time.  She was first diagnosed with sarcoidosis back in 2013.  She had a lot of side effects from prednisone and wanted to stop that.  Recently she has been on a combination of Morrie Sheldon and Apriso for her ulcerative colitis.  She has had good control of her symptoms with that regimen.  Unfortunately, while on that the regimen her lung nodules have gotten bigger.  She had one nodule was growing relatively rapidly in the right lower lobe.  CT-guided needle biopsy again showed no malignancy there was extensive granulomatous inflammation.  Stains for organisms were negative.  I suspect this is again her sarcoidosis.  She needs to follow-up with Isabella [name] determine if treatment is necessary for the sarcoidosis.  We will help her arrange that appointment.  I will go ahead and schedule her to come back in 6 months with a CT scan, but will defer to Isabella [name] if he would like to do the follow-up on that.  Thyroid nodule-cytology consistent with benign follicular nodule.  Plan: Follow-up with Isabella [name] to discuss whether to consider prednisone or other option for treatment  of sarcoidosis  Plan to do a repeat CT in about 6 months to check on her lung nodules.  If Isabella [name] would like to do that in follow-up I will defer to him.  Isabella Nakayama, MD [facility] [phone]

## 2018-07-01 ENCOUNTER — Ambulatory Visit: Payer: Medicare Other | Admitting: Pulmonary Disease

## 2018-07-01 ENCOUNTER — Encounter: Payer: Self-pay | Admitting: Pulmonary Disease

## 2018-07-01 VITALS — BP 122/78 | HR 54 | Ht 61.0 in | Wt 197.0 lb

## 2018-07-01 DIAGNOSIS — D869 Sarcoidosis, unspecified: Secondary | ICD-10-CM | POA: Diagnosis not present

## 2018-07-01 DIAGNOSIS — G4733 Obstructive sleep apnea (adult) (pediatric): Secondary | ICD-10-CM | POA: Diagnosis not present

## 2018-07-01 DIAGNOSIS — R911 Solitary pulmonary nodule: Secondary | ICD-10-CM | POA: Diagnosis not present

## 2018-07-01 NOTE — Assessment & Plan Note (Addendum)
Schedule PFTs Biopsy showed granulomatous inflammation, may be related to sarcoidosis or ulcerative colitis Of note, these nodules/granulomas date back to 2013 , UC dates back about 3 years  If PFTs are normal, then she would not need steroids UC symptoms seem to be well controlled on current regimen

## 2018-07-01 NOTE — Patient Instructions (Signed)
Schedule PFTs Biopsy showed inflammation, may be related to sarcoidosis or ulcerative colitis  CPAP is working well, change to a new mask

## 2018-07-01 NOTE — Progress Notes (Signed)
Subjective:    Patient ID: Isabella [name], female    DOB: 11-15-1940, 78 y.o.   MRN: [id number]  HPI  77/F , never smokerfor follow-up of obstructive sleep apnea &Pulmonary nodules Shehad a LLL nodulebiopsy in 2014 showing nonnecrotizing granuloma She also has ulcerative colitis and corneal transplants   RLL nodule had been stable since 2013,But growth noted in 2017, gradual increase from 12 to 16 mm by 2019 Of note this was noted to be PETnegative in 2013, left lower lobe nodule had lighted up and hence this was biopsied and showed initially negative in 04/2012 and then nonnecrotizing granuloma 05/2012 onrepeat biopsy  Chief Complaint  Patient presents with  . Follow-up    Per Dr. [name] to discuss treatment for sarcoidosis. Stated that prednisone causes her to have diarrhea in the past.     She had a busy year with hospitalization for UC flare, then for right lower extremity cellulitis and then for chest pain which ended up being a dissection of OM1  Follow-up CT and PET scan showed hypermetabolic enlarging right lower lobe nodule, this is enlarged as much as 25 mm, she underwent CT guided biopsy on 12/23 which showed granulomatous inflammation   Thyroid nodule FNA  Neg  CPAP is working well, she admits to leak, pressure is okay, download shows residual AHI 6/hour on 12 cm with excellent compliance and large leak  Ulcerative colitis symptoms are now well controlled on mesalamine and Xeljanz 5 mg twice daily, she is not on prednisone anymore    Significant tests/ events reviewed  PET 06/6946 Hypermetabolic right lower lobe nodule,SUV 4.0 Scattered pulmonary nodules unchanged from 2015, right upper lobe 6 mm nodule SUV 2.0  03/2016 ENB neg  CT chest 07/2017 mild increase in right lower lobe nodule 15 x 16 mm, other nodules all unchanged   PFTs 03/2012 nml   Past Medical History:  Diagnosis Date  . Arthritis   . Complication of anesthesia    Took a  while to wake up with knee  . Coronary artery disease   . Dyspnea    with walking at times  . Fallen bladder   . GERD (gastroesophageal reflux disease)   . Granulomatous lung disease ([place])   . 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 Headache    migraines in her younger years  . Hemorrhoids   . High cholesterol   . History of blood transfusion   . Hx of adenomatous colonic polyps   . Hypertension   . Lung nodule   . Lymphedema    BLE  . Myocardial infarct ([place]) 03/24/2011   Dr. [name]  . Nocturnal leg cramps   . Obesity   . Sarcoidosis   . Sleep apnea    uses cpap    Review of Systems neg for any significant sore throat, dysphagia, itching, sneezing, nasal congestion or excess/ purulent secretions, fever, chills, sweats, unintended wt loss, pleuritic or exertional cp, hempoptysis, orthopnea pnd or change in chronic leg swelling. Also denies presyncope, palpitations, heartburn, abdominal pain, nausea, vomiting, diarrhea or change in bowel or urinary habits, dysuria,hematuria, rash, arthralgias, visual complaints, headache, numbness weakness or ataxia.     Objective:   Physical Exam   Gen. Pleasant, obese, in no distress ENT - no lesions, no post nasal drip Neck: No JVD, no thyromegaly, no carotid bruits Lungs: no use of accessory muscles, no dullness to percussion, decreased without rales or rhonchi  Cardiovascular: Rhythm regular, heart sounds  normal, no murmurs or gallops, no peripheral edema Musculoskeletal: No deformities, no cyanosis or clubbing , no tremors        Assessment & Plan:

## 2018-07-01 NOTE — Assessment & Plan Note (Signed)
Current settings seem okay, AHI residual is about 6/hour which is acceptable at this time She will change to a new full facemask and hopefully this should decrease leak  Weight loss encouraged, compliance with goal of at least 4-6 hrs every night is the expectation. Advised against medications with sedative side effects Cautioned against driving when sleepy - understanding that sleepiness will vary on a day to day basis

## 2018-07-06 ENCOUNTER — Ambulatory Visit (INDEPENDENT_AMBULATORY_CARE_PROVIDER_SITE_OTHER): Payer: Medicare Other | Admitting: Pulmonary Disease

## 2018-07-06 DIAGNOSIS — R911 Solitary pulmonary nodule: Secondary | ICD-10-CM

## 2018-07-06 LAB — PULMONARY FUNCTION TEST
DL/VA % pred: 114 %
DL/VA: 4.71 ml/min/mmHg/L
DLCO UNC: 19.83 ml/min/mmHg
DLCO unc % pred: 107 %
FEF 25-75 Post: 1.64 L/sec
FEF 25-75 Pre: 1.82 L/sec
FEF2575-%Change-Post: -9 %
FEF2575-%Pred-Post: 102 %
FEF2575-%Pred-Pre: 113 %
FEV1-%Change-Post: -3 %
FEV1-%Pred-Post: 95 %
FEV1-%Pred-Pre: 99 %
FEV1-Post: 1.93 L
FEV1-Pre: 2.01 L
FEV1FVC-%Change-Post: 0 %
FEV1FVC-%Pred-Pre: 104 %
FEV6-%Change-Post: -4 %
FEV6-%Pred-Post: 96 %
FEV6-%Pred-Pre: 100 %
FEV6-Post: 2.46 L
FEV6-Pre: 2.56 L
FEV6FVC-%Pred-Post: 105 %
FEV6FVC-%Pred-Pre: 105 %
FVC-%Change-Post: -4 %
FVC-%Pred-Post: 91 %
FVC-%Pred-Pre: 95 %
FVC-Post: 2.46 L
FVC-Pre: 2.56 L
POST FEV6/FVC RATIO: 100 %
Post FEV1/FVC ratio: 78 %
Pre FEV1/FVC ratio: 78 %
Pre FEV6/FVC Ratio: 100 %
RV % pred: 98 %
RV: 2.21 L
TLC % PRED: 94 %
TLC: 4.66 L

## 2018-07-06 NOTE — Progress Notes (Signed)
Full PFT performed today. °

## 2018-07-28 ENCOUNTER — Telehealth: Payer: Self-pay

## 2018-07-28 NOTE — Telephone Encounter (Signed)
Patient returning phone call to [facility] regarding results of her PFT.   Notes recorded by Rigoberto Noel, MD on 07/06/2018 at 1:30 PM EST PFTs are normal. No need for steroids  Made aware of results. Voiced understanding. Nothing further is needed at this time.

## 2018-09-17 ENCOUNTER — Telehealth: Payer: Self-pay | Admitting: Cardiology

## 2018-09-17 NOTE — Telephone Encounter (Signed)
New message     Virtual video visit scheduled for 09-22-18.  Pt will have current bp reading available for nurse.  Patient gave consent for video visit.  YOUR CARDIOLOGY TEAM HAS ARRANGED FOR AN E-VISIT FOR YOUR APPOINTMENT - PLEASE REVIEW IMPORTANT INFORMATION BELOW SEVERAL DAYS PRIOR TO YOUR APPOINTMENT  Due to the recent COVID-19 pandemic, we are transitioning in-person office visits to tele-medicine visits in an effort to decrease unnecessary exposure to our patients, their families, and staff. These visits are billed to your insurance just like a normal visit is. We also encourage you to sign up for MyChart if you have not already done so. You will need a smartphone if possible. For patients that do not have this, we can still complete the visit using a regular telephone but do prefer a smartphone to enable video when possible. You may have a family member that lives with you that can help. If possible, we also ask that you have a blood pressure cuff and scale at home to measure your blood pressure, heart rate and weight prior to your scheduled appointment. Patients with clinical needs that need an in-person evaluation and testing will still be able to come to the office if absolutely necessary. If you have any questions, feel free to call our office.     YOUR PROVIDER WILL BE USING THE FOLLOWING PLATFORM TO COMPLETE YOUR VISIT: Doximity   IF USING MYCHART - How to Download the MyChart App to Your SmartPhone   - If Apple, go to [employer] and type in MyChart in the search bar and download the app. If Android, ask patient to go to [employer] and type in [place] in the search bar and download the app. The app is free but as with any other app downloads, your phone may require you to verify saved payment information or Apple/Android password.  - You will need to then log into the app with your MyChart username and password, and select [facility] as your healthcare provider to link the  account.  - When it is time for your visit, go to the MyChart app, find appointments, and click Begin Video Visit. Be sure to Select Allow for your device to access the Microphone and Camera for your visit. You will then be connected, and your provider will be with you shortly.  **If you have any issues connecting or need assistance, please contact MyChart service desk [phone])**  **If using a computer, in order to ensure the best quality for your visit, you will need to use either of the following Internet Browsers: Insurance underwriter or Microsoft Edge**   IF USING DOXIMITY or [email or web address] - The staff will give you instructions on receiving your link to join the meeting the day of your visit.      2-3 DAYS BEFORE YOUR APPOINTMENT  You will receive a telephone call from one of our [place] team members - your caller ID may say "Unknown caller." If this is a video visit, we will walk you through how to get the video launched on your phone. We will remind you check your blood pressure, heart rate and weight prior to your scheduled appointment. If you have an Apple Watch or Kardia, please upload any pertinent ECG strips the day before or morning of your appointment to [place]. Our staff will also make sure you have reviewed the consent and agree to move forward with your scheduled tele-health visit.     THE DAY OF YOUR  APPOINTMENT  Approximately 15 minutes prior to your scheduled appointment, you will receive a telephone call from one of [place] team - your caller ID may say "Unknown caller."  Our staff will confirm medications, vital signs for the day and any symptoms you may be experiencing. Please have this information available prior to the time of visit start. It may also be helpful for you to have a pad of paper and pen handy for any instructions given during your visit. They will also walk you through joining the smartphone meeting if this is a video visit.    CONSENT FOR  TELE-HEALTH VISIT - PLEASE REVIEW  I hereby voluntarily request, consent and authorize [facility] and its employed or contracted physicians, physician assistants, nurse practitioners or other licensed health care professionals (the Practitioner), to provide me with telemedicine health care services (the Services") as deemed necessary by the treating Practitioner. I acknowledge and consent to receive the Services by the Practitioner via telemedicine. I understand that the telemedicine visit will involve communicating with the Practitioner through live audiovisual communication technology and the disclosure of certain medical information by electronic transmission. I acknowledge that I have been given the opportunity to request an in-person assessment or other available alternative prior to the telemedicine visit and am voluntarily participating in the telemedicine visit.  I understand that I have the right to withhold or withdraw my consent to the use of telemedicine in the course of my care at any time, without affecting my right to future care or treatment, and that the Practitioner or I may terminate the telemedicine visit at any time. I understand that I have the right to inspect all information obtained and/or recorded in the course of the telemedicine visit and may receive copies of available information for a reasonable fee.  I understand that some of the potential risks of receiving the Services via telemedicine include:   Delay or interruption in medical evaluation due to technological equipment failure or disruption;  Information transmitted may not be sufficient (e.g. poor resolution of images) to allow for appropriate medical decision making by the Practitioner; and/or   In rare instances, security protocols could fail, causing a breach of personal health information.  Furthermore, I acknowledge that it is my responsibility to provide information about my medical history, conditions and  care that is complete and accurate to the best of my ability. I acknowledge that Practitioner's advice, recommendations, and/or decision may be based on factors not within their control, such as incomplete or inaccurate data provided by me or distortions of diagnostic images or specimens that may result from electronic transmissions. I understand that the practice of medicine is not an exact science and that Practitioner makes no warranties or guarantees regarding treatment outcomes. I acknowledge that I will receive a copy of this consent concurrently upon execution via email to the email address I last provided but may also request a printed copy by calling the office of [facility].    I understand that my insurance will be billed for this visit.   I have read or had this consent read to me.  I understand the contents of this consent, which adequately explains the benefits and risks of the Services being provided via telemedicine.   I have been provided ample opportunity to ask questions regarding this consent and the Services and have had my questions answered to my satisfaction.  I give my informed consent for the services to be provided through the use of telemedicine in  my medical care  By participating in this telemedicine visit I agree to the above.

## 2018-09-22 ENCOUNTER — Encounter: Payer: Self-pay | Admitting: Cardiology

## 2018-09-22 ENCOUNTER — Other Ambulatory Visit: Payer: Self-pay

## 2018-09-22 ENCOUNTER — Telehealth (INDEPENDENT_AMBULATORY_CARE_PROVIDER_SITE_OTHER): Payer: Medicare Other | Admitting: Cardiology

## 2018-09-22 ENCOUNTER — Other Ambulatory Visit: Payer: Self-pay | Admitting: *Deleted

## 2018-09-22 VITALS — BP 134/71 | HR 55 | Ht 61.0 in | Wt 196.0 lb

## 2018-09-22 DIAGNOSIS — R911 Solitary pulmonary nodule: Secondary | ICD-10-CM

## 2018-09-22 DIAGNOSIS — I2510 Atherosclerotic heart disease of native coronary artery without angina pectoris: Secondary | ICD-10-CM | POA: Diagnosis not present

## 2018-09-22 DIAGNOSIS — E782 Mixed hyperlipidemia: Secondary | ICD-10-CM

## 2018-09-22 DIAGNOSIS — D869 Sarcoidosis, unspecified: Secondary | ICD-10-CM

## 2018-09-22 DIAGNOSIS — I5032 Chronic diastolic (congestive) heart failure: Secondary | ICD-10-CM

## 2018-09-22 DIAGNOSIS — I10 Essential (primary) hypertension: Secondary | ICD-10-CM

## 2018-09-22 NOTE — Patient Instructions (Signed)
Medication Instructions:  Your physician recommends that you continue on your current medications as directed. Please refer to the Current Medication list given to you today.  If you need a refill on your cardiac medications before your next appointment, please call your pharmacy.   Lab work: NONE  If you have labs (blood work) drawn today and your tests are completely normal, you will receive your results only by: Marland Kitchen MyChart Message (if you have MyChart) OR . A paper copy in the mail If you have any lab test that is abnormal or we need to change your treatment, we will call you to review the results.  Testing/Procedures: NONE  Follow-Up: At [facility], you and your health needs are our priority.  As part of our continuing mission to provide you with exceptional heart care, we have created designated Provider Care Teams.  These Care Teams include your primary Cardiologist (physician) and Advanced Practice Providers (APPs -  Physician Assistants and Nurse Practitioners) who all work together to provide you with the care you need, when you need it. You will need a follow up appointment in 6 months.  Please call our office 2 months in advance to schedule this appointment.  You may see Sinclair Grooms, MD or one of the following Advanced Practice Providers on your designated Care Team:   Truitt Merle, NP Cecilie Kicks, NP . Kathyrn Drown, NP

## 2018-09-22 NOTE — Progress Notes (Signed)
Virtual Visit via Telephone Note   This visit type was conducted due to national recommendations for restrictions regarding the COVID-19 Pandemic (e.g. social distancing) in an effort to limit this patient's exposure and mitigate transmission in our community.  Due to her co-morbid illnesses, this patient is at least at moderate risk for complications without adequate follow up.  This format is felt to be most appropriate for this patient at this time.  The patient did not have access to video technology/had technical difficulties with video requiring transitioning to audio format only (telephone).  All issues noted in this document were discussed and addressed.  No physical exam could be performed with this format.  Please refer to the patient's chart for her  consent to telehealth for [facility].   Pt could not figure out how to respond to text   Date:  09/22/2018   ID:  Isabella [name], DOB March 28, 1941, MRN [id number]  Patient Location: Home Provider Location: Office  PCP:  [name], MD (Inactive)  Cardiologist:  Sinclair Grooms, MD  Electrophysiologist:  None   Evaluation Performed:  Follow-Up Visit  Chief Complaint:  CAD with SCAD  History of Present Illness:    Isabella [name] is a 78 y.o. 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   Hx of sarcoidosis multiple lung nodules OSA- per pulmonary , lymphedema ulcerative colitis.  She did have biopsy of thyroid nodule and lung nodule both neg. For malignancy.   Dr. Elsworth Soho has followed and did PFTs that were normal and he did not believe she needed further steroids.    Imdur caused headaches.  She is using 30 mg at nighttime. Stopped on last visit  Today she is feeling well with no chest pain or SOB.   She has not been out really since 07/29/18 Her family does all the shopping for her.  If she had to go out she will wear a mask.   The patient does not have symptoms concerning for COVID-19 infection (fever, chills, cough, or new shortness of breath).    Past Medical History:  Diagnosis Date  . Arthritis   . Complication of anesthesia    Took a while to wake up with knee  . Coronary artery disease   . Dyspnea    with walking at times  . Fallen bladder   . GERD (gastroesophageal reflux disease)   . 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 Headache    migraines in her younger years  . Hemorrhoids   . High cholesterol   . History of blood transfusion   . Hx of adenomatous colonic polyps   . Hypertension   . Lung nodule   . Lymphedema    BLE  . Myocardial infarct ([place]) 03/24/2011   Dr. [name]  . Nocturnal leg cramps   . Obesity   . Sarcoidosis   . Sleep apnea    uses cpap   Past Surgical History:  Procedure Laterality Date  . ABDOMINAL HYSTERECTOMY  1990  . APPENDECTOMY  1970  . BACK SURGERY  2015   lower back  . CARDIAC CATHETERIZATION  03/2011  . CARPAL TUNNEL RELEASE Bilateral 2004  . CATARACT EXTRACTION    .  CHOLECYSTECTOMY  1970  . COLONOSCOPY W/ BIOPSIES AND POLYPECTOMY    . CORONARY ANGIOPLASTY  2012  . CORONARY STENT PLACEMENT  03/24/2011   Dr. [name]  . EYE SURGERY  2008,2011   corneal transplant  . FEMORAL ARTERY EXPLORATION  03/25/2011   Procedure: FEMORAL ARTERY EXPLORATION;  Surgeon: Elam Dutch, MD;  Location: [place];  Service: Vascular;  Laterality: Right;  Evacuation of Hematoma   . hematoma surgery     from cardiac cath -hematoma in groin-right  . HERNIA REPAIR  7371   umbilical  . JOINT REPLACEMENT    . KNEE ARTHROSCOPY  2006   Right  . [facility] Left Eye  2012  . LEFT HEART CATH AND CORONARY ANGIOGRAPHY N/A 01/27/2018   Procedure: LEFT HEART CATH AND CORONARY  ANGIOGRAPHY;  Surgeon: Belva Crome, MD;  Location: [place] CV LAB;  Service: Cardiovascular;  Laterality: N/A;  . LEFT HEART CATHETERIZATION WITH CORONARY ANGIOGRAM N/A 03/24/2011   Procedure: LEFT HEART CATHETERIZATION WITH CORONARY ANGIOGRAM;  Surgeon: Sinclair Grooms, MD;  Location: [facility] CATH LAB;  Service: Cardiovascular;  Laterality: N/A;  . PERCUTANEOUS CORONARY STENT INTERVENTION (PCI-S) N/A 03/24/2011   Procedure: PERCUTANEOUS CORONARY STENT INTERVENTION (PCI-S);  Surgeon: Sinclair Grooms, MD;  Location: [facility] CATH LAB;  Service: Cardiovascular;  Laterality: N/A;  . SHOULDER SURGERY  2010  . TONSILLECTOMY  1949  . TONSILLECTOMY AND ADENOIDECTOMY    . TOTAL KNEE ARTHROPLASTY  07/2010   Left  . TOTAL KNEE ARTHROPLASTY  06/17/2012   Procedure: TOTAL KNEE ARTHROPLASTY;  Surgeon: Gearlean Alf, MD;  Location: [facility] OR[facility];  Service: Orthopedics;  Laterality: Right;  . TUBAL LIGATION  1978  . [place], 2007   x 2  . VIDEO BRONCHOSCOPY WITH ENDOBRONCHIAL NAVIGATION  04/13/2012   Procedure: VIDEO BRONCHOSCOPY WITH ENDOBRONCHIAL NAVIGATION;  Surgeon: Melrose Nakayama, MD;  Location: [place];  Service: Thoracic;  Laterality: N/A;  . VIDEO BRONCHOSCOPY WITH ENDOBRONCHIAL NAVIGATION N/A 04/08/2016   Procedure: VIDEO BRONCHOSCOPY WITH ENDOBRONCHIAL NAVIGATION;  Surgeon: Rigoberto Noel, MD;  Location: [facility] OR;  Service: Thoracic;  Laterality: N/A;     Current Meds  Medication Sig  . acetaminophen (TYLENOL) 500 MG tablet Take 500 mg by mouth every 6 (six) hours as needed for mild pain.  Marland Kitchen aspirin EC 81 MG tablet Take 81 mg by mouth daily at 3 pm.  . atorvastatin (LIPITOR) 80 MG tablet Take 1 tablet (80 mg total) by mouth daily.  . Calcium Carb-Cholecalciferol (CALCIUM + D3 PO) Take 1 tablet by mouth daily with supper.  . chlorpheniramine (CHLOR-TRIMETON) 4 MG tablet Take 8 mg by mouth at bedtime.   . furosemide (LASIX) 40 MG tablet Take 40 mg by mouth daily.  Marland Kitchen losartan  (COZAAR) 50 MG tablet Take 1 tablet (50 mg total) by mouth daily.  . mesalamine (APRISO) 0.375 g 24 hr capsule Take 1,500 mg by mouth daily.  . metoprolol tartrate (LOPRESSOR) 25 MG tablet Take 25 mg by mouth 2 (two) times daily.   . Multiple Vitamins-Minerals (CENTRUM SILVER 50+WOMEN) TABS Take 1 tablet by mouth daily with supper.   . nitroGLYCERIN (NITROSTAT) 0.4 MG SL tablet Place 1 tablet (0.4 mg total) under the tongue every 5 (five) minutes as needed for chest pain.  . potassium chloride SA (K-DUR,KLOR-CON) 20 MEQ tablet Take 20 mEq by mouth daily with supper.   . prednisoLONE acetate (PRED FORTE) 1 % ophthalmic suspension Place 1 drop  into the left eye daily.  . Tofacitinib Citrate 5 MG TABS Take 5 mg by mouth 2 (two) times daily.  Marland Kitchen UNABLE TO FIND CPAP: At bedtime nightly and during all naps     Allergies:   Codeine; Imodium a-d [loperamide hcl]; Neomycin; Penicillinase; Penicillins; Tramadol; Lisinopril; Hornet venom; Adhesive [tape]; Erythromycin; Hydromorphone; and Percocet [oxycodone-acetaminophen]   Social History   Tobacco Use  . Smoking status: Never Smoker  . Smokeless tobacco: Never Used  Substance Use Topics  . Alcohol use: No  . Drug use: No     Family Hx: The patient's family history includes Breast cancer in her maternal grandmother; Cancer in her father; Hypertension in her mother.  ROS:   Please see the history of present illness.    General:no colds or fevers, no weight changes Skin:no rashes or ulcers HEENT:no blurred vision, no congestion CV:see HPI PUL:see HPI GI:no diarrhea constipation or melena, no indigestion GU:no hematuria, no dysuria MS:no joint pain, no claudication Neuro:no syncope, no lightheadedness Endo:no diabetes, no thyroid disease  All other systems reviewed and are negative.   Prior CV studies:   The following studies were reviewed today:  PET scan of the lungs: April 10, 2018 IMPRESSION: 1. Lateral right lower lobe  hypermetabolic pulmonary nodule, for which primary bronchogenic carcinoma cannot be excluded. This is more laterally positioned than the nodule biopsied on 09/15/2017. Given lack of dominant nodule in this region on 07/31/2017, and prior benign biopsy in May of the more medially positioned lesion, inflammatory or infectious etiologies are also considerations. 2. Similar size of a hypermetabolic right thyroid nodule. Indeterminate. Approximately 00% of hypermetabolic thyroid nodules do harbor malignancy. 3. Colonic diverticulosis with possible sigmoid diverticulitis. 4. Hypermetabolism within the soft tissues surrounding both shoulders. This could represent osteoarthritis or an inflammatory arthropathy. 5. Dominant upper pole left renal angiomyolipoma, enlarging over prior exams.  Coronary angiography January 29, 2018: Diagnostic  Dominance: Right      Labs/Other Tests and Data Reviewed:    EKG:  An ECG dated 01-27-18 was personally reviewed today and demonstrated:  SR with 1st degree AV block no acute changes  Recent Labs: 09/30/2017: Magnesium 2.0 10/26/2017: ALT 27 01/29/2018: BUN 15; Creatinine, Ser 0.92; Potassium 4.0; Sodium 143 05/04/2018: Hemoglobin 11.0; Platelets 299   Recent Lipid Panel Lab Results  Component Value Date/Time   CHOL 113 01/27/2018 08:06 AM   TRIG 35 01/27/2018 08:06 AM   HDL 60 01/27/2018 08:06 AM   CHOLHDL 1.9 01/27/2018 08:06 AM   LDLCALC 46 01/27/2018 08:06 AM    Wt Readings from Last 3 Encounters:  09/22/18 196 lb (88.9 kg)  07/01/18 197 lb (89.4 kg)  06/08/18 198 lb (89.8 kg)     Objective:    Vital Signs:  BP 134/71   Pulse (!) 55   Ht 5' 1"  (1.549 m)   Wt 196 lb (88.9 kg)   BMI 37.03 kg/m    VITAL SIGNS:  reviewed  General Alert and oriented female in NAD Neuro A&O X 3  Lungs, can speak in complete sentences without SOB   Psych:  Pleasant affect   ASSESSMENT & PLAN:    1. SCAD hx times 2 episodes.  Currently no pain and  is doing well 2. Chronic diastolic HF, denies any SOB or increased edema of ext.  3. HLD with last LDL of 46 well controlled. 4. HTN controlled no change in meds 5. Sarcoidosis stable. No longer on steroids.  COVID-19 Education: The signs and symptoms of  COVID-19 were discussed with the patient and how to seek care for testing (follow up with PCP or arrange E-visit).  The importance of social distancing was discussed today.  Time:   Today, I have spent 15 minutes with the patient with telehealth technology discussing the above problems.     Medication Adjustments/Labs and Tests Ordered: Current medicines are reviewed at length with the patient today.  Concerns regarding medicines are outlined above.   Tests Ordered: No orders of the defined types were placed in this encounter.   Medication Changes: No orders of the defined types were placed in this encounter.   Disposition:  Follow up in 6 month(s)  Signed, Cecilie Kicks, NP  09/22/2018 3:17 PM    [facility]

## 2018-12-03 ENCOUNTER — Ambulatory Visit
Admission: RE | Admit: 2018-12-03 | Discharge: 2018-12-03 | Disposition: A | Discharge status: Home / Self Care | Payer: Medicare Other | Source: Ambulatory Visit | Attending: Thoracic Surgery (Cardiothoracic Vascular Surgery) | Admitting: Thoracic Surgery (Cardiothoracic Vascular Surgery)

## 2018-12-03 DIAGNOSIS — R911 Solitary pulmonary nodule: Secondary | ICD-10-CM

## 2018-12-08 ENCOUNTER — Encounter: Payer: Self-pay | Admitting: Thoracic Surgery (Cardiothoracic Vascular Surgery)

## 2018-12-08 ENCOUNTER — Ambulatory Visit: Payer: Medicare Other | Admitting: Thoracic Surgery (Cardiothoracic Vascular Surgery)

## 2018-12-08 ENCOUNTER — Other Ambulatory Visit: Payer: Self-pay

## 2018-12-08 VITALS — BP 162/72 | HR 60 | Temp 97.5°F | Resp 16 | Ht 61.0 in | Wt 200.0 lb

## 2018-12-08 DIAGNOSIS — R918 Other nonspecific abnormal finding of lung field: Secondary | ICD-10-CM | POA: Diagnosis not present

## 2018-12-08 DIAGNOSIS — D869 Sarcoidosis, unspecified: Secondary | ICD-10-CM | POA: Diagnosis not present

## 2018-12-08 NOTE — Progress Notes (Signed)
[address]       [place] [address]             [phone]      HPI: [name] returns for a scheduled six-month follow-up  Isabella [name] is a 78 year old woman with a past medical history of sarcoidosis, lung nodules, ulcerative colitis, coronary disease, reflux, sleep apnea, arthritis, hypertension, and lymphedema.  She was found to have a left lower lobe lung mass in 2013.  A CT-guided biopsy showed noncaseating granulomas.  The mass resolved over time.  She is been followed off and on since then with waxing and waning nodules.  In March 2018 there was a new nodule that showed activity on PET.  CT-guided biopsy showed no evidence of malignancy but was not definitively diagnostic for sarcoidosis either.  She has been feeling well.  She is off prednisone.  She continues to take Morrie Sheldon which has been very effective for her ulcerative colitis.  She is not having any cough, wheezing, or shortness of breath. Past Medical History:  Diagnosis Date  . Arthritis   . Complication of anesthesia    Took a while to wake up with knee  . Coronary artery disease   . Dyspnea    with walking at times  . Fallen bladder   . GERD (gastroesophageal reflux disease)   . 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 Headache    migraines in her younger years  . Hemorrhoids   . High cholesterol   . History of blood transfusion   . Hx of adenomatous colonic polyps   . Hypertension   . Lung nodule   . Lymphedema    BLE  . Myocardial infarct ([place]) 03/24/2011   Dr. [name]  . Nocturnal leg cramps   . Obesity   . Sarcoidosis   . Sleep apnea    uses cpap    Current Outpatient Medications  Medication Sig Dispense Refill  . acetaminophen (TYLENOL) 500 MG tablet Take 500 mg by mouth every 6 (six) hours as needed for mild pain.    Marland Kitchen aspirin EC 81 MG tablet Take 81 mg by mouth daily at 3 pm.    . atorvastatin  (LIPITOR) 80 MG tablet Take 1 tablet (80 mg total) by mouth daily. 90 tablet 4  . Calcium Carb-Cholecalciferol (CALCIUM + D3 PO) Take 1 tablet by mouth daily with supper.    . chlorpheniramine (CHLOR-TRIMETON) 4 MG tablet Take 8 mg by mouth at bedtime.     . furosemide (LASIX) 40 MG tablet Take 40 mg by mouth daily.    Marland Kitchen losartan (COZAAR) 50 MG tablet Take 1 tablet (50 mg total) by mouth daily. 90 tablet 3  . mesalamine (APRISO) 0.375 g 24 hr capsule Take 1,500 mg by mouth daily.    . metoprolol tartrate (LOPRESSOR) 25 MG tablet Take 25 mg by mouth 2 (two) times daily.     . Multiple Vitamins-Minerals (CENTRUM SILVER 50+WOMEN) TABS Take 1 tablet by mouth daily with supper.     . nitroGLYCERIN (NITROSTAT) 0.4 MG SL tablet Place 1 tablet (0.4 mg total) under the tongue every 5 (five) minutes as needed for chest pain. 25 tablet 5  . potassium chloride SA (K-DUR,KLOR-CON) 20 MEQ tablet Take 20 mEq by mouth daily with supper.     . Tofacitinib Citrate 5 MG TABS Take 5 mg by mouth  2 (two) times daily. TAKING XELJANZ BRAND NAME    . UNABLE TO FIND CPAP: At bedtime nightly and during all naps     No current facility-administered medications for this visit.     Physical Exam BP (!) 162/72 (BP Location: Right Arm, Patient Position: Sitting, Cuff Size: Large) Comment: has not taken fluid pill today  Pulse 60   Temp (!) 97.5 F (36.4 C) Comment: thermal  Resp 16   Ht 5' 1"  (1.549 m)   Wt 200 lb (90.7 kg)   SpO2 95% Comment: RA  BMI 37.63 kg/m  78 year old woman in no acute distress Alert and oriented x3 with no focal deficits Lungs clear with equal breath sounds bilaterally Cardiac regular rate and rhythm  Diagnostic Tests: CT CHEST WITHOUT CONTRAST  TECHNIQUE: Multidetector CT imaging of the chest was performed following the standard protocol without IV contrast.  COMPARISON:  PET-CT 04/10/2018 and multiple prior chest CTs  FINDINGS: Cardiovascular: The heart is normal in size and  stable. No pericardial effusion. Stable mild tortuosity and scattered calcifications involving the thoracic aorta. Stable coronary artery calcifications.  Mediastinum/Nodes: 9 small stable mediastinal and hilar lymph nodes. No mass or overt adenopathy. The esophagus is grossly normal.  Lungs/Pleura: Stable emphysematous changes and pulmonary scarring. Numerous bilateral pulmonary nodules are noted. These appears stable. The largest lesion in the right lower lobe adjacent to the major fissure has tiny calcifications and is consistent with a benign granuloma. Measures approximately 16 x 16 mm and previously measured 24.5 x 18.5 mm. Elongated nodular lesion along the left major fissure in the left lower lobe is also smaller.  14 mm nodule in the right lower lobe on image number 102 previously measured 15.5 mm.  No new pulmonary lesions. No acute overlying pulmonary process. No pleural effusions.  Upper Abdomen: Stable CT appearance of the upper abdomen. Benign/simple hepatic cysts are noted along with stable changes in the left perinephric fat. No adrenal gland lesions are identified.  Musculoskeletal: No chest wall mass, breast masses, supraclavicular or axillary lymphadenopathy. The bony structures are stable. Advanced degenerative changes involving the cervical and thoracic spine.  IMPRESSION: 1. 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 2. No new or progressive findings in the lungs. 3. No acute overlying pulmonary process. 4. No mediastinal or hilar mass or adenopathy.  Aortic Atherosclerosis (ICD10-I70.0) and Emphysema (ICD10-J43.9).   Electronically Signed   By: Marijo Sanes M.D.   On: 12/03/2018 13:43 I personally reviewed the CT images and concur with the findings noted above.  Impression: Isabella [name] is a 78 year old woman with a history of sarcoidosis, ulcerative colitis, and multiple lung nodules.  She has had 2 nodules that were  suspicious enough to be biopsied in the past.  1 of those was definitively a granuloma.  The other showed inflammation but was not definitive for sarcoidosis.  Her CT today shows stable to improved bilateral pulmonary nodules.  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  We discussed doing another CT in a year just to be on the safe side.  She does wish to have that done.  I will arrange for that.  Ulcerative colitis-symptoms well controlled on Xeljanz.  Plan: Return in 1 year with CT chest  Melrose Nakayama, MD [facility] [phone]

## 2019-02-17 ENCOUNTER — Other Ambulatory Visit: Payer: Self-pay | Admitting: Cardiology

## 2019-03-09 ENCOUNTER — Encounter (HOSPITAL_COMMUNITY): Payer: Self-pay | Admitting: Emergency Medicine

## 2019-03-09 ENCOUNTER — Observation Stay (HOSPITAL_COMMUNITY)
Admission: EM | Admit: 2019-03-09 | Discharge: 2019-03-11 | Disposition: A | Discharge status: Home / Self Care | Payer: Medicare Other | Attending: Family Medicine | Admitting: Family Medicine

## 2019-03-09 ENCOUNTER — Other Ambulatory Visit: Payer: Self-pay

## 2019-03-09 DIAGNOSIS — E7800 Pure hypercholesterolemia, unspecified: Secondary | ICD-10-CM | POA: Insufficient documentation

## 2019-03-09 DIAGNOSIS — E785 Hyperlipidemia, unspecified: Secondary | ICD-10-CM | POA: Diagnosis not present

## 2019-03-09 DIAGNOSIS — I2510 Atherosclerotic heart disease of native coronary artery without angina pectoris: Secondary | ICD-10-CM | POA: Diagnosis not present

## 2019-03-09 DIAGNOSIS — E669 Obesity, unspecified: Secondary | ICD-10-CM | POA: Diagnosis not present

## 2019-03-09 DIAGNOSIS — I82811 Embolism and thrombosis of superficial veins of right lower extremities: Secondary | ICD-10-CM | POA: Diagnosis present

## 2019-03-09 DIAGNOSIS — Z6839 Body mass index (BMI) 39.0-39.9, adult: Secondary | ICD-10-CM | POA: Insufficient documentation

## 2019-03-09 DIAGNOSIS — G4733 Obstructive sleep apnea (adult) (pediatric): Secondary | ICD-10-CM | POA: Insufficient documentation

## 2019-03-09 DIAGNOSIS — D869 Sarcoidosis, unspecified: Secondary | ICD-10-CM | POA: Insufficient documentation

## 2019-03-09 DIAGNOSIS — I252 Old myocardial infarction: Secondary | ICD-10-CM | POA: Diagnosis not present

## 2019-03-09 DIAGNOSIS — L03115 Cellulitis of right lower limb: Secondary | ICD-10-CM | POA: Diagnosis not present

## 2019-03-09 DIAGNOSIS — I10 Essential (primary) hypertension: Secondary | ICD-10-CM | POA: Insufficient documentation

## 2019-03-09 DIAGNOSIS — Z79899 Other long term (current) drug therapy: Secondary | ICD-10-CM | POA: Diagnosis not present

## 2019-03-09 DIAGNOSIS — H18519 Endothelial corneal dystrophy, unspecified eye: Secondary | ICD-10-CM | POA: Diagnosis not present

## 2019-03-09 DIAGNOSIS — Z955 Presence of coronary angioplasty implant and graft: Secondary | ICD-10-CM | POA: Insufficient documentation

## 2019-03-09 DIAGNOSIS — Z20828 Contact with and (suspected) exposure to other viral communicable diseases: Secondary | ICD-10-CM | POA: Diagnosis not present

## 2019-03-09 DIAGNOSIS — L02415 Cutaneous abscess of right lower limb: Secondary | ICD-10-CM | POA: Diagnosis present

## 2019-03-09 DIAGNOSIS — Z7982 Long term (current) use of aspirin: Secondary | ICD-10-CM | POA: Insufficient documentation

## 2019-03-09 LAB — CBC WITH DIFFERENTIAL/PLATELET
Abs Immature Granulocytes: 0.16 10*3/uL — ABNORMAL HIGH (ref 0.00–0.07)
Basophils Absolute: 0 10*3/uL (ref 0.0–0.1)
Basophils Relative: 0 %
Eosinophils Absolute: 0 10*3/uL (ref 0.0–0.5)
Eosinophils Relative: 0 %
HCT: 41.3 % (ref 36.0–46.0)
Hemoglobin: 13.5 g/dL (ref 12.0–15.0)
Immature Granulocytes: 1 %
Lymphocytes Relative: 4 %
Lymphs Abs: 0.6 10*3/uL — ABNORMAL LOW (ref 0.7–4.0)
MCH: 31 pg (ref 26.0–34.0)
MCHC: 32.7 g/dL (ref 30.0–36.0)
MCV: 94.7 fL (ref 80.0–100.0)
Monocytes Absolute: 0.4 10*3/uL (ref 0.1–1.0)
Monocytes Relative: 3 %
Neutro Abs: 14 10*3/uL — ABNORMAL HIGH (ref 1.7–7.7)
Neutrophils Relative %: 92 %
Platelets: 201 10*3/uL (ref 150–400)
RBC: 4.36 MIL/uL (ref 3.87–5.11)
RDW: 15.2 % (ref 11.5–15.5)
WBC: 15.2 10*3/uL — ABNORMAL HIGH (ref 4.0–10.5)
nRBC: 0 % (ref 0.0–0.2)

## 2019-03-09 LAB — BASIC METABOLIC PANEL
Anion gap: 10 (ref 5–15)
BUN: 20 mg/dL (ref 8–23)
CO2: 21 mmol/L — ABNORMAL LOW (ref 22–32)
Calcium: 8.8 mg/dL — ABNORMAL LOW (ref 8.9–10.3)
Chloride: 104 mmol/L (ref 98–111)
Creatinine, Ser: 1.14 mg/dL — ABNORMAL HIGH (ref 0.44–1.00)
GFR calc Af Amer: 54 mL/min — ABNORMAL LOW (ref >60)
GFR calc non Af Amer: 47 mL/min — ABNORMAL LOW (ref >60)
Glucose, Bld: 150 mg/dL — ABNORMAL HIGH (ref 70–99)
Potassium: 3.7 mmol/L (ref 3.5–5.1)
Sodium: 135 mmol/L (ref 135–145)

## 2019-03-09 NOTE — ED Triage Notes (Signed)
Pt reports a sudden onset of right leg swelling, redness, fevers, and chills. Pt reports a hx of cellulitis.

## 2019-03-10 ENCOUNTER — Encounter (HOSPITAL_COMMUNITY): Payer: Self-pay | Admitting: Internal Medicine

## 2019-03-10 ENCOUNTER — Other Ambulatory Visit: Payer: Self-pay

## 2019-03-10 ENCOUNTER — Emergency Department (HOSPITAL_BASED_OUTPATIENT_CLINIC_OR_DEPARTMENT_OTHER): Payer: Medicare Other

## 2019-03-10 DIAGNOSIS — L03115 Cellulitis of right lower limb: Secondary | ICD-10-CM | POA: Diagnosis not present

## 2019-03-10 DIAGNOSIS — I82811 Embolism and thrombosis of superficial veins of right lower extremities: Secondary | ICD-10-CM | POA: Diagnosis present

## 2019-03-10 DIAGNOSIS — L538 Other specified erythematous conditions: Secondary | ICD-10-CM | POA: Diagnosis not present

## 2019-03-10 DIAGNOSIS — L0390 Cellulitis, unspecified: Secondary | ICD-10-CM | POA: Diagnosis not present

## 2019-03-10 DIAGNOSIS — R52 Pain, unspecified: Secondary | ICD-10-CM

## 2019-03-10 DIAGNOSIS — M7989 Other specified soft tissue disorders: Secondary | ICD-10-CM

## 2019-03-10 DIAGNOSIS — L02415 Cutaneous abscess of right lower limb: Secondary | ICD-10-CM | POA: Diagnosis not present

## 2019-03-10 LAB — CBC WITH DIFFERENTIAL/PLATELET
Abs Immature Granulocytes: 0.06 10*3/uL (ref 0.00–0.07)
Basophils Absolute: 0 10*3/uL (ref 0.0–0.1)
Basophils Relative: 0 %
Eosinophils Absolute: 0.1 10*3/uL (ref 0.0–0.5)
Eosinophils Relative: 1 %
HCT: 43.4 % (ref 36.0–46.0)
Hemoglobin: 14.1 g/dL (ref 12.0–15.0)
Immature Granulocytes: 1 %
Lymphocytes Relative: 3 %
Lymphs Abs: 0.4 10*3/uL — ABNORMAL LOW (ref 0.7–4.0)
MCH: 31.3 pg (ref 26.0–34.0)
MCHC: 32.5 g/dL (ref 30.0–36.0)
MCV: 96.4 fL (ref 80.0–100.0)
Monocytes Absolute: 0.5 10*3/uL (ref 0.1–1.0)
Monocytes Relative: 4 %
Neutro Abs: 9.8 10*3/uL — ABNORMAL HIGH (ref 1.7–7.7)
Neutrophils Relative %: 91 %
Platelets: 197 10*3/uL (ref 150–400)
RBC: 4.5 MIL/uL (ref 3.87–5.11)
RDW: 15.3 % (ref 11.5–15.5)
WBC: 10.8 10*3/uL — ABNORMAL HIGH (ref 4.0–10.5)
nRBC: 0 % (ref 0.0–0.2)

## 2019-03-10 LAB — LACTIC ACID, PLASMA
Lactic Acid, Venous: 1.1 mmol/L (ref 0.5–1.9)
Lactic Acid, Venous: 1 mmol/L (ref 0.5–1.9)

## 2019-03-10 LAB — BASIC METABOLIC PANEL
Anion gap: 14 (ref 5–15)
BUN: 18 mg/dL (ref 8–23)
CO2: 20 mmol/L — ABNORMAL LOW (ref 22–32)
Calcium: 9 mg/dL (ref 8.9–10.3)
Chloride: 104 mmol/L (ref 98–111)
Creatinine, Ser: 0.85 mg/dL (ref 0.44–1.00)
GFR calc Af Amer: >60 mL/min (ref >60)
GFR calc non Af Amer: >60 mL/min (ref >60)
Glucose, Bld: 107 mg/dL — ABNORMAL HIGH (ref 70–99)
Potassium: 3.4 mmol/L — ABNORMAL LOW (ref 3.5–5.1)
Sodium: 138 mmol/L (ref 135–145)

## 2019-03-10 LAB — SARS CORONAVIRUS 2 (TAT 6-24 HRS): SARS Coronavirus 2: NEGATIVE

## 2019-03-10 MED ORDER — LOSARTAN POTASSIUM 50 MG PO TABS
50.0000 mg | ORAL_TABLET | Freq: Every day | ORAL | Status: DC
  Administered 2019-03-11: 50 mg via ORAL
  Filled 2019-03-10: qty 1

## 2019-03-10 MED ORDER — ACETAMINOPHEN 500 MG PO TABS
1000.0000 mg | ORAL_TABLET | Freq: Once | ORAL | Status: AC
  Administered 2019-03-10: 1000 mg via ORAL
  Filled 2019-03-10: qty 2

## 2019-03-10 MED ORDER — LACTATED RINGERS IV SOLN
INTRAVENOUS | Status: AC
  Administered 2019-03-10: 15:00:00 via INTRAVENOUS

## 2019-03-10 MED ORDER — ONDANSETRON HCL 4 MG PO TABS: 4.0000 mg | ORAL_TABLET | Freq: Four times a day (QID) | ORAL | Status: DC | PRN

## 2019-03-10 MED ORDER — MESALAMINE ER 0.375 G PO CP24: 1.5000 g | ORAL_CAPSULE | Freq: Every day | ORAL | Status: DC

## 2019-03-10 MED ORDER — ASPIRIN EC 81 MG PO TBEC
81.0000 mg | DELAYED_RELEASE_TABLET | Freq: Every day | ORAL | Status: DC
  Administered 2019-03-10: 81 mg via ORAL
  Filled 2019-03-10: qty 1

## 2019-03-10 MED ORDER — ACETAMINOPHEN 325 MG PO TABS: 650.0000 mg | ORAL_TABLET | Freq: Four times a day (QID) | ORAL | Status: DC | PRN

## 2019-03-10 MED ORDER — DOCUSATE SODIUM 100 MG PO CAPS
100.0000 mg | ORAL_CAPSULE | Freq: Two times a day (BID) | ORAL | Status: DC
  Filled 2019-03-10 (×2): qty 1

## 2019-03-10 MED ORDER — ONDANSETRON HCL 4 MG/2ML IJ SOLN: 4.0000 mg | Freq: Four times a day (QID) | INTRAMUSCULAR | Status: DC | PRN

## 2019-03-10 MED ORDER — ACETAMINOPHEN 650 MG RE SUPP: 650.0000 mg | Freq: Four times a day (QID) | RECTAL | Status: DC | PRN

## 2019-03-10 MED ORDER — VANCOMYCIN HCL 10 G IV SOLR
2000.0000 mg | Freq: Once | INTRAVENOUS | Status: AC
  Administered 2019-03-10: 2000 mg via INTRAVENOUS
  Filled 2019-03-10: qty 2000

## 2019-03-10 MED ORDER — TOFACITINIB CITRATE 5 MG PO TABS
5.0000 mg | ORAL_TABLET | Freq: Two times a day (BID) | ORAL | Status: DC
  Administered 2019-03-10 – 2019-03-11 (×3): 5 mg via ORAL
  Filled 2019-03-10 (×2): qty 1

## 2019-03-10 MED ORDER — ATORVASTATIN CALCIUM 80 MG PO TABS
80.0000 mg | ORAL_TABLET | Freq: Every day | ORAL | Status: DC
  Administered 2019-03-10: 80 mg via ORAL
  Filled 2019-03-10: qty 1

## 2019-03-10 MED ORDER — ENOXAPARIN SODIUM 40 MG/0.4ML ~~LOC~~ SOLN: 40.0000 mg | SUBCUTANEOUS | Status: DC

## 2019-03-10 MED ORDER — METOPROLOL TARTRATE 25 MG PO TABS
25.0000 mg | ORAL_TABLET | Freq: Two times a day (BID) | ORAL | Status: DC
  Administered 2019-03-10 – 2019-03-11 (×2): 25 mg via ORAL
  Filled 2019-03-10 (×2): qty 1

## 2019-03-10 MED ORDER — VANCOMYCIN HCL IN DEXTROSE 1-5 GM/200ML-% IV SOLN
1000.0000 mg | INTRAVENOUS | Status: DC
  Filled 2019-03-10: qty 200

## 2019-03-10 MED ORDER — MESALAMINE ER 250 MG PO CPCR
1500.0000 mg | ORAL_CAPSULE | Freq: Every day | ORAL | Status: DC
  Administered 2019-03-10 – 2019-03-11 (×2): 1500 mg via ORAL
  Filled 2019-03-10 (×2): qty 6

## 2019-03-10 NOTE — Progress Notes (Signed)
Pharmacy Antibiotic Note  Isabella [name] is a 78 y.o. female admitted on 03/09/2019 with cellulitis.  Pharmacy has been consulted for vancomycin dosing. Pt is afebrile and WBC is minimally elevated. Lactic acid is normal.   Plan: Vancomycin 2gm IV x 1 then 1gm IV Q24H F/u renal fxn, C&S, clinical status and peak/trough at SS  Height: 5' 1"  (154.9 cm) Weight: 208 lb (94.3 kg) IBW/kg (Calculated) : 47.8  Temp (24hrs), Avg:98.6 F (37 C), Min:98.4 F (36.9 C), Max:98.8 F (37.1 C)  Recent Labs  Lab 03/09/19 1157 03/10/19 0835  WBC 15.2* 10.8*  CREATININE 1.14* 0.85  LATICACIDVEN  --  1.1    Estimated Creatinine Clearance: 59 mL/min (by C-G formula based on SCr of 0.85 mg/dL).    Allergies  Allergen Reactions  . Codeine Shortness Of Breath and Other (See Comments)    Made back hurt; does not affect liver per patient but has has caused difficulty breathing/tolerates Percocet only in small doses Back hurts does not affect liver per patient but has has caused difficulty breathing but tolerates Percocet only at small doses  . Imodium A-D [Loperamide Hcl] Shortness Of Breath and Other (See Comments)    Caused a pain & "squeezing" sensation in the lungs also  . Neomycin Swelling and Other (See Comments)    Reaction to eye ointment - eyes became swollen shut Makes wounds ooze Reaction to eye ointment - eyes swelled shut Makes wounds ooze OTHER-eye drops-swelling of eye, redness  . Penicillinase Hives, Shortness Of Breath and Rash    Has patient had a PCN reaction causing immediate rash, facial/tongue/throat swelling, SOB or lightheadedness with hypotension: Yes Has patient had a PCN reaction causing severe rash involving mucus membranes or skin necrosis: No Has patient had a PCN reaction that required hospitalization:No Has patient had a PCN reaction occurring within the last 10 years: No If all of the above answers are "NO", then may proceed with Cephalosporin use.   Marland Kitchen  Penicillins Hives, Shortness Of Breath and Rash    Reaction @ 18-yrs old Has patient had a PCN reaction causing immediate rash, facial/tongue/throat swelling, SOB or lightheadedness with hypotension: Yes Has patient had a PCN reaction causing severe rash involving mucus membranes or skin necrosis: No Has patient had a PCN reaction that required hospitalization: No Has patient had a PCN reaction occurring within the last 10 years: No If all of the above answers are "NO", then may proceed with Cephalosporin use.  . Tramadol Shortness Of Breath and Other (See Comments)    (Per patient) "felt like lungs were being squeezed"   . Lisinopril Cough  . Hornet Venom Swelling    Swelling at site of sting  . Adhesive [Tape] Rash  . Erythromycin Diarrhea  . Hydromorphone Other (See Comments)    Severe Lethargy    . Percocet [Oxycodone-Acetaminophen] Other (See Comments)    Confusion     Antimicrobials this admission: Vanc 10/28>>  Dose adjustments this admission: N/A  Microbiology results: Pending  Thank you for allowing pharmacy to be a part of this patient's care.  Ziad Maye, Rande Lawman 03/10/2019 10:37 AM

## 2019-03-10 NOTE — ED Provider Notes (Signed)
[facility] EMERGENCY DEPARTMENT Provider Note   CSN: [id number] Arrival date & time: 03/09/19  1142     History   Chief Complaint Chief Complaint  Patient presents with  . Recurrent Skin Infections    HPI Isabella [name] is a 78 y.o. female.     78yo F w/ PMH below including CAD, granulomatous lung disease, HTN, HLD, CHF, OSA who p/w R leg swelling. On 10/27 around 2 am (~24 hours ago), she woke up with pain in her R leg. Since then she's had progressively worsening redness and swelling of her lower leg to thigh. She denies trauma/injury. She reports an episode of chills followed by T99.5. She has not taken any medications for symptoms. No new cough, vomiting, or diarrhea. She has h/o cellulitis in her leg. No recent travel or h/o blood clots. She reports decreased urination over past 24 hours which is unusual for her. No dysuria/hematuria.   The history is provided by the patient.    Past Medical History:  Diagnosis Date  . Arthritis   . Complication of anesthesia    Took a while to wake up with knee  . Coronary artery disease   . Dyspnea    with walking at times  . Fallen bladder   . GERD (gastroesophageal reflux disease)   . Granulomatous lung disease ([place])   . 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 Headache    migraines in her younger years  . Hemorrhoids   . High cholesterol   . History of blood transfusion   . Hx of adenomatous colonic polyps   . Hypertension   . Lung nodule   . Lymphedema    BLE  . Myocardial infarct ([place]) 03/24/2011   Dr. [name]  . Nocturnal leg cramps   . Obesity   . Sarcoidosis   . Sleep apnea    uses cpap    Patient Active Problem List   Diagnosis Date Noted  . Lymphedema 01/29/2018  . NSTEMI (non-ST elevated myocardial infarction) ([place]) 01/27/2018  . Malnutrition of moderate degree 10/29/2017  . Cellulitis 10/27/2017  . Acute diastolic CHF (congestive heart failure) ([facility])  10/15/2017  . Acute on chronic colitis 09/29/2017  . GERD (gastroesophageal reflux disease) 09/29/2017  . Degenerative spondylolisthesis 02/01/2014  . Sarcoidosis 07/14/2013  . Abnormal CT of the chest 04/21/2013  . Coronary atherosclerosis of native coronary artery 03/29/2013    Class: Chronic  . Old MI (myocardial infarction) 03/29/2013    Class: Chronic  . OA (osteoarthritis) of knee 06/17/2012  . Lung nodule 06/03/2012  . DJD (degenerative joint disease) 06/01/2012  . 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 Edema of lower extremity 03/29/2011  . Anemia associated with acute blood loss 03/25/2011  . Hematoma following PCI 03/24/2011  . Obesity (BMI 30-39.9)   . Obstructive sleep apnea 01/25/2009  . Hyperlipidemia 01/24/2009  . Essential hypertension 01/24/2009    Past Surgical History:  Procedure Laterality Date  . ABDOMINAL HYSTERECTOMY  1990  . APPENDECTOMY  1970  . BACK SURGERY  2015   lower back  . CARDIAC CATHETERIZATION  03/2011  . CARPAL TUNNEL RELEASE Bilateral 2004  . CATARACT EXTRACTION    . CHOLECYSTECTOMY  1970  . COLONOSCOPY W/ BIOPSIES AND POLYPECTOMY    . CORONARY ANGIOPLASTY  2012  . CORONARY STENT PLACEMENT  03/24/2011   Dr. [name]  . EYE SURGERY  2008,2011   corneal transplant  . FEMORAL  ARTERY EXPLORATION  03/25/2011   Procedure: FEMORAL ARTERY EXPLORATION;  Surgeon: Elam Dutch, MD;  Location: [facility] OR;  Service: Vascular;  Laterality: Right;  Evacuation of Hematoma   . hematoma surgery     from cardiac cath -hematoma in groin-right  . HERNIA REPAIR  0211   umbilical  . JOINT REPLACEMENT    . KNEE ARTHROSCOPY  2006   Right  . [place] Surgery Left Eye  2012  . LEFT HEART CATH AND CORONARY ANGIOGRAPHY N/A 01/27/2018   Procedure: LEFT HEART CATH AND CORONARY ANGIOGRAPHY;  Surgeon: Belva Crome, MD;  Location: [facility] LAB;  Service: Cardiovascular;  Laterality: N/A;  . LEFT HEART CATHETERIZATION WITH CORONARY ANGIOGRAM N/A 03/24/2011    Procedure: LEFT HEART CATHETERIZATION WITH CORONARY ANGIOGRAM;  Surgeon: Sinclair Grooms, MD;  Location: [facility] CATH LAB;  Service: Cardiovascular;  Laterality: N/A;  . PERCUTANEOUS CORONARY STENT INTERVENTION (PCI-S) N/A 03/24/2011   Procedure: PERCUTANEOUS CORONARY STENT INTERVENTION (PCI-S);  Surgeon: Sinclair Grooms, MD;  Location: [facility] CATH LAB;  Service: Cardiovascular;  Laterality: N/A;  . SHOULDER SURGERY  2010  . TONSILLECTOMY  1949  . TONSILLECTOMY AND ADENOIDECTOMY    . TOTAL KNEE ARTHROPLASTY  07/2010   Left  . TOTAL KNEE ARTHROPLASTY  06/17/2012   Procedure: TOTAL KNEE ARTHROPLASTY;  Surgeon: Gearlean Alf, MD;  Location: [facility] ORS;  Service: Orthopedics;  Laterality: Right;  . TUBAL LIGATION  1978  . [place], 2007   x 2  . VIDEO BRONCHOSCOPY WITH ENDOBRONCHIAL NAVIGATION  04/13/2012   Procedure: VIDEO BRONCHOSCOPY WITH ENDOBRONCHIAL NAVIGATION;  Surgeon: Melrose Nakayama, MD;  Location: [facility];  Service: Thoracic;  Laterality: N/A;  . VIDEO BRONCHOSCOPY WITH ENDOBRONCHIAL NAVIGATION N/A 04/08/2016   Procedure: VIDEO BRONCHOSCOPY WITH ENDOBRONCHIAL NAVIGATION;  Surgeon: Rigoberto Noel, MD;  Location: [facility];  Service: Thoracic;  Laterality: N/A;     OB History   No obstetric history on file.      Home Medications    Prior to Admission medications   Medication Sig Start Date End Date Taking? Authorizing Provider  acetaminophen (TYLENOL) 500 MG tablet Take 500 mg by mouth every 6 (six) hours as needed for mild pain.   Yes [provider]  aspirin EC 81 MG tablet Take 81 mg by mouth daily at 3 pm.   Yes [provider]  atorvastatin (LIPITOR) 80 MG tablet TAKE 1 TABLET(80 MG) BY MOUTH DAILY Patient taking differently: Take 80 mg by mouth daily at 6 PM.  02/18/19  Yes Kathyrn Drown D, NP  chlorpheniramine (CHLOR-TRIMETON) 4 MG tablet Take 8 mg by mouth at bedtime.    Yes [provider]  Cholecalciferol (VITAMIN D) 50 MCG (2000 UT)  CAPS Take 2,000 Units by mouth daily.   Yes [provider]  furosemide (LASIX) 40 MG tablet Take 40 mg by mouth daily.   Yes [provider]  losartan (COZAAR) 50 MG tablet Take 1 tablet (50 mg total) by mouth daily. 04/13/18  Yes Belva Crome, MD  mesalamine (APRISO) 0.375 g 24 hr capsule Take 1,500 mg by mouth daily.   Yes [provider]  metoprolol tartrate (LOPRESSOR) 25 MG tablet Take 25 mg by mouth 2 (two) times daily.    Yes [provider]  Multiple Vitamins-Minerals (CENTRUM SILVER 50+WOMEN) TABS Take 1 tablet by mouth daily with supper.    Yes [provider]  nitroGLYCERIN (NITROSTAT) 0.4 MG SL tablet  Place 1 tablet (0.4 mg total) under the tongue every 5 (five) minutes as needed for chest pain. 12/12/17  Yes Imogene Burn, PA-C  potassium chloride SA (K-DUR,KLOR-CON) 20 MEQ tablet Take 20 mEq by mouth daily with supper.    Yes [provider]  Tofacitinib Citrate 5 MG TABS Take 5 mg by mouth 2 (two) times daily. TAKING XELJANZ BRAND NAME   Yes [provider]  UNABLE TO FIND CPAP: At bedtime nightly and during all naps   Yes [provider]    Family History Family History  Problem Relation Age of Onset  . Hypertension Mother   . Cancer Father   . Breast cancer Maternal Grandmother     Social History Social History   Tobacco Use  . Smoking status: Never Smoker  . Smokeless tobacco: Never Used  Substance Use Topics  . Alcohol use: No  . Drug use: No     Allergies   Codeine, Imodium a-d [loperamide hcl], Neomycin, Penicillinase, Penicillins, Tramadol, Lisinopril, Hornet venom, Adhesive [tape], Erythromycin, Hydromorphone, and Percocet [oxycodone-acetaminophen]   Review of Systems Review of Systems All other systems reviewed and are negative except that which was mentioned in HPI   Physical Exam Updated Vital Signs BP 140/69 (BP Location: Left Arm)   Pulse 74   Temp 98.8 F (37.1 C)  (Oral)   Resp 20   Ht 5' 1"  (1.549 m)   Wt 94.3 kg   LMP  (Exact Date)   SpO2 99%   BMI 39.30 kg/m   Physical Exam Vitals signs and nursing note reviewed.  Constitutional:      General: She is not in acute distress.    Appearance: She is well-developed.  HENT:     Head: Normocephalic and atraumatic.  Eyes:     Conjunctiva/sclera: Conjunctivae normal.     Pupils: Pupils are equal, round, and reactive to light.  Neck:     Musculoskeletal: Neck supple.  Cardiovascular:     Rate and Rhythm: Normal rate and regular rhythm.     Heart sounds: Normal heart sounds. No murmur.  Pulmonary:     Effort: Pulmonary effort is normal.     Breath sounds: Normal breath sounds.  Abdominal:     General: Bowel sounds are normal. There is no distension.     Palpations: Abdomen is soft.     Tenderness: There is no abdominal tenderness.  Musculoskeletal:     Right lower leg: Edema present.     Left lower leg: Edema present.     Comments: Pitting edema BLE R>L; generalized tenderness of R lower leg to mid-thigh without crepitus  Skin:    General: Skin is warm and dry.     Findings: Erythema and rash present.     Comments: Erythema extending from ankle up to mid thigh with some petechiae on posterior lower leg  Neurological:     Mental Status: She is alert and oriented to person, place, and time.     Comments: Fluent speech  Psychiatric:        Judgment: Judgment normal.      ED Treatments / Results  Labs (all labs ordered are listed, but only abnormal results are displayed) Labs Reviewed  CBC WITH DIFFERENTIAL/PLATELET - Abnormal; Notable for the following components:      Result Value   WBC 15.2 (*)    Neutro Abs 14.0 (*)    Lymphs Abs 0.6 (*)    Abs Immature Granulocytes 0.16 (*)  All other components within normal limits  BASIC METABOLIC PANEL - Abnormal; Notable for the following components:   CO2 21 (*)    Glucose, Bld 150 (*)    Creatinine, Ser 1.14 (*)    Calcium 8.8 (*)     GFR calc non Af Amer 47 (*)    GFR calc Af Amer 54 (*)    All other components within normal limits  CBC WITH DIFFERENTIAL/PLATELET - Abnormal; Notable for the following components:   WBC 10.8 (*)    Neutro Abs 9.8 (*)    Lymphs Abs 0.4 (*)    All other components within normal limits  BASIC METABOLIC PANEL - Abnormal; Notable for the following components:   Potassium 3.4 (*)    CO2 20 (*)    Glucose, Bld 107 (*)    All other components within normal limits  CULTURE, BLOOD (ROUTINE X 2)  CULTURE, BLOOD (ROUTINE X 2)  SARS CORONAVIRUS 2 (TAT 6-24 HRS)  LACTIC ACID, PLASMA  LACTIC ACID, PLASMA  URINALYSIS, ROUTINE W REFLEX MICROSCOPIC    EKG None  Radiology Vas [place] Lower Extremity Venous (dvt) (only Mc & Wl 7a-7p)  Result Date: 03/10/2019  Lower Venous Study Indications: Pain, Swelling, and Erythema. Cellulitis.  Limitations: Edema. Comparison Study: Prior study from 05/21/17 is available for comparison Performing Technologist: Sharion Dove RVS  Examination Guidelines: A complete evaluation includes B-mode imaging, spectral Doppler, color Doppler, and power Doppler as needed of all accessible portions of each vessel. Bilateral testing is considered an integral part of a complete examination. Limited examinations for reoccurring indications may be performed as noted.  +---------+---------------+---------+-----------+----------+---------------+ RIGHT    CompressibilityPhasicitySpontaneityPropertiesThrombus Aging  +---------+---------------+---------+-----------+----------+---------------+ CFV      Full           Yes      Yes                                  +---------+---------------+---------+-----------+----------+---------------+ SFJ      Full                                                         +---------+---------------+---------+-----------+----------+---------------+ FV Prox  Full                                                          +---------+---------------+---------+-----------+----------+---------------+ FV Mid   Full                                                         +---------+---------------+---------+-----------+----------+---------------+ FV DistalFull                                                         +---------+---------------+---------+-----------+----------+---------------+ PFV      Full                                                         +---------+---------------+---------+-----------+----------+---------------+  POP      Full                                                         +---------+---------------+---------+-----------+----------+---------------+ PTV                                                   patent by color +---------+---------------+---------+-----------+----------+---------------+ PERO                                                  Not visualized  +---------+---------------+---------+-----------+----------+---------------+ HSV      Full                                                         +---------+---------------+---------+-----------+----------+---------------+ SSV      None                                         Acute           +---------+---------------+---------+-----------+----------+---------------+   Right Technical Findings: Not visualized segments include peroneal vein.  +----+---------------+---------+-----------+----------+--------------+ LEFTCompressibilityPhasicitySpontaneityPropertiesThrombus Aging +----+---------------+---------+-----------+----------+--------------+ CFV Full           Yes      Yes                                 +----+---------------+---------+-----------+----------+--------------+     Summary: Right: Findings consistent with acute superficial vein thrombosis involving the right small saphenous vein. There is no evidence of deep vein thrombosis in the lower extremity. However, portions  of this examination were limited- see technologist comments  above. Acute superficial thrombosis noted in the small saphenous vein. Left: No evidence of common femoral vein obstruction.  *See table(s) above for measurements and observations.    Preliminary     Procedures Procedures (including critical care time)  Medications Ordered in ED Medications - No data to display   Initial Impression / Assessment and Plan / ED Course  I have reviewed the triage vital signs and the nursing notes.  Pertinent labs & imaging results that were available during my care of the patient were reviewed by me and considered in my medical decision making (see chart for details).       Non-toxic on exam, afebrile. Concern for progressing cellulitis based on visual exam. Obtained US to r/o DVT given asymmetric edema.  Ultrasound negative for DVT, shows superficial vein thrombosis of right saphenous.  Initial lab work obtained from waiting room shows WBC 15.2, creatinine 1.1.  Repeat lab work several hours later shows improved creatinine and WBC 10.8.  Her lactate is normal and given reassuring vital signs, I doubt sepsis.  I am concerned about the rapid spread of her erythema up  to her thigh given that it started only 24 hours ago.  Because of her comorbidities as well as her fall risk I recommended admission for IV antibiotics.  Gave a dose of vancomycin and discussed admission with [facility], Dr. Lorin Mercy, and patient admitted for further care.  Final Clinical Impressions(s) / ED Diagnoses   Final diagnoses:  Cellulitis of right leg    ED Discharge Orders    None       Delcia Spitzley, Wenda Overland, MD 03/10/19 1054

## 2019-03-10 NOTE — Progress Notes (Signed)
VASCULAR LAB PRELIMINARY  PRELIMINARY  PRELIMINARY  PRELIMINARY  Right lower extremity venous duplex completed.    Preliminary report:  See CV proc for preliminary results.   Tully Mcinturff, RVT 03/10/2019, 9:06 AM

## 2019-03-10 NOTE — Plan of Care (Signed)

## 2019-03-10 NOTE — ED Notes (Signed)
Report given to [facility], RN

## 2019-03-10 NOTE — Progress Notes (Signed)
Talked with patient about CPAP usage.  She uses one at home, but felt that she was only going to be here one night and did not feel the need to use one for just one night.  She did state that if she has to be at hospital longer, she would have her son bring her machine from home.  No distress noted, will continue to monitor.

## 2019-03-10 NOTE — H&P (Signed)
History and Physical    TAL NEER ITG:[id number] DOB: 1940/09/02 DOA: 03/09/2019  PCP: Glenis Smoker, MD Consultants:  [name] - cardiology; [facility]; [place]ack - ophthalmology; Elsworth Soho - pulmonology Patient coming from:  Home - lives with son; NOK: Daughter, Otila Kluver, [phone]  Chief Complaint: Skin infection  HPI: Isabella [name] is a 78 y.o. female with medical history significant of OSA on CPAP; sarcoidosis; obesity; CAD; BLE lymphedema; HTN; Fuch's corneal dystrophy; and HLD presenting with recurrent skin infection.  Her "leg messed up."  It's got cellulitis and also a little superficial blood clot.  She started with symptoms night before last - she started having chills that day and noticed the prior night that her leg was a little pink and felt hot.  She called the doctor that day and she recommended drive-thru COVID and flu testing.  She also noted low-grade fever.  She came to the ER yesterday morning and has been in the waiting room since.  It was ok at rest but would hurt when someone touched it.  Yesterday AM, Tmax 100.  She has had cellulitis of that leg before - had oozing blisters at that time in 6/19. (Also hospitalized in 5/19 with UC and 9/19 with MI).    ED Course:   Waiting room for 20 hours.  Yesterday AM with acute onset RLE with redness, markedly worsened.  Leg with erythema all the way to her thigh.  No COVID symptoms.  Subjective fever, chills.  H/o RLE cellulitis.  Negative for DVT.  WBC 15.2, Creatinine minimally elevated at presentation but corrected.  Normal lactate.  Given Vanc.  No suspicion for gangrene, nec fasc.  Review of Systems: As per HPI; otherwise review of systems reviewed and negative.   Ambulatory Status:  Ambulates with a cane  Past Medical History:  Diagnosis Date  . Arthritis   . Complication of anesthesia    Took a while to wake up with knee  . Coronary artery disease    MI 2012, 2019, Dr. [name]  . Dyspnea    with  walking at times  . Fallen bladder   . GERD (gastroesophageal reflux disease)   . 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 Headache    migraines in her younger years  . Hemorrhoids   . High cholesterol   . History of blood transfusion   . Hx of adenomatous colonic polyps   . Hypertension   . Lung nodule   . Lymphedema    BLE  . Nocturnal leg cramps   . Obesity   . Sleep apnea    uses cpap    Past Surgical History:  Procedure Laterality Date  . ABDOMINAL HYSTERECTOMY  1990  . APPENDECTOMY  1970  . BACK SURGERY  2015   lower back  . CARDIAC CATHETERIZATION  03/2011  . CARPAL TUNNEL RELEASE Bilateral 2004  . CATARACT EXTRACTION    . CHOLECYSTECTOMY  1970  . COLONOSCOPY W/ BIOPSIES AND POLYPECTOMY    . CORONARY ANGIOPLASTY  2012  . CORONARY STENT PLACEMENT  03/24/2011   Dr. [name]  . EYE SURGERY  2008,2011   corneal transplant  . FEMORAL ARTERY EXPLORATION  03/25/2011   Procedure: FEMORAL ARTERY EXPLORATION;  Surgeon: Elam Dutch, MD;  Location: [place];  Service: Vascular;  Laterality: Right;  Evacuation of Hematoma   . hematoma surgery  from cardiac cath -hematoma in groin-right  . HERNIA REPAIR  3491   umbilical  . JOINT REPLACEMENT    . KNEE ARTHROSCOPY  2006   Right  . [place] Surgery Left Eye  2012  . LEFT HEART CATH AND CORONARY ANGIOGRAPHY N/A 01/27/2018   Procedure: LEFT HEART CATH AND CORONARY ANGIOGRAPHY;  Surgeon: Belva Crome, MD;  Location: [place] [facility];  Service: Cardiovascular;  Laterality: N/A;  . LEFT HEART CATHETERIZATION WITH CORONARY ANGIOGRAM N/A 03/24/2011   Procedure: LEFT HEART CATHETERIZATION WITH CORONARY ANGIOGRAM;  Surgeon: Sinclair Grooms, MD;  Location[facility] CATH [facility];  Service: Cardiovascular;  Laterality: N/A;  . PERCUTANEOUS CORONARY STENT INTERVENTION (PCI-S) N/A 03/24/2011   Procedure: PERCUTANEOUS CORONARY STENT INTERVENTION (PCI-S);   Surgeon: Sinclair Grooms, MD;  Location: [facility] CATH LAB;  Service: Cardiovascular;  Laterality: N/A;  . SHOULDER SURGERY  2010  . TONSILLECTOMY  1949  . TONSILLECTOMY AND ADENOIDECTOMY    . TOTAL KNEE ARTHROPLASTY  07/2010   Left  . TOTAL KNEE ARTHROPLASTY  06/17/2012   Procedure: TOTAL KNEE ARTHROPLASTY;  Surgeon: Gearlean Alf, MD;  Location: [facility];  Service: Orthopedics;  Laterality: Right;  . TUBAL LIGATION  1978  . [place], 2007   x 2  . VIDEO BRONCHOSCOPY WITH ENDOBRONCHIAL NAVIGATION  04/13/2012   Procedure: VIDEO BRONCHOSCOPY WITH ENDOBRONCHIAL NAVIGATION;  Surgeon: Melrose Nakayama, MD;  Location: [place];  Service: Thoracic;  Laterality: N/A;  . VIDEO BRONCHOSCOPY WITH ENDOBRONCHIAL NAVIGATION N/A 04/08/2016   Procedure: VIDEO BRONCHOSCOPY WITH ENDOBRONCHIAL NAVIGATION;  Surgeon: Rigoberto Noel, MD;  Location: [place];  Service: Thoracic;  Laterality: N/A;    Social History   Socioeconomic History  . Marital status: Widowed    Spouse name: Not on file  . Number of children: Not on file  . Years of education: Not on file  . Highest education level: Not on file  Occupational History  . Occupation: Retired    Fish farm manager: RETIRED  Social Needs  . Financial resource strain: Not on file  . Food insecurity    Worry: Not on file    Inability: Not on file  . Transportation needs    Medical: Not on file    Non-medical: Not on file  Tobacco Use  . Smoking status: Never Smoker  . Smokeless tobacco: Never Used  Substance and Sexual Activity  . Alcohol use: No  . Drug use: No  . Sexual activity: Not on file  Lifestyle  . Physical activity    Days per week: Not on file    Minutes per session: Not on file  . Stress: Not on file  Relationships  . Social Herbalist on phone: Not on file    Gets together: Not on file    Attends religious service: Not on file    Active member of club or organization: Not on file    Attends meetings of clubs or  organizations: Not on file    Relationship status: Not on file  . Intimate partner violence    Fear of current or ex partner: Not on file    Emotionally abused: Not on file    Physically abused: Not on file    Forced sexual activity: Not on file  Other Topics Concern  . Not on file  Social History Narrative   Tobacco use cigarettes: never smoked, tobacco history last updated 12/23/2013. No smoking. No alcohol. Caffeine: yes, coffee,  3 servings daily.recreational drug use: never. No diet. Exercise: walks everyday. Occupation: retired. Marital status: married. Children: boys, 2 girls, 2. Seat belt use: yes.    Allergies  Allergen Reactions  . Codeine Shortness Of Breath and Other (See Comments)    Made back hurt; does not affect liver per patient but has has caused difficulty breathing/tolerates Percocet only in small doses Back hurts does not affect liver per patient but has has caused difficulty breathing but tolerates Percocet only at small doses  . Imodium A-D [Loperamide Hcl] Shortness Of Breath and Other (See Comments)    Caused a pain & "squeezing" sensation in the lungs also  . Neomycin Swelling and Other (See Comments)    Reaction to eye ointment - eyes became swollen shut Makes wounds ooze Reaction to eye ointment - eyes swelled shut Makes wounds ooze OTHER-eye drops-swelling of eye, redness  . Penicillinase Hives, Shortness Of Breath and Rash    Has patient had a PCN reaction causing immediate rash, facial/tongue/throat swelling, SOB or lightheadedness with hypotension: Yes Has patient had a PCN reaction causing severe rash involving mucus membranes or skin necrosis: No Has patient had a PCN reaction that required hospitalization:No Has patient had a PCN reaction occurring within the last 10 years: No If all of the above answers are "NO", then may proceed with Cephalosporin use.   Marland Kitchen Penicillins Hives, Shortness Of Breath and Rash    Reaction @ 18-yrs old Has patient had a  PCN reaction causing immediate rash, facial/tongue/throat swelling, SOB or lightheadedness with hypotension: Yes Has patient had a PCN reaction causing severe rash involving mucus membranes or skin necrosis: No Has patient had a PCN reaction that required hospitalization: No Has patient had a PCN reaction occurring within the last 10 years: No If all of the above answers are "NO", then may proceed with Cephalosporin use.  . Tramadol Shortness Of Breath and Other (See Comments)    (Per patient) "felt like lungs were being squeezed"   . Lisinopril Cough  . [place] Venom Swelling    Swelling at site of sting  . Adhesive [Tape] Rash  . Erythromycin Diarrhea  . Hydromorphone Other (See Comments)    Severe Lethargy    . Percocet [Oxycodone-Acetaminophen] Other (See Comments)    Confusion     Family History  Problem Relation Age of Onset  . Hypertension Mother   . Cancer Father   . Breast cancer Maternal Grandmother     Prior to Admission medications   Medication Sig Start Date End Date Taking? Authorizing Provider  acetaminophen (TYLENOL) 500 MG tablet Take 500 mg by mouth every 6 (six) hours as needed for mild pain.   Yes [provider]  aspirin EC 81 MG tablet Take 81 mg by mouth daily at 3 pm.   Yes [provider]  atorvastatin (LIPITOR) 80 MG tablet TAKE 1 TABLET(80 MG) BY MOUTH DAILY Patient taking differently: Take 80 mg by mouth daily at 6 PM.  02/18/19  Yes Kathyrn Drown D, NP  chlorpheniramine (CHLOR-TRIMETON) 4 MG tablet Take 8 mg by mouth at bedtime.    Yes [provider]  Cholecalciferol (VITAMIN D) 50 MCG (2000 UT) CAPS Take 2,000 Units by mouth daily.   Yes [provider]  furosemide (LASIX) 40 MG tablet Take 40 mg by mouth daily.   Yes [provider]  losartan (COZAAR) 50 MG tablet Take 1 tablet (50 mg total) by mouth daily. 04/13/18  Yes Belva Crome, MD  mesalamine (APRISO) 0.375 g 24 hr capsule Take 1,500 mg by mouth  daily.   Yes [provider]  metoprolol tartrate (LOPRESSOR) 25 MG tablet Take 25 mg by mouth 2 (two) times daily.    Yes [provider]  Multiple Vitamins-Minerals (CENTRUM SILVER 50+WOMEN) TABS Take 1 tablet by mouth daily with supper.    Yes [provider]  nitroGLYCERIN (NITROSTAT) 0.4 MG SL tablet Place 1 tablet (0.4 mg total) under the tongue every 5 (five) minutes as needed for chest pain. 12/12/17  Yes Imogene Burn, PA-C  potassium chloride SA (K-DUR,KLOR-CON) 20 MEQ tablet Take 20 mEq by mouth daily with supper.    Yes [provider]  Tofacitinib Citrate 5 MG TABS Take 5 mg by mouth 2 (two) times daily. TAKING XELJANZ BRAND NAME   Yes [provider]  UNABLE TO FIND CPAP: At bedtime nightly and during all naps   Yes [provider]    Physical Exam: Vitals:   03/10/19 0900 03/10/19 1042 03/10/19 1100 03/10/19 1200  BP: (!) 145/62 (!) 168/81 (!) 167/78 (!) 145/77  Pulse: 72 74 60 77  Resp:      Temp:      TempSrc:      SpO2: 100% 100% 100% 100%  Weight:      Height:         . General:  Appears calm and comfortable and is NAD . Eyes:  PERRL, EOMI, normal lids, iris . ENT:  grossly normal hearing, lips & tongue, mmm . Neck:  no LAD, masses or thyromegaly . Cardiovascular:  RRR, no m/r/g.  . Respiratory:   CTA bilaterally with no wheezes/rales/rhonchi.  Normal respiratory effort. . Abdomen:  soft, NT, ND, NABS . Back:   normal alignment, no CVAT . Skin:  RLE with marked lower leg erythema extending up to the distal thigh and streaking into the groin.  Small superficial ulcerations on the anterior first picture) and posterior (second picture) lower leg.        . Musculoskeletal:  grossly normal tone BUE/BLE, good ROM, no bony abnormality, chronic B lymphedema . Psychiatric:  grossly normal mood and affect, speech fluent and appropriate, AOx3 . Neurologic:  CN 2-12 grossly intact, moves all extremities in  coordinated fashion, sensation intact    Radiological Exams on Admission: Vas [place] Lower Extremity Venous (dvt) (only Mc & Wl 7a-7p)  Result Date: 03/10/2019  Lower Venous Study Indications: Pain, Swelling, and Erythema. Cellulitis.  Limitations: Edema. Comparison Study: Prior study from 05/21/17 is available for comparison, patient                   had acute SVT of right SSV. Performing Technologist: Sharion Dove RVS  Examination Guidelines: A complete evaluation includes B-mode imaging, spectral Doppler, color Doppler, and power Doppler as needed of all accessible portions of each vessel. Bilateral testing is considered an integral part of a complete examination. Limited examinations for reoccurring indications may be performed as noted.  +---------+---------------+---------+-----------+----------+---------------+ RIGHT    CompressibilityPhasicitySpontaneityPropertiesThrombus Aging  +---------+---------------+---------+-----------+----------+---------------+ CFV      Full           Yes      Yes                                  +---------+---------------+---------+-----------+----------+---------------+ SFJ      Full                                                         +---------+---------------+---------+-----------+----------+---------------+  FV Prox  Full                                                         +---------+---------------+---------+-----------+----------+---------------+ FV Mid   Full                                                         +---------+---------------+---------+-----------+----------+---------------+ FV DistalFull                                                         +---------+---------------+---------+-----------+----------+---------------+ PFV      Full                                                         +---------+---------------+---------+-----------+----------+---------------+ POP      Full                                                          +---------+---------------+---------+-----------+----------+---------------+ PTV                                                   patent by color +---------+---------------+---------+-----------+----------+---------------+ PERO                                                  Not visualized  +---------+---------------+---------+-----------+----------+---------------+ HSV      Full                                                         +---------+---------------+---------+-----------+----------+---------------+ SSV      None                                         Acute           +---------+---------------+---------+-----------+----------+---------------+   Right Technical Findings: Not visualized segments include peroneal vein.  +----+---------------+---------+-----------+----------+--------------+ LEFTCompressibilityPhasicitySpontaneityPropertiesThrombus Aging +----+---------------+---------+-----------+----------+--------------+ CFV Full           Yes      Yes                                 +----+---------------+---------+-----------+----------+--------------+  Summary: Right: Findings consistent with acute superficial vein thrombosis involving the right small saphenous vein. There is no evidence of deep vein thrombosis in the lower extremity. However, portions of this examination were limited- see technologist comments  above. Acute superficial thrombosis noted in the small saphenous vein. Left: No evidence of common femoral vein obstruction.  *See table(s) above for measurements and observations. Electronically signed by Harold Barban MD on 03/10/2019 at 1:12:01 PM.    Final     EKG: not done   Labs on Admission: I have personally reviewed the available labs and imaging studies at the time of the admission.  Pertinent labs:   K+ 3.4 CO2 20  Lactate 1.1 WBC 10.8 Glucose 107  Assessment/Plan Principal  Problem:   Cellulitis and abscess of right leg Active Problems:   Hyperlipidemia   Obstructive sleep apnea   Essential hypertension   Obesity (BMI 30-39.9)   Coronary atherosclerosis of native coronary artery   Sarcoidosis   Thrombosis of right saphenous vein   Cellulitis, RLE -Patient with h/o cellulitis in this same region in the past -Currently with erythema to entire lower leg and extending onto the thigh with streaking into the medial groin -Careful inspection of the skin shows minimal ulcerations -Normal WBC count -She was given Vanc in the ER, will continue -If improved tomorrow, she likely can be discharged; if not, she will likely need broadening of her antibiotics   Superficial thrombosis -DVT US with acute superficial clot -By current standards, this does not appear to require AC -Will start DVT prevention Lovenox at this time  CAD -Continue ASA, ARB, BB, statin  Sarcoidosis -Continue Morrie Sheldon -If cellulitis is not improving, could hold  UC -Continue Mesalamine  HTN -Continue Cozaar, Lopressor  HLD -Continue Lipitor  Obesity -BMI 39.30 -Weight loss should be encouraged -Outpatient PCP/bariatric medicine/bariatric surgery f/u encouraged  OSA -Continue CPAP   Note: This patient has been tested and is pending for the novel coronavirus COVID-19.  DVT prophylaxis: Lovenox Code Status:  Full - confirmed with patient Family Communication: None present  Disposition Plan:  Home once clinically improved Consults called: None  Admission status: It is my clinical opinion that referral for OBSERVATION is reasonable and necessary in this patient based on the above information provided. The aforementioned taken together are felt to place the patient at high risk for further clinical deterioration. However it is anticipated that the patient may be medically stable for discharge from the hospital within 24 to 48 hours.    Karmen Bongo MD [facility]   How to contact the [facility] Attending or Consulting provider [place] or covering provider during after hours [place], for this patient?  1. Check the care team in [facility] and look for a) attending/consulting [facility] provider listed and b) the [facility] team listed 2. Log into [email or web address] and use [place]'s universal password to access. If you do not have the password, please contact the hospital operator. 3. Locate the [facility] provider you are looking for under [facility] and page to a number that you can be directly reached. 4. If you still have difficulty reaching the provider, please page the [facility] (Director on Call) for the Hospitalists listed on amion for assistance.   03/10/2019, 2:51 PM

## 2019-03-11 DIAGNOSIS — L02415 Cutaneous abscess of right lower limb: Secondary | ICD-10-CM

## 2019-03-11 DIAGNOSIS — L03115 Cellulitis of right lower limb: Secondary | ICD-10-CM | POA: Diagnosis not present

## 2019-03-11 LAB — CBC
HCT: 36.5 % (ref 36.0–46.0)
Hemoglobin: 11.9 g/dL — ABNORMAL LOW (ref 12.0–15.0)
MCH: 30.8 pg (ref 26.0–34.0)
MCHC: 32.6 g/dL (ref 30.0–36.0)
MCV: 94.6 fL (ref 80.0–100.0)
Platelets: 196 10*3/uL (ref 150–400)
RBC: 3.86 MIL/uL — ABNORMAL LOW (ref 3.87–5.11)
RDW: 15.2 % (ref 11.5–15.5)
WBC: 7.2 10*3/uL (ref 4.0–10.5)
nRBC: 0 % (ref 0.0–0.2)

## 2019-03-11 LAB — BASIC METABOLIC PANEL
Anion gap: 8 (ref 5–15)
BUN: 12 mg/dL (ref 8–23)
CO2: 25 mmol/L (ref 22–32)
Calcium: 8.1 mg/dL — ABNORMAL LOW (ref 8.9–10.3)
Chloride: 107 mmol/L (ref 98–111)
Creatinine, Ser: 0.72 mg/dL (ref 0.44–1.00)
GFR calc Af Amer: >60 mL/min (ref >60)
GFR calc non Af Amer: >60 mL/min (ref >60)
Glucose, Bld: 98 mg/dL (ref 70–99)
Potassium: 3.5 mmol/L (ref 3.5–5.1)
Sodium: 140 mmol/L (ref 135–145)

## 2019-03-11 MED ORDER — DOXYCYCLINE MONOHYDRATE 100 MG PO TABS: 100.0000 mg | ORAL_TABLET | Freq: Two times a day (BID) | ORAL | 0 refills | Status: AC

## 2019-03-11 MED ORDER — IBUPROFEN 800 MG PO TABS: 800.0000 mg | ORAL_TABLET | Freq: Three times a day (TID) | ORAL | 0 refills | Status: AC | PRN

## 2019-03-11 NOTE — Discharge Instructions (Signed)

## 2019-03-11 NOTE — Plan of Care (Signed)
  Problem: Education: Goal: Knowledge of General Education information will improve Description: Including pain rating scale, medication(s)/side effects and non-pharmacologic comfort measures Outcome: Adequate for Discharge   Problem: Health Behavior/Discharge Planning: Goal: Ability to manage health-related needs will improve Outcome: Adequate for Discharge   Problem: Clinical Measurements: Goal: Ability to maintain clinical measurements within normal limits will improve Outcome: Adequate for Discharge Goal: Will remain free from infection Outcome: Adequate for Discharge Goal: Diagnostic test results will improve Outcome: Adequate for Discharge Goal: Respiratory complications will improve Outcome: Adequate for Discharge Goal: Cardiovascular complication will be avoided Outcome: Adequate for Discharge   Problem: Activity: Goal: Risk for activity intolerance will decrease Outcome: Adequate for Discharge   Problem: Nutrition: Goal: Adequate nutrition will be maintained Outcome: Adequate for Discharge   Problem: Coping: Goal: Level of anxiety will decrease Outcome: Adequate for Discharge   Problem: Elimination: Goal: Will not experience complications related to bowel motility Outcome: Adequate for Discharge Goal: Will not experience complications related to urinary retention Outcome: Adequate for Discharge   Problem: Pain Managment: Goal: General experience of comfort will improve Outcome: Adequate for Discharge   Problem: Safety: Goal: Ability to remain free from injury will improve Outcome: Adequate for Discharge   Problem: Skin Integrity: Goal: Risk for impaired skin integrity will decrease Outcome: Adequate for Discharge   Problem: Clinical Measurements: Goal: Ability to avoid or minimize complications of infection will improve Outcome: Adequate for Discharge   Problem: Skin Integrity: Goal: Skin integrity will improve Outcome: Adequate for Discharge

## 2019-03-11 NOTE — Progress Notes (Signed)
Patient is ready for discharge home with daughter. Reviewed all discharge instructions including f/u appointments and prescriptions.

## 2019-03-11 NOTE — Discharge Summary (Addendum)
Physician Discharge Summary  Isabella [name] DOB: 18-Sep-1940 DOA: 03/09/2019  PCP: Glenis Smoker, MD  Admit date: 03/09/2019 Discharge date: 03/11/2019  Admitted From: Home Disposition: Home  Recommendations for Outpatient Follow-up:  1. Follow up with PCP in 1-2 weeks 2. Please obtain BMP/CBC in one week 3. Please follow up on the following pending results:  Home Health: None Equipment/Devices: None  Discharge Condition: Stable CODE STATUS: Full code Diet recommendation: Cardiac  Subjective: Seen and examined.  She states that she feels much better.  Right lower extremity pain has improved and erythema has improved significantly as well.  She desires to go home.  Brief/Interim Summary: Isabella [name] is a 78 y.o. female with medical history significant of OSA on CPAP; sarcoidosis; obesity; CAD; BLE lymphedema; HTN; Fuch's corneal dystrophy; and HLD presented to ED with right lower extremity infection/erythema.  She was diagnosed with right lower extremity cellulitis.  Started on IV vancomycin and admitted under hospitalist service.  She did not have any open ulcers or abscesses and cellulitis was nonpurulent.  Patient remained afebrile.  Leukocytosis which was initially mild has improved significantly as well.  When seen today, patient's erythema seems to have improved at least 50% and patient is very happy about that and her pain is much better although she still has some tenderness on palpation and some warmth on the right lower extremity.  Right upper extremity DVT was ruled out and she was diagnosed with superficial thrombosis.  Patient desires to go home and now that she is stable and afebrile so it will not be a bad idea.  She had similar type of cellulitis last year when she was hospitalized and she tells me that the oral antibiotics that she was sent home with worked very well.  Ideally I would have sent her on Keflex however she is allergic to penicillins.  I  checked and found out that last time she was discharged on doxycycline so we will discharge her once again on doxycycline 100 mg twice daily for 10 days and follow-up with PCP within 7 days. Also discharging her on ibuprofen 800 mg 3 times daily for 4 days for right lower extremity superficial thrombophlebitis.   Discharge Diagnoses:  Principal Problem:   Cellulitis and abscess of right leg Active Problems:   Hyperlipidemia   Obstructive sleep apnea   Essential hypertension   Obesity (BMI 30-39.9)   Coronary atherosclerosis of native coronary artery   Sarcoidosis   Thrombosis of right saphenous vein   Cellulitis of right lower extremity    Discharge Instructions  Discharge Instructions    Discharge patient   Complete by: As directed    Discharge disposition: 01-Home or Self Care   Discharge patient date: 03/11/2019     Allergies as of 03/11/2019      Reactions   Codeine Shortness Of Breath, Other (See Comments)   Made back hurt; does not affect liver per patient but has has caused difficulty breathing/tolerates Percocet only in small doses Back hurts does not affect liver per patient but has has caused difficulty breathing but tolerates Percocet only at small doses   Imodium A-d [loperamide Hcl] Shortness Of Breath, Other (See Comments)   Caused a pain & "squeezing" sensation in the lungs also   Neomycin Swelling, Other (See Comments)   Reaction to eye ointment - eyes became swollen shut Makes wounds ooze Reaction to eye ointment - eyes swelled shut Makes wounds ooze OTHER-eye drops-swelling of eye, redness  Penicillinase Hives, Shortness Of Breath, Rash   Has patient had a PCN reaction causing immediate rash, facial/tongue/throat swelling, SOB or lightheadedness with hypotension: Yes Has patient had a PCN reaction causing severe rash involving mucus membranes or skin necrosis: No Has patient had a PCN reaction that required hospitalization:No Has patient had a PCN  reaction occurring within the last 10 years: No If all of the above answers are "NO", then may proceed with Cephalosporin use.   Penicillins Hives, Shortness Of Breath, Rash   Reaction @ 18-yrs old Has patient had a PCN reaction causing immediate rash, facial/tongue/throat swelling, SOB or lightheadedness with hypotension: Yes Has patient had a PCN reaction causing severe rash involving mucus membranes or skin necrosis: No Has patient had a PCN reaction that required hospitalization: No Has patient had a PCN reaction occurring within the last 10 years: No If all of the above answers are "NO", then may proceed with Cephalosporin use.   Tramadol Shortness Of Breath, Other (See Comments)   (Per patient) "felt like lungs were being squeezed"   Lisinopril Cough   Hornet Venom Swelling   Swelling at site of sting   Adhesive [tape] Rash   Erythromycin Diarrhea   Hydromorphone Other (See Comments)   Severe Lethargy    Percocet [oxycodone-acetaminophen] Other (See Comments)   Confusion      Medication List    TAKE these medications   acetaminophen 500 MG tablet Commonly known as: TYLENOL Take 500 mg by mouth every 6 (six) hours as needed for mild pain.   aspirin EC 81 MG tablet Take 81 mg by mouth daily at 3 pm.   atorvastatin 80 MG tablet Commonly known as: LIPITOR TAKE 1 TABLET(80 MG) BY MOUTH DAILY What changed: See the new instructions.   Centrum Silver 50+Women Tabs Take 1 tablet by mouth daily with supper.   chlorpheniramine 4 MG tablet Commonly known as: CHLOR-TRIMETON Take 8 mg by mouth at bedtime.   doxycycline 100 MG tablet Commonly known as: ADOXA Take 1 tablet (100 mg total) by mouth 2 (two) times daily for 10 days.   furosemide 40 MG tablet Commonly known as: LASIX Take 40 mg by mouth daily.   ibuprofen 800 MG tablet Commonly known as: ADVIL Take 1 tablet (800 mg total) by mouth every 8 (eight) hours as needed for up to 4 days.   losartan 50 MG  tablet Commonly known as: COZAAR Take 1 tablet (50 mg total) by mouth daily.   mesalamine 0.375 g 24 hr capsule Commonly known as: APRISO Take 1,500 mg by mouth daily.   metoprolol tartrate 25 MG tablet Commonly known as: LOPRESSOR Take 25 mg by mouth 2 (two) times daily.   nitroGLYCERIN 0.4 MG SL tablet Commonly known as: NITROSTAT Place 1 tablet (0.4 mg total) under the tongue every 5 (five) minutes as needed for chest pain.   potassium chloride SA 20 MEQ tablet Commonly known as: KLOR-CON Take 20 mEq by mouth daily with supper.   Tofacitinib Citrate 5 MG Tabs Take 5 mg by mouth 2 (two) times daily. TAKING XELJANZ BRAND NAME   UNABLE TO FIND CPAP: At bedtime nightly and during all naps   Vitamin D 50 MCG (2000 UT) Caps Take 2,000 Units by mouth daily.      Follow-up Information    Glenis Smoker, MD Follow up in 1 week(s).   Specialty: Family Medicine Contact information: [address] [place] [address] [phone]  Belva Crome, MD .   Specialty: Cardiology Contact information: [phone] [place] [address] [phone]          Allergies  Allergen Reactions  . Codeine Shortness Of Breath and Other (See Comments)    Made back hurt; does not affect liver per patient but has has caused difficulty breathing/tolerates Percocet only in small doses Back hurts does not affect liver per patient but has has caused difficulty breathing but tolerates Percocet only at small doses  . Imodium A-D [Loperamide Hcl] Shortness Of Breath and Other (See Comments)    Caused a pain & "squeezing" sensation in the lungs also  . Neomycin Swelling and Other (See Comments)    Reaction to eye ointment - eyes became swollen shut Makes wounds ooze Reaction to eye ointment - eyes swelled shut Makes wounds ooze OTHER-eye drops-swelling of eye, redness  . Penicillinase Hives, Shortness Of Breath and Rash    Has patient had a  PCN reaction causing immediate rash, facial/tongue/throat swelling, SOB or lightheadedness with hypotension: Yes Has patient had a PCN reaction causing severe rash involving mucus membranes or skin necrosis: No Has patient had a PCN reaction that required hospitalization:No Has patient had a PCN reaction occurring within the last 10 years: No If all of the above answers are "NO", then may proceed with Cephalosporin use.   Marland Kitchen Penicillins Hives, Shortness Of Breath and Rash    Reaction @ 18-yrs old Has patient had a PCN reaction causing immediate rash, facial/tongue/throat swelling, SOB or lightheadedness with hypotension: Yes Has patient had a PCN reaction causing severe rash involving mucus membranes or skin necrosis: No Has patient had a PCN reaction that required hospitalization: No Has patient had a PCN reaction occurring within the last 10 years: No If all of the above answers are "NO", then may proceed with Cephalosporin use.  . Tramadol Shortness Of Breath and Other (See Comments)    (Per patient) "felt like lungs were being squeezed"   . Lisinopril Cough  . Hornet Venom Swelling    Swelling at site of sting  . Adhesive [Tape] Rash  . Erythromycin Diarrhea  . Hydromorphone Other (See Comments)    Severe Lethargy    . Percocet [Oxycodone-Acetaminophen] Other (See Comments)    Confusion     Consultations: None   Procedures/Studies: Vas [place] Lower Extremity Venous (dvt) (only Mc & Wl 7a-7p)  Result Date: 03/10/2019  Lower Venous Study Indications: Pain, Swelling, and Erythema. Cellulitis.  Limitations: Edema. Comparison Study: Prior study from 05/21/17 is available for comparison, patient                   had acute SVT of right SSV. Performing Technologist: Sharion Dove RVS  Examination Guidelines: A complete evaluation includes B-mode imaging, spectral Doppler, color Doppler, and power Doppler as needed of all accessible portions of each vessel. Bilateral testing is considered  an integral part of a complete examination. Limited examinations for reoccurring indications may be performed as noted.  +---------+---------------+---------+-----------+----------+---------------+ RIGHT    CompressibilityPhasicitySpontaneityPropertiesThrombus Aging  +---------+---------------+---------+-----------+----------+---------------+ CFV      Full           Yes      Yes                                  +---------+---------------+---------+-----------+----------+---------------+ SFJ      Full                                                         +---------+---------------+---------+-----------+----------+---------------+  FV Prox  Full                                                         +---------+---------------+---------+-----------+----------+---------------+ FV Mid   Full                                                         +---------+---------------+---------+-----------+----------+---------------+ FV DistalFull                                                         +---------+---------------+---------+-----------+----------+---------------+ PFV      Full                                                         +---------+---------------+---------+-----------+----------+---------------+ POP      Full                                                         +---------+---------------+---------+-----------+----------+---------------+ PTV                                                   patent by color +---------+---------------+---------+-----------+----------+---------------+ PERO                                                  Not visualized  +---------+---------------+---------+-----------+----------+---------------+ HSV      Full                                                         +---------+---------------+---------+-----------+----------+---------------+ SSV      None                                          Acute           +---------+---------------+---------+-----------+----------+---------------+   Right Technical Findings: Not visualized segments include peroneal vein.  +----+---------------+---------+-----------+----------+--------------+ LEFTCompressibilityPhasicitySpontaneityPropertiesThrombus Aging +----+---------------+---------+-----------+----------+--------------+ CFV Full           Yes      Yes                                 +----+---------------+---------+-----------+----------+--------------+  Summary: Right: Findings consistent with acute superficial vein thrombosis involving the right small saphenous vein. There is no evidence of deep vein thrombosis in the lower extremity. However, portions of this examination were limited- see technologist comments  above. Acute superficial thrombosis noted in the small saphenous vein. Left: No evidence of common femoral vein obstruction.  *See table(s) above for measurements and observations. Electronically signed by Harold Barban MD on 03/10/2019 at 1:12:01 PM.    Final       Discharge Exam: Vitals:   03/11/19 0030 03/11/19 0505  BP: 132/67 132/66  Pulse: 69 70  Resp: 18 12  Temp: 98.3 F (36.8 C) 98.1 F (36.7 C)  SpO2: 98% 98%   Vitals:   03/10/19 1816 03/10/19 2245 03/11/19 0030 03/11/19 0505  BP: (!) 122/57 (!) 153/75 132/67 132/66  Pulse: 86 89 69 70  Resp: 16  18 12   Temp: 97.8 F (36.6 C)  98.3 F (36.8 C) 98.1 F (36.7 C)  TempSrc: Oral  Oral Oral  SpO2: 99%  98% 98%  Weight:      Height:        General: Pt is alert, awake, not in acute distress Cardiovascular: RRR, S1/S2 +, no rubs, no gallops Respiratory: CTA bilaterally, no wheezing, no rhonchi Abdominal: Soft, NT, ND, bowel sounds + Extremities: Very minimal erythema on the anterior part of the right thigh.  ower half of the right lower extremity still has erythema on approximately 50% of the skin area but the intensity has improved significantly  compared to yesterday.  She has some warmth and tenderness but no open sores, ulcers or weeping.    The results of significant diagnostics from this hospitalization (including imaging, microbiology, ancillary and laboratory) are listed below for reference.     Microbiology: Recent Results (from the past 240 hour(s))  Culture, blood (routine x 2)     Status: None (Preliminary result)   Collection Time: 03/10/19  8:37 AM   Specimen: BLOOD  Result Value Ref Range Status   Specimen Description BLOOD LEFT ANTECUBITAL  Final   Special Requests   Final    BOTTLES DRAWN AEROBIC AND ANAEROBIC Blood Culture results may not be optimal due to an inadequate volume of blood received in culture bottles   Culture   Final    NO GROWTH < 24 HOURS Performed at [facility], [place] [address] [place], [place] [address]    Report Status PENDING  Incomplete  Culture, blood (routine x 2)     Status: None (Preliminary result)   Collection Time: 03/10/19  9:46 AM   Specimen: BLOOD  Result Value Ref Range Status   Specimen Description BLOOD LEFT ANTECUBITAL  Final   Special Requests   Final    BOTTLES DRAWN AEROBIC AND ANAEROBIC Blood Culture adequate volume   Culture   Final    NO GROWTH < 24 HOURS Performed at [facility], [place] [address] [place], [place] [address]    Report Status PENDING  Incomplete  SARS CORONAVIRUS 2 (TAT 6-24 HRS) Nasopharyngeal Nasopharyngeal Swab     Status: None   Collection Time: 03/10/19 11:03 AM   Specimen: Nasopharyngeal Swab  Result Value Ref Range Status   SARS Coronavirus 2 NEGATIVE NEGATIVE Final    Comment: (NOTE) SARS-CoV-2 target nucleic acids are NOT DETECTED. The SARS-CoV-2 RNA is generally detectable in upper and lower respiratory specimens during the acute phase of infection. Negative results do not preclude SARS-CoV-2 infection, do not rule  out co-infections with other pathogens, and should not be used as the sole basis for treatment or other  patient management decisions. Negative results must be combined with clinical observations, patient history, and epidemiological information. The expected result is Negative. Fact Sheet for Patients: SugarRoll.be Fact Sheet for Healthcare Providers: https://www.woods-mathews.com/ This test is not yet approved or cleared by the [place] FDA and  has been authorized for detection and/or diagnosis of SARS-CoV-2 by FDA under an Emergency Use Authorization (EUA). This EUA will remain  in effect (meaning this test can be used) for the duration of the COVID-19 declaration under Section 56 4(b)(1) of the Act, 21 U.S.C. section 360bbb-3(b)(1), unless the authorization is terminated or revoked sooner. Performed at [facility], [place] [address] [place], [place] [address]      Labs: BNP (last 3 results) No results for input(s): BNP in the last 8760 hours. Basic Metabolic Panel: Recent Labs  Lab 03/09/19 1157 03/10/19 0835 03/11/19 0542  NA 135 138 140  K 3.7 3.4* 3.5  CL 104 104 107  CO2 21* 20* 25  GLUCOSE 150* 107* 98  BUN 20 18 12   CREATININE 1.14* 0.85 0.72  CALCIUM 8.8* 9.0 8.1*   Liver Function Tests: No results for input(s): AST, ALT, ALKPHOS, BILITOT, PROT, ALBUMIN in the last 168 hours. No results for input(s): LIPASE, AMYLASE in the last 168 hours. No results for input(s): AMMONIA in the last 168 hours. CBC: Recent Labs  Lab 03/09/19 1157 03/10/19 0835 03/11/19 0542  WBC 15.2* 10.8* 7.2  NEUTROABS 14.0* 9.8*  --   HGB 13.5 14.1 11.9*  HCT 41.3 43.4 36.5  MCV 94.7 96.4 94.6  PLT 201 197 196   Cardiac Enzymes: No results for input(s): CKTOTAL, CKMB, CKMBINDEX, TROPONINI in the last 168 hours. BNP: Invalid input(s): POCBNP CBG: No results for input(s): GLUCAP in the last 168 hours. D-Dimer No results for input(s): DDIMER in the last 72 hours. Hgb A1c No results for input(s): HGBA1C in the last 72  hours. Lipid Profile No results for input(s): CHOL, HDL, LDLCALC, TRIG, CHOLHDL, LDLDIRECT in the last 72 hours. Thyroid function studies No results for input(s): TSH, T4TOTAL, T3FREE, THYROIDAB in the last 72 hours.  Invalid input(s): FREET3 Anemia work up No results for input(s): VITAMINB12, FOLATE, FERRITIN, TIBC, IRON, RETICCTPCT in the last 72 hours. Urinalysis    Component Value Date/Time   COLORURINE YELLOW 10/26/2017 2032   APPEARANCEUR HAZY (A) 10/26/2017 2032   LABSPEC 1.009 10/26/2017 2032   PHURINE 5.0 10/26/2017 2032   GLUCOSEU NEGATIVE 10/26/2017 2032   HGBUR SMALL (A) 10/26/2017 2032   BILIRUBINUR NEGATIVE 10/26/2017 2032   KETONESUR NEGATIVE 10/26/2017 2032   PROTEINUR NEGATIVE 10/26/2017 2032   UROBILINOGEN 0.2 02/02/2014 2228   NITRITE NEGATIVE 10/26/2017 2032   LEUKOCYTESUR MODERATE (A) 10/26/2017 2032   Sepsis Labs Invalid input(s): PROCALCITONIN,  WBC,  LACTICIDVEN Microbiology Recent Results (from the past 240 hour(s))  Culture, blood (routine x 2)     Status: None (Preliminary result)   Collection Time: 03/10/19  8:37 AM   Specimen: BLOOD  Result Value Ref Range Status   Specimen Description BLOOD LEFT ANTECUBITAL  Final   Special Requests   Final    BOTTLES DRAWN AEROBIC AND ANAEROBIC Blood Culture results may not be optimal due to an inadequate volume of blood received in culture bottles   Culture   Final    NO GROWTH < 24 HOURS Performed at [facility], [place]  [address] [place] [address], [place], [place] [address]    Report Status PENDING  Incomplete  Culture, blood (routine x 2)     Status: None (Preliminary result)   Collection Time: 03/10/19  9:46 AM   Specimen: BLOOD  Result Value Ref Range Status   Specimen Description BLOOD LEFT ANTECUBITAL  Final   Special Requests   Final    BOTTLES DRAWN AEROBIC AND ANAEROBIC Blood Culture adequate volume   Culture   Final    NO GROWTH < 24 HOURS Performed at [facility], [place] [address]  [place], [place] [address]    Report Status PENDING  Incomplete  SARS CORONAVIRUS 2 (TAT 6-24 HRS) Nasopharyngeal Nasopharyngeal Swab     Status: None   Collection Time: 03/10/19 11:03 AM   Specimen: Nasopharyngeal Swab  Result Value Ref Range Status   SARS Coronavirus 2 NEGATIVE NEGATIVE Final    Comment: (NOTE) SARS-CoV-2 target nucleic acids are NOT DETECTED. The SARS-CoV-2 RNA is generally detectable in upper and lower respiratory specimens during the acute phase of infection. Negative results do not preclude SARS-CoV-2 infection, do not rule out co-infections with other pathogens, and should not be used as the sole basis for treatment or other patient management decisions. Negative results must be combined with clinical observations, patient history, and epidemiological information. The expected result is Negative. Fact Sheet for Patients: SugarRoll.be Fact Sheet for Healthcare Providers: https://www.woods-mathews.com/ This test is not yet approved or cleared by the [place] FDA and  has been authorized for detection and/or diagnosis of SARS-CoV-2 by FDA under an Emergency Use Authorization (EUA). This EUA will remain  in effect (meaning this test can be used) for the duration of the COVID-19 declaration under Section 56 4(b)(1) of the Act, 21 U.S.C. section 360bbb-3(b)(1), unless the authorization is terminated or revoked sooner. Performed at [facility], [place] [address], [place], [place] [address]      Time coordinating discharge: Over 30 minutes  SIGNED:   Darliss Cheney, MD  [facility] 03/11/2019, 10:20 AM  If 7PM-7AM, please contact night-coverage [email or web address] Password [email or web address]

## 2019-03-15 LAB — CULTURE, BLOOD (ROUTINE X 2)
Culture: NO GROWTH
Culture: NO GROWTH
Special Requests: ADEQUATE

## 2019-03-30 ENCOUNTER — Other Ambulatory Visit: Payer: Self-pay

## 2019-03-30 DIAGNOSIS — M7989 Other specified soft tissue disorders: Secondary | ICD-10-CM

## 2019-04-01 ENCOUNTER — Ambulatory Visit (HOSPITAL_COMMUNITY)
Admission: RE | Admit: 2019-04-01 | Discharge: 2019-04-01 | Disposition: A | Discharge status: Home / Self Care | Payer: Medicare Other | Source: Ambulatory Visit | Attending: Vascular Surgery | Admitting: Vascular Surgery

## 2019-04-01 ENCOUNTER — Ambulatory Visit: Payer: Medicare Other | Admitting: Vascular Surgery

## 2019-04-01 ENCOUNTER — Encounter: Payer: Self-pay | Admitting: Vascular Surgery

## 2019-04-01 ENCOUNTER — Other Ambulatory Visit: Payer: Self-pay

## 2019-04-01 VITALS — BP 163/80 | HR 62 | Temp 97.8°F | Resp 20 | Ht 61.0 in | Wt 213.8 lb

## 2019-04-01 DIAGNOSIS — M7989 Other specified soft tissue disorders: Secondary | ICD-10-CM | POA: Insufficient documentation

## 2019-04-01 DIAGNOSIS — I890 Lymphedema, not elsewhere classified: Secondary | ICD-10-CM

## 2019-04-01 DIAGNOSIS — I83811 Varicose veins of right lower extremities with pain: Secondary | ICD-10-CM

## 2019-04-01 NOTE — Progress Notes (Signed)
Patient is a 78 year old female who returns for follow-up today.  She was last seen October 2019.  She has had another episode of cellulitis in her right lower extremity.  She was in the hospital briefly for this.  She was given antibiotics in the leg improved.  She has minimal redness in her right leg at this point.  She has not really been compliant with her compression stockings stating that she thinks they do not really fit correctly.  She does wear "tightness on her leg which she says improves the swelling a little bit.  She currently has no skin breakdown.  Other chronic medical problems remain multiple joint arthritis, coronary artery disease, elevated cholesterol, hypertension all of which have been stable.  Past Medical History:  Diagnosis Date  . Arthritis   . Complication of anesthesia    Took a while to wake up with knee  . Coronary artery disease    MI 2012, 2019, Dr. [name]  . Dyspnea    with walking at times  . Fallen bladder   . GERD (gastroesophageal reflux disease)   . 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 Headache    migraines in her younger years  . Hemorrhoids   . High cholesterol   . History of blood transfusion   . Hx of adenomatous colonic polyps   . Hypertension   . Lung nodule   . Lymphedema    BLE  . Nocturnal leg cramps   . Obesity   . Sleep apnea    uses cpap    Past Surgical History:  Procedure Laterality Date  . ABDOMINAL HYSTERECTOMY  1990  . APPENDECTOMY  1970  . BACK SURGERY  2015   lower back  . CARDIAC CATHETERIZATION  03/2011  . CARPAL TUNNEL RELEASE Bilateral 2004  . CATARACT EXTRACTION    . CHOLECYSTECTOMY  1970  . COLONOSCOPY W/ BIOPSIES AND POLYPECTOMY    . CORONARY ANGIOPLASTY  2012  . CORONARY STENT PLACEMENT  03/24/2011   Dr. [name]  . EYE SURGERY  2008,2011   corneal transplant  . FEMORAL ARTERY EXPLORATION  03/25/2011   Procedure:  FEMORAL ARTERY EXPLORATION;  Surgeon: Elam Dutch, MD;  Location: [place];  Service: Vascular;  Laterality: Right;  Evacuation of Hematoma   . hematoma surgery     from cardiac cath -hematoma in groin-right  . HERNIA REPAIR  6237   umbilical  . JOINT REPLACEMENT    . KNEE ARTHROSCOPY  2006   Right  . [place] [facility] Left Eye  2012  . LEFT HEART CATH AND CORONARY ANGIOGRAPHY N/A 01/27/2018   Procedure: LEFT HEART CATH AND CORONARY ANGIOGRAPHY;  Surgeon: Belva Crome, MD;  Location: [place] [facility];  Service: Cardiovascular;  Laterality: N/A;  . LEFT HEART CATHETERIZATION WITH CORONARY ANGIOGRAM N/A 03/24/2011   Procedure: LEFT HEART CATHETERIZATION WITH CORONARY ANGIOGRAM;  Surgeon: Sinclair Grooms, MD;  Location[facility] C[facility];  Service: Cardiovascular;  Laterality: N/A;  . PERCUTANEOUS CORONARY STENT INTERVENTION (PCI-S) N/A 03/24/2011   Procedure: PERCUTANEOUS CORONARY STENT INTERVENTION (PCI-S);  Surgeon: Sinclair Grooms, MD;  Location: [facility] CATH LAB;  Service: Cardiovascular;  Laterality: N/A;  . SHOULDER SURGERY  2010  . TONSILLECTOMY  1949  . TONSILLECTOMY AND ADENOIDECTOMY    . TOTAL KNEE ARTHROPLASTY  07/2010   Left  . TOTAL KNEE ARTHROPLASTY  06/17/2012  Procedure: TOTAL KNEE ARTHROPLASTY;  Surgeon: Gearlean Alf, MD;  Location: [facility];  Service: Orthopedics;  Laterality: Right;  . TUBAL LIGATION  1978  . [place], 2007   x 2  . VIDEO BRONCHOSCOPY WITH ENDOBRONCHIAL NAVIGATION  04/13/2012   Procedure: VIDEO BRONCHOSCOPY WITH ENDOBRONCHIAL NAVIGATION;  Surgeon: Melrose Nakayama, MD;  Location: [place];  Service: Thoracic;  Laterality: N/A;  . VIDEO BRONCHOSCOPY WITH ENDOBRONCHIAL NAVIGATION N/A 04/08/2016   Procedure: VIDEO BRONCHOSCOPY WITH ENDOBRONCHIAL NAVIGATION;  Surgeon: Rigoberto Noel, MD;  Location: [place];  Service: Thoracic;  Laterality: N/A;    Current Outpatient Medications on File Prior to Visit  Medication Sig Dispense Refill  .  acetaminophen (TYLENOL) 500 MG tablet Take 500 mg by mouth every 6 (six) hours as needed for mild pain.    Marland Kitchen aspirin EC 81 MG tablet Take 81 mg by mouth daily at 3 pm.    . atorvastatin (LIPITOR) 80 MG tablet TAKE 1 TABLET(80 MG) BY MOUTH DAILY (Patient taking differently: Take 80 mg by mouth daily at 6 PM. ) 90 tablet 1  . chlorpheniramine (CHLOR-TRIMETON) 4 MG tablet Take 8 mg by mouth at bedtime.     . Cholecalciferol (VITAMIN D) 50 MCG (2000 UT) CAPS Take 2,000 Units by mouth daily.    . furosemide (LASIX) 40 MG tablet Take 40 mg by mouth daily.    Marland Kitchen losartan (COZAAR) 50 MG tablet Take 1 tablet (50 mg total) by mouth daily. 90 tablet 3  . mesalamine (APRISO) 0.375 g 24 hr capsule Take 1,500 mg by mouth daily.    . metoprolol tartrate (LOPRESSOR) 25 MG tablet Take 25 mg by mouth 2 (two) times daily.     . Multiple Vitamins-Minerals (CENTRUM SILVER 50+WOMEN) TABS Take 1 tablet by mouth daily with supper.     . nitroGLYCERIN (NITROSTAT) 0.4 MG SL tablet Place 1 tablet (0.4 mg total) under the tongue every 5 (five) minutes as needed for chest pain. 25 tablet 5  . potassium chloride SA (K-DUR,KLOR-CON) 20 MEQ tablet Take 20 mEq by mouth daily with supper.     . Tofacitinib Citrate 5 MG TABS Take 5 mg by mouth 2 (two) times daily. TAKING XELJANZ BRAND NAME    . UNABLE TO FIND CPAP: At bedtime nightly and during all naps     No current facility-administered medications on file prior to visit.     Review of systems: She currently has no chest pain.  She has no shortness of breath.  She states her cardiac status has been stable.  Physical exam:  Vitals:   04/01/19 1452  BP: (!) 163/80  Pulse: 62  Resp: 20  Temp: 97.8 F (36.6 C)  SpO2: 99%  Weight: 213 lb 12.8 oz (97 kg)  Height: 5' 1"  (1.549 m)    Skin: No ulcer or rash some mild erythema gaiter area right leg  Musculoskeletal: Edema right leg some pitting and nonpitting with buffalo hump consistent with lymphedema.  Data: Patient  had a duplex ultrasound today which shows resolution of the lesser saphenous thrombophlebitis previously present.  No evidence of DVT.  I reviewed her previous duplex exam for reflux which showed reflux in the right greater saphenous vein but the vein was fairly small about 4 mm diameter.  Assessment: Chronic recurrent cellulitis right lower extremity now essentially resolved.  Patient has a combination of venous and lymphedema problems with the right lower extremity.  She is not really a candidate  for laser ablation due to the small size of the vein.  I emphasized to the patient today that compression therapy is going to be the mainstay and that she needs to make sure that she is wearing her compression stockings more vigilantly.  Otherwise she is going to be at risk of recurrent cellulitis and skin breakdown.  Plan: Patient will continue to wear her compression stockings as much as possible especially in the right leg.  She will follow-up with [place] on an as-needed basis.  Ruta Hinds, MD [facility] Office: [phone]

## 2019-04-14 ENCOUNTER — Other Ambulatory Visit: Payer: Self-pay | Admitting: Interventional Cardiology

## 2019-04-14 MED ORDER — NITROGLYCERIN 0.4 MG SL SUBL: 0.4000 mg | SUBLINGUAL_TABLET | SUBLINGUAL | 5 refills | Status: DC | PRN

## 2019-04-19 ENCOUNTER — Other Ambulatory Visit: Payer: Self-pay | Admitting: Interventional Cardiology

## 2019-04-19 MED ORDER — LOSARTAN POTASSIUM 50 MG PO TABS: 50.0000 mg | ORAL_TABLET | Freq: Every day | ORAL | 1 refills | Status: DC

## 2019-06-27 ENCOUNTER — Encounter (HOSPITAL_COMMUNITY): Payer: Self-pay

## 2019-06-27 ENCOUNTER — Other Ambulatory Visit: Payer: Self-pay

## 2019-06-27 ENCOUNTER — Ambulatory Visit (HOSPITAL_COMMUNITY)
Admission: EM | Admit: 2019-06-27 | Discharge: 2019-06-27 | Disposition: A | Discharge status: Home / Self Care | Payer: Medicare Other | Attending: Emergency Medicine | Admitting: Emergency Medicine

## 2019-06-27 DIAGNOSIS — Z20822 Contact with and (suspected) exposure to covid-19: Secondary | ICD-10-CM | POA: Diagnosis not present

## 2019-06-27 NOTE — ED Triage Notes (Signed)
Pt presents to UC for COVID testing. Pt reports her daughter tested positive for C0VID 2 days ago, they were together 9 days before her daughter tested positive for COVID. Pt had diarrhea and vomiting last night. Pt had the 1st COVID vaccine shot 4 days ago.

## 2019-06-27 NOTE — ED Provider Notes (Signed)
HPI  SUBJECTIVE:  Isabella [name] is a 79 y.o. female who presents with nausea, 3 episodes of nonbilious nonbloody vomiting, 1 episode of watery nonbloody diarrhea and crampy diffuse abdominal pain last night.  This has since resolved.  She was exposed to her daughter 10 days ago who recently tested positive for Covid.  Patient states that she got the first Covid vaccine 3 days ago.  No fevers, body aches, headaches, nasal congestion, sore throat, fatigue, loss of sense of taste or smell, cough, shortness of breath.  No exposure to anyone with a diarrheal illness, raw or undercooked foods, questionable leftovers or change in her medications.  She is tolerating p.o. today.  She has a past medical history of MI, sarcoidosis, CHF, coronary disease, ulcerative colitis on a DMARD, hypertension.  No history of chronic kidney disease, HIV, cancer, diabetes, pulmonary disease, smoking.  AST:[id number], Anastasia Pall, MD   Past Medical History:  Diagnosis Date  . Arthritis   . Complication of anesthesia    Took a while to wake up with knee  . Coronary artery disease    MI 2012, 2019, Dr. [name]  . Dyspnea    with walking at times  . Fallen bladder   . GERD (gastroesophageal reflux disease)   . 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 Headache    migraines in her younger years  . Hemorrhoids   . High cholesterol   . History of blood transfusion   . Hx of adenomatous colonic polyps   . Hypertension   . Lung nodule   . Lymphedema    BLE  . Nocturnal leg cramps   . Obesity   . Sleep apnea    uses cpap    Past Surgical History:  Procedure Laterality Date  . ABDOMINAL HYSTERECTOMY  1990  . APPENDECTOMY  1970  . BACK SURGERY  2015   lower back  . CARDIAC CATHETERIZATION  03/2011  . CARPAL TUNNEL RELEASE Bilateral 2004  . CATARACT EXTRACTION    . CHOLECYSTECTOMY  1970  . COLONOSCOPY W/ BIOPSIES AND  POLYPECTOMY    . CORONARY ANGIOPLASTY  2012  . CORONARY STENT PLACEMENT  03/24/2011   Dr. [name]  . EYE SURGERY  2008,2011   corneal transplant  . FEMORAL ARTERY EXPLORATION  03/25/2011   Procedure: FEMORAL ARTERY EXPLORATION;  Surgeon: Elam Dutch, MD;  Location: [place];  Service: Vascular;  Laterality: Right;  Evacuation of Hematoma   . hematoma surgery     from cardiac cath -hematoma in groin-right  . HERNIA REPAIR  8921   umbilical  . JOINT REPLACEMENT    . KNEE ARTHROSCOPY  2006   Right  . [place] Surgery Left Eye  2012  . LEFT HEART CATH AND CORONARY ANGIOGRAPHY N/A 01/27/2018   Procedure: LEFT HEART CATH AND CORONARY ANGIOGRAPHY;  Surgeon: Belva Crome, MD;  Location: [place] [facility];  Service: Cardiovascular;  Laterality: N/A;  . LEFT HEART CATHETERIZATION WITH CORONARY ANGIOGRAM N/A 03/24/2011   Procedure: LEFT HEART CATHETERIZATION WITH CORONARY ANGIOGRAM;  Surgeon: Sinclair Grooms, MD;  Location: [facility] CATH LAB;  Service: Cardiovascular;  Laterality: N/A;  . PERCUTANEOUS CORONARY STENT INTERVENTION (PCI-S) N/A 03/24/2011   Procedure: PERCUTANEOUS CORONARY STENT INTERVENTION (PCI-S);  Surgeon: Sinclair Grooms, MD;  Location: [facility] CATH LAB;  Service: Cardiovascular;  Laterality: N/A;  . SHOULDER SURGERY  2010  .  TONSILLECTOMY  1949  . TONSILLECTOMY AND ADENOIDECTOMY    . TOTAL KNEE ARTHROPLASTY  07/2010   Left  . TOTAL KNEE ARTHROPLASTY  06/17/2012   Procedure: TOTAL KNEE ARTHROPLASTY;  Surgeon: Gearlean Alf, MD;  [facility];  Service: Orthopedics;  Laterality: Right;  . TUBAL LIGATION  1978  . [place], 2007   x 2  . VIDEO BRONCHOSCOPY WITH ENDOBRONCHIAL NAVIGATION  04/13/2012   Procedure: VIDEO BRONCHOSCOPY WITH ENDOBRONCHIAL NAVIGATION;  Surgeon: Melrose Nakayama, MD;  Location: [place];  Service: Thoracic;  Laterality: N/A;  . VIDEO BRONCHOSCOPY WITH ENDOBRONCHIAL NAVIGATION N/A 04/08/2016   Procedure: VIDEO BRONCHOSCOPY WITH  ENDOBRONCHIAL NAVIGATION;  Surgeon: Rigoberto Noel, MD;  Location: [facility];  Service: Thoracic;  Laterality: N/A;    Family History  Problem Relation Age of Onset  . Hypertension Mother   . Cancer Father   . Breast cancer Maternal Grandmother     Social History   Tobacco Use  . Smoking status: Never Smoker  . Smokeless tobacco: Never Used  Substance Use Topics  . Alcohol use: No  . Drug use: No    No current facility-administered medications for this encounter.  Current Outpatient Medications:  .  acetaminophen (TYLENOL) 500 MG tablet, Take 500 mg by mouth every 6 (six) hours as needed for mild pain., Disp: , Rfl:  .  aspirin EC 81 MG tablet, Take 81 mg by mouth daily at 3 pm., Disp: , Rfl:  .  atorvastatin (LIPITOR) 80 MG tablet, TAKE 1 TABLET(80 MG) BY MOUTH DAILY (Patient taking differently: Take 80 mg by mouth daily at 6 PM. ), Disp: 90 tablet, Rfl: 1 .  chlorpheniramine (CHLOR-TRIMETON) 4 MG tablet, Take 8 mg by mouth at bedtime. , Disp: , Rfl:  .  Cholecalciferol (VITAMIN D) 50 MCG (2000 UT) CAPS, Take 2,000 Units by mouth daily., Disp: , Rfl:  .  furosemide (LASIX) 40 MG tablet, Take 40 mg by mouth daily., Disp: , Rfl:  .  losartan (COZAAR) 50 MG tablet, Take 1 tablet (50 mg total) by mouth daily., Disp: 90 tablet, Rfl: 1 .  mesalamine (APRISO) 0.375 g 24 hr capsule, Take 1,500 mg by mouth daily., Disp: , Rfl:  .  metoprolol tartrate (LOPRESSOR) 25 MG tablet, Take 25 mg by mouth 2 (two) times daily. , Disp: , Rfl:  .  Multiple Vitamins-Minerals (CENTRUM SILVER 50+WOMEN) TABS, Take 1 tablet by mouth daily with supper. , Disp: , Rfl:  .  nitroGLYCERIN (NITROSTAT) 0.4 MG SL tablet, Place 1 tablet (0.4 mg total) under the tongue every 5 (five) minutes as needed for chest pain., Disp: 25 tablet, Rfl: 5 .  potassium chloride SA (K-DUR,KLOR-CON) 20 MEQ tablet, Take 20 mEq by mouth daily with supper. , Disp: , Rfl:  .  Tofacitinib Citrate 5 MG TABS, Take 5 mg by mouth 2 (two) times  daily. TAKING XELJANZ BRAND NAME, Disp: , Rfl:  .  UNABLE TO FIND, CPAP: At bedtime nightly and during all naps, Disp: , Rfl:   Allergies  Allergen Reactions  . Codeine Shortness Of Breath and Other (See Comments)    Made back hurt; does not affect liver per patient but has has caused difficulty breathing/tolerates Percocet only in small doses Back hurts does not affect liver per patient but has has caused difficulty breathing but tolerates Percocet only at small doses  . Imodium A-D [Loperamide Hcl] Shortness Of Breath and Other (See Comments)    Caused a  pain & "squeezing" sensation in the lungs also  . Neomycin Swelling and Other (See Comments)    Reaction to eye ointment - eyes became swollen shut Makes wounds ooze Reaction to eye ointment - eyes swelled shut Makes wounds ooze OTHER-eye drops-swelling of eye, redness  . Penicillinase Hives, Shortness Of Breath and Rash    Has patient had a PCN reaction causing immediate rash, facial/tongue/throat swelling, SOB or lightheadedness with hypotension: Yes Has patient had a PCN reaction causing severe rash involving mucus membranes or skin necrosis: No Has patient had a PCN reaction that required hospitalization:No Has patient had a PCN reaction occurring within the last 10 years: No If all of the above answers are "NO", then may proceed with Cephalosporin use.   Marland Kitchen Penicillins Hives, Shortness Of Breath and Rash    Reaction @ 18-yrs old Has patient had a PCN reaction causing immediate rash, facial/tongue/throat swelling, SOB or lightheadedness with hypotension: Yes Has patient had a PCN reaction causing severe rash involving mucus membranes or skin necrosis: No Has patient had a PCN reaction that required hospitalization: No Has patient had a PCN reaction occurring within the last 10 years: No If all of the above answers are "NO", then may proceed with Cephalosporin use.  . Tramadol Shortness Of Breath and Other (See Comments)    (Per  patient) "felt like lungs were being squeezed"   . Lisinopril Cough  . [place] Venom Swelling    Swelling at site of sting  . Adhesive [Tape] Rash  . Erythromycin Diarrhea  . Hydromorphone Other (See Comments)    Severe Lethargy    . Percocet [Oxycodone-Acetaminophen] Other (See Comments)    Confusion      ROS  As noted in HPI.   Physical Exam  BP 116/70 (BP Location: Right Arm)   Pulse 64   Temp 98.2 F (36.8 C) (Oral)   Resp 16   SpO2 97%   Constitutional: Well developed, well nourished, no acute distress Eyes:  EOMI, conjunctiva normal bilaterally HENT: Normocephalic, atraumatic,mucus membranes moist Respiratory: Normal inspiratory effort lungs clear bilaterally good air movement  cardiovascular: Normal rate regular rhythm no murmurs rubs or gallops GI: nondistended nontender active bowel sounds.  No guarding rebound skin: No rash, skin intact Musculoskeletal: no deformities Neurologic: Alert & oriented x 3, no focal neuro deficits Psychiatric: Speech and behavior appropriate   ED Course   Medications - No data to display  Orders Placed This Encounter  Procedures  . Novel Coronavirus, NAA (Hosp order, Send-out to Ref Lab; TAT 18-24 hrs    Standing Status:   Standing    Number of Occurrences:   1    Order Specific Question:   Is this test for diagnosis or screening    Answer:   Screening    Order Specific Question:   Symptomatic for COVID-19 as defined by CDC    Answer:   No    Order Specific Question:   Hospitalized for COVID-19    Answer:   No    Order Specific Question:   Admitted to ICU for COVID-19    Answer:   No    Order Specific Question:   Previously tested for COVID-19    Answer:   Yes    Order Specific Question:   Resident in a congregate (group) care setting    Answer:   No    Order Specific Question:   Employed in healthcare setting    Answer:   No  Order Specific Question:   Pregnant    Answer:   No    No results found for this or  any previous visit (from the past 24 hour(s)). No results found.  ED Clinical Impression  1. Close exposure to COVID-19 virus   2. Encounter for laboratory testing for COVID-19 virus      ED Assessment/Plan  Patient asymptomatic today.  Abdomen benign.  Appears nontoxic.  Covid PCR sent.  She will be infusion clinic candidate if it comes back positive due to history of coronary disease and current immunocompromised state.  Gave her ER return precautions.   No orders of the defined types were placed in this encounter.   *This clinic note was created using Dragon dictation software. Therefore, there may be occasional mistakes despite careful proofreading.   ?   Melynda Ripple, MD 06/28/19 [phone]

## 2019-06-27 NOTE — Discharge Instructions (Addendum)
We will contact you if your Covid PCR comes back positive.

## 2019-06-28 LAB — NOVEL CORONAVIRUS, NAA (HOSP ORDER, SEND-OUT TO REF LAB; TAT 18-24 HRS): SARS-CoV-2, NAA: NOT DETECTED

## 2019-09-09 ENCOUNTER — Other Ambulatory Visit: Payer: Self-pay | Admitting: Cardiology

## 2019-10-08 NOTE — Progress Notes (Signed)
Cardiology Office Note   Date:  10/12/2019   ID:  Floyd, Lusignan 03-31-1941, MRN [id number]  PCP:  Glenis Smoker, MD  Cardiologist: Dr. [name], MD  Chief Complaint  Patient presents with   Follow-up      History of Present Illness: VALLEY KE is a 79 y.o. female who presents for 82-monthfollow-up, seen for Dr. Dr. STamala Julian Ms. WSpagnolohas a history of CAD (with scad) s/p STEMI in 2012 treated with DES to the RCA x2 proximal for catheter induced dissection distal to treat acute lesion, ulcerative colitis, HTN, HLD, chronic diastolic CHF, chronic LE swelling with DVT (not on AC) and pulmonary sarcoidosis with biopsy.  In 2012, she had an MI with DES x 2 to the RCA (proximal for catheter-induced dissection and distal to treat acute lesion). Echo in 6/19 showed EF 60-65%.  She had a non-STEMI 01/2018 and per cath notes, the right coronary which was the site of SCAD in 2012 has completely healed and has normal appearance. She had widely patent ostial RCA stent which was placed due to catheter induced coronary dissection 2012. Overall normal LV function with EF 55% and mildly elevated LVEDP.   Recommendations were made to start high intensity statin, add long acting nitrates, ASA for moderate CAD and consideration of lower extremity doppler studies with mapping to exclude postphlebitic syndrome.   She has a history of sarcoidosis with multiple lung nodules and OSA.  She follows with pulmonology, Dr. AElsworth Soho  She is here today for 611-monthollow-up and reports she has been doing well from a CV standpoint.  She denies chest pain, shortness of breath, palpitations, orthopnea, PND, dizziness or syncope.  Was hospitalized at the end of the year for lower extremity cellulitis/phlebitis and is now followed by Dr. FiOneida AlarDoes report she is having mild tingling in her legs, similar prodromal symptoms prior to previous phlebitis. Was seen by her PCP last Friday and was given antibiotics  however she has not yet started.  Legs are edematous and reddened however this is chronic and unchanged.  Past Medical History:  Diagnosis Date   Arthritis    Complication of anesthesia    Took a while to wake up with knee   Coronary artery disease    MI 2012, 2019, Dr. 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 (states it was a partial)   Headache    migraines in her younger years   Hemorrhoids    High cholesterol    History of blood transfusion    Hx of adenomatous colonic polyps    Hypertension    Lung nodule    Lymphedema    BLE   Nocturnal leg cramps    Obesity    Sleep apnea    uses cpap    Past Surgical History:  Procedure Laterality Date   ABDOMINAL HYSTERECTOMY  [id number]   lower back   CARDIAC CATHETERIZATION  03/2011   CARPAL TUNNEL RELEASE Bilateral 2004   CATARACT EXTRACTION     CHOLECYSTECTOMY  1970   COLONOSCOPY W/ BIOPSIES AND POLYPECTOMY     CORONARY ANGIOPLASTY  2012   CORONARY STENT PLACEMENT  03/24/2011   Dr. SmTamala Julian  EYE SURGERY  2008,2011   corneal transplant   FEMORAL ARTERY EXPLORATION  03/25/2011   Procedure: FEMORAL ARTERY EXPLORATION;  Surgeon: Elam Dutch, MD;  Location: [facility] OR;  Service: Vascular;  Laterality: Right;  Evacuation of Hematoma    hematoma surgery     from cardiac cath -hematoma in groin-right   HERNIA REPAIR  1660   umbilical   JOINT REPLACEMENT     KNEE ARTHROSCOPY  2006   Right   Laser Surgery Left Eye  2012   LEFT HEART CATH AND CORONARY ANGIOGRAPHY N/A 01/27/2018   Procedure: LEFT HEART CATH AND CORONARY ANGIOGRAPHY;  Surgeon: Belva Crome, MD;  Location: [facility];  Service: Cardiovascular;  Laterality: N/A;   LEFT HEART CATHETERIZATION WITH CORONARY ANGIOGRAM N/A  03/24/2011   Procedure: LEFT HEART CATHETERIZATION WITH CORONARY ANGIOGRAM;  Surgeon: Sinclair Grooms, MD;  Location: [facility] CATH LAB;  Service: Cardiovascular;  Laterality: N/A;   PERCUTANEOUS CORONARY STENT INTERVENTION (PCI-S) N/A 03/24/2011   Procedure: PERCUTANEOUS CORONARY STENT INTERVENTION (PCI-S);  Surgeon: Sinclair Grooms, MD;  Location: [facility] CATH LAB;  Service: Cardiovascular;  Laterality: N/A;   SHOULDER SURGERY  2010   TONSILLECTOMY  1949   TONSILLECTOMY AND ADENOIDECTOMY     TOTAL KNEE ARTHROPLASTY  07/2010   Left   TOTAL KNEE ARTHROPLASTY  06/17/2012   Procedure: TOTAL KNEE ARTHROPLASTY;  Surgeon: Gearlean Alf, MD;  Location: [facility] ORS;  Service: Orthopedics;  Laterality: Right;   TUBAL LIGATION  6301   UMBILICAL HERNIA REPAIR  1990, 2007   x 2   VIDEO BRONCHOSCOPY WITH ENDOBRONCHIAL NAVIGATION  04/13/2012   Procedure: VIDEO BRONCHOSCOPY WITH ENDOBRONCHIAL NAVIGATION;  Surgeon: Melrose Nakayama, MD;  Location: [facility];  Service: Thoracic;  Laterality: N/A;   VIDEO BRONCHOSCOPY WITH ENDOBRONCHIAL NAVIGATION N/A 04/08/2016   Procedure: VIDEO BRONCHOSCOPY WITH ENDOBRONCHIAL NAVIGATION;  Surgeon: Rigoberto Noel, MD;  Location: [facility] OR;  Service: Thoracic;  Laterality: N/A;     Current Outpatient Medications  Medication Sig Dispense Refill   acetaminophen (TYLENOL) 500 MG tablet Take 500 mg by mouth every 6 (six) hours as needed for mild pain.     aspirin EC 81 MG tablet Take 81 mg by mouth daily at 3 pm.     atorvastatin (LIPITOR) 80 MG tablet Take 1 tablet (80 mg total) by mouth daily. Please make yearly appt with Dr. [name] for May for future refills. 1st attempt 90 tablet 0   chlorpheniramine (CHLOR-TRIMETON) 4 MG tablet Take 8 mg by mouth at bedtime.      Cholecalciferol (VITAMIN D) 50 MCG (2000 UT) CAPS Take 2,000 Units by mouth daily.     furosemide (LASIX) 40 MG tablet Take 40 mg by mouth daily.     losartan (COZAAR) 50 MG tablet Take 1 tablet (50 mg total) by  mouth daily. 90 tablet 1   mesalamine (APRISO) 0.375 g 24 hr capsule Take 1,500 mg by mouth daily.     metoprolol tartrate (LOPRESSOR) 25 MG tablet Take 25 mg by mouth 2 (two) times daily.      Multiple Vitamins-Minerals (CENTRUM SILVER 50+WOMEN) TABS Take 1 tablet by mouth daily with supper.      nitroGLYCERIN (NITROSTAT) 0.4 MG SL tablet Place 1 tablet (0.4 mg total) under the tongue every 5 (five) minutes as needed for chest pain. 25 tablet 5   potassium chloride SA (K-DUR,KLOR-CON) 20 MEQ tablet Take 20 mEq by mouth daily with supper.  Tofacitinib Citrate 5 MG TABS Take 5 mg by mouth 2 (two) times daily. TAKING XELJANZ BRAND NAME     UNABLE TO FIND CPAP: At bedtime nightly and during all naps     No current facility-administered medications for this visit.    Allergies:   Codeine, Imodium a-d [loperamide hcl], Neomycin, Penicillinase, Penicillins, Tramadol, Lisinopril, Hornet venom, Adhesive [tape], Erythromycin, Hydromorphone, and Percocet [oxycodone-acetaminophen]    Social History:  The patient  reports that she has never smoked. She has never used smokeless tobacco. She reports that she does not drink alcohol or use drugs.   Family History:  The patient's family history includes Breast cancer in her maternal grandmother; Cancer in her father; Hypertension in her mother.    ROS:  Please see the history of present illness.   Otherwise, review of systems are positive for none.  All other systems are reviewed and negative.    PHYSICAL EXAM: VS:  BP (!) 166/78    Pulse 60    Ht 5' 1.5" (1.562 m)    Wt 218 lb 12.8 oz (99.2 kg)    SpO2 97%    BMI 40.67 kg/m  , BMI Body mass index is 40.67 kg/m.   General: Overweight,  NAD Lungs:Clear to ausculation bilaterally. No wheezes, rales, or rhonchi. Breathing is unlabored. Cardiovascular: RRR with S1 S2, radial pulses 2+ bilaterally Neuro: Alert and oriented. No focal deficits. No facial asymmetry. MAE spontaneously. Psych:  Responds to questions appropriately with normal affect.     EKG:  EKG is ordered today. The ekg ordered today demonstrates SB with HR 57bpm and no change from prior tracing    Recent Labs: 03/11/2019: BUN 12; Creatinine, Ser 0.72; Hemoglobin 11.9; Platelets 196; Potassium 3.5; Sodium 140    Lipid Panel    Component Value Date/Time   CHOL 113 01/27/2018 0806   TRIG 35 01/27/2018 0806   HDL 60 01/27/2018 0806   CHOLHDL 1.9 01/27/2018 0806   VLDL 7 01/27/2018 0806   LDLCALC 46 01/27/2018 0806     Wt Readings from Last 3 Encounters:  10/12/19 218 lb 12.8 oz (99.2 kg)  04/01/19 213 lb 12.8 oz (97 kg)  03/09/19 208 lb (94.3 kg)     Other studies Reviewed: Additional studies/ records that were reviewed today include:   Echocardiogram 01/27/18: Study Conclusions  - Left ventricle: The cavity size was normal. Systolic function was normal. The estimated ejection fraction was in the range of 60% to 65%. Wall motion was normal; there were no regional wall motion abnormalities. Doppler parameters are consistent with abnormal left ventricular relaxation (grade 1 diastolic dysfunction). Doppler parameters are consistent with indeterminate ventricular filling pressure. - Aortic valve: Transvalvular velocity was within the normal range. There was no stenosis. There was no regurgitation. - Mitral valve: Transvalvular velocity was within the normal range. There was no evidence for stenosis. There was mild regurgitation. - Left atrium: The atrium was moderately dilated. - Right ventricle: The cavity size was normal. Wall thickness was normal. Systolic function was normal. - Tricuspid valve: There was mild regurgitation. - Pulmonary arteries: Systolic pressure was within the normal range. PA peak pressure: 24 mm Hg (S).  Cardiac catheterization 01/27/18:  Normal left main.  Large widely patent LAD without obstruction.  Spontaneous coronary dissection of the  first obtuse marginal branch, starting in the proximal vessel and extending to small branches distally. TIMI grade III flow noted.  Large dominant right coronary without obstructive disease. The continuation of the right coronary  which was the site of SCAD in 2012 has completely healed and has normal appearance. Widely patent ostial RCA stent which was placed due to catheter induced coronary dissection 2012.  Overall normal LV function with EF 55%. Mildly elevated LVEDP.  RECOMMENDATIONS:   Discontinue IV heparin.  High intensity statin therapy.  Continue low-dose long-acting nitrates.  Aspirin. No indication for dual antiplatelet therapy.  Progression based on clinical course.  Significant bilateral lower extremity edema of uncertain etiology. Consider bilateral lower extremity Doppler studies with mapping to exclude postphlebitic syndrome.  Recommend Aspirin 53m daily for moderate CAD.  ASSESSMENT AND PLAN:  1.  CAD with SCAD x2: -No recurrent anginal symptoms -Continue ASA, atorvastatin, Lopressor  2.  Chronic diastolic CHF: -Appears euvolemic on exam -Denies shortness of breath, LE edema -Last echocardiogram 01/27/18 with normal LVEF and G1DD -Has chronic LE edema>> relatively unchanged per patient report  3.  HTN: -Elevated today at 166/78 however reports home BPs in the 120 range>> some degree of whitecoat syndrome -Continue current regimen   4.  HLD: -Last LDL, 46 -Continue current regimen  5.  Pulmonary sarcoidosis: -Follows with pulmonary medicine/Dr. HRoxan Hockey-No longer on long-term steroids   Current medicines are reviewed at length with the patient today.  The patient does not have concerns regarding medicines.  The following changes have been made:  no change  Labs/ tests ordered today include: Labs per PCP  Orders Placed This Encounter  Procedures   EKG 12-Lead    Disposition:   FU with Dr. STamala Julian in 1 year  Signed, JKathyrn Drown NP  10/12/2019 12:44 PM    [email or web address] [place]  [id number]: [phone] Fax: [phone]

## 2019-10-12 ENCOUNTER — Ambulatory Visit: Payer: Medicare Other | Admitting: Cardiology

## 2019-10-12 ENCOUNTER — Other Ambulatory Visit: Payer: Self-pay

## 2019-10-12 ENCOUNTER — Encounter: Payer: Self-pay | Admitting: Cardiology

## 2019-10-12 VITALS — BP 166/78 | HR 60 | Ht 61.5 in | Wt 218.8 lb

## 2019-10-12 DIAGNOSIS — E669 Obesity, unspecified: Secondary | ICD-10-CM | POA: Diagnosis not present

## 2019-10-12 DIAGNOSIS — I5032 Chronic diastolic (congestive) heart failure: Secondary | ICD-10-CM | POA: Diagnosis not present

## 2019-10-12 DIAGNOSIS — I2510 Atherosclerotic heart disease of native coronary artery without angina pectoris: Secondary | ICD-10-CM | POA: Diagnosis not present

## 2019-10-12 DIAGNOSIS — I10 Essential (primary) hypertension: Secondary | ICD-10-CM

## 2019-10-12 DIAGNOSIS — R600 Localized edema: Secondary | ICD-10-CM

## 2019-10-12 NOTE — Patient Instructions (Signed)
Medication Instructions:   Your physician recommends that you continue on your current medications as directed. Please refer to the Current Medication list given to you today.  *If you need a refill on your cardiac medications before your next appointment, please call your pharmacy*  Lab Work:  None ordered today  Testing/Procedures:  None ordered today  Follow-Up: At [facility], you and your health needs are our priority.  As part of our continuing mission to provide you with exceptional heart care, we have created designated Provider Care Teams.  These Care Teams include your primary Cardiologist (physician) and Advanced Practice Providers (APPs -  Physician Assistants and Nurse Practitioners) who all work together to provide you with the care you need, when you need it.  We recommend signing up for the patient portal called "MyChart".  Sign up information is provided on this After Visit Summary.  MyChart is used to connect with patients for Virtual Visits (Telemedicine).  Patients are able to view lab/test results, encounter notes, upcoming appointments, etc.  Non-urgent messages can be sent to your provider as well.   To learn more about what you can do with MyChart, go to [email or web address].    Your next appointment:   12 month(s)  The format for your next appointment:   In Person  Provider:   Daneen Schick, MD

## 2019-10-18 ENCOUNTER — Telehealth: Payer: Self-pay

## 2019-10-18 ENCOUNTER — Other Ambulatory Visit: Payer: Self-pay | Admitting: *Deleted

## 2019-10-18 ENCOUNTER — Other Ambulatory Visit: Payer: Self-pay

## 2019-10-18 DIAGNOSIS — I83893 Varicose veins of bilateral lower extremities with other complications: Secondary | ICD-10-CM

## 2019-10-18 MED ORDER — LOSARTAN POTASSIUM 50 MG PO TABS: 50.0000 mg | ORAL_TABLET | Freq: Every day | ORAL | 1 refills | Status: DC

## 2019-10-18 NOTE — Telephone Encounter (Signed)
Pt reports she went to PCP on 6/1 for a rash on left leg and arm and was placed on doxycycline 100 mg BID and to call if no better. Reports she still has a non-itching, rash from groin down to knee on right leg with slight swelling and wanted Dr. Donnetta Hutching to take a look at it. Pt advised her symptoms appear to be nonvascular and would encourage her to contact her PCP regarding continued symptoms. Pt voiced understanding. Minette Brine, RN

## 2019-10-21 ENCOUNTER — Other Ambulatory Visit: Payer: Self-pay | Admitting: Thoracic Surgery (Cardiothoracic Vascular Surgery)

## 2019-10-21 DIAGNOSIS — R918 Other nonspecific abnormal finding of lung field: Secondary | ICD-10-CM

## 2019-12-09 ENCOUNTER — Ambulatory Visit
Admission: RE | Admit: 2019-12-09 | Discharge: 2019-12-09 | Disposition: A | Discharge status: Home / Self Care | Payer: Medicare Other | Source: Ambulatory Visit | Attending: Thoracic Surgery (Cardiothoracic Vascular Surgery) | Admitting: Thoracic Surgery (Cardiothoracic Vascular Surgery)

## 2019-12-09 DIAGNOSIS — R918 Other nonspecific abnormal finding of lung field: Secondary | ICD-10-CM

## 2019-12-13 ENCOUNTER — Telehealth: Payer: Self-pay

## 2019-12-13 NOTE — Telephone Encounter (Signed)
-----   Message from Melrose Nakayama, MD sent at 12/13/2019  2:30 PM EDT ----- Regarding: RE: Call Report for Chest CT [facility] ----- Message ----- From: Donnella Sham, RN Sent: 12/09/2019  11:53 AM EDT To: Melrose Nakayama, MD Subject: Call Report for [place],   [facility] contacted the office for a stat report of her Chest Ct that was done today.  Impression talks about waxing and waning of previous biopsied right lung nodule.  Please see report.  Thanks,  Caryl Pina

## 2019-12-14 ENCOUNTER — Ambulatory Visit: Payer: Medicare Other | Admitting: Thoracic Surgery (Cardiothoracic Vascular Surgery)

## 2019-12-14 ENCOUNTER — Other Ambulatory Visit: Payer: Self-pay

## 2019-12-14 ENCOUNTER — Other Ambulatory Visit: Payer: Self-pay | Admitting: Thoracic Surgery (Cardiothoracic Vascular Surgery)

## 2019-12-14 VITALS — BP 170/88 | HR 90 | Temp 97.7°F | Resp 20 | Ht 61.5 in | Wt 218.0 lb

## 2019-12-14 DIAGNOSIS — R918 Other nonspecific abnormal finding of lung field: Secondary | ICD-10-CM | POA: Diagnosis not present

## 2019-12-14 DIAGNOSIS — N2889 Other specified disorders of kidney and ureter: Secondary | ICD-10-CM

## 2019-12-14 NOTE — Progress Notes (Signed)
[address]       [place], [address]             [phone]      HPI: Isabella [name] returns for follow-up of multiple lung nodules  Isabella [name] is a 79 year old woman with history of multiple lung nodules, sarcoidosis, ulcerative colitis, coronary disease, reflux, sleep apnea, arthritis, hypertension, and lymphedema.  She was found to have a left lower lobe lung mass in 2013.  A CT-guided biopsy showed noncaseating granulomas.  That has resolved over time.  She has had waxing and waning nodules since then.  There was a nodule that showed activity on the PET in March 2018.  CT-guided biopsy showed no evidence of malignancy.  She was last seen in July 2020.  Her nodules were stable at that time.  Since her last visit she has been feeling well.  She has not had any respiratory issues.  She denies cough, fever, wheezing, shortness of breath.  She states that she has whitecoat hypertension her blood pressure is always significantly elevated when she sees a doctor.  Past Medical History:  Diagnosis Date  . Arthritis   . Complication of anesthesia    Took a while to wake up with knee  . Coronary artery disease    MI 2012, 2019, Dr. [name]  . Dyspnea    with walking at times  . Fallen bladder   . GERD (gastroesophageal reflux disease)   . 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 Headache    migraines in her younger years  . Hemorrhoids   . High cholesterol   . History of blood transfusion   . Hx of adenomatous colonic polyps   . Hypertension   . Lung nodule   . Lymphedema    BLE  . Nocturnal leg cramps   . Obesity   . Sleep apnea    uses cpap    Current Outpatient Medications  Medication Sig Dispense Refill  . acetaminophen (TYLENOL) 500 MG tablet Take 500 mg by mouth every 6 (six) hours as needed for mild pain.    Marland Kitchen aspirin EC 81 MG tablet Take 81 mg by mouth daily at 3  pm.    . atorvastatin (LIPITOR) 80 MG tablet Take 1 tablet (80 mg total) by mouth daily. Please make yearly appt with Dr. [name] for May for future refills. 1st attempt 90 tablet 0  . chlorpheniramine (CHLOR-TRIMETON) 4 MG tablet Take 8 mg by mouth at bedtime.     . Cholecalciferol (VITAMIN D) 50 MCG (2000 UT) CAPS Take 2,000 Units by mouth daily.    . furosemide (LASIX) 40 MG tablet Take 40 mg by mouth daily.    Marland Kitchen losartan (COZAAR) 50 MG tablet Take 1 tablet (50 mg total) by mouth daily. 90 tablet 1  . mesalamine (APRISO) 0.375 g 24 hr capsule Take 1,500 mg by mouth daily.    . metoprolol tartrate (LOPRESSOR) 25 MG tablet Take 25 mg by mouth 2 (two) times daily.     . Multiple Vitamins-Minerals (CENTRUM SILVER 50+WOMEN) TABS Take 1 tablet by mouth daily with supper.     . nitroGLYCERIN (NITROSTAT) 0.4 MG SL tablet Place 1 tablet (0.4 mg total) under the tongue every 5 (five) minutes as needed for chest pain. 25 tablet 5  . oxybutynin (DITROPAN-XL) 10 MG 24 hr tablet Take  10 mg by mouth at bedtime.    . potassium chloride SA (K-DUR,KLOR-CON) 20 MEQ tablet Take 20 mEq by mouth daily with supper.     . Tofacitinib Citrate 5 MG TABS Take 5 mg by mouth 2 (two) times daily. TAKING XELJANZ BRAND NAME    . UNABLE TO FIND CPAP: At bedtime nightly and during all naps     No current facility-administered medications for this visit.    Physical Exam BP (!) 170/88   Pulse 90   Temp 97.7 F (36.5 C) (Skin)   Resp 20   Ht 5' 1.5" (1.562 m)   Wt 218 lb (98.9 kg)   SpO2 96% Comment: RA  BMI 40.52 kg/m  Obese 79 year old woman in no acute distress Alert and oriented x3 with no focal deficits No cervical or supraclavicular adenopathy Cardiac regular rate and rhythm Lungs clear with equal breath sounds bilaterally  Diagnostic Tests: CT CHEST WITHOUT CONTRAST  TECHNIQUE: Multidetector CT imaging of the chest was performed following the standard protocol without IV contrast.  COMPARISON:   12/03/2018  FINDINGS: Cardiovascular: Calcified atheromatous plaque, minimal in the thoracic aorta. Heart size mildly enlarged with signs of prior myocardial infarction unchanged. Signs of RIGHT coronary stenting. No pericardial effusion.  Mediastinum/Nodes: RIGHT thyroid nodule 1.7 cm unchanged and recently biopsied. No thoracic inlet adenopathy. No mediastinal adenopathy. No gross hilar adenopathy with limited assessment on noncontrast imaging. Esophagus mildly patulous.  Lungs/Pleura: Nodule in the RIGHT lower lobe (image 82, series 8) 1.6 x 1.6 cm. Stable compared to the exam from 1 year ago, remaining smaller than on the study of March 31, 2018. Areas of calcification without dense coarse or central calcification show no change.  (Image 103, series 8) 1.5 x 1.4 cm RIGHT lower lobe pulmonary nodule more rounded than on the previous exam in the medial RIGHT lung base previously measuring 12 x 11 mm. This is however closer in terms of size to its previous size from 2019. Other scattered small nodules with similar appearance.  Fissural thickening in the LEFT chest with no change, associated with linear opacities compatible with scarring adjacent to the major fissure.  Upper Abdomen: Incidental imaging of upper abdominal contents shows stable appearance of a fatty density mass or area adjacent to the LEFT kidney, this does however show calcification on the current exam measuring approximately 4.7 x 3.7 cm near the upper margin of the lesion within 2 mm of its size since 2019. Hepatic cysts. No acute process in the upper abdomen.  Musculoskeletal: Spinal degenerative changes. No acute or destructive bone finding.  IMPRESSION: 1. Waxing and waning appearance of the medial, previously biopsied RIGHT lung nodule and stable appearance of the dominant nodule as compared to the most recent study, smaller than on a more remote evaluation. Findings remain most suggestive  of sequela of sarcoidosis. No new or suspicious findings. Continued attention on follow-up is suggested. 2. 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 Does show slow enlargement over time and now displays calcifications which are somewhat atypical for angiomyolipoma. Atypical lipomatous lesion in the retroperitoneum is considered for this reason. Follow-up abdominal imaging with without contrast may be helpful if possible to assess for any internal septation with enhancement or other suspicious features. Little change in size with respect imaged portions is seen since 2019. 3. Aortic atherosclerosis.  Aortic Atherosclerosis (ICD10-I70.0).   Electronically Signed   By: Zetta Bills M.D.   On: 12/09/2019 11:34 I personally reviewed  the CT images and concur with the findings noted above  Impression: Isabella [name] is a 79 year old woman with a past medical history significant for multiple lung nodules, sarcoidosis, ulcerative colitis, coronary disease, reflux, sleep apnea, arthritis, hypertension, and lymphedema.    Lung nodules-relatively stable.  Waxing and waning nodules that are consistent with sarcoidosis.  She is not currently on any treatment.  She is not having any symptoms currently.  Needs continued CT follow-up.  We will plan to reevaluate in a year.  Hypertension-blood pressure significantly elevated with a systolic of 974.  She says she checks her self regularly at home and is within normal limits.  She has whitecoat hypertension.  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  We will check a CT with and without IV contrast as recommended by radiology to further evaluate and see if any additional work-up or biopsy is needed.  Plan: CT of abdomen with and without IV contrast to further evaluate.  perinephric fatty mass. Return in 2 to 3 weeks to discuss results of CT abdomen  I spent over 20 minutes in review of records, review of  images, and in consultation with Mrs. [place] today. Melrose Nakayama, MD [facility] [phone]

## 2019-12-23 ENCOUNTER — Other Ambulatory Visit: Payer: Self-pay

## 2019-12-23 MED ORDER — ATORVASTATIN CALCIUM 80 MG PO TABS: 80.0000 mg | ORAL_TABLET | Freq: Every day | ORAL | 3 refills | Status: DC

## 2019-12-27 ENCOUNTER — Other Ambulatory Visit: Payer: Self-pay

## 2019-12-27 MED ORDER — ATORVASTATIN CALCIUM 80 MG PO TABS: 80.0000 mg | ORAL_TABLET | Freq: Every day | ORAL | 3 refills | Status: DC

## 2020-01-03 ENCOUNTER — Ambulatory Visit
Admission: RE | Admit: 2020-01-03 | Discharge: 2020-01-03 | Disposition: A | Discharge status: Home / Self Care | Payer: Medicare Other | Source: Ambulatory Visit | Attending: Thoracic Surgery (Cardiothoracic Vascular Surgery) | Admitting: Thoracic Surgery (Cardiothoracic Vascular Surgery)

## 2020-01-03 DIAGNOSIS — N2889 Other specified disorders of kidney and ureter: Secondary | ICD-10-CM

## 2020-01-03 MED ORDER — IOPAMIDOL (ISOVUE-300) INJECTION 61%
100.0000 mL | Freq: Once | INTRAVENOUS | Status: AC | PRN
  Administered 2020-01-03: 100 mL via INTRAVENOUS

## 2020-01-04 ENCOUNTER — Encounter: Payer: Self-pay | Admitting: Thoracic Surgery (Cardiothoracic Vascular Surgery)

## 2020-01-04 ENCOUNTER — Ambulatory Visit: Payer: Medicare Other | Admitting: Thoracic Surgery (Cardiothoracic Vascular Surgery)

## 2020-01-04 ENCOUNTER — Other Ambulatory Visit: Payer: Self-pay

## 2020-01-04 VITALS — BP 167/91 | HR 63 | Temp 97.7°F | Resp 20 | Ht 61.0 in | Wt 222.4 lb

## 2020-01-04 DIAGNOSIS — R918 Other nonspecific abnormal finding of lung field: Secondary | ICD-10-CM | POA: Diagnosis not present

## 2020-01-04 NOTE — Progress Notes (Signed)
[address]       [place] [address]             [phone]      Ms. Mettler returns to follow-up regarding her abdominal CT.  Caeli Linehan is a 79 year old woman with a history of lung nodules, sarcoidosis, ulcerative colitis, coronary disease, reflux, sleep apnea, arthritis, hypertension, and lymphedema.  She has been followed for lung nodules dating back to 2013.  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  She then had a nodule that increased in size and showed activity on the PET in March 2018.  CT-guided biopsy showed no evidence of malignancy.  On follow-up that nodule has waxed and waned but is never progressed.  I saw her on 12/14/2019.  Her lung nodules were stable.  However, there was concern about perinephric fatty mass that had slightly increased in size.  A CT of the abdomen with and without contrast was recommended.  We ordered that and that was done yesterday.  CT ABDOMEN WITHOUT AND WITH CONTRAST  TECHNIQUE: Multidetector CT imaging of the abdomen was performed following the standard protocol before and following the bolus administration of intravenous contrast.  CONTRAST:  150m ISOVUE-300 IOPAMIDOL (ISOVUE-300) INJECTION 61%  COMPARISON:  Chest CT 12/09/2019 PET-CT from 04/10/2018  FINDINGS: Lower chest: We partially include the bottom of the right lower lobe nodule on image 1/8 seen on prior chest CT. Minimal additional nodularity in both lower lobes similar to recent chest CT.  Hepatobiliary: Fluid density hepatic lesions compatible with cysts. Cholecystectomy. No specific worrisome hepatic lesions are identified.  Pancreas: 1.9 by 1.1 cm fluid density lesion along the pancreatic tail on image 41/19, no discrete enhancement. Roughly stable from 01/27/2018, not retrospectively apparent prior to that date  Spleen: Unremarkable  Adrenals/Urinary Tract: Both adrenal glands appear normal.  Bilateral renal cysts are present.  Localized stranding is present in the left perirenal adipose tissue adjacent to the upper pole. The very beginnings of this are suggested in 2016 some progressive increase in prominence up to today's exam or the region of involvement measures about 5.1 by 2.8 by 6.7 cm with internal scattered calcifications measuring up to 1.0 cm in long axis, and with some surrounding stranding along the perirenal fascia. On image 51/19, could activity of the lesion with a small hypodense lesion in the upper pole of the left kidney is raised, but is not considered a definite finding based on review of prior exams including 06/17/2016 and 01/27/2018. There are calcifications in this lesion, and at least some activity in lesion is suspected based on prior PET-CT. No well-defined enhancing vessels in the lesion. The progressive growth, scattered calcifications, lack of internal vessels, lack of definite connectivity into the renal parenchyma, and at least moderate metabolic activity in combination indicate a distinct possibility that this could be an unusual perirenal retroperitoneal liposarcoma rather than angiomyolipoma.  Assessment of the kidneys is mildly adversely affected by streak artifact from the patient's lumbar hardware.  Precontrast hyperdense 0.7 cm exophytic lesion from the left kidney lower pole is probably a complex cyst but technically too small to characterize.  Stomach/Bowel: There are few scattered diverticula of the descending colon.  Vascular/Lymphatic: Aortoiliac atherosclerotic vascular disease.  Other: No supplemental non-categorized findings.  Musculoskeletal: Ventral hernia mesh. PLIF at L3-L4-L5 solid interbody fusion.  IMPRESSION: 1. Enlarging fatty lesion in the left perirenal space has features raising concern for retroperitoneal liposarcoma including lack of visible  internal enhancing vessels, presence of  internal calcifications, growth which is not been totally rounded in nature, and lack of definite extension into the renal parenchyma. Unusual angiomyolipoma is not excluded but there is a substantial possibility that this represents retroperitoneal liposarcoma. 2. 1.9 cm in long axis fluid density lesion along the pancreatic tail, no discrete enhancement, roughly stable from 2019 but not readily seen previous to that. This could be a postinflammatory pancreatic lesion or an intraductal papillary mucinous neoplasm. We have currently established essentially 2 years of stability. Follow up pancreatic protocol MRI with and without contrast is recommended 1 years time. This recommendation follows ACR consensus guidelines: Management of Incidental Pancreatic Cysts: A White Paper of the ACR Incidental Findings Committee. J Am Coll Radiol 9692;49:324-199. 3. Other imaging findings of potential clinical significance: Hepatic and renal cysts. Scattered descending colon diverticula. Aortoiliac atherosclerotic vascular disease. PLIF at L3-L4-L5-L5 solid interbody fusion. 4. Aortic atherosclerosis.  Aortic Atherosclerosis (ICD10-I70.0).   Electronically Signed   By: Van Clines M.D.   On: 01/03/2020 13:43  Findings on CT of abdomen noted and discussed with patient.    Will refer to general surgery consideration additional work-up and/or resection.  She has seen Dr. Brantley Stage in the past.   Return in 1 year with CT chest to follow-up lung nodules.  Revonda Standard [name], MD [facility] [phone]

## 2020-02-22 ENCOUNTER — Other Ambulatory Visit: Payer: Self-pay | Admitting: Family Medicine

## 2020-02-22 DIAGNOSIS — M810 Age-related osteoporosis without current pathological fracture: Secondary | ICD-10-CM

## 2020-02-24 ENCOUNTER — Other Ambulatory Visit (HOSPITAL_COMMUNITY): Payer: Self-pay | Admitting: Urology

## 2020-02-24 ENCOUNTER — Other Ambulatory Visit: Payer: Self-pay | Admitting: Urology

## 2020-02-24 DIAGNOSIS — D49512 Neoplasm of unspecified behavior of left kidney: Secondary | ICD-10-CM

## 2020-03-02 ENCOUNTER — Ambulatory Visit (HOSPITAL_COMMUNITY)
Admission: RE | Admit: 2020-03-02 | Discharge: 2020-03-02 | Disposition: A | Discharge status: Home / Self Care | Payer: Medicare Other | Source: Ambulatory Visit | Attending: Urology | Admitting: Urology

## 2020-03-02 ENCOUNTER — Other Ambulatory Visit: Payer: Self-pay

## 2020-03-02 DIAGNOSIS — D49512 Neoplasm of unspecified behavior of left kidney: Secondary | ICD-10-CM | POA: Diagnosis present

## 2020-03-02 MED ORDER — GADOBUTROL 1 MMOL/ML IV SOLN
10.0000 mL | Freq: Once | INTRAVENOUS | Status: AC | PRN
  Administered 2020-03-02: 10 mL via INTRAVENOUS

## 2020-03-03 ENCOUNTER — Encounter (HOSPITAL_COMMUNITY): Payer: Self-pay | Admitting: Radiology

## 2020-03-03 NOTE — Progress Notes (Signed)
[name] Female, 79 y.o., May 27, 1940 MRN:  [id number] Phone:  [phone] (H) PCP:  Glenis Smoker, MD Coverage:  [facility] (GI-BCG DX DEXA 1) 06/12/2020 at 1:30 PM  RE: US Kidney biopsy Received: Today Suttle, Rosanne Ashing, MD  Garth Bigness D Approved for US guided left perinephric mass biopsy.   Dylan       Previous Messages   ----- Message -----  From: Garth Bigness D  Sent: 03/02/2020  6:39 PM EDT  To: Ir Procedure Requests  Subject: US Kidney biopsy                 Procedure: US Kidney Biopsy   Reason: Neoplasm of Kidney   History: CT, MR in computer   Provider: Raynelle Bring   Provider Contact:  [phone]

## 2020-03-13 ENCOUNTER — Other Ambulatory Visit: Payer: Self-pay | Admitting: Student

## 2020-03-14 ENCOUNTER — Ambulatory Visit (HOSPITAL_COMMUNITY)
Admission: RE | Admit: 2020-03-14 | Discharge: 2020-03-14 | Disposition: A | Discharge status: Home / Self Care | Payer: Medicare Other | Source: Ambulatory Visit | Attending: Urology | Admitting: Urology

## 2020-03-14 ENCOUNTER — Other Ambulatory Visit: Payer: Self-pay

## 2020-03-14 ENCOUNTER — Encounter (HOSPITAL_COMMUNITY): Payer: Self-pay

## 2020-03-14 DIAGNOSIS — D869 Sarcoidosis, unspecified: Secondary | ICD-10-CM | POA: Diagnosis not present

## 2020-03-14 DIAGNOSIS — K5190 Ulcerative colitis, unspecified, without complications: Secondary | ICD-10-CM | POA: Diagnosis not present

## 2020-03-14 DIAGNOSIS — C499 Malignant neoplasm of connective and soft tissue, unspecified: Secondary | ICD-10-CM | POA: Diagnosis not present

## 2020-03-14 DIAGNOSIS — Z79899 Other long term (current) drug therapy: Secondary | ICD-10-CM | POA: Diagnosis not present

## 2020-03-14 DIAGNOSIS — M1990 Unspecified osteoarthritis, unspecified site: Secondary | ICD-10-CM | POA: Diagnosis not present

## 2020-03-14 DIAGNOSIS — Z809 Family history of malignant neoplasm, unspecified: Secondary | ICD-10-CM | POA: Insufficient documentation

## 2020-03-14 DIAGNOSIS — Z7982 Long term (current) use of aspirin: Secondary | ICD-10-CM | POA: Insufficient documentation

## 2020-03-14 DIAGNOSIS — I10 Essential (primary) hypertension: Secondary | ICD-10-CM | POA: Insufficient documentation

## 2020-03-14 DIAGNOSIS — G4733 Obstructive sleep apnea (adult) (pediatric): Secondary | ICD-10-CM | POA: Insufficient documentation

## 2020-03-14 DIAGNOSIS — D49512 Neoplasm of unspecified behavior of left kidney: Secondary | ICD-10-CM

## 2020-03-14 DIAGNOSIS — Z7901 Long term (current) use of anticoagulants: Secondary | ICD-10-CM | POA: Diagnosis not present

## 2020-03-14 DIAGNOSIS — I890 Lymphedema, not elsewhere classified: Secondary | ICD-10-CM | POA: Insufficient documentation

## 2020-03-14 DIAGNOSIS — R1909 Other intra-abdominal and pelvic swelling, mass and lump: Secondary | ICD-10-CM | POA: Insufficient documentation

## 2020-03-14 LAB — CBC
HCT: 44.5 % (ref 36.0–46.0)
Hemoglobin: 14.5 g/dL (ref 12.0–15.0)
MCH: 31.4 pg (ref 26.0–34.0)
MCHC: 32.6 g/dL (ref 30.0–36.0)
MCV: 96.3 fL (ref 80.0–100.0)
Platelets: 261 10*3/uL (ref 150–400)
RBC: 4.62 MIL/uL (ref 3.87–5.11)
RDW: 14.2 % (ref 11.5–15.5)
WBC: 3.9 10*3/uL — ABNORMAL LOW (ref 4.0–10.5)
nRBC: 0 % (ref 0.0–0.2)

## 2020-03-14 LAB — PROTIME-INR
INR: 1.1 (ref 0.8–1.2)
Prothrombin Time: 13.8 seconds (ref 11.4–15.2)

## 2020-03-14 MED ORDER — FENTANYL CITRATE (PF) 100 MCG/2ML IJ SOLN
INTRAMUSCULAR | Status: AC
  Filled 2020-03-14: qty 2

## 2020-03-14 MED ORDER — FENTANYL CITRATE (PF) 100 MCG/2ML IJ SOLN
INTRAMUSCULAR | Status: AC | PRN
Start: 2020-03-14 — End: 2020-03-14
  Administered 2020-03-14: 50 ug via INTRAVENOUS

## 2020-03-14 MED ORDER — SODIUM CHLORIDE 0.9 % IV SOLN: INTRAVENOUS | Status: DC

## 2020-03-14 MED ORDER — LIDOCAINE HCL (PF) 1 % IJ SOLN
INTRAMUSCULAR | Status: AC
  Filled 2020-03-14: qty 30

## 2020-03-14 MED ORDER — MIDAZOLAM HCL 2 MG/2ML IJ SOLN
INTRAMUSCULAR | Status: AC
  Filled 2020-03-14: qty 2

## 2020-03-14 MED ORDER — GELATIN ABSORBABLE 12-7 MM EX MISC
CUTANEOUS | Status: AC
  Filled 2020-03-14: qty 1

## 2020-03-14 MED ORDER — SODIUM CHLORIDE 0.9 % IV SOLN
INTRAVENOUS | Status: AC | PRN
  Administered 2020-03-14: 250 mL via INTRAVENOUS

## 2020-03-14 MED ORDER — MIDAZOLAM HCL 2 MG/2ML IJ SOLN
INTRAMUSCULAR | Status: AC | PRN
  Administered 2020-03-14: 1 mg via INTRAVENOUS

## 2020-03-14 NOTE — H&P (Signed)
Chief Complaint: Patient was seen in consultation today for left perinephric mass biopsy at the request of [place]  Referring Physician(s): Slayden  Supervising Physician: Aletta Edouard  Patient Status: [facility] - Out-pt  History of Present Illness: Isabella [name] is a 79 y.o. female    Sarcoidosis; Ulc colitis; OSA; arthritis HTN; Lymphedema  Known lung nodules-- follows with Dr [name] Left perinephric mass -- noted "few yrs ago"-- follows with Dr [name]- no changes  Previous RLL  Lung nodule bx: 09/15/17 Lung, biopsy, RLL - NO MALIGNANCY IDENTIFIED  Follow up CT 12/2019 left perinephric mass enlargement  CT: IMPRESSION: 1. Enlarging fatty lesion in the left perirenal space has features raising concern for retroperitoneal liposarcoma including lack of visible internal enhancing vessels, presence of internal calcifications, growth which is not been totally rounded in nature, and lack of definite extension into the renal parenchyma. Unusual angiomyolipoma is not excluded but there is a substantial possibility that this represents retroperitoneal liposarcoma. 2. 1.9 cm in long axis fluid density lesion along the pancreatic tail, no discrete enhancement, roughly stable from 2019 but not readily seen previous to that. This could be a postinflammatory pancreatic lesion or an intraductal papillary mucinous neoplasm. We have currently established essentially 2 years of stability. Follow up pancreatic protocol MRI with and without contrast is recommended 1 years time.  MRI 03/02/20: IMPRESSION: Image degradation by motion artifact noted. Mixed fat and soft tissue intensity mass in the left lateral perinephric space does not appear to arise from the left renal parenchyma. This remains unchanged in size since recent CT, but does appear mildly increased in size since 2019 PET-CT. Low-grade retroperitoneal liposarcoma cannot be excluded. 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 Recommend continued follow-up by MRI in 1 year  Referred to Dr Alinda Money Now scheduled for left perinephric mass bx Denies pain; urinary symptoms; wt loss  Past Medical History:  Diagnosis Date  . Arthritis   . Complication of anesthesia    Took a while to wake up with knee  . Coronary artery disease    MI 2012, 2019, Dr. [name]  . Dyspnea    with walking at times  . Fallen bladder   . GERD (gastroesophageal reflux disease)   . 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 Headache    migraines in her younger years  . Hemorrhoids   . High cholesterol   . History of blood transfusion   . Hx of adenomatous colonic polyps   . Hypertension   . Lung nodule   . Lymphedema    BLE  . Nocturnal leg cramps   . Obesity   . Sleep apnea    uses cpap    Past Surgical History:  Procedure Laterality Date  . ABDOMINAL HYSTERECTOMY  1990  . APPENDECTOMY  1970  . BACK SURGERY  2015   lower back  . CARDIAC CATHETERIZATION  03/2011  . CARPAL TUNNEL RELEASE Bilateral 2004  . CATARACT EXTRACTION    . CHOLECYSTECTOMY  1970  . COLONOSCOPY W/ BIOPSIES AND POLYPECTOMY    . CORONARY ANGIOPLASTY  2012  . CORONARY STENT PLACEMENT  03/24/2011   Dr. [name]  . EYE SURGERY  2008,2011   corneal transplant  . FEMORAL ARTERY EXPLORATION  03/25/2011   Procedure: FEMORAL ARTERY EXPLORATION;  Surgeon: Elam Dutch, MD;  Location: [facility] OR;  Service: Vascular;  Laterality: Right;  Evacuation of Hematoma   . hematoma surgery     from cardiac cath -hematoma in groin-right  . HERNIA REPAIR  8088   umbilical  . JOINT REPLACEMENT    . KNEE ARTHROSCOPY  2006   Right  . [facility] Surgery Left Eye  2012  . LEFT HEART CATH AND CORONARY ANGIOGRAPHY N/A 01/27/2018   Procedure: LEFT HEART  CATH AND CORONARY ANGIOGRAPHY;  Surgeon: Belva Crome, MD;  Location: [facility] CV LAB;  Service: Cardiovascular;  Laterality: N/A;  . LEFT HEART CATHETERIZATION WITH CORONARY ANGIOGRAM N/A 03/24/2011   Procedure: LEFT HEART CATHETERIZATION WITH CORONARY ANGIOGRAM;  Surgeon: Sinclair Grooms, MD;  Location: [facility] CATH LAB;  Service: Cardiovascular;  Laterality: N/A;  . PERCUTANEOUS CORONARY STENT INTERVENTION (PCI-S) N/A 03/24/2011   Procedure: PERCUTANEOUS CORONARY STENT INTERVENTION (PCI-S);  Surgeon: Sinclair Grooms, MD;  Location: [facility] CATH LAB;  Service: Cardiovascular;  Laterality: N/A;  . SHOULDER SURGERY  2010  . TONSILLECTOMY  1949  . TONSILLECTOMY AND ADENOIDECTOMY    . TOTAL KNEE ARTHROPLASTY  07/2010   Left  . TOTAL KNEE ARTHROPLASTY  06/17/2012   Procedure: TOTAL KNEE ARTHROPLASTY;  Surgeon: Gearlean Alf, MD;  Location: [facility] ORS;  Service: Orthopedics;  Laterality: Right;  . TUBAL LIGATION  1978  . [place], 2007   x 2  . VIDEO BRONCHOSCOPY WITH ENDOBRONCHIAL NAVIGATION  04/13/2012   Procedure: VIDEO BRONCHOSCOPY WITH ENDOBRONCHIAL NAVIGATION;  Surgeon: Melrose Nakayama, MD;  Location: [facility];  Service: Thoracic;  Laterality: N/A;  . VIDEO BRONCHOSCOPY WITH ENDOBRONCHIAL NAVIGATION N/A 04/08/2016   Procedure: VIDEO BRONCHOSCOPY WITH ENDOBRONCHIAL NAVIGATION;  Surgeon: Rigoberto Noel, MD;  Location: [facility] OR;  Service: Thoracic;  Laterality: N/A;    Allergies: Codeine, Imodium a-d [loperamide hcl], Neomycin, Penicillinase, Penicillins, Tramadol, Lisinopril, Hornet venom, Adhesive [tape], Erythromycin, Hydromorphone, and Percocet [oxycodone-acetaminophen]  Medications: Prior to Admission medications   Medication Sig Start Date End Date Taking? Authorizing Provider  aspirin EC 81 MG tablet Take 81 mg by mouth daily at 3 pm.   Yes [provider]  atorvastatin (LIPITOR) 80 MG tablet Take 1 tablet (80 mg total) by mouth daily. 12/27/19  Yes Belva Crome, MD  chlorpheniramine (CHLOR-TRIMETON) 4 MG tablet Take 8 mg by mouth at bedtime.    Yes [provider]  Cholecalciferol (VITAMIN D) 50 MCG (2000 UT) CAPS Take 2,000 Units by mouth daily.   Yes [provider]  furosemide (LASIX) 40 MG tablet Take 40 mg by mouth daily.   Yes [provider]  losartan (COZAAR) 50 MG tablet Take 1 tablet (50 mg total) by mouth daily. 10/18/19  Yes Kathyrn Drown D, NP  mesalamine (APRISO) 0.375 g 24 hr capsule Take 1,500 mg by mouth daily.   Yes [provider]  metoprolol tartrate (LOPRESSOR) 25 MG tablet Take 25 mg by mouth 2 (two) times daily.    Yes [provider]  Multiple Vitamins-Minerals (CENTRUM SILVER 50+WOMEN) TABS Take 1 tablet by mouth daily with supper.    Yes [provider]  nitroGLYCERIN (NITROSTAT) 0.4 MG SL tablet Place 1 tablet (0.4 mg total) under the tongue every 5 (five) minutes as needed for chest pain. 04/14/19  Yes Belva Crome, MD  oxybutynin (DITROPAN-XL) 10 MG 24 hr tablet Take 10 mg by mouth at bedtime.   Yes [provider]  potassium chloride SA (K-DUR,KLOR-CON) 20 MEQ tablet Take 20 mEq by mouth daily with supper.    Yes [provider]  XELJANZ 5 MG TABS Take 5 mg by mouth 2 (two) times daily.   Yes [provider]  acetaminophen (TYLENOL) 500 MG tablet Take 500 mg by mouth every 6 (six) hours as needed for mild pain.    [provider]  UNABLE TO FIND CPAP: At bedtime nightly and during all naps    [provider]     Family History  Problem Relation Age of Onset  . Hypertension Mother   . Cancer Father   . Breast cancer Maternal Grandmother     Social History   Socioeconomic History  . Marital status: Widowed    Spouse name: Not on file  . Number of children: Not on file  . Years of education: Not on file  . Highest education level: Not on file  Occupational History  . Occupation: Retired    Fish farm manager: RETIRED    Tobacco Use  . Smoking status: Never Smoker  . Smokeless tobacco: Never Used  Vaping Use  . Vaping Use: Never used  Substance and Sexual Activity  . Alcohol use: No  . Drug use: No  . Sexual activity: Not on file  Other Topics Concern  . Not on file  Social History Narrative   Tobacco use cigarettes: never smoked, tobacco history last updated 12/23/2013. No smoking. No alcohol. Caffeine: yes, coffee, 3 servings daily.recreational drug use: never. No diet. Exercise: walks everyday. Occupation: retired. Marital status: married. Children: boys, 2 girls, 2. Seat belt use: yes.   Social Determinants of Health   Financial Resource Strain:   . Difficulty of Paying Living Expenses: Not on file  Food Insecurity:   . Worried About Charity fundraiser in the Last Year: Not on file  . Ran Out of Food in the Last Year: Not on file  Transportation Needs:   . Lack of Transportation (Medical): Not on file  . Lack of Transportation (Non-Medical): Not on file  Physical Activity:   . Days of Exercise per Week: Not on file  . Minutes of Exercise per Session: Not on file  Stress:   . Feeling of Stress : Not on file  Social Connections:   . Frequency of Communication with Friends and Family: Not on file  . Frequency of Social Gatherings with Friends and Family: Not on file  . Attends Religious Services: Not on file  . Active Member of Clubs or Organizations: Not on file  . Attends Archivist Meetings: Not on file  . Marital Status: Not on file    Review of Systems: A 12 point ROS discussed and pertinent positives are indicated in the HPI above.  All other systems are negative.  Review of Systems  Constitutional: Negative for activity change, fatigue, fever and unexpected weight change.  Respiratory: Negative for cough and shortness of breath.   Cardiovascular: Negative for chest pain.  Gastrointestinal: Negative for abdominal pain.  Genitourinary: Negative for difficulty urinating,  dysuria and flank pain.  Musculoskeletal: Negative for back pain and gait problem.  Psychiatric/Behavioral: Negative for behavioral problems and confusion.    Vital Signs: BP (!) 172/103   Pulse (!) 51   Temp 97.9 F (36.6 C) (Oral)   Resp 16   Ht 5' 1"  (1.549 m)   Wt 218 lb (98.9 kg)   SpO2 98%   BMI 41.19 kg/m   Physical Exam Vitals  reviewed.  Cardiovascular:     Rate and Rhythm: Normal rate and regular rhythm.     Heart sounds: Normal heart sounds.  Pulmonary:     Effort: Pulmonary effort is normal.     Breath sounds: Normal breath sounds.  Abdominal:     Palpations: Abdomen is soft.     Tenderness: There is no abdominal tenderness.  Musculoskeletal:        General: Normal range of motion.  Skin:    General: Skin is warm.  Neurological:     Mental Status: She is alert and oriented to person, place, and time.  Psychiatric:        Behavior: Behavior normal.     Imaging: MR ABDOMEN WWO CONTRAST  Result Date: 03/02/2020 CLINICAL DATA:  Follow-up left perinephric mass. EXAM: MRI ABDOMEN WITHOUT AND WITH CONTRAST TECHNIQUE: Multiplanar multisequence MR imaging of the abdomen was performed both before and after the administration of intravenous contrast. CONTRAST:  51m GADAVIST GADOBUTROL 1 MMOL/ML IV SOLN COMPARISON:  CT on 01/03/2020 and PET-CT 04/10/2018 FINDINGS: Lower chest: No acute findings. Hepatobiliary: Image degradation by motion artifact noted. No hepatic masses identified. Several small cysts are again seen in both right and lobes. Prior cholecystectomy. No evidence of biliary obstruction. Pancreas: Image degradation by motion artifact noted. A cystic lesion is seen in the pancreatic body measuring 1.6 cm in maximum diameter on image 15/22. A few other tiny less than 5 mm cystic foci are also seen in the pancreatic tail. No evidence of main pancreatic ductal dilatation. Spleen:  Within normal limits in size and appearance. Adrenals/Urinary Tract: Image degradation  by motion artifact noted. Multiple benign-appearing cysts are again seen in both kidneys. A heterogeneous mass is again seen in the left lateral perinephric space which abuts, but does not appear to arise from the left kidney. This mass is predominantly fat attenuation with a central area of soft tissue density which show contrast enhancement on subtraction imaging. The mass is difficult to measure due to its ill-defined margins, however the more central enhancing component measures 5.2 x 2.6 cm on image 44/19, without significant change most recent CT, likely increased in size from approximately 3.9 x 2.3 cm on prior PET-CT. Low-grade retroperitoneal liposarcoma cannot be excluded. Stomach/Bowel: Visualized portion unremarkable. Vascular/Lymphatic: No pathologically enlarged lymph nodes identified. No abdominal aortic aneurysm. Other:  None. Musculoskeletal: No suspicious bone lesions identified. Lumbar spine fusion hardware noted. IMPRESSION: Image degradation by motion artifact noted. Mixed fat and soft tissue intensity mass in the left lateral perinephric space does not appear to arise from the left renal parenchyma. This remains unchanged in size since recent CT, but does appear mildly increased in size since 2019 PET-CT. Low-grade retroperitoneal liposarcoma cannot be excluded. 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 Recommend continued follow-up by MRI in 1 year. This recommendation follows ACR consensus guidelines: Management of Incidental Pancreatic Cysts: A White Paper of the ACR Incidental Findings Committee. Electronically Signed   By: JMarlaine HindM.D.   On: 03/02/2020 15:29    Labs:  CBC: No results for input(s): WBC, HGB, HCT, PLT in the last 8760 hours.  COAGS: No results for input(s): INR, APTT in the last 8760 hours.  BMP: No results for input(s): NA, K, CL, CO2, GLUCOSE,  BUN, CALCIUM, CREATININE, GFRNONAA, GFRAA in the last 8760 hours.  Invalid input(s): CMP  LIVER FUNCTION TESTS: No results for input(s):  BILITOT, AST, ALT, ALKPHOS, PROT, ALBUMIN in the last 8760 hours.  TUMOR MARKERS: No results for input(s): AFPTM, CEA, CA199, CHROMGRNA in the last 8760 hours.  Assessment and Plan:  Left perinephric mass-  Known since 2019-- enlarging per CT/MR Scheduled for biopsy of same Risks and benefits of left perinephric mass biopsy was discussed with the patient and/or patient's family including, but not limited to bleeding, infection, damage to adjacent structures or low yield requiring additional tests.  All of the questions were answered and there is agreement to proceed. Consent signed and in chart.  Thank you for this interesting consult.  I greatly enjoyed meeting Cana W Tingler and look forward to participating in their care.  A copy of this report was sent to the requesting provider on this date.  Electronically Signed: Lavonia Drafts, PA-C 03/14/2020, 7:00 AM   I spent a total of    25 Minutes in face to face in clinical consultation, greater than 50% of which was counseling/coordinating care for left perinephric mass bx

## 2020-03-14 NOTE — Procedures (Signed)
Interventional Radiology Procedure Note  Procedure: US Guided Biopsy of left perinephric/retroperitoneal mass  Complications: None  Estimated Blood Loss: < 10 mL  Findings: 18 G core biopsy of left retroperitoneal mass performed under US guidance.  Four core samples obtained and sent to Pathology.  Venetia Night. Kathlene Cote, M.D Pager:  [phone]

## 2020-03-14 NOTE — Sedation Documentation (Signed)
Attempt made x 1 to call report to Short Stay unit. No available RN's to take report at this time.

## 2020-03-14 NOTE — Discharge Instructions (Addendum)
Percutaneous Kidney Biopsy, Care After This sheet gives you information about how to care for yourself after your procedure. Your health care provider may also give you more specific instructions. If you have problems or questions, contact your health care provider. What can I expect after the procedure? After the procedure, it is common to have:  Pain or soreness near the biopsy site.  Pink or cloudy urine for 24 hours after the procedure. Follow these instructions at home: Activity  Return to your normal activities as told by your health care provider. Ask your health care provider what activities are safe for you.  If you were given a sedative during the procedure, it can affect you for several hours. Do not drive or operate machinery until your health care provider says that it is safe.  Do not lift anything that is heavier than 10 lb (4.5 kg), or the limit that you are told, until your health care provider says that it is safe.  Avoid activities that take a lot of effort (are strenuous) until your health care provider approves. Most people will have to wait 2 weeks before returning to activities such as exercise or sex. General instructions   Take over-the-counter and prescription medicines only as told by your health care provider.  You may eat and drink after your procedure. Follow instructions from your health care provider about eating or drinking restrictions.  You may remove your bandaid in 24 hours. Then you may shower.  Check your biopsy site every day for signs of infection. Check for: ? More redness, swelling, or pain. ? Fluid or blood. ? Warmth. ? Pus or a bad smell.  Keep all follow-up visits as told by your health care provider. This is important. Contact a health care provider if:  You have more redness, swelling, or pain around your biopsy site.  You have fluid or blood coming from your biopsy site.  Your biopsy site feels warm to the touch.  You have pus  or a bad smell coming from your biopsy site.  You have blood in your urine more than 24 hours after your procedure.  You have a fever. Get help right away if:  Your urine is dark red or brown.  You cannot urinate.  It burns when you urinate.  You feel dizzy or light-headed.  You have severe pain in your abdomen or side. Summary  After the procedure, it is common to have pain or soreness at the biopsy site and pink or cloudy urine for the first 24 hours.  Check your biopsy site each day for signs of infection, such as more redness, swelling, or pain; fluid, blood, pus or a bad smell coming from the biopsy site; or the biopsy site feeling warm to touch.  Return to your normal activities as told by your health care provider. This information is not intended to replace advice given to you by your health care provider. Make sure you discuss any questions you have with your health care provider. Document Revised: 12/31/2018 Document Reviewed: 12/31/2018 Elsevier Patient Education  [place].  Moderate Conscious Sedation, Adult, Care After These instructions provide you with information about caring for yourself after your procedure. Your health care provider may also give you more specific instructions. Your treatment has been planned according to current medical practices, but problems sometimes occur. Call your health care provider if you have any problems or questions after your procedure. What can I expect after the procedure? After your procedure, it  is common:  To feel sleepy for several hours.  To feel clumsy and have poor balance for several hours.  To have poor judgment for several hours.  To vomit if you eat too soon. Follow these instructions at home: For at least 24 hours after the procedure:   Do not: ? Participate in activities where you could fall or become injured. ? Drive. ? Use heavy machinery. ? Drink alcohol. ? Take sleeping pills or medicines  that cause drowsiness. ? Make important decisions or sign legal documents. ? Take care of children on your own.  Rest. Eating and drinking  Follow the diet recommended by your health care provider.  If you vomit: ? Drink water, juice, or soup when you can drink without vomiting. ? Make sure you have little or no nausea before eating solid foods. General instructions  Have a responsible adult stay with you until you are awake and alert.  Take over-the-counter and prescription medicines only as told by your health care provider.  If you smoke, do not smoke without supervision.  Keep all follow-up visits as told by your health care provider. This is important. Contact a health care provider if:  You keep feeling nauseous or you keep vomiting.  You feel light-headed.  You develop a rash.  You have a fever. Get help right away if:  You have trouble breathing. This information is not intended to replace advice given to you by your health care provider. Make sure you discuss any questions you have with your health care provider. Document Revised: 04/11/2017 Document Reviewed: 08/19/2015 Elsevier Patient Education  2020 [employer].

## 2020-03-17 LAB — SURGICAL PATHOLOGY

## 2020-03-21 ENCOUNTER — Telehealth: Payer: Self-pay | Admitting: Interventional Cardiology

## 2020-03-21 NOTE — Telephone Encounter (Signed)
   [facility] Pre-operative Risk Assessment    HEARTCARE STAFF: - Please ensure there is not already an duplicate clearance open for this procedure. - Under Visit Info/Reason for Call, type in Other and utilize the format Clearance MM/DD/YY or Clearance TBD. Do not use dashes or single digits. - If request is for dental extraction, please clarify the # of teeth to be extracted.  Request for surgical clearance:  1. What type of surgery is being performed? 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 When is this surgery scheduled? TBD  3. What type of clearance is required (medical clearance vs. Pharmacy clearance to hold med vs. Both)? Both  4. Are there any medications that need to be held prior to surgery and how long? Hold asprin 5 days prior  5. Practice name and name of physician performing surgery? [facility], Dr. Raynelle Bring  6. What is the office phone number? [phone]   7.   What is the office fax number? [phone]  8.   Anesthesia type (None, local, MAC, general) ? general   Selena Zobro 03/21/2020, 2:07 PM  _________________________________________________________________   (provider comments below)

## 2020-03-22 NOTE — Telephone Encounter (Signed)
   Primary Cardiologist: Sinclair Grooms, MD  Chart reviewed as part of pre-operative protocol coverage.   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 Last seen by cardiology at an outpatient visit with Kathyrn Drown, NP 10/2019 and had no anginal complaints at that time.   Dr. [name] - any objections to holding aspirin for upcoming nephrectomy?   Abigail Butts, PA-C 03/22/2020, 11:32 AM

## 2020-03-23 ENCOUNTER — Telehealth: Payer: Self-pay | Admitting: Interventional Cardiology

## 2020-03-23 NOTE — Telephone Encounter (Signed)
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 Her question is, should she take the COVID Booster?

## 2020-03-23 NOTE — Telephone Encounter (Signed)
Spoke with pt and made her aware that we are encouraging the booster shot but she may want to ask the surgeon's office if they feel she should hold off until after the surgery.  She states they are actually the ones that told her to call us.  Advised pt no contraindication for shot from a cardiac standpoint.  Discussed that people can have side effects and some feel bad for a few days after. Pt decided she will hold off on booster until after her surgery.  Pt appreciative for call.

## 2020-03-24 ENCOUNTER — Telehealth: Payer: Self-pay | Admitting: General Practice

## 2020-03-24 NOTE — Telephone Encounter (Signed)
     I went in pt's chart to see who called pt. Denyse Amass was notified of pt's call and will call her back as soon he finish up the call he is on.

## 2020-03-24 NOTE — Telephone Encounter (Signed)
Okay to hold aspirin.

## 2020-03-24 NOTE — Telephone Encounter (Signed)
Patient called 03/24/2020 11:32 AM.  Left detailed voice message and instructed patient to call back.

## 2020-03-24 NOTE — Telephone Encounter (Signed)
   Primary Cardiologist: Sinclair Grooms, MD  Chart reviewed as part of pre-operative protocol coverage. Given past medical history and time since last visit, based on ACC/AHA guidelines, Krislynn W Hyder would be at acceptable risk for the planned procedure without further cardiovascular testing.   Her aspirin may be held for 5 days prior to her procedure.  Please resume as soon as hemostasis is achieved.  Patient was advised that if she develops new symptoms prior to surgery to contact our office to arrange a follow-up appointment.  She verbalized understanding.  I will route this recommendation to the requesting party via Epic fax function and remove from pre-op pool.  Please call with questions.  Jossie Ng. Joey Hudock NP-C    03/24/2020, 1:12 PM [facility] [place] [address] Office [phone] Fax [phone]

## 2020-03-28 ENCOUNTER — Telehealth: Payer: Self-pay | Admitting: Pulmonary Disease

## 2020-03-28 ENCOUNTER — Other Ambulatory Visit: Payer: Self-pay | Admitting: Urology

## 2020-03-28 NOTE — Telephone Encounter (Signed)
I called and left message on VM for [facility] with [facility].  I made them aware that we did in fact receive the form for surgical clearance but the pt has not been seen in our office since 07/03/2018 and that she will need an OV for this form.

## 2020-03-30 ENCOUNTER — Ambulatory Visit: Payer: Medicare Other | Admitting: Pulmonary Disease

## 2020-03-30 ENCOUNTER — Encounter: Payer: Self-pay | Admitting: Pulmonary Disease

## 2020-03-30 ENCOUNTER — Other Ambulatory Visit: Payer: Self-pay

## 2020-03-30 VITALS — BP 122/80 | HR 78 | Temp 98.7°F | Ht 61.0 in | Wt 218.4 lb

## 2020-03-30 DIAGNOSIS — R600 Localized edema: Secondary | ICD-10-CM

## 2020-03-30 DIAGNOSIS — J8410 Pulmonary fibrosis, unspecified: Secondary | ICD-10-CM

## 2020-03-30 DIAGNOSIS — D869 Sarcoidosis, unspecified: Secondary | ICD-10-CM

## 2020-03-30 DIAGNOSIS — G4733 Obstructive sleep apnea (adult) (pediatric): Secondary | ICD-10-CM

## 2020-03-30 DIAGNOSIS — Z7189 Other specified counseling: Secondary | ICD-10-CM | POA: Insufficient documentation

## 2020-03-30 NOTE — Assessment & Plan Note (Signed)
Previously biopsied Followed by [facility] We will continue to clinically follow

## 2020-03-30 NOTE — Assessment & Plan Note (Signed)
°  Peri-operative Assessment of Pulmonary Risk for Non-Thoracic Surgery:  ForMs. Diluzio, moderate risk of perioperative pulmonary complications is increased by:  Age greater than 72 years  Hx of Pulmonary Sarcoid   Obstructive sleep apnea    Respiratory complications generally occur in 1% of ASA Class I patients, 5% of ASA Class II and 10% of ASA Class III-IV patients These complications rarely result in mortality and iclude postoperative pneumonia, atelectasis, pulmonary embolism, ARDS and increased time requiring postoperative mechanical ventilation.  Overall, I recommend proceeding with the surgery if the risk for respiratory complications are outweighed by the potential benefits. This will need to be discussed between the patient and surgeon.  To reduce risks of respiratory complications, I recommend:  --Pre- and post-operative incentive spirometry performed frequently while awake --Inpatient use of currently prescribed positive-pressure for OSA whenever the patient is sleeping --Avoiding use of pancuronium during anesthesia.  I have discussed the risk factors and recommendations above with the patient.

## 2020-03-30 NOTE — Assessment & Plan Note (Signed)
Chronic lower extremity swelling Followed by vascular Encourage patient to continue compression stockings Encourage patient to follow-up with primary care or vascular vein if symptoms worsen

## 2020-03-30 NOTE — Patient Instructions (Addendum)
You were seen today by Lauraine Rinne, NP  for:   1. Surgical counseling visit  Peri-operative Assessment of Pulmonary Risk for Non-Thoracic Surgery:  ForMs. Consolo, moderate risk of perioperative pulmonary complications is increased by:  Age greater than 54 years  Hx of Pulmonary Sarcoid   Obstructive sleep apnea    Respiratory complications generally occur in 1% of ASA Class I patients, 5% of ASA Class II and 10% of ASA Class III-IV patients These complications rarely result in mortality and iclude postoperative pneumonia, atelectasis, pulmonary embolism, ARDS and increased time requiring postoperative mechanical ventilation.  Overall, I recommend proceeding with the surgery if the risk for respiratory complications are outweighed by the potential benefits. This will need to be discussed between the patient and surgeon.  To reduce risks of respiratory complications, I recommend:  --Pre- and post-operative incentive spirometry performed frequently while awake --Inpatient use of currently prescribed positive-pressure for OSA whenever the patient is sleeping --Avoiding use of pancuronium during anesthesia.  I have discussed the risk factors and recommendations above with the patient.   2. Obstructive sleep apnea  We recommend that you continue using your CPAP daily >>>Keep up the hard work using your device >>> Goal should be wearing this for the entire night that you are sleeping, at least 4 to 6 hours  Remember:  . Do not drive or operate heavy machinery if tired or drowsy.  . Please notify the supply company and office if you are unable to use your device regularly due to missing supplies or machine being broken.  . Work on maintaining a healthy weight and following your recommended nutrition plan  . Maintain proper daily exercise and movement  . Maintaining proper use of your device can also help improve management of other chronic illnesses such as: Blood pressure, blood sugars,  and weight management.   BiPAP/ CPAP Cleaning:  >>>Clean weekly, with Dawn soap, and bottle brush.  Set up to air dry. >>> Wipe mask out daily with wet wipe or towelette   3. 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 Elsworth Soho  Keep follow-up with cardiothoracic surgery  5. Edema of lower extremity  Continue current interventions  If you start noticing that you are holding onto more fluid in your lower extremities please follow-up with primary care or Dr. Oneida Alar    Follow Up:    Return in about 3 months (around 06/30/2020), or if symptoms worsen or fail to improve, for Follow up with Dr. Elsworth Soho.   Notification of test results are managed in the following manner: If there are  any recommendations or changes to the  plan of care discussed in office today,  we will contact you and let you know what they are. If you do not hear from [place], then your results are normal and you can view them through your  MyChart account , or a letter will be sent to you. Thank you again for trusting [place] with your care  - Thank you, [facility]    It is flu season:   >>> Best ways to protect herself from the flu: Receive the yearly flu vaccine, practice good hand hygiene washing with soap and also using hand sanitizer when available, eat a nutritious meals, get adequate rest, hydrate appropriately       Please contact the office if your symptoms worsen or you have concerns that you are not improving.  Thank you for choosing [facility] for your healthcare, and for allowing [place] to partner with you on your healthcare journey. I am thankful to be able to provide care to you today.   Wyn Quaker FNP-C

## 2020-03-30 NOTE — Assessment & Plan Note (Signed)
Continue CPAP therapy Utilize CPAP therapy when inpatient

## 2020-03-30 NOTE — Progress Notes (Signed)
@Patient  ID: [name], female    DOB: [birth year], 79 y.o.   MRN: [id number]  Chief Complaint  Patient presents with  . Follow-up    OSA, lung nodule and Sarcoidosis, Surgery clearance    Referring provider: Glenis Smoker, *  HPI:  79 year old female never smoker followed in our office for obstructive sleep apnea and sarcoidosis  PMH: None GERD, hypertension, hyperlipidemia, obesity Smoker/ Smoking History: Never smoker Maintenance: None Pt of: Dr. Elsworth Soho  03/30/2020  - Visit   79 year old female never smoker followed in our office for obstructive sleep apnea and sarcoidosis.  She is established with Dr. Elsworth Soho.  Patient was last seen in our office in February/2020 by Dr. Elsworth Soho.  Plan of care from that office visit was for the patient to obtain pulmonary function testing.  Biopsy the patient previously completed showed granulomatous inflammation.  This can be related to ulcerative colitis or sarcoidosis.  PFTs were read as normal in March/2020.  Patient eventually saw [facility] Dr. [name] in July/2020.  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  It was recommended that she follow-up with tricardiothoracic surgery in 1 year with a CT chest.  Patient completed this follow-up with Dr. [name] on 12/14/2019.  Plan of care from that visit was as follows:  Impression: Isabella [name] is a 79 year old woman with a past medical history significant for multiple lung nodules, sarcoidosis, ulcerative colitis, coronary disease, reflux, sleep apnea, arthritis, hypertension, and lymphedema.    Lung nodules-relatively stable.  Waxing and waning nodules that are consistent with sarcoidosis.  She is not currently on any treatment.  She is not having any symptoms currently.  Needs continued CT follow-up.  We will plan to reevaluate in a year.  Hypertension-blood pressure significantly elevated with a systolic of 366.  She says  she checks her self regularly at home and is within normal limits.  She has whitecoat hypertension.  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  We will check a CT with and without IV contrast as recommended by radiology to further evaluate and see if any additional work-up or biopsy is needed.  Plan: CT of abdomen with and without IV contrast to further evaluate.  perinephric fatty mass. Return in 2 to 3 weeks to discuss results of CT abdomen   Patient presenting today as a follow-up also received pulmonary clearance for an upcoming surgical procedure: Open radical nephrectomy with Dr. Alinda Money with urology.  Patient has no current respiratory complaints.  Overall she feels that she has been doing well.  She is compliant using her CPAP.  CPAP compliance report confirms this see compliance were listed below:  02/29/2020-03/29/2020-30 had a last 30 days use, all 30 those days greater than 4 hours, average usage 7 hours and 40 minutes, CPAP set pressure 12, AHI 7   Questionaires / Pulmonary Flowsheets:   ACT:  No flowsheet data found.  MMRC: mMRC Dyspnea Scale mMRC Score  03/30/2020 1    Epworth:  No flowsheet data found.  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, it does show some enlargement over time and now displays calcifications which are somewhat atypical for angiomyolipoma  07/06/2018-pulmonary function testing-FVC  2.56 (95% predicted), ratio 78, FEV1 1.93 (95% predicted), no bronchodilator response, DLCO 19.83 (107% predicted  PET 11/7937 Hypermetabolic right lower lobe nodule,SUV 4.0 Scattered pulmonary nodules unchanged from 2015, right upper lobe 6  mm nodule SUV 2.0  03/2016 ENB neg  CT chest 07/2017 mild increase in right lower lobe nodule 15 x 16 mm, other nodules all unchanged   PFTs 03/2012 nml  FENO:  No results found for: NITRICOXIDE  PFT: PFT Results Latest Ref Rng & Units 07/06/2018  FVC-Pre L 2.56  FVC-Predicted Pre % 95  FVC-Post L 2.46  FVC-Predicted Post % 91  Pre FEV1/FVC % % 78  Post FEV1/FCV % % 78  FEV1-Pre L 2.01  FEV1-Predicted Pre % 99  FEV1-Post L 1.93  DLCO uncorrected ml/min/mmHg 19.83  DLCO UNC% % 107  DLVA Predicted % 114  TLC L 4.66  TLC % Predicted % 94  RV % Predicted % 98    WALK:  No flowsheet data found.  Imaging: MR ABDOMEN WWO CONTRAST  Result Date: 03/02/2020 CLINICAL DATA:  Follow-up left perinephric mass. EXAM: MRI ABDOMEN WITHOUT AND WITH CONTRAST TECHNIQUE: Multiplanar multisequence MR imaging of the abdomen was performed both before and after the administration of intravenous contrast. CONTRAST:  55m GADAVIST GADOBUTROL 1 MMOL/ML IV SOLN COMPARISON:  CT on 01/03/2020 and PET-CT 04/10/2018 FINDINGS: Lower chest: No acute findings. Hepatobiliary: Image degradation by motion artifact noted. No hepatic masses identified. Several small cysts are again seen in both right and lobes. Prior cholecystectomy. No evidence of biliary obstruction. Pancreas: Image degradation by motion artifact noted. A cystic lesion is seen in the pancreatic body measuring 1.6 cm in maximum diameter on image 15/22. A few other tiny less than 5 mm cystic foci are also seen in the pancreatic tail. No evidence of main pancreatic ductal dilatation. Spleen:  Within normal limits in size and appearance. Adrenals/Urinary Tract: Image degradation by motion artifact noted. Multiple benign-appearing cysts are again seen in both kidneys. A heterogeneous mass is again seen in the left lateral perinephric space which abuts, but does not appear to arise from the left kidney. This mass is predominantly fat attenuation with a  central area of soft tissue density which show contrast enhancement on subtraction imaging. The mass is difficult to measure due to its ill-defined margins, however the more central enhancing component measures 5.2 x 2.6 cm on image 44/19, without significant change most recent CT, likely increased in size from approximately 3.9 x 2.3 cm on prior PET-CT. Low-grade retroperitoneal liposarcoma cannot be excluded. Stomach/Bowel: Visualized portion unremarkable. Vascular/Lymphatic: No pathologically enlarged lymph nodes identified. No abdominal aortic aneurysm. Other:  None. Musculoskeletal: No suspicious bone lesions identified. Lumbar spine fusion hardware noted. IMPRESSION: Image degradation by motion artifact noted. Mixed fat and soft tissue intensity mass in the left lateral perinephric space does not appear to arise from the left renal parenchyma. This remains unchanged in size since recent CT, but does appear mildly increased in size since 2019 PET-CT. Low-grade retroperitoneal liposarcoma cannot be excluded. 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 Recommend continued follow-up by MRI in 1 year. This recommendation follows ACR consensus guidelines: Management of Incidental Pancreatic Cysts: A White Paper of the ACR Incidental Findings Committee. Electronically Signed   By: JMarlaine HindM.D.   On: 03/02/2020 15:29   [place]KIDNEY[place]  Result Date: 03/14/2020 INDICATION: Left-sided retroperitoneal mass in a lateral perinephric  location abutting the left kidney. EXAM: ULTRASOUND GUIDED CORE BIOPSY OF RETROPERITONEAL MASS MEDICATIONS: None. ANESTHESIA/SEDATION: Fentanyl 50 mcg IV; Versed 1.0 mg IV Moderate Sedation Time:  21 minutes. The patient was continuously monitored during the procedure by the interventional radiology nurse under my direct supervision. PROCEDURE: The procedure, risks,  benefits, and alternatives were explained to the patient. Questions regarding the procedure were encouraged and answered. The patient understands and consents to the procedure. A time-out was performed prior to initiating the procedure. Ultrasound was performed of the left retroperitoneal space in a prone position. The left flank region was prepped with chlorhexidine in a sterile fashion, and a sterile drape was applied covering the operative field. A sterile gown and sterile gloves were used for the procedure. Local anesthesia was provided with 1% Lidocaine. A 17 gauge trocar needle was advanced into the left retroperitoneal space lateral to the left kidney. Under ultrasound guidance, coaxial 18 gauge core biopsy samples were obtained. Four samples were submitted in formalin. Gel-Foam pledgets were advanced through the outer needle as the needle was retracted and removed. COMPLICATIONS: None immediate. FINDINGS: The predominantly fat density mass seen by prior CT and MRI in the left lateral perinephric space is visible by ultrasound and predominantly hyperechoic. Fatty appearing tissue was obtained with core biopsy. IMPRESSION: Ultrasound-guided core biopsy performed of a left retroperitoneal mass lying lateral to the left kidney in a perinephric location. Electronically Signed   By: Aletta Edouard M.D.   On: 03/14/2020 13:24    Lab Results:  CBC    Component Value Date/Time   WBC 3.9 (L) 03/14/2020 0620   RBC 4.62 03/14/2020 0620   HGB 14.5 03/14/2020 0620   HCT 44.5 03/14/2020 0620   PLT 261 03/14/2020 0620   MCV 96.3 03/14/2020 0620   MCH 31.4 03/14/2020 0620   MCHC 32.6 03/14/2020 0620   RDW 14.2 03/14/2020 0620   LYMPHSABS 0.4 (L) 03/10/2019 0835   MONOABS 0.5 03/10/2019 0835   EOSABS 0.1 03/10/2019 0835   BASOSABS 0.0 03/10/2019 0835    BMET    Component Value Date/Time   NA 140 03/11/2019 0542   K 3.5 03/11/2019 0542   CL 107 03/11/2019 0542   CO2 25 03/11/2019 0542   GLUCOSE  98 03/11/2019 0542   BUN 12 03/11/2019 0542   CREATININE 0.72 03/11/2019 0542   CREATININE 0.80 08/04/2012 0215   CALCIUM 8.1 (L) 03/11/2019 0542   GFRNONAA >60 03/11/2019 0542   GFRAA >60 03/11/2019 0542    BNP No results found for: BNP  ProBNP No results found for: PROBNP  Specialty Problems      Pulmonary Problems   Obstructive sleep apnea    CPAP 12 cm, new 01/2016      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 Codeine Shortness Of Breath and Other (See Comments)    Made back hurt; does not affect liver per patient but has has caused difficulty breathing/tolerates Percocet only in small doses Back hurts does not affect liver per patient but has has caused difficulty breathing but tolerates Percocet only at small doses  . Imodium A-D [Loperamide Hcl] Shortness Of Breath and Other (See Comments)    Caused a  pain & "squeezing" sensation in the lungs also  . Neomycin Swelling and Other (See Comments)    Reaction to eye ointment - eyes became swollen shut Makes wounds ooze  . Penicillinase Hives, Shortness Of Breath and Rash    Has patient had a PCN reaction causing immediate rash, facial/tongue/throat swelling, SOB or lightheadedness with hypotension: Yes Has patient had a PCN reaction causing severe rash involving mucus membranes or skin necrosis: No Has patient had a PCN reaction that required hospitalization:No Has patient had a PCN reaction occurring within the last 10 years: No If all of the above answers are "NO", then may proceed with Cephalosporin use.   Marland Kitchen Penicillins Hives, Shortness Of Breath and Rash    Reaction @ 18-yrs old Has patient had a PCN reaction causing immediate rash, facial/tongue/throat swelling, SOB or lightheadedness with hypotension:  Yes Has patient had a PCN reaction causing severe rash involving mucus membranes or skin necrosis: No Has patient had a PCN reaction that required hospitalization: No Has patient had a PCN reaction occurring within the last 10 years: No If all of the above answers are "NO", then may proceed with Cephalosporin use.  . Tramadol Shortness Of Breath and Other (See Comments)    (Per patient) "felt like lungs were being squeezed"   . Lisinopril Cough  . Hornet Venom Swelling    Swelling at site of sting  . Adhesive [Tape]     Tears the skin, paper tape is ok  . Erythromycin Diarrhea  . Hydromorphone Other (See Comments)    Severe Lethargy    . Percocet [Oxycodone-Acetaminophen] Other (See Comments)    Confusion     Immunization History  Administered Date(s) Administered  . Influenza, High Dose Seasonal PF 03/29/2016, 01/31/2017, 01/29/2018  . Influenza,inj,Quad PF,6+ Mos 03/29/2013, 04/06/2014, 05/14/2015  . Influenza-Unspecified 03/13/2020  . Moderna SARS-COVID-2 Vaccination 03/24/2020  . PFIZER SARS-COV-2 Pediatric Vaccination 06/24/2019, 07/19/2019  . Pneumococcal Conjugate-13 10/11/2013    Past Medical History:  Diagnosis Date  . Arthritis   . Complication of anesthesia    Took a while to wake up with knee  . Coronary artery disease    MI 2012, 2019, Dr. [name]  . Dyspnea    with walking at times  . Fallen bladder   . GERD (gastroesophageal reflux disease)   . 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 Headache    migraines in her younger years  . Hemorrhoids   . High cholesterol   . History of blood transfusion   . Hx of adenomatous colonic polyps   . Hypertension   . Lung nodule   . Lymphedema    BLE  . Nocturnal leg cramps   . Obesity   . Sleep apnea    uses cpap    Tobacco History: Social History   Tobacco Use  Smoking Status Never Smoker  Smokeless Tobacco Never  Used   Counseling given: Not Answered   Continue to not smoke  Outpatient Encounter Medications as of 03/30/2020  Medication Sig  . acetaminophen (TYLENOL) 500 MG tablet Take 500 mg by mouth every 6 (six) hours as needed for mild pain.  Marland Kitchen aspirin EC 81 MG tablet Take 81 mg by mouth daily at 3 pm.  . atorvastatin (LIPITOR) 80 MG tablet Take 1 tablet (80 mg total) by mouth daily.  . chlorpheniramine (CHLOR-TRIMETON) 4 MG tablet Take 8 mg  by mouth at bedtime.   . Cholecalciferol (VITAMIN D) 50 MCG (2000 UT) CAPS Take 2,000 Units by mouth daily.  . furosemide (LASIX) 40 MG tablet Take 40 mg by mouth daily.  Marland Kitchen losartan (COZAAR) 50 MG tablet Take 1 tablet (50 mg total) by mouth daily.  . mesalamine (APRISO) 0.375 g 24 hr capsule Take 1,500 mg by mouth daily.  . metoprolol tartrate (LOPRESSOR) 25 MG tablet Take 25 mg by mouth 2 (two) times daily.   . Multiple Vitamins-Minerals (CENTRUM SILVER 50+WOMEN) TABS Take 1 tablet by mouth daily with supper.   . nitroGLYCERIN (NITROSTAT) 0.4 MG SL tablet Place 1 tablet (0.4 mg total) under the tongue every 5 (five) minutes as needed for chest pain.  Marland Kitchen oxybutynin (DITROPAN-XL) 10 MG 24 hr tablet Take 10 mg by mouth at bedtime.  . potassium chloride SA (K-DUR,KLOR-CON) 20 MEQ tablet Take 20 mEq by mouth daily with supper.   Marland Kitchen UNABLE TO FIND CPAP: At bedtime nightly and during all naps  . XELJANZ 5 MG TABS Take 5 mg by mouth 2 (two) times daily.   No facility-administered encounter medications on file as of 03/30/2020.     Review of Systems  Review of Systems  Constitutional: Negative for activity change, fatigue and fever.  HENT: Negative for sinus pressure, sinus pain and sore throat.   Respiratory: Negative for cough, shortness of breath and wheezing.   Cardiovascular: Negative for chest pain and palpitations.  Gastrointestinal: Negative for diarrhea, nausea and vomiting.  Musculoskeletal: Negative for arthralgias.  Neurological: Negative for  dizziness.  Psychiatric/Behavioral: Negative for sleep disturbance. The patient is not nervous/anxious.      Physical Exam  BP 122/80 (BP Location: Left Arm, Cuff Size: Normal)   Pulse 78   Temp 98.7 F (37.1 C) (Oral)   Ht 5' 1"  (1.549 m)   Wt 218 lb 6.4 oz (99.1 kg)   SpO2 97%   BMI 41.27 kg/m   Wt Readings from Last 5 Encounters:  03/30/20 218 lb 6.4 oz (99.1 kg)  03/14/20 218 lb (98.9 kg)  01/04/20 222 lb 6.4 oz (100.9 kg)  12/14/19 218 lb (98.9 kg)  10/12/19 218 lb 12.8 oz (99.2 kg)    BMI Readings from Last 5 Encounters:  03/30/20 41.27 kg/m  03/14/20 41.19 kg/m  01/04/20 42.02 kg/m  12/14/19 40.52 kg/m  10/12/19 40.67 kg/m     Physical Exam Vitals and nursing note reviewed.  Constitutional:      General: She is not in acute distress.    Appearance: Normal appearance. She is obese.  HENT:     Head: Normocephalic and atraumatic.     Right Ear: External ear normal.     Left Ear: External ear normal.     Nose: Nose normal. No congestion.     Mouth/Throat:     Mouth: Mucous membranes are moist.     Pharynx: Oropharynx is clear.  Eyes:     Pupils: Pupils are equal, round, and reactive to light.  Cardiovascular:     Rate and Rhythm: Normal rate and regular rhythm.     Pulses: Normal pulses.     Heart sounds: Normal heart sounds. No murmur heard.   Pulmonary:     Effort: Pulmonary effort is normal. No respiratory distress.     Breath sounds: Normal breath sounds. No decreased air movement. No decreased breath sounds, wheezing or rales.  Musculoskeletal:     Cervical back: Normal range of motion.  Skin:  General: Skin is warm and dry.     Capillary Refill: Capillary refill takes less than 2 seconds.  Neurological:     General: No focal deficit present.     Mental Status: She is alert and oriented to person, place, and time. Mental status is at baseline.     Gait: Gait normal.  Psychiatric:        Mood and Affect: Mood normal.        Behavior:  Behavior normal.        Thought Content: Thought content normal.        Judgment: Judgment normal.       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 Gueye, moderate risk of perioperative pulmonary complications is increased by:  Age greater than 44 years  Hx of Pulmonary Sarcoid   Obstructive sleep apnea    Respiratory complications generally occur in 1% of ASA Class I patients, 5% of ASA Class II and 10% of ASA Class III-IV patients These complications rarely result in mortality and iclude postoperative pneumonia, atelectasis, pulmonary embolism, ARDS and increased time requiring postoperative mechanical ventilation.  Overall, I recommend proceeding with the surgery if the risk for respiratory complications are outweighed by the potential benefits. This will need to be discussed between the patient and surgeon.  To reduce risks of respiratory complications, I recommend:  --Pre- and post-operative incentive spirometry performed frequently while awake --Inpatient use of currently prescribed positive-pressure for OSA whenever the patient is sleeping --Avoiding use of pancuronium during anesthesia.  I have discussed the risk factors and recommendations above with the patient.      Return in about 3 months (around 06/30/2020), or if symptoms worsen or fail to improve, for Follow up with Dr. Elsworth Soho.   Lauraine Rinne,  NP 03/30/2020   This appointment required 34 minutes of patient care (this includes precharting, chart review, review of results, face-to-face care, etc.).

## 2020-03-30 NOTE — Assessment & Plan Note (Signed)
Previously biopsied granulomas We will continue clinically monitor

## 2020-04-11 ENCOUNTER — Other Ambulatory Visit: Payer: Self-pay | Admitting: Cardiology

## 2020-04-12 NOTE — Telephone Encounter (Signed)
rx refill

## 2020-04-17 NOTE — Patient Instructions (Addendum)
DUE TO COVID-19 ONLY ONE VISITOR IS ALLOWED TO COME WITH YOU AND STAY IN THE WAITING ROOM ONLY DURING PRE OP AND PROCEDURE.   IF YOU WILL BE ADMITTED INTO THE HOSPITAL YOU ARE ALLOWED ONE SUPPORT PERSON DURING VISITATION HOURS ONLY (10AM -8PM)   . The support person may change daily. . The support person must pass our screening, gel in and out, and wear a mask at all times, including in the patient's room. . Patients must also wear a mask when staff or their support person are in the room.   COVID SWAB TESTING MUST BE COMPLETED ON:   Thursday, 04-20-20 @ 2:05 PM   [address] [place], [place] [address]  (Must self quarantine after testing. Follow instructions on handout.)  Elim Entrance [place] (Must self quarantine after testing. Follow instructions on handout.)       Your procedure is scheduled on:  Monday, 04-24-20   Report to [facility] Main  Entrance    Report to admitting at 10:30 AM   Call this number if you have problems the morning of surgery [phone]    Half bottle of Magnesium Citrate at noon the day before surgery.   Do not eat food :After Midnight.   May have liquids until 9:30 AM day of surgery  CLEAR LIQUID DIET  Foods Allowed                                                                     Foods Excluded  Water, Black Coffee and tea, regular and decaf              liquids that you cannot  Plain Jell-O in any flavor  (No red)                                     see through such as: Fruit ices (not with fruit pulp)                                      milk, soups, orange juice              Iced Popsicles (No red)                                      All solid food                                   Apple juices Sports drinks like Gatorade (No red) Lightly seasoned clear broth or consume(fat free) Sugar, honey syrup   Oral Hygiene is also important to reduce your risk of infection.                                     Remember - BRUSH YOUR TEETH THE MORNING OF SURGERY WITH YOUR  REGULAR TOOTHPASTE   Do NOT smoke after Midnight   Take these medicines the morning of surgery with A SIP OF WATER: Metoprolol                               You may not have any metal on your body including hair pins, jewelry, and body piercings             Do not wear make-up, lotions, powders, perfumes/cologne, or deodorant             Do not wear nail polish.  Do not shave  48 hours prior to surgery.            Do not bring valuables to the hospital. [place].   Contacts, dentures or bridgework may not be worn into surgery.   Bring small overnight bag day of surgery.    Special Instructions: Bring a copy of your healthcare power of attorney and living will documents         the day of surgery if you haven't scanned them in before.              Please read over the following fact sheets you were given: IF YOU HAVE QUESTIONS ABOUT YOUR PRE OP INSTRUCTIONS PLEASE CALL [phone]   [place] - Preparing for Surgery Before surgery, you can play an important role.  Because skin is not sterile, your skin needs to be as free of germs as possible.  You can reduce the number of germs on your skin by washing with CHG (chlorahexidine gluconate) soap before surgery.  CHG is an antiseptic cleaner which kills germs and bonds with the skin to continue killing germs even after washing. Please DO NOT use if you have an allergy to CHG or antibacterial soaps.  If your skin becomes reddened/irritated stop using the CHG and inform your nurse when you arrive at Short Stay. Do not shave (including legs and underarms) for at least 48 hours prior to the first CHG shower.  You may shave your face/neck.  Please follow these instructions carefully:  1.  Shower with CHG Soap the night before surgery and the  morning of surgery.  2.  If you choose to wash your hair, wash your hair first  as usual with your normal  shampoo.  3.  After you shampoo, rinse your hair and body thoroughly to remove the shampoo.                             4.  Use CHG as you would any other liquid soap.  You can apply chg directly to the skin and wash.  Gently with a scrungie or clean washcloth.  5.  Apply the CHG Soap to your body ONLY FROM THE NECK DOWN.   Do   not use on face/ open                           Wound or open sores. Avoid contact with eyes, ears mouth and   genitals (private parts).                       Wash face,  Genitals (private parts) with your normal soap.  6.  Wash thoroughly, paying special attention to the area where your    surgery  will be performed.  7.  Thoroughly rinse your body with warm water from the neck down.  8.  DO NOT shower/wash with your normal soap after using and rinsing off the CHG Soap.                9.  Pat yourself dry with a clean towel.            10.  Wear clean pajamas.            11.  Place clean sheets on your bed the night of your first shower and do not  sleep with pets. Day of Surgery : Do not apply any lotions/deodorants the morning of surgery.  Please wear clean clothes to the hospital/surgery center.  FAILURE TO FOLLOW THESE INSTRUCTIONS MAY RESULT IN THE CANCELLATION OF YOUR SURGERY  PATIENT SIGNATURE_________________________________  NURSE SIGNATURE__________________________________  ________________________________________________________________________   Isabella [name]  An incentive spirometer is a tool that can help keep your lungs clear and active. This tool measures how well you are filling your lungs with each breath. Taking long deep breaths may help reverse or decrease the chance of developing breathing (pulmonary) problems (especially infection) following:  A long period of time when you are unable to move or be active. BEFORE THE PROCEDURE   If the spirometer includes an indicator to show your best effort,  your nurse or respiratory therapist will set it to a desired goal.  If possible, sit up straight or lean slightly forward. Try not to slouch.  Hold the incentive spirometer in an upright position. INSTRUCTIONS FOR USE  1. Sit on the edge of your bed if possible, or sit up as far as you can in bed or on a chair. 2. Hold the incentive spirometer in an upright position. 3. Breathe out normally. 4. Place the mouthpiece in your mouth and seal your lips tightly around it. 5. Breathe in slowly and as deeply as possible, raising the piston or the ball toward the top of the column. 6. Hold your breath for 3-5 seconds or for as long as possible. Allow the piston or ball to fall to the bottom of the column. 7. Remove the mouthpiece from your mouth and breathe out normally. 8. Rest for a few seconds and repeat Steps 1 through 7 at least 10 times every 1-2 hours when you are awake. Take your time and take a few normal breaths between deep breaths. 9. The spirometer may include an indicator to show your best effort. Use the indicator as a goal to work toward during each repetition. 10. After each set of 10 deep breaths, practice coughing to be sure your lungs are clear. If you have an incision (the cut made at the time of surgery), support your incision when coughing by placing a pillow or rolled up towels firmly against it. Once you are able to get out of bed, walk around indoors and cough well. You may stop using the incentive spirometer when instructed by your caregiver.  RISKS AND COMPLICATIONS  Take your time so you do not get dizzy or light-headed.  If you are in pain, you may need to take or ask for pain medication before doing incentive spirometry. It is harder to take a deep breath if you are having pain. AFTER USE  Rest and breathe slowly and easily.  It can be helpful to keep track of a  log of your progress. Your caregiver can provide you with a simple table to help with this. If you are  using the spirometer at home, follow these instructions: [place] IF:   You are having difficultly using the spirometer.  You have trouble using the spirometer as often as instructed.  Your pain medication is not giving enough relief while using the spirometer.  You develop fever of 100.5 F (38.1 C) or higher. SEEK IMMEDIATE MEDICAL CARE IF:   You cough up bloody sputum that had not been present before.  You develop fever of 102 F (38.9 C) or greater.  You develop worsening pain at or near the incision site. MAKE SURE YOU:   Understand these instructions.  Will watch your condition.  Will get help right away if you are not doing well or get worse. Document Released: 09/09/2006 Document Revised: 07/22/2011 Document Reviewed: 11/10/2006 ExitCare Patient Information 2014 Exit[employer], [place].   ________________________________________________________________________  WHAT IS A BLOOD TRANSFUSION? Blood Transfusion Information  A transfusion is the replacement of blood or some of its parts. Blood is made up of multiple cells which provide different functions.  Red blood cells carry oxygen and are used for blood loss replacement.  White blood cells fight against infection.  Platelets control bleeding.  Plasma helps clot blood.  Other blood products are available for specialized needs, such as hemophilia or other clotting disorders. BEFORE THE TRANSFUSION  Who gives blood for transfusions?   Healthy volunteers who are fully evaluated to make sure their blood is safe. This is blood bank blood. Transfusion therapy is the safest it has ever been in the practice of medicine. Before blood is taken from a donor, a complete history is taken to make sure that person has no history of diseases nor engages in risky social behavior (examples are intravenous drug use or sexual activity with multiple partners). The donor's travel history is screened to minimize risk of transmitting  infections, such as malaria. The donated blood is tested for signs of infectious diseases, such as HIV and hepatitis. The blood is then tested to be sure it is compatible with you in order to minimize the chance of a transfusion reaction. If you or a relative donates blood, this is often done in anticipation of surgery and is not appropriate for emergency situations. It takes many days to process the donated blood. RISKS AND COMPLICATIONS Although transfusion therapy is very safe and saves many lives, the main dangers of transfusion include:   Getting an infectious disease.  Developing a transfusion reaction. This is an allergic reaction to something in the blood you were given. Every precaution is taken to prevent this. The decision to have a blood transfusion has been considered carefully by your caregiver before blood is given. Blood is not given unless the benefits outweigh the risks. AFTER THE TRANSFUSION  Right after receiving a blood transfusion, you will usually feel much better and more energetic. This is especially true if your red blood cells have gotten low (anemic). The transfusion raises the level of the red blood cells which carry oxygen, and this usually causes an energy increase.  The nurse administering the transfusion will monitor you carefully for complications. HOME CARE INSTRUCTIONS  No special instructions are needed after a transfusion. You may find your energy is better. Speak with your caregiver about any limitations on activity for underlying diseases you may have. SEEK MEDICAL CARE IF:   Your condition is not improving after your transfusion.  You  develop redness or irritation at the intravenous (IV) site. SEEK IMMEDIATE MEDICAL CARE IF:  Any of the following symptoms occur over the next 12 hours:  Shaking chills.  You have a temperature by mouth above 102 F (38.9 C), not controlled by medicine.  Chest, back, or muscle pain.  People around you feel you are  not acting correctly or are confused.  Shortness of breath or difficulty breathing.  Dizziness and fainting.  You get a rash or develop hives.  You have a decrease in urine output.  Your urine turns a dark color or changes to pink, red, or brown. Any of the following symptoms occur over the next 10 days:  You have a temperature by mouth above 102 F (38.9 C), not controlled by medicine.  Shortness of breath.  Weakness after normal activity.  The white part of the eye turns yellow (jaundice).  You have a decrease in the amount of urine or are urinating less often.  Your urine turns a dark color or changes to pink, red, or brown. Document Released: 04/26/2000 Document Revised: 07/22/2011 Document Reviewed: 12/14/2007 [facility] Patient Information 2014 [place], [place].  _______________________________________________________________________

## 2020-04-17 NOTE — Progress Notes (Signed)
COVID Vaccine Completed:  x2 Date COVID Vaccine completed:  06-24-19 & 07-19-19 COVID vaccine manufacturer: [employer]   PCP - Sela Hilding, MD Cardiologist - Daneen Schick, MD  Cardiac clearance in Epic dated 03-21-20 from Coletta Memos, NP-C  Chest x-ray - CT chest 12-09-19 in Epic EKG -  10-12-19 in Epic Stress Test -  ECHO - 01-27-18 in Epic Cardiac Cath - 01-27-18 in Epic Pacemaker/ICD device last checked:  Sleep Study - +sleep apnea CPAP - Yes  Fasting Blood Sugar -  Checks Blood Sugar _____ times a day  Blood Thinner Instructions: Aspirin Instructions:  ASA 81 mg.  Ok to hold 5 days prior to procedure per clearance dated 03-21-20 Last Dose:  Anesthesia review: CAD, Hx of MI with stent placement, pulmonary sarcoidosis, CHF, HTN, OSA  Patient denies shortness of breath, fever, cough and chest pain at PAT appointment.  Patient unable to climb a flight of stairs due to back pain.  Able to perform housework and perform ADLs.   Patient verbalized understanding of instructions that were given to them at the PAT appointment. Patient was also instructed that they will need to review over the PAT instructions again at home before surgery.

## 2020-04-18 ENCOUNTER — Other Ambulatory Visit: Payer: Self-pay

## 2020-04-18 ENCOUNTER — Encounter (HOSPITAL_COMMUNITY): Payer: Self-pay

## 2020-04-18 ENCOUNTER — Encounter (HOSPITAL_COMMUNITY)
Admission: RE | Admit: 2020-04-18 | Discharge: 2020-04-18 | Disposition: A | Discharge status: Home / Self Care | Payer: Medicare Other | Source: Ambulatory Visit | Attending: Urology | Admitting: Urology

## 2020-04-18 DIAGNOSIS — Z79899 Other long term (current) drug therapy: Secondary | ICD-10-CM | POA: Insufficient documentation

## 2020-04-18 DIAGNOSIS — I10 Essential (primary) hypertension: Secondary | ICD-10-CM | POA: Insufficient documentation

## 2020-04-18 DIAGNOSIS — Z7901 Long term (current) use of anticoagulants: Secondary | ICD-10-CM | POA: Diagnosis not present

## 2020-04-18 DIAGNOSIS — C480 Malignant neoplasm of retroperitoneum: Secondary | ICD-10-CM | POA: Diagnosis not present

## 2020-04-18 DIAGNOSIS — Z7982 Long term (current) use of aspirin: Secondary | ICD-10-CM | POA: Insufficient documentation

## 2020-04-18 DIAGNOSIS — G4733 Obstructive sleep apnea (adult) (pediatric): Secondary | ICD-10-CM | POA: Insufficient documentation

## 2020-04-18 DIAGNOSIS — Z01812 Encounter for preprocedural laboratory examination: Secondary | ICD-10-CM | POA: Insufficient documentation

## 2020-04-18 DIAGNOSIS — I2510 Atherosclerotic heart disease of native coronary artery without angina pectoris: Secondary | ICD-10-CM | POA: Insufficient documentation

## 2020-04-18 HISTORY — DX: Acute myocardial infarction, unspecified: I21.9

## 2020-04-18 LAB — SURGICAL PCR SCREEN
MRSA, PCR: NEGATIVE
Staphylococcus aureus: NEGATIVE

## 2020-04-18 LAB — CBC
HCT: 41.4 % (ref 36.0–46.0)
Hemoglobin: 13.7 g/dL (ref 12.0–15.0)
MCH: 31.8 pg (ref 26.0–34.0)
MCHC: 33.1 g/dL (ref 30.0–36.0)
MCV: 96.1 fL (ref 80.0–100.0)
Platelets: 217 10*3/uL (ref 150–400)
RBC: 4.31 MIL/uL (ref 3.87–5.11)
RDW: 14.4 % (ref 11.5–15.5)
WBC: 4.6 10*3/uL (ref 4.0–10.5)
nRBC: 0 % (ref 0.0–0.2)

## 2020-04-18 LAB — BASIC METABOLIC PANEL
Anion gap: 8 (ref 5–15)
BUN: 18 mg/dL (ref 8–23)
CO2: 27 mmol/L (ref 22–32)
Calcium: 8.8 mg/dL — ABNORMAL LOW (ref 8.9–10.3)
Chloride: 105 mmol/L (ref 98–111)
Creatinine, Ser: 0.72 mg/dL (ref 0.44–1.00)
GFR, Estimated: >60 mL/min (ref >60)
Glucose, Bld: 103 mg/dL — ABNORMAL HIGH (ref 70–99)
Potassium: 3.9 mmol/L (ref 3.5–5.1)
Sodium: 140 mmol/L (ref 135–145)

## 2020-04-19 NOTE — Progress Notes (Signed)
Anesthesia Chart Review   Case: [id number] Date/Time: 04/24/20 1220   Procedure: OPEN RADICAL NEPHRECTOMY WITH RESECTION OF PERITONEAL MASS (Left )   Anesthesia type: General   Pre-op diagnosis: RETROPERITONEAL LIPOSARCOMA   [facility] ROOM 03 / [facility]   Surgeons: Raynelle Bring, MD      DISCUSSION:79 y.o. never smoker with h/o HTN, GERD, CAD, sleep apnea uses CPAP, sarcoidosis, retroperitoneal liposarcoma scheduled for above procedure 04/24/2020 with Dr. Raynelle Bring.   Pt last seen by pulmonologist 03/30/20. Per OV note, "Overall, I recommend proceeding with the surgery if the risk for respiratory complications are outweighed by the potential benefits. This will need to be discussed between the patient and surgeon. To reduce risks of respiratory complications, I recommend: --Pre- and post-operative incentive spirometry performed frequently while awake --Inpatient use of currently prescribed positive-pressure for OSA whenever the patient is sleeping --Avoiding use of pancuronium during anesthesia."  Per cardiology preoperative risk assessment 03/24/2020, "Chart reviewed as part of pre-operative protocol coverage. Given past medical history and time since last visit, based on ACC/AHA guidelines, Isabella [name] would be at acceptable risk for the planned procedure without further cardiovascular testing.  Her aspirin may be held for 5 days prior to her procedure.  Please resume as soon as hemostasis is achieved."  Anticipate pt can proceed with planned procedure barring acute status change.   VS: BP (!) 166/69   Pulse (!) 50   Temp 36.8 C (Oral)   Resp 18   Ht 5' 1"  (1.549 m)   Wt 99.9 kg   SpO2 95%   BMI 41.60 kg/m   PROVIDERS: Glenis Smoker, MD is PCP   Daneen Schick, MD is Cardiologist  LABS: Labs reviewed: Acceptable for surgery. (all labs ordered are listed, but only abnormal results are displayed)  Labs Reviewed  BASIC METABOLIC PANEL - Abnormal; Notable for the  following components:      Result Value   Glucose, Bld 103 (*)    Calcium 8.8 (*)    All other components within normal limits  SURGICAL PCR SCREEN  CBC  TYPE AND SCREEN     IMAGES:   EKG: 10/12/2019 Rate 57 bpm  NSR  CV: Echo 01/27/2018 Study Conclusions   - Left ventricle: The cavity size was normal. Systolic function was  normal. The estimated ejection fraction was in the range of 60%  to 65%. Wall motion was normal; there were no regional wall  motion abnormalities. Doppler parameters are consistent with  abnormal left ventricular relaxation (grade 1 diastolic  dysfunction). Doppler parameters are consistent with  indeterminate ventricular filling pressure.  - Aortic valve: Transvalvular velocity was within the normal range.  There was no stenosis. There was no regurgitation.  - Mitral valve: Transvalvular velocity was within the normal range.  There was no evidence for stenosis. There was mild regurgitation.  - Left atrium: The atrium was moderately dilated.  - Right ventricle: The cavity size was normal. Wall thickness was  normal. Systolic function was normal.  - Tricuspid valve: There was mild regurgitation.  - Pulmonary arteries: Systolic pressure was within the normal  range. PA peak pressure: 24 mm Hg (S).  Cardiac Cath 01/27/2018  Normal left main.  Large widely patent LAD without obstruction.  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  TIMI grade III flow noted.  Large dominant right coronary without obstructive disease. The continuation of the right coronary  which was the site of SCAD in 2012 has completely healed and has normal appearance.  Widely patent ostial RCA stent which was placed due to catheter induced coronary dissection 2012.  Overall normal LV function with EF 55%.  Mildly elevated LVEDP.  Past Medical History:  Diagnosis  Date  . Arthritis   . Complication of anesthesia    Took a while to wake up with knee. Avoid pancuronium  . Coronary artery disease    MI 2012, 2019, Dr. [name]  . Dyspnea    with walking at times  . Fallen bladder   . GERD (gastroesophageal reflux disease)    No problems currently  . 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 Headache    migraines in her younger years  . Hemorrhoids   . High cholesterol   . History of blood transfusion   . Hx of adenomatous colonic polyps   . Hypertension   . Lung nodule   . Lymphedema    BLE  . Myocardial infarction [facility])    x2 2012 and 2019  . Nocturnal leg cramps   . Obesity   . Sleep apnea    uses cpap    Past Surgical History:  Procedure Laterality Date  . ABDOMINAL HYSTERECTOMY  1990  . APPENDECTOMY  1970  . BACK SURGERY  2015   lower back  . CARDIAC CATHETERIZATION  03/2011  . CARPAL TUNNEL RELEASE Bilateral 2004  . CATARACT EXTRACTION    . CHOLECYSTECTOMY  1970  . COLONOSCOPY W/ BIOPSIES AND POLYPECTOMY    . CORONARY ANGIOPLASTY  2012  . CORONARY STENT PLACEMENT  03/24/2011   Dr. [name]  . EYE SURGERY  2008,2011   corneal transplant  . FEMORAL ARTERY EXPLORATION  03/25/2011   Procedure: FEMORAL ARTERY EXPLORATION;  Surgeon: Elam Dutch, MD;  Location: [place];  Service: Vascular;  Laterality: Right;  Evacuation of Hematoma   . hematoma surgery     from cardiac cath -hematoma in groin-right  . HERNIA REPAIR  0177   umbilical  . JOINT REPLACEMENT    . KNEE ARTHROSCOPY  2006   Right  . [place] Surgery Left Eye  2012  . LEFT HEART CATH AND CORONARY ANGIOGRAPHY N/A 01/27/2018   Procedure: LEFT HEART CATH AND CORONARY ANGIOGRAPHY;  Surgeon: Belva Crome, MD;  Location: [place] CV LAB;  Service: Cardiovascular;  Laterality: N/A;  . LEFT HEART CATHETERIZATION WITH CORONARY ANGIOGRAM N/A 03/24/2011   Procedure: LEFT HEART  CATHETERIZATION WITH CORONARY ANGIOGRAM;  Surgeon: Sinclair Grooms, MD;  Location: [facility] CATH LAB;  Service: Cardiovascular;  Laterality: N/A;  . PERCUTANEOUS CORONARY STENT INTERVENTION (PCI-S) N/A 03/24/2011   Procedure: PERCUTANEOUS CORONARY STENT INTERVENTION (PCI-S);  Surgeon: Sinclair Grooms, MD;  Location: [facility] CATH LAB;  Service: Cardiovascular;  Laterality: N/A;  . SHOULDER SURGERY  2010  . TONSILLECTOMY  1949  . TONSILLECTOMY AND ADENOIDECTOMY    . TOTAL KNEE ARTHROPLASTY  07/2010   Left  . TOTAL KNEE ARTHROPLASTY  06/17/2012   Procedure: TOTAL KNEE ARTHROPLASTY;  Surgeon: Gearlean Alf, MD;  [facility];  Service: Orthopedics;  Laterality: Right;  . TUBAL LIGATION  1978  . [place], 2007   x 2  . VIDEO BRONCHOSCOPY WITH ENDOBRONCHIAL NAVIGATION  04/13/2012   Procedure: VIDEO BRONCHOSCOPY WITH ENDOBRONCHIAL NAVIGATION;  Surgeon: Melrose Nakayama, MD;  [facility];  Service: Thoracic;  Laterality: N/A;  . VIDEO BRONCHOSCOPY WITH ENDOBRONCHIAL NAVIGATION N/A 04/08/2016   Procedure: VIDEO BRONCHOSCOPY WITH ENDOBRONCHIAL NAVIGATION;  Surgeon: Rigoberto Noel, MD;  Location: [facility];  Service: Thoracic;  Laterality: N/A;    MEDICATIONS: . acetaminophen (TYLENOL) 500 MG tablet  . aspirin EC 81 MG tablet  . atorvastatin (LIPITOR) 80 MG tablet  . chlorpheniramine (CHLOR-TRIMETON) 4 MG tablet  . Cholecalciferol (VITAMIN D) 50 MCG (2000 UT) CAPS  . furosemide (LASIX) 40 MG tablet  . losartan (COZAAR) 50 MG tablet  . mesalamine (APRISO) 0.375 g 24 hr capsule  . metoprolol tartrate (LOPRESSOR) 25 MG tablet  . Multiple Vitamins-Minerals (CENTRUM SILVER 50+WOMEN) TABS  . nitroGLYCERIN (NITROSTAT) 0.4 MG SL tablet  . oxybutynin (DITROPAN-XL) 10 MG 24 hr tablet  . potassium chloride SA (K-DUR,KLOR-CON) 20 MEQ tablet  . UNABLE TO FIND  . XELJANZ 5 MG TABS   No current facility-administered medications for this encounter.     Konrad Felix, PA-C [facility]  Pre-Surgical Testing [phone]

## 2020-04-19 NOTE — Anesthesia Preprocedure Evaluation (Addendum)
Anesthesia Evaluation  Patient identified by MRN, date of birth, ID band  Reviewed: Allergy & Precautions, NPO status , Patient's Chart, lab work & pertinent test results, reviewed documented beta blocker date and time   History of Anesthesia Complications Negative for: history of anesthetic complications  Airway Mallampati: II  TM Distance: >3 FB     Dental   Pulmonary shortness of breath, sleep apnea and Continuous Positive Airway Pressure Ventilation ,  Pulmonary sarcoidosis followed by pulmonology   breath sounds clear to auscultation       Cardiovascular hypertension, Pt. on home beta blockers and Pt. on medications + CAD (h/o spontaneous coronary artery dissection), + Past MI, + Cardiac Stents and +CHF   Rhythm:Regular Rate:Normal     Neuro/Psych  Headaches, negative psych ROS   GI/Hepatic Neg liver ROS, GERD  ,  Endo/Other  Morbid obesity  Renal/GU Retroperitoneal liposarcoma  negative genitourinary   Musculoskeletal  (+) Arthritis ,   Abdominal   Peds  Hematology  (+) anemia ,   Anesthesia Other Findings   Reproductive/Obstetrics negative OB ROS                           Anesthesia Physical Anesthesia Plan  ASA: III  Anesthesia Plan: General   Post-op Pain Management:    Induction: Intravenous  PONV Risk Score and Plan: 3 and Ondansetron, Dexamethasone and Midazolam  Airway Management Planned: Oral ETT  Additional Equipment:   Intra-op Plan:   Post-operative Plan: Possible Post-op intubation/ventilation  Informed Consent: I have reviewed the patients History and Physical, chart, labs and discussed the procedure including the risks, benefits and alternatives for the proposed anesthesia with the patient or authorized representative who has indicated his/her understanding and acceptance.     Dental advisory given  Plan Discussed with: CRNA and  Anesthesiologist  Anesthesia Plan Comments: (See PAT note 04/18/2020, Konrad Felix, PA-C)       Anesthesia Quick Evaluation

## 2020-04-20 ENCOUNTER — Other Ambulatory Visit (HOSPITAL_COMMUNITY)
Admission: RE | Admit: 2020-04-20 | Discharge: 2020-04-20 | Disposition: A | Discharge status: Home / Self Care | Payer: Medicare Other | Source: Ambulatory Visit | Attending: Urology | Admitting: Urology

## 2020-04-20 DIAGNOSIS — Z01812 Encounter for preprocedural laboratory examination: Secondary | ICD-10-CM | POA: Insufficient documentation

## 2020-04-20 DIAGNOSIS — Z20822 Contact with and (suspected) exposure to covid-19: Secondary | ICD-10-CM | POA: Diagnosis not present

## 2020-04-20 LAB — SARS CORONAVIRUS 2 (TAT 6-24 HRS): SARS Coronavirus 2: NEGATIVE

## 2020-04-21 NOTE — H&P (Signed)
: Left perirenal/retroperitoneal neoplasm   Isabella [name] is a 79 year old female with a left perirenal retroperitoneal mass. She has an extensive medical history and has been followed for multiple pulmonary lesions that have been biopsied with a confirmed diagnosis of sarcoidosis. She has been followed by Dr. [name] and has undergone extensive imaging in the past. At least dating back to September 2019, she was noted to have a mass in the left perirenal space. This mass looked atypical and had evidence of fat density it was initially thought to be a possible atypical angiomyolipoma or even possible necrotic perinephric fat. There were no features to suggest malignancy including no obvious enhancement. Over the last couple of years, it has been noted that this area has increased in size. Her most recent imaging was a CT scan of the abdomen with and without IV contrast on 01/03/2020. This confirmed an enlarged mass in the left perirenal sapce with very irregular borders measuring 6.7 x 5.1 x 2.8 cm. There also noted to be internal scattered calcifications which were new from prior imaging. Finally, there was noted to be some stranding along the fascia surrounding this fat. Prior PET imaging suggested some possible activity in this lesion although this was not definitive. Again, no enhancing components to this mass were noted. There is no retroperitoneal lymphadenopathy. It does not appear that this lesion is clearly arising from the kidney. There was a question raised by the radiologist that this could potentially represent an unusual liposarcoma. She was referred to Dr. Christie Beckers for further evaluation as he had previously cared for her and performed an abdominal hernia repair with mesh. Considering the proximity of this lesion to the kidney, she was referred to Urology for further evaluation. She denies any hematuria. She has no history of kidney cancer or kidney failure. Her most recent serum creatinine was  0.8.   Her prior urologic history is significant for overactive bladder with urge incontinence. She is treated with oxybutynin under the care of Dr. Matilde Sprang with approximately 50% improvement.   Her past medical history is significant for sarcoidosis, coronary artery disease status post a myocardial infarction 2012 resulting in a cardiac stent and myocardial infarction in 2019 that did not result in intervention. She was most recently seen by cardiology in June of 2021 and proceeds with annual follow-up currently. Her primary cardiologist is Dr. Pernell Dupre. She denies any recent chest pain or shortness of breath.   Her past surgical history is particularly significant for a large right upper quadrant incision for an appendectomy and cholecystectomy in 1970. She also apparently underwent a hysterectomy with abdominal hernia repair in 1990. She was then treated with a repeat hernia repair with mesh in 2007 by Dr. Brantley Stage as stated above.   She returns today after undergoing an MRI and a biopsy of her perirenal mass. She tolerated her biopsy well without complications. 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 Her MRI confirmed her mass that appears to be originating in the perirenal space rather than from the kidney itself. However, it is quite close in proximity to the kidney indicating that treatment may require nephrectomy almost definitely. No evidence of metastatic disease or regional lymphadenopathy was identified.     ALLERGIES: Codeine Derivatives Erythromycin TABS hydromorphone Imodium Neomycin Penicillins Percocet TABS TraMADol HCl TABS Zestril TABS    MEDICATIONS: Diazepam 10 mg tablet Take 10 mg 30-60 minutes prior to your MRI  Metoprolol Tartrate 25 mg tablet  Oxybutynin Chloride Er  10 mg tablet, extended release 24 hr 1 tablet PO Daily  Aspirin Ec 81 mg tablet, delayed release Oral  Atorvastatin Calcium 40 mg tablet Oral  Centrum  Silver Women  Chlorpheniramine Maleate 4 mg tablet Oral  Lasix 40 mg tablet Oral  Losartan Potassium  Mesalamine Er 0.375 gram capsule, ext release 24 hr  Nitroglycerin 0.4 mg tablet, sublingual  Potassium  Tofacitinib Citrate  Tylenol Extra Strength  Vitamin D3 1,250 mcg (50,000 unit) capsule Oral     [facility] PSH: Complex cystometrogram, w/ void pressure and urethral pressure profile studies, any technique - 07/26/2019 Complex Uroflow - 07/26/2019 Cystoscopy - 07/28/2019 Emg surf Electrd - 07/26/2019 Inject For cystogram - 07/26/2019 Intrabd voidng Press - 07/26/2019       PSH Notes: Hand Surgery, Hernia Repair, Tonsillectomy, Tubal Ligation, Hysteroscopy Of Uterus, Cath Stent Placement, Heart Surgery, Cataract Surgery, Shoulder Surgery Right, Gallbladder Surgery, Arthroscopy Knee Left   NON-GU PSH: Hernia Repair - 2016 Hysteroscope Procedure - 2016 Knee Arthroscopy; Dx - 2016 Remove Tonsils - 2016 Tubal Ligation - 2016     [facility] PMH: Left renal neoplasm - 02/22/2020 Mixed incontinence - 01/26/2020, - 05/25/2019 Urinary Frequency - 01/26/2020, - 05/25/2019 Nocturia - 05/25/2019 Nocturnal Enuresis - 05/25/2019 Other microscopic hematuria, Microscopic hematuria - 2016 Renal cyst, Bilateral renal cysts - 2016    NON-GU PMH: Encounter for general adult medical examination without abnormal findings, Encounter for preventive health examination - 2016 Myocardial Infarction, History of myocardial infarction - 2016 Personal history of other diseases of the musculoskeletal system and connective tissue, History of arthritis - 2016 Personal history of other diseases of the nervous system and sense organs, History of sleep apnea - 2016    FAMILY HISTORY: Diabetes - Runs In Family No significant past medical history - Runs In Family   SOCIAL HISTORY: No Social History     Notes: Never a smoker   REVIEW OF SYSTEMS:    GU Review Female:   Patient reports hard to postpone urination, get up at night  to urinate, and leakage of urine. Patient denies frequent urination, burning /pain with urination, stream starts and stops, trouble starting your stream, have to strain to urinate, and currently pregnant.  Gastrointestinal (Lower):   Patient denies diarrhea and constipation.  Gastrointestinal (Upper):   Patient denies nausea and vomiting.  Constitutional:   Patient denies fever, night sweats, weight loss, and fatigue.  Skin:   Patient denies skin rash/ lesion and itching.  Eyes:   Patient denies blurred vision and double vision.  Ears/ Nose/ Throat:   Patient denies sore throat and sinus problems.  Hematologic/Lymphatic:   Patient denies swollen glands and easy bruising.  Cardiovascular:   Patient denies chest pains and leg swelling.  Respiratory:   Patient denies cough and shortness of breath.  Endocrine:   Patient denies excessive thirst.  Musculoskeletal:   Patient denies back pain and joint pain.  Neurological:   Patient denies headaches and dizziness.  Psychologic:   Patient denies depression and anxiety.   VITAL SIGNS:     Weight 218 lb / 98.88 kg  Height 61 in / 154.94 cm  BMI 41.2 kg/m   MULTI-SYSTEM PHYSICAL EXAMINATION:    Constitutional: Well-nourished. No physical deformities. Normally developed. Good grooming.  Neck: Neck symmetrical, not swollen. Normal tracheal position.  Respiratory: No labored breathing, no use of accessory muscles. Clear bilaterally.  Cardiovascular: Normal temperature, normal extremity pulses, no swelling, no varicosities. Regular rate and rhythm.  Lymphatic: No enlargement of neck,  axillae, groin.  Skin: No paleness, no jaundice, no cyanosis. No lesion, no ulcer, no rash.  Neurologic / Psychiatric: Oriented to time, oriented to place, oriented to person. No depression, no anxiety, no agitation.  Gastrointestinal: Obese, she has a well-healed incision in the right subcostal region as well as her lower midline.  Eyes: Normal conjunctivae. Normal  eyelids.  Ears, Nose, Mouth, and Throat: Left ear no scars, no lesions, no masses. Right ear no scars, no lesions, no masses. Nose no scars, no lesions, no masses. Normal hearing. Normal lips.  Musculoskeletal: Normal gait and station of head and neck.     Complexity of Data:  Records Review:   Pathology Reports, Previous Patient Records  X-Ray Review: MRI Abdomen: Reviewed Films.    Notes:                     CLINICAL DATA: Follow-up left perinephric mass.   EXAM:  MRI ABDOMEN WITHOUT AND WITH CONTRAST   TECHNIQUE:  Multiplanar multisequence MR imaging of the abdomen was performed  both before and after the administration of intravenous contrast.   CONTRAST: 62m GADAVIST GADOBUTROL 1 MMOL/ML IV SOLN   COMPARISON: CT on 01/03/2020 and PET-CT 04/10/2018   FINDINGS:  Lower chest: No acute findings.   Hepatobiliary: Image degradation by motion artifact noted. No  hepatic masses identified. Several small cysts are again seen in  both right and lobes. Prior cholecystectomy. No evidence of biliary  obstruction.   Pancreas: Image degradation by motion artifact noted. A cystic  lesion is seen in the pancreatic body measuring 1.6 cm in maximum  diameter on image 15/22. A few other tiny less than 5 mm cystic foci  are also seen in the pancreatic tail. No evidence of main pancreatic  ductal dilatation.   Spleen: Within normal limits in size and appearance.   Adrenals/Urinary Tract: Image degradation by motion artifact noted.  Multiple benign-appearing cysts are again seen in both kidneys. A  heterogeneous mass is again seen in the left lateral perinephric  space which abuts, but does not appear to arise from the left  kidney. This mass is predominantly fat attenuation with a central  area of soft tissue density which show contrast enhancement on  subtraction imaging. The mass is difficult to measure due to its  ill-defined margins, however the more central enhancing component   measures 5.2 x 2.6 cm on image 44/19, without significant change  most recent CT, likely increased in size from approximately 3.9 x  2.3 cm on prior PET-CT. Low-grade retroperitoneal liposarcoma cannot  be excluded.   Stomach/Bowel: Visualized portion unremarkable.   Vascular/Lymphatic: No pathologically enlarged lymph nodes  identified. No abdominal aortic aneurysm.   Other: None.   Musculoskeletal: No suspicious bone lesions identified. Lumbar spine  fusion hardware noted.   IMPRESSION:  Image degradation by motion artifact noted. Mixed fat and soft  tissue intensity mass in the left lateral perinephric space does not  appear to arise from the left renal parenchyma. This remains  unchanged in size since recent CT, but does appear mildly increased  in size since 2019 PET-CT. Low-grade retroperitoneal liposarcoma  cannot be excluded.   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 Recommend continued follow-up by MRI in 1  year. This recommendation follows ACR consensus guidelines:  Management of Incidental Pancreatic Cysts: A White Paper of the ACR  Incidental  Findings Committee.    Electronically Signed  By: Marlaine Hind M.D.  On: 03/02/2020 15:29   CLINICAL DATA: Lung nodule follow-up, history of cardiac stent   EXAM:  CT CHEST WITHOUT CONTRAST   TECHNIQUE:  Multidetector CT imaging of the chest was performed following the  standard protocol without IV contrast.   COMPARISON: 12/03/2018   FINDINGS:  Cardiovascular: Calcified atheromatous plaque, minimal in the  thoracic aorta. Heart size mildly enlarged with signs of prior  myocardial infarction unchanged. Signs of RIGHT coronary stenting.  No pericardial effusion.   Mediastinum/Nodes: RIGHT thyroid nodule 1.7 cm unchanged and  recently biopsied. No thoracic inlet adenopathy. No mediastinal   adenopathy. No gross hilar adenopathy with limited assessment on  noncontrast imaging. Esophagus mildly patulous.   Lungs/Pleura: Nodule in the RIGHT lower lobe (image 82, series 8)  1.6 x 1.6 cm. Stable compared to the exam from 1 year ago, remaining  smaller than on the study of March 31, 2018. Areas of  calcification without dense coarse or central calcification show no  change.   (Image 103, series 8) 1.5 x 1.4 cm RIGHT lower lobe pulmonary nodule  more rounded than on the previous exam in the medial RIGHT lung base  previously measuring 12 x 11 mm. This is however closer in terms of  size to its previous size from 2019. Other scattered small nodules  with similar appearance.   Fissural thickening in the LEFT chest with no change, associated  with linear opacities compatible with scarring adjacent to the major  fissure.   Upper Abdomen: Incidental imaging of upper abdominal contents shows  stable appearance of a fatty density mass or area adjacent to the  LEFT kidney, this does however show calcification on the current  exam measuring approximately 4.7 x 3.7 cm near the upper margin of  the lesion within 2 mm of its size since 2019. Hepatic cysts. No  acute process in the upper abdomen.   Musculoskeletal: Spinal degenerative changes. No acute or  destructive bone finding.   IMPRESSION:  1. Waxing and waning appearance of the medial, previously biopsied  RIGHT lung nodule and stable appearance of the dominant nodule as  compared to the most recent study, smaller than on a more remote  evaluation. Findings remain most suggestive of sequela of  sarcoidosis. No new or suspicious findings. Continued attention on  follow-up is suggested.  2. 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 Does show slow enlargement over time and now  displays calcifications which are somewhat atypical for  angiomyolipoma. Atypical lipomatous lesion in the  retroperitoneum is  considered for this reason. Follow-up abdominal imaging with without  contrast may be helpful if possible to assess for any internal  septation with enhancement or other suspicious features. Little  change in size with respect imaged portions is seen since 2019.  3. Aortic atherosclerosis.   Aortic Atherosclerosis (ICD10-I70.0).    Electronically Signed  By: Zetta Bills M.D.  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 Retroperitoneal liposarcoma: I had a detailed discussion with Isabella [name] today regarding her biopsy results confirming a well-differentiated liposarcoma of the retroperitoneum. Considering her CT scan of the chest, her pulmonary nodules are likely consistent with her diagnosis of sarcoidosis and do not appear  to have significantly progressed. However, I would like to review her case in detail with the [facility] multidisciplinary Tumor Board this Friday. Her mass does look to be completely resectable although would likely require a left nephrectomy. Considering her normal baseline renal function and relatively normal appearing right kidney aside from simple renal cysts, this would appear to be appropriate and with little risk of significant renal dysfunction postoperatively.   If the multidisciplinary team is in agreement, she will tentatively be scheduled for a left open radical nephrectomy and resection of a retroperitoneal mass. We have reviewed this procedure in detail today including the potential risks and complications. These include the risks of general anesthesia, cardiopulmonary risks and I will plan to ensure that both her cardiologist and pulmonologist do not feel that additional preoperative testing would be indicated prior to proceeding. We discussed the risks of damage to adjacent structures including the spleen, lung, pancreas, bowel, and major  neurovascular structures. We discussed the risk of tumor recurrence and the potential need for additional therapy. We discussed the risks of renal dysfunction and the risks of bleeding/infection. She gives informed consent to proceed pending the decision from the tumor board later this week.   My tentative plan is to proceed with a left open radical nephrectomy and resection of her retroperitoneal mass en bloc. I will plan to leave metal clips around the resection margins in case postoperative radiation therapy is indicated.

## 2020-04-24 ENCOUNTER — Inpatient Hospital Stay (HOSPITAL_COMMUNITY): Payer: Medicare Other | Admitting: Certified Registered Nurse Anesthetist

## 2020-04-24 ENCOUNTER — Inpatient Hospital Stay (HOSPITAL_COMMUNITY): Payer: Medicare Other | Admitting: Physician Assistant

## 2020-04-24 ENCOUNTER — Encounter (HOSPITAL_COMMUNITY): Payer: Self-pay | Admitting: Urology

## 2020-04-24 ENCOUNTER — Inpatient Hospital Stay (HOSPITAL_COMMUNITY)
Admission: RE | Admit: 2020-04-24 | Discharge: 2020-04-27 | DRG: 829 | Disposition: A | Discharge status: Home Health | Payer: Medicare Other | Attending: Urology | Admitting: Urology

## 2020-04-24 ENCOUNTER — Encounter (HOSPITAL_COMMUNITY)
Admission: RE | Disposition: A | Discharge status: Home Health | Payer: Self-pay | Source: Home / Self Care | Attending: Urology

## 2020-04-24 ENCOUNTER — Other Ambulatory Visit: Payer: Self-pay

## 2020-04-24 DIAGNOSIS — Z79899 Other long term (current) drug therapy: Secondary | ICD-10-CM

## 2020-04-24 DIAGNOSIS — C499 Malignant neoplasm of connective and soft tissue, unspecified: Secondary | ICD-10-CM | POA: Diagnosis present

## 2020-04-24 DIAGNOSIS — I2510 Atherosclerotic heart disease of native coronary artery without angina pectoris: Secondary | ICD-10-CM | POA: Diagnosis present

## 2020-04-24 DIAGNOSIS — Z6841 Body Mass Index (BMI) 40.0 and over, adult: Secondary | ICD-10-CM | POA: Diagnosis not present

## 2020-04-24 DIAGNOSIS — C480 Malignant neoplasm of retroperitoneum: Secondary | ICD-10-CM | POA: Diagnosis present

## 2020-04-24 DIAGNOSIS — Z7982 Long term (current) use of aspirin: Secondary | ICD-10-CM

## 2020-04-24 DIAGNOSIS — Z9049 Acquired absence of other specified parts of digestive tract: Secondary | ICD-10-CM | POA: Diagnosis not present

## 2020-04-24 DIAGNOSIS — Z955 Presence of coronary angioplasty implant and graft: Secondary | ICD-10-CM | POA: Diagnosis not present

## 2020-04-24 DIAGNOSIS — Z20822 Contact with and (suspected) exposure to covid-19: Secondary | ICD-10-CM | POA: Diagnosis present

## 2020-04-24 DIAGNOSIS — D860 Sarcoidosis of lung: Secondary | ICD-10-CM | POA: Diagnosis present

## 2020-04-24 DIAGNOSIS — K219 Gastro-esophageal reflux disease without esophagitis: Secondary | ICD-10-CM | POA: Diagnosis present

## 2020-04-24 DIAGNOSIS — Z981 Arthrodesis status: Secondary | ICD-10-CM

## 2020-04-24 DIAGNOSIS — I252 Old myocardial infarction: Secondary | ICD-10-CM | POA: Diagnosis not present

## 2020-04-24 DIAGNOSIS — I10 Essential (primary) hypertension: Secondary | ICD-10-CM | POA: Diagnosis present

## 2020-04-24 HISTORY — PX: NEPHRECTOMY: SHX65

## 2020-04-24 LAB — TYPE AND SCREEN
ABO/RH(D): A POS
Antibody Screen: NEGATIVE

## 2020-04-24 LAB — BASIC METABOLIC PANEL
Anion gap: 12 (ref 5–15)
BUN: 11 mg/dL (ref 8–23)
CO2: 20 mmol/L — ABNORMAL LOW (ref 22–32)
Calcium: 8.3 mg/dL — ABNORMAL LOW (ref 8.9–10.3)
Chloride: 106 mmol/L (ref 98–111)
Creatinine, Ser: 0.7 mg/dL (ref 0.44–1.00)
GFR, Estimated: >60 mL/min (ref >60)
Glucose, Bld: 146 mg/dL — ABNORMAL HIGH (ref 70–99)
Potassium: 3.5 mmol/L (ref 3.5–5.1)
Sodium: 138 mmol/L (ref 135–145)

## 2020-04-24 LAB — HEMOGLOBIN AND HEMATOCRIT, BLOOD
HCT: 38 % (ref 36.0–46.0)
Hemoglobin: 12.7 g/dL (ref 12.0–15.0)

## 2020-04-24 SURGERY — NEPHRECTOMY
Anesthesia: General | Site: Abdomen | Laterality: Left

## 2020-04-24 MED ORDER — ONDANSETRON HCL 4 MG/2ML IJ SOLN
INTRAMUSCULAR | Status: DC | PRN
  Administered 2020-04-24: 4 mg via INTRAVENOUS

## 2020-04-24 MED ORDER — DEXAMETHASONE SODIUM PHOSPHATE 10 MG/ML IJ SOLN
INTRAMUSCULAR | Status: DC | PRN
  Administered 2020-04-24: 5 mg via INTRAVENOUS

## 2020-04-24 MED ORDER — LACTATED RINGERS IV SOLN: INTRAVENOUS | Status: DC

## 2020-04-24 MED ORDER — FENTANYL CITRATE (PF) 100 MCG/2ML IJ SOLN
25.0000 ug | INTRAMUSCULAR | Status: DC | PRN
  Administered 2020-04-24: 25 ug via INTRAVENOUS
  Administered 2020-04-24: 50 ug via INTRAVENOUS
  Administered 2020-04-24: 25 ug via INTRAVENOUS

## 2020-04-24 MED ORDER — HYDROCODONE-ACETAMINOPHEN 5-325 MG PO TABS
1.0000 | ORAL_TABLET | Freq: Four times a day (QID) | ORAL | Status: DC | PRN
  Administered 2020-04-25: 1 via ORAL
  Filled 2020-04-24: qty 1

## 2020-04-24 MED ORDER — DEXTROSE-NACL 5-0.45 % IV SOLN: INTRAVENOUS | Status: DC

## 2020-04-24 MED ORDER — MESALAMINE ER 250 MG PO CPCR
1500.0000 mg | ORAL_CAPSULE | Freq: Every day | ORAL | Status: DC
  Administered 2020-04-24 – 2020-04-27 (×4): 1500 mg via ORAL
  Filled 2020-04-24 (×4): qty 6

## 2020-04-24 MED ORDER — MORPHINE SULFATE (PF) 2 MG/ML IV SOLN: 2.0000 mg | INTRAVENOUS | Status: DC | PRN

## 2020-04-24 MED ORDER — CHLORHEXIDINE GLUCONATE 0.12 % MT SOLN
15.0000 mL | Freq: Once | OROMUCOSAL | Status: AC
  Administered 2020-04-24: 11:00:00 15 mL via OROMUCOSAL

## 2020-04-24 MED ORDER — DEXAMETHASONE SODIUM PHOSPHATE 10 MG/ML IJ SOLN
INTRAMUSCULAR | Status: AC
  Filled 2020-04-24: qty 1

## 2020-04-24 MED ORDER — ORAL CARE MOUTH RINSE
15.0000 mL | Freq: Two times a day (BID) | OROMUCOSAL | Status: DC
  Administered 2020-04-24 – 2020-04-27 (×6): 15 mL via OROMUCOSAL

## 2020-04-24 MED ORDER — TOFACITINIB CITRATE 5 MG PO TABS
5.0000 mg | ORAL_TABLET | Freq: Two times a day (BID) | ORAL | Status: DC
  Administered 2020-04-24 – 2020-04-27 (×6): 5 mg via ORAL
  Filled 2020-04-24: qty 1

## 2020-04-24 MED ORDER — FENTANYL CITRATE (PF) 100 MCG/2ML IJ SOLN
INTRAMUSCULAR | Status: AC
  Filled 2020-04-24: qty 2

## 2020-04-24 MED ORDER — SODIUM CHLORIDE (PF) 0.9 % IJ SOLN
INTRAMUSCULAR | Status: AC
  Filled 2020-04-24: qty 50

## 2020-04-24 MED ORDER — LIDOCAINE HCL (CARDIAC) PF 100 MG/5ML IV SOSY
PREFILLED_SYRINGE | INTRAVENOUS | Status: DC | PRN
  Administered 2020-04-24: 100 mg via INTRAVENOUS

## 2020-04-24 MED ORDER — BUPIVACAINE LIPOSOME 1.3 % IJ SUSP
20.0000 mL | Freq: Once | INTRAMUSCULAR | Status: AC
  Administered 2020-04-24: 16:00:00 20 mL
  Filled 2020-04-24: qty 20

## 2020-04-24 MED ORDER — OXYBUTYNIN CHLORIDE ER 5 MG PO TB24
10.0000 mg | ORAL_TABLET | Freq: Every day | ORAL | Status: DC
  Administered 2020-04-25: 23:00:00 10 mg via ORAL
  Filled 2020-04-24 (×2): qty 2

## 2020-04-24 MED ORDER — MAGNESIUM CITRATE PO SOLN: 0.5000 | Freq: Once | ORAL | Status: DC

## 2020-04-24 MED ORDER — NITROGLYCERIN 0.4 MG SL SUBL: 0.4000 mg | SUBLINGUAL_TABLET | SUBLINGUAL | Status: DC | PRN

## 2020-04-24 MED ORDER — DIPHENHYDRAMINE HCL 12.5 MG/5ML PO ELIX: 12.5000 mg | ORAL_SOLUTION | Freq: Four times a day (QID) | ORAL | Status: DC | PRN

## 2020-04-24 MED ORDER — INDIGOTINDISULFONATE SODIUM 8 MG/ML IJ SOLN
INTRAMUSCULAR | Status: AC
  Filled 2020-04-24: qty 5

## 2020-04-24 MED ORDER — ACETAMINOPHEN 10 MG/ML IV SOLN
1000.0000 mg | Freq: Four times a day (QID) | INTRAVENOUS | Status: DC
  Administered 2020-04-24 – 2020-04-25 (×2): 1000 mg via INTRAVENOUS
  Filled 2020-04-24 (×2): qty 100

## 2020-04-24 MED ORDER — ROCURONIUM BROMIDE 10 MG/ML (PF) SYRINGE
PREFILLED_SYRINGE | INTRAVENOUS | Status: DC | PRN
  Administered 2020-04-24: 20 mg via INTRAVENOUS
  Administered 2020-04-24: 80 mg via INTRAVENOUS

## 2020-04-24 MED ORDER — PHENYLEPHRINE 40 MCG/ML (10ML) SYRINGE FOR IV PUSH (FOR BLOOD PRESSURE SUPPORT)
PREFILLED_SYRINGE | INTRAVENOUS | Status: DC | PRN
  Administered 2020-04-24: 40 ug via INTRAVENOUS
  Administered 2020-04-24: 60 ug via INTRAVENOUS
  Administered 2020-04-24: 20 ug via INTRAVENOUS
  Administered 2020-04-24: 80 ug via INTRAVENOUS
  Administered 2020-04-24: 60 ug via INTRAVENOUS
  Administered 2020-04-24: 80 ug via INTRAVENOUS
  Administered 2020-04-24: 40 ug via INTRAVENOUS
  Administered 2020-04-24: 80 ug via INTRAVENOUS

## 2020-04-24 MED ORDER — LIDOCAINE HCL (PF) 2 % IJ SOLN
INTRAMUSCULAR | Status: AC
  Filled 2020-04-24: qty 5

## 2020-04-24 MED ORDER — SUGAMMADEX SODIUM 200 MG/2ML IV SOLN
INTRAVENOUS | Status: DC | PRN
  Administered 2020-04-24: 200 mg via INTRAVENOUS

## 2020-04-24 MED ORDER — FENTANYL CITRATE (PF) 250 MCG/5ML IJ SOLN
INTRAMUSCULAR | Status: DC | PRN
  Administered 2020-04-24 (×5): 50 ug via INTRAVENOUS
  Administered 2020-04-24: 100 ug via INTRAVENOUS

## 2020-04-24 MED ORDER — VANCOMYCIN HCL IN DEXTROSE 1-5 GM/200ML-% IV SOLN
1000.0000 mg | Freq: Once | INTRAVENOUS | Status: AC
  Administered 2020-04-24: 11:00:00 1000 mg via INTRAVENOUS
  Filled 2020-04-24: qty 200

## 2020-04-24 MED ORDER — SODIUM CHLORIDE 0.9 % IV SOLN
INTRAVENOUS | Status: AC | PRN
  Administered 2020-04-24: 40 mL

## 2020-04-24 MED ORDER — PROPOFOL 10 MG/ML IV BOLUS
INTRAVENOUS | Status: DC | PRN
  Administered 2020-04-24: 50 mg via INTRAVENOUS
  Administered 2020-04-24: 150 mg via INTRAVENOUS

## 2020-04-24 MED ORDER — DOCUSATE SODIUM 100 MG PO CAPS
100.0000 mg | ORAL_CAPSULE | Freq: Two times a day (BID) | ORAL | Status: DC
  Administered 2020-04-25 – 2020-04-27 (×5): 100 mg via ORAL
  Filled 2020-04-24 (×6): qty 1

## 2020-04-24 MED ORDER — METOPROLOL TARTRATE 25 MG PO TABS
25.0000 mg | ORAL_TABLET | Freq: Two times a day (BID) | ORAL | Status: DC
  Administered 2020-04-24 – 2020-04-27 (×6): 25 mg via ORAL
  Filled 2020-04-24 (×6): qty 1

## 2020-04-24 MED ORDER — PHENYLEPHRINE HCL-NACL 10-0.9 MG/250ML-% IV SOLN
INTRAVENOUS | Status: DC | PRN
  Administered 2020-04-24: 40 ug/min via INTRAVENOUS

## 2020-04-24 MED ORDER — FENTANYL CITRATE (PF) 250 MCG/5ML IJ SOLN
INTRAMUSCULAR | Status: AC
  Filled 2020-04-24: qty 5

## 2020-04-24 MED ORDER — ATORVASTATIN CALCIUM 40 MG PO TABS
80.0000 mg | ORAL_TABLET | Freq: Every day | ORAL | Status: DC
  Administered 2020-04-24 – 2020-04-26 (×3): 80 mg via ORAL
  Filled 2020-04-24 (×3): qty 2

## 2020-04-24 MED ORDER — LACTATED RINGERS IV SOLN: INTRAVENOUS | Status: DC | PRN

## 2020-04-24 MED ORDER — VANCOMYCIN HCL IN DEXTROSE 1-5 GM/200ML-% IV SOLN
1000.0000 mg | Freq: Two times a day (BID) | INTRAVENOUS | Status: AC
Start: 2020-04-24 — End: 2020-04-25
  Administered 2020-04-24: 1000 mg via INTRAVENOUS
  Filled 2020-04-24: qty 200

## 2020-04-24 MED ORDER — ONDANSETRON HCL 4 MG/2ML IJ SOLN
INTRAMUSCULAR | Status: AC
  Filled 2020-04-24: qty 2

## 2020-04-24 MED ORDER — 0.9 % SODIUM CHLORIDE (POUR BTL) OPTIME
TOPICAL | Status: DC | PRN
  Administered 2020-04-24: 15:00:00 2000 mL

## 2020-04-24 MED ORDER — DIPHENHYDRAMINE HCL 50 MG/ML IJ SOLN: 12.5000 mg | Freq: Four times a day (QID) | INTRAMUSCULAR | Status: DC | PRN

## 2020-04-24 MED ORDER — CHLORHEXIDINE GLUCONATE CLOTH 2 % EX PADS: 6.0000 | MEDICATED_PAD | Freq: Every day | CUTANEOUS | Status: DC

## 2020-04-24 MED ORDER — PHENYLEPHRINE HCL (PRESSORS) 10 MG/ML IV SOLN
INTRAVENOUS | Status: AC
  Filled 2020-04-24: qty 1

## 2020-04-24 MED ORDER — PROPOFOL 10 MG/ML IV BOLUS
INTRAVENOUS | Status: AC
  Filled 2020-04-24: qty 20

## 2020-04-24 MED ORDER — ROCURONIUM BROMIDE 10 MG/ML (PF) SYRINGE
PREFILLED_SYRINGE | INTRAVENOUS | Status: AC
  Filled 2020-04-24: qty 10

## 2020-04-24 MED ORDER — HYDRALAZINE HCL 20 MG/ML IJ SOLN
INTRAMUSCULAR | Status: AC
  Filled 2020-04-24: qty 1

## 2020-04-24 MED ORDER — ORAL CARE MOUTH RINSE: 15.0000 mL | Freq: Once | OROMUCOSAL | Status: AC

## 2020-04-24 MED ORDER — HYDRALAZINE HCL 20 MG/ML IJ SOLN
INTRAMUSCULAR | Status: DC | PRN
  Administered 2020-04-24: 10 mg via INTRAVENOUS

## 2020-04-24 MED ORDER — FUROSEMIDE 40 MG PO TABS
40.0000 mg | ORAL_TABLET | Freq: Every day | ORAL | Status: DC
  Administered 2020-04-24 – 2020-04-27 (×4): 40 mg via ORAL
  Filled 2020-04-24 (×4): qty 1

## 2020-04-24 MED ORDER — LOSARTAN POTASSIUM 50 MG PO TABS
50.0000 mg | ORAL_TABLET | Freq: Every day | ORAL | Status: DC
  Administered 2020-04-24 – 2020-04-27 (×4): 50 mg via ORAL
  Filled 2020-04-24 (×4): qty 1

## 2020-04-24 MED ORDER — ACETAMINOPHEN 500 MG PO TABS: 500.0000 mg | ORAL_TABLET | Freq: Four times a day (QID) | ORAL | Status: DC | PRN

## 2020-04-24 MED ORDER — ASPIRIN EC 81 MG PO TBEC
81.0000 mg | DELAYED_RELEASE_TABLET | Freq: Every day | ORAL | Status: DC
  Administered 2020-04-25 – 2020-04-26 (×2): 81 mg via ORAL
  Filled 2020-04-24: qty 1

## 2020-04-24 MED ORDER — ONDANSETRON HCL 4 MG/2ML IJ SOLN: 4.0000 mg | INTRAMUSCULAR | Status: DC | PRN

## 2020-04-24 SURGICAL SUPPLY — 64 items
ADH SKN CLS APL DERMABOND .7 (GAUZE/BANDAGES/DRESSINGS) ×2
AGENT HMST KT MTR STRL THRMB (HEMOSTASIS) ×1
APL PRP STRL LF DISP 70% ISPRP (MISCELLANEOUS) ×1
ATTRACTOMAT 16X20 MAGNETIC DRP (DRAPES) ×3 IMPLANT
BLADE EXTENDED COATED 6.5IN (ELECTRODE) ×3 IMPLANT
BLADE HEX COATED 2.75 (ELECTRODE) ×3 IMPLANT
CATH FOLEY 2WAY SLVR  5CC 16FR (CATHETERS) ×3
CATH FOLEY 2WAY SLVR 5CC 16FR (CATHETERS) ×1 IMPLANT
CHLORAPREP W/TINT 26 (MISCELLANEOUS) ×3 IMPLANT
CLIP VESOLOCK LG 6/CT PURPLE (CLIP) ×12 IMPLANT
CLIP VESOLOCK MED LG 6/CT (CLIP) ×12 IMPLANT
COVER BACK TABLE 60X90IN (DRAPES) ×3 IMPLANT
COVER SURGICAL LIGHT HANDLE (MISCELLANEOUS) ×3 IMPLANT
COVER WAND RF STERILE (DRAPES) IMPLANT
DECANTER SPIKE VIAL GLASS SM (MISCELLANEOUS) IMPLANT
DERMABOND ADVANCED (GAUZE/BANDAGES/DRESSINGS) ×4
DERMABOND ADVANCED .7 DNX12 (GAUZE/BANDAGES/DRESSINGS) IMPLANT
DISSECTOR ROUND CHERRY 3/8 STR (MISCELLANEOUS) ×1 IMPLANT
DRAIN CHANNEL 15F RND FF 3/16 (WOUND CARE) ×3 IMPLANT
DRAIN PENROSE 0.5X18 (DRAIN) IMPLANT
DRAPE INCISE IOBAN 66X45 STRL (DRAPES) ×3 IMPLANT
DRAPE LAPAROSCOPIC ABDOMINAL (DRAPES) IMPLANT
DRAPE LAPAROTOMY TRNSV 102X78 (DRAPES) IMPLANT
DRAPE WARM FLUID 44X44 (DRAPES) ×3 IMPLANT
ELECT REM PT RETURN 15FT ADLT (MISCELLANEOUS) ×3 IMPLANT
EVACUATOR SILICONE 100CC (DRAIN) ×3 IMPLANT
GAUZE 4X4 16PLY RFD (DISPOSABLE) ×3 IMPLANT
GAUZE SPONGE 4X4 12PLY STRL (GAUZE/BANDAGES/DRESSINGS) IMPLANT
GLOVE BIOGEL M STRL SZ7.5 (GLOVE) ×3 IMPLANT
HEMOSTAT SURGICEL 4X8 (HEMOSTASIS) ×2 IMPLANT
KIT BASIN OR (CUSTOM PROCEDURE TRAY) ×3 IMPLANT
KIT TURNOVER KIT A (KITS) IMPLANT
LIGASURE IMPACT 36 18CM CVD LR (INSTRUMENTS) ×2 IMPLANT
LOOP VESSEL MAXI BLUE (MISCELLANEOUS) ×3 IMPLANT
NS IRRIG 1000ML POUR BTL (IV SOLUTION) ×5 IMPLANT
PACK GENERAL/GYN (CUSTOM PROCEDURE TRAY) ×3 IMPLANT
PENCIL SMOKE EVACUATOR (MISCELLANEOUS) IMPLANT
PROTECTOR NERVE ULNAR (MISCELLANEOUS) ×6 IMPLANT
SPONGE LAP 18X18 RF (DISPOSABLE) ×6 IMPLANT
SPONGE SURGIFOAM ABS GEL 100 (HEMOSTASIS) IMPLANT
STAPLER VISISTAT 35W (STAPLE) ×3 IMPLANT
SURGIFLO W/THROMBIN 8M KIT (HEMOSTASIS) ×3 IMPLANT
SUT ETHILON 3 0 PS 1 (SUTURE) IMPLANT
SUT MNCRL AB 4-0 PS2 18 (SUTURE) ×4 IMPLANT
SUT MON AB 2-0 SH 27 (SUTURE) ×9
SUT MON AB 2-0 SH27 (SUTURE) ×2 IMPLANT
SUT PDS AB 1 TP1 96 (SUTURE) ×6 IMPLANT
SUT SILK 0 (SUTURE) ×3
SUT SILK 0 30XBRD TIE 6 (SUTURE) ×1 IMPLANT
SUT SILK 2 0 (SUTURE) ×3
SUT SILK 2-0 30XBRD TIE 12 (SUTURE) ×1 IMPLANT
SUT VIC AB 2-0 CT1 27 (SUTURE) ×6
SUT VIC AB 2-0 CT1 27XBRD (SUTURE) IMPLANT
SUT VIC AB 2-0 SH 27 (SUTURE) ×12
SUT VIC AB 2-0 SH 27X BRD (SUTURE) ×4 IMPLANT
SUT VIC AB 4-0 RB1 27 (SUTURE) ×12
SUT VIC AB 4-0 RB1 27XBRD (SUTURE) ×4 IMPLANT
TAPE UMBILICAL COTTON 1/8X30 (MISCELLANEOUS) ×1 IMPLANT
TOWEL OR 17X26 10 PK STRL BLUE (TOWEL DISPOSABLE) ×6 IMPLANT
TOWEL OR NON WOVEN STRL DISP B (DISPOSABLE) ×3 IMPLANT
TUBING CONNECTING 10 (TUBING) ×2 IMPLANT
TUBING CONNECTING 10' (TUBING) ×1
URINEMETER 200ML W/220 (MISCELLANEOUS) ×3 IMPLANT
WATER STERILE IRR 1000ML POUR (IV SOLUTION) ×1 IMPLANT

## 2020-04-24 NOTE — Op Note (Signed)
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 Left open radical nephrectomy  Surgeon: Pryor Curia. M.D.  Assistant: Debbrah Alar, PA-C  An assistant was required for this surgical procedure.  The duties of the assistant included but were not limited to suctioning, passing suture, retraction. This procedure would not be able to be performed without an Environmental consultant.  Resident: Dr. Ermelinda Das  Anesthesia: General  Complications: None  EBL: 500 mL  IVF:  2700 mL crystalloid  Specimens: 1. Left kidney and perirenal mass  Disposition of specimens: Pathology  Drains: 1. # 60 Blake perinephric drain   Indication:  Ms. Folkerts is a 79 y.o. year old patient with a left perirenal mass that was biopsy proven to be liposarcoma.  After a thorough review of the management options for their renal mass, they elected to proceed with surgical treatment and the above procedure.  We have discussed the potential benefits and risks of the procedure, side effects of the proposed treatment, the likelihood of the patient achieving the goals of the procedure, and any potential problems that might occur during the procedure or recuperation. Informed consent has been obtained.   Description of procedure:  The patient was taken to the operating room and a general anesthetic was administered. The patient was given preoperative antibiotics, placed in the supine position with padding placed under their left flank. Care was taken to pad all potential pressure points, and they were prepped and draped in the usual sterile fashion. Next a preoperative timeout was performed.  A left subcostal incision was made about 2 fingerbreadths below the level of the costal margin and carried down through the subcutaneous tissues to the anterior rectus fascia. The anterior rectus fascia  was then divided along with the underlying rectus muscle which exposed the posterior rectus sheath. The posterior rectus sheath and peritoneum were then sharply entered allowing direct entry into the peritoneal cavity. The layers of the abdominal wall were then opened along the length of the incision and a self retaining Bookwalter retractor was placed. The white line of Toldt was incised allowing the colon to be mobilized medially and the plane between the mesocolon and the anterior layer of Gerota's fascia to be developed and the kidney to be exposed. The posterior and lateral planes were then opened with a combination of blunt and sharp dissection allowing the kidney to be mobilized.  There was no evidence of local extension of the perirenal mass.  It was clear that a nephrectomy would be necessary for removal of the mass with adequate margins.  The ureter and gonadal vein were identified inferiorly and isolated.  Gerota's fascia was intentionally entered and the adrenal gland was separated off the superior pole of the kidney.  During this dissection the adrenal gland was partially incised. The hepatorenal ligaments were divided allowing the kidney to be freely mobile. The renal hilum was then carefully isolated with a combination of blunt and sharp dissection allowing the renal arterial and venous structures to be separated and isolated.  There were two renal arteries identified including an upper pole and hilar artery.  These were each dissected free and ligated with a combination of suture ties with 0-silk and 10 mm Weck clips and divided.  There was a retroaortic renal vein that was identified inferiorly and ligated with a combination of 10 mm Weck clips and a 0-silk suture tie.   The kidney  was then removed out of the abdomen.  The renal fossa was irrigated with 1 L of normal saline. The adrenal gland was examined and was bleeding slightly.  Surgiflo and surgicel were used to control this.  Hemostasis  appeared adequate.  The colon was placed back into its usual anatomic position and the retractor was taken down.  The abdomen was taken out of flex.  Preparations were then made for closure. #1 looped PDS sutures were used to close the abdominal wall in two layers with the posterior two layers of the lateral abdominal wall and the posterior rectus sheath closed with the first suture and a second #1 PDS looped PDS suture used to close the external oblique fascia in continuity with the anterior rectus sheath.  60 cc of Exparel was injected into the wound.  The subcutaneous tissue was reapproximated with 2-0 vicryl suture.  The superficial would was irrigated and the skin was reapproximated with running 4-0 monocryl subcuticular sutures. Dermabond was applied. The patient was able to be extubated and transferred to the recovery unit in satisfactory condition.  Pryor Curia MD

## 2020-04-24 NOTE — Anesthesia Postprocedure Evaluation (Signed)
Anesthesia Post Note  Patient: Isabella [name]  Procedure(s) Performed: OPEN RADICAL NEPHRECTOMY WITH RESECTION OF PERITONEAL MASS (Left Abdomen)     Patient location during evaluation: PACU Anesthesia Type: General Level of consciousness: awake and alert Pain management: pain level controlled Vital Signs Assessment: post-procedure vital signs reviewed and stable Respiratory status: spontaneous breathing, nonlabored ventilation, respiratory function stable and patient connected to nasal cannula oxygen Cardiovascular status: blood pressure returned to baseline and stable Postop Assessment: no apparent nausea or vomiting Anesthetic complications: no   No complications documented.  Last Vitals:  Vitals:   04/24/20 1815 04/24/20 1830  BP: (!) 182/84 (!) 186/91  Pulse: (!) 56 (!) 47  Resp: 14 13  Temp:  36.7 C  SpO2: 100% 100%    Last Pain:  Vitals:   04/24/20 1830  TempSrc:   PainSc: Asleep                 Madisson Kulaga S

## 2020-04-24 NOTE — Transfer of Care (Signed)
Immediate Anesthesia Transfer of Care Note  Patient: Isabella [name] [facility]  Procedure(s) Performed: OPEN RADICAL NEPHRECTOMY WITH RESECTION OF PERITONEAL MASS (Left Abdomen)  Patient Location: PACU  Anesthesia Type:General  Level of Consciousness: awake, alert , oriented and patient cooperative  Airway & Oxygen Therapy: Patient Spontanous Breathing and Patient connected to face mask oxygen  Post-op Assessment: Report given to RN and Post -op Vital signs reviewed and stable  Post vital signs: Reviewed and stable  Last Vitals:  Vitals Value Taken Time  BP 143/74 04/24/20 1652  Temp    Pulse 55 04/24/20 1656  Resp 12 04/24/20 1656  SpO2 100 % 04/24/20 1656  Vitals shown include unvalidated device data.  Last Pain:  Vitals:   04/24/20 1110  TempSrc:   PainSc: 0-No pain         Complications: No complications documented.

## 2020-04-24 NOTE — Anesthesia Procedure Notes (Signed)
Procedure Name: Intubation Date/Time: 04/24/2020 2:09 PM Performed by: Raenette Rover, CRNA Pre-anesthesia Checklist: Patient identified, Suction available, Patient being monitored and Emergency Drugs available Patient Re-evaluated:Patient Re-evaluated prior to induction Oxygen Delivery Method: Circle system utilized Preoxygenation: Pre-oxygenation with 100% oxygen Induction Type: IV induction Ventilation: Mask ventilation without difficulty and Oral airway inserted - appropriate to patient size Laryngoscope Size: Mac and 3 Grade View: Grade I Tube type: Oral Tube size: 7.0 mm Number of attempts: 1 Airway Equipment and Method: Stylet Placement Confirmation: positive ETCO2,  ETT inserted through vocal cords under direct vision and breath sounds checked- equal and bilateral Secured at: 22 cm Tube secured with: Tape Dental Injury: Teeth and Oropharynx as per pre-operative assessment and Injury to lip  Comments: Accidentally cut lip during masking/OAW placement. Pressure held until hemostasis.

## 2020-04-25 ENCOUNTER — Encounter (HOSPITAL_COMMUNITY): Payer: Self-pay | Admitting: Urology

## 2020-04-25 LAB — BASIC METABOLIC PANEL
Anion gap: 9 (ref 5–15)
BUN: 15 mg/dL (ref 8–23)
CO2: 24 mmol/L (ref 22–32)
Calcium: 7.6 mg/dL — ABNORMAL LOW (ref 8.9–10.3)
Chloride: 101 mmol/L (ref 98–111)
Creatinine, Ser: 1.07 mg/dL — ABNORMAL HIGH (ref 0.44–1.00)
GFR, Estimated: 53 mL/min — ABNORMAL LOW (ref >60)
Glucose, Bld: 240 mg/dL — ABNORMAL HIGH (ref 70–99)
Potassium: 3.4 mmol/L — ABNORMAL LOW (ref 3.5–5.1)
Sodium: 134 mmol/L — ABNORMAL LOW (ref 135–145)

## 2020-04-25 LAB — HEMOGLOBIN AND HEMATOCRIT, BLOOD
HCT: 34.7 % — ABNORMAL LOW (ref 36.0–46.0)
Hemoglobin: 11.5 g/dL — ABNORMAL LOW (ref 12.0–15.0)

## 2020-04-25 MED ORDER — ACETAMINOPHEN 500 MG PO TABS
500.0000 mg | ORAL_TABLET | Freq: Four times a day (QID) | ORAL | Status: DC | PRN
  Administered 2020-04-25 – 2020-04-27 (×2): 500 mg via ORAL
  Filled 2020-04-25 (×4): qty 1

## 2020-04-25 MED ORDER — ENOXAPARIN SODIUM 40 MG/0.4ML ~~LOC~~ SOLN
40.0000 mg | SUBCUTANEOUS | Status: DC
  Administered 2020-04-25 – 2020-04-26 (×2): 40 mg via SUBCUTANEOUS
  Filled 2020-04-25 (×2): qty 0.4

## 2020-04-25 NOTE — Progress Notes (Signed)
1 Day Post-Op Subjective: Feeling well this morning Pain controlled Has not gotten up to walk yet  Shakes/tremors after anesthesia yesterday that has since resolved No nausea/vomiting Adequate UOP Hgb stable Cr slightly increased, appropriate No BM/Flatus  Objective: Vital signs in last 24 hours: Temp:  [97.4 F (36.3 C)-98.5 F (36.9 C)] 97.5 F (36.4 C) (12/14 0503) Pulse Rate:  [47-67] 67 (12/14 0503) Resp:  [10-18] 16 (12/14 0503) BP: (133-197)/(56-106) 133/56 (12/14 0503) SpO2:  [97 %-100 %] 97 % (12/14 0503) Weight:  [99.9 kg-101.4 kg] 101.4 kg (12/13 1957)  Intake/Output from previous day: 12/13 0701 - 12/14 0700 In: 4143.7 [P.O.:300; I.V.:3443.7; IV Piggyback:400] Out: 2000 [Urine:1500; Blood:500] Intake/Output this shift: No intake/output data recorded.  Physical Exam:  General: Alert and oriented CV: RRR Lungs: Clear Abdomen: Soft, ND Incisions: c/d/i, small amount of surrounding ecchymosis Ext: NT, No erythema  Lab Results: Recent Labs    04/24/20 1654 04/25/20 0520  HGB 12.7 11.5*  HCT 38.0 34.7*   BMET Recent Labs    04/24/20 1654 04/25/20 0520  NA 138 134*  K 3.5 3.4*  CL 106 101  CO2 20* 24  GLUCOSE 146* 240*  BUN 11 15  CREATININE 0.70 1.07*  CALCIUM 8.3* 7.6*     Studies/Results: No results found.  Assessment/Plan: 79yo F POD#1 s/p Left open radical nephrectomy on 04/24/20. Recovering appropriately.  - continue pain control PRN - ASA81 daily - home CPAP - CLD - continue mIVF until tolerating good PO - bowel regimen - periop antibiotics complete - PT consult today - encourage ambulation, OOB, deep breathing   LOS: 1 day   Isabella [name] 04/25/2020, 7:37 AM

## 2020-04-25 NOTE — Plan of Care (Signed)
  Problem: Education: Goal: Knowledge of General Education information will improve Description: Including pain rating scale, medication(s)/side effects and non-pharmacologic comfort measures Outcome: Progressing   Problem: Health Behavior/Discharge Planning: Goal: Ability to manage health-related needs will improve Outcome: Progressing   Problem: Clinical Measurements: Goal: Ability to maintain clinical measurements within normal limits will improve Outcome: Progressing Goal: Diagnostic test results will improve Outcome: Progressing   Problem: Activity: Goal: Risk for activity intolerance will decrease Outcome: Progressing   Problem: Nutrition: Goal: Adequate nutrition will be maintained Outcome: Progressing   Problem: Coping: Goal: Level of anxiety will decrease Outcome: Progressing   Problem: Pain Managment: Goal: General experience of comfort will improve Outcome: Progressing   Problem: Education: Goal: Knowledge of the prescribed therapeutic regimen will improve Outcome: Progressing   Problem: Respiratory: Goal: Ability to achieve and maintain a regular respiratory rate will improve Outcome: Progressing

## 2020-04-25 NOTE — Evaluation (Signed)
Physical Therapy Evaluation Patient Details Name: Isabella [name] MRN: [id number] DOB: 05-Apr-1941 Today's Date: 04/25/2020   History of Present Illness  Patient is 79 y.o. female s/p Left open radical nephrectomy on 04/24/20. PMH significant for lymphedema, HTN, GERD, CAD, OA, MI, lymphedema, Bil TKA.    Clinical Impression  Isabella [name] is 79 y.o. female admitted with above HPI and diagnosis. Patient is currently limited by functional impairments below (see PT problem list). Patient lives with her son and is modified independent with SPC/RW for mobility at baseline. Patient will benefit from continued skilled PT interventions to address impairments and progress independence with mobility, recommending HHPT vs no follow up pending progress in acute care. Acute PT will follow and progress as able.     Follow Up Recommendations Home health PT;No PT follow up (HHPT vs No follow up pending progress in acute setting)    Equipment Recommendations  None recommended by PT    Recommendations for Other Services       Precautions / Restrictions Precautions Precautions: Fall Restrictions Weight Bearing Restrictions: No      Mobility  Bed Mobility Overal bed mobility: Needs Assistance Bed Mobility: Rolling;Sidelying to Sit Rolling: Min assist Sidelying to sit: Min assist;HOB elevated       General bed mobility comments: cues for log roll technique, assist to raise trunk upright fully.    Transfers Overall transfer level: Needs assistance Equipment used: Rolling walker (2 wheeled) Transfers: Sit to/from Stand Sit to Stand: Min assist         General transfer comment: cues for technique with RW, light assist for power up due to abdominal discomfort.  Ambulation/Gait Ambulation/Gait assistance: Min assist;Min guard Gait Distance (Feet): 50 Feet Assistive device: Rolling walker (2 wheeled) Gait Pattern/deviations: Step-through pattern;Decreased stride length;Trunk flexed Gait  velocity: decr   General Gait Details: trunk slightly flexed and cues provided for posture. pt maintained safe position to RW. min guard with intermittent assist and cues for tturning.  Stairs            Wheelchair Mobility    Modified Rankin (Stroke Patients Only)       Balance Overall balance assessment: Needs assistance Sitting-balance support: Feet supported Sitting balance-Leahy Scale: Good     Standing balance support: During functional activity;Bilateral upper extremity supported Standing balance-Leahy Scale: Fair                               Pertinent Vitals/Pain Pain Assessment: 0-10 Pain Score: 6  Pain Location: abdomen Pain Descriptors / Indicators: Discomfort Pain Intervention(s): Limited activity within patient's tolerance;Monitored during session;Repositioned    Home Living Family/patient expects to be discharged to:: Private residence Living Arrangements: Children Available Help at Discharge: Family Type of Home: House Home Access: Level entry;Ramped entrance     Home Layout: One level;Other (Comment) (has about 3 steps down to dining room with rails bil) Home Equipment: Walker - 2 wheels;Cane - single point;Bedside commode;Shower seat Additional Comments: son lives with her and daugther next door.    Prior Function Level of Independence: Independent with assistive device(s)         Comments: pt using SPC in home and RW for community mobility     Hand Dominance   Dominant Hand: Right    Extremity/Trunk Assessment   Upper Extremity Assessment Upper Extremity Assessment: Overall WFL for tasks assessed    Lower Extremity Assessment Lower Extremity Assessment: Overall WFL for tasks  assessed    Cervical / Trunk Assessment Cervical / Trunk Assessment: Normal  Communication   Communication: No difficulties  Cognition Arousal/Alertness: Awake/alert Behavior During Therapy: WFL for tasks assessed/performed Overall  Cognitive Status: Within Functional Limits for tasks assessed                                        General Comments      Exercises General Exercises - Lower Extremity Ankle Circles/Pumps: AROM;Both;15 reps;Seated   Assessment/Plan    PT Assessment Patient needs continued PT services  PT Problem List Decreased strength;Decreased range of motion;Decreased activity tolerance;Decreased balance;Decreased mobility;Decreased knowledge of use of DME;Decreased knowledge of precautions       PT Treatment Interventions Gait training;DME instruction;Stair training;Therapeutic exercise;Therapeutic activities;Functional mobility training;Balance training;Patient/family education    PT Goals (Current goals can be found in the Care Plan section)  Acute Rehab PT Goals Patient Stated Goal: return home and back to independence PT Goal Formulation: With patient Time For Goal Achievement: 05/09/20 Potential to Achieve Goals: Good    Frequency Min 3X/week   Barriers to discharge        Co-evaluation               AM-PAC PT "6 Clicks" Mobility  Outcome Measure Help needed turning from your back to your side while in a flat bed without using bedrails?: A Little Help needed moving from lying on your back to sitting on the side of a flat bed without using bedrails?: A Little Help needed moving to and from a bed to a chair (including a wheelchair)?: A Little Help needed standing up from a chair using your arms (e.g., wheelchair or bedside chair)?: A Little Help needed to walk in hospital room?: A Little Help needed climbing 3-5 steps with a railing? : A Lot 6 Click Score: 17    End of Session Equipment Utilized During Treatment: Gait belt Activity Tolerance: Patient tolerated treatment well Patient left: in chair;with call bell/phone within reach Nurse Communication: Mobility status PT Visit Diagnosis: Muscle weakness (generalized) (M62.81);Difficulty in walking, not  elsewhere classified (R26.2)    Time: 1136-1207 PT Time Calculation (min) (ACUTE ONLY): 31 min   Charges:   PT Evaluation $PT Eval Low Complexity: 1 Low PT Treatments $Gait Training: 8-22 mins        Verner Mould, DPT Acute Rehabilitation Services  Office [phone] Pager [phone]  04/25/2020 12:25 PM

## 2020-04-25 NOTE — Plan of Care (Signed)
  Problem: Education: Goal: Knowledge of General Education information will improve Description: Including pain rating scale, medication(s)/side effects and non-pharmacologic comfort measures Outcome: Progressing   Problem: Health Behavior/Discharge Planning: Goal: Ability to manage health-related needs will improve Outcome: Progressing   Problem: Clinical Measurements: Goal: Ability to maintain clinical measurements within normal limits will improve Outcome: Progressing Goal: Will remain free from infection Outcome: Progressing Goal: Diagnostic test results will improve Outcome: Progressing Goal: Respiratory complications will improve Outcome: Progressing Goal: Cardiovascular complication will be avoided Outcome: Progressing   Problem: Activity: Goal: Risk for activity intolerance will decrease Outcome: Progressing   Problem: Nutrition: Goal: Adequate nutrition will be maintained Outcome: Progressing   Problem: Coping: Goal: Level of anxiety will decrease Outcome: Progressing   Problem: Elimination: Goal: Will not experience complications related to bowel motility Outcome: Progressing Goal: Will not experience complications related to urinary retention Outcome: Progressing   Problem: Pain Managment: Goal: General experience of comfort will improve Outcome: Progressing   Problem: Safety: Goal: Ability to remain free from injury will improve Outcome: Progressing   Problem: Skin Integrity: Goal: Risk for impaired skin integrity will decrease Outcome: Progressing   Problem: Education: Goal: Knowledge of the prescribed therapeutic regimen will improve Outcome: Progressing   Problem: Bowel/Gastric: Goal: Gastrointestinal status for postoperative course will improve Outcome: Progressing   Problem: Clinical Measurements: Goal: Postoperative complications will be avoided or minimized Outcome: Progressing   Problem: Respiratory: Goal: Ability to achieve and  maintain a regular respiratory rate will improve Outcome: Progressing   Problem: Skin Integrity: Goal: Demonstration of wound healing without infection will improve Outcome: Progressing   Problem: Urinary Elimination: Goal: Ability to avoid or minimize complications of infection will improve Outcome: Progressing Goal: Ability to achieve and maintain urine output will improve Outcome: Progressing

## 2020-04-25 NOTE — Progress Notes (Signed)
Pt. was now ready to be placed on CPAP for h/s, added 2 lpm humidified oxygen into circuit per current setting, using own hose & full face mask, made aware to notify if needed, RN also made aware CPAP placed on for h/s.

## 2020-04-26 LAB — BASIC METABOLIC PANEL
Anion gap: 8 (ref 5–15)
BUN: 18 mg/dL (ref 8–23)
CO2: 26 mmol/L (ref 22–32)
Calcium: 7.6 mg/dL — ABNORMAL LOW (ref 8.9–10.3)
Chloride: 101 mmol/L (ref 98–111)
Creatinine, Ser: 1.31 mg/dL — ABNORMAL HIGH (ref 0.44–1.00)
GFR, Estimated: 42 mL/min — ABNORMAL LOW (ref >60)
Glucose, Bld: 113 mg/dL — ABNORMAL HIGH (ref 70–99)
Potassium: 3.6 mmol/L (ref 3.5–5.1)
Sodium: 135 mmol/L (ref 135–145)

## 2020-04-26 LAB — HEMOGLOBIN AND HEMATOCRIT, BLOOD
HCT: 32.2 % — ABNORMAL LOW (ref 36.0–46.0)
Hemoglobin: 10.4 g/dL — ABNORMAL LOW (ref 12.0–15.0)

## 2020-04-26 MED ORDER — BISACODYL 10 MG RE SUPP
10.0000 mg | Freq: Once | RECTAL | Status: AC
  Administered 2020-04-26: 09:00:00 10 mg via RECTAL
  Filled 2020-04-26: qty 1

## 2020-04-26 MED ORDER — ENSURE ENLIVE PO LIQD
237.0000 mL | Freq: Three times a day (TID) | ORAL | Status: DC
  Administered 2020-04-27 (×2): 237 mL via ORAL

## 2020-04-26 MED ORDER — HYDROCODONE-ACETAMINOPHEN 5-325 MG PO TABS
1.0000 | ORAL_TABLET | Freq: Four times a day (QID) | ORAL | Status: DC | PRN
  Administered 2020-04-26 (×2): 1 via ORAL
  Filled 2020-04-26 (×2): qty 1

## 2020-04-26 MED ORDER — OXYCODONE HCL 5 MG PO TABS: 5.0000 mg | ORAL_TABLET | Freq: Four times a day (QID) | ORAL | 0 refills | Status: DC | PRN

## 2020-04-26 MED ORDER — HYDROCODONE-ACETAMINOPHEN 5-325 MG PO TABS: 1.0000 | ORAL_TABLET | Freq: Four times a day (QID) | ORAL | 0 refills | Status: DC | PRN

## 2020-04-26 MED ORDER — METOCLOPRAMIDE HCL 5 MG/ML IJ SOLN
5.0000 mg | Freq: Four times a day (QID) | INTRAMUSCULAR | Status: DC
  Administered 2020-04-26 – 2020-04-27 (×3): 5 mg via INTRAVENOUS
  Filled 2020-04-26 (×3): qty 2

## 2020-04-26 MED ORDER — SIMETHICONE 80 MG PO CHEW
80.0000 mg | CHEWABLE_TABLET | Freq: Four times a day (QID) | ORAL | Status: DC | PRN
  Administered 2020-04-26: 16:00:00 80 mg via ORAL
  Filled 2020-04-26: qty 1

## 2020-04-26 NOTE — Progress Notes (Signed)
Patient ID: Isabella [name], female   DOB: [birth year], 79 y.o.   MRN: [id number]  2 Days Post-Op Subjective: Pt doing well.  Still no flatus or BM.  Pain controlled when she takes oral medication.   Tolerating liquids.    Objective: Vital signs in last 24 hours: Temp:  [97.7 F (36.5 C)-98.6 F (37 C)] 97.7 F (36.5 C) (12/15 0550) Pulse Rate:  [53-67] 53 (12/15 0550) Resp:  [16-23] 17 (12/15 0550) BP: (105-142)/(55-72) 142/70 (12/15 0550) SpO2:  [97 %-100 %] 100 % (12/15 0550)  Intake/Output from previous day: 12/14 0701 - 12/15 0700 In: 2013.4 [P.O.:1080; I.V.:933.4] Out: 650 [Urine:650] Intake/Output this shift: No intake/output data recorded.  Physical Exam:  General: Alert and oriented CV: RRR Lungs: Clear Abdomen: Soft, ND, No bowel sounds Incisions: C/D/I Ext: NT, No erythema  Lab Results: Recent Labs    04/24/20 1654 04/25/20 0520 04/26/20 0618  HGB 12.7 11.5* 10.4*  HCT 38.0 34.7* 32.2*   BMET Recent Labs    04/25/20 0520 04/26/20 0618  NA 134* 135  K 3.4* 3.6  CL 101 101  CO2 24 26  GLUCOSE 240* 113*  BUN 15 18  CREATININE 1.07* 1.31*  CALCIUM 7.6* 7.6*     Studies/Results: No results found.  Assessment/Plan: POD #2 s/p left open radical nephrectomy - Dulcolax suppository - Follow renal function - Path pending - Ambulate/PT - Advance diet - Possible D/C home later today if doing well   LOS: 2 days   Dutch Gray 04/26/2020, 7:11 AM

## 2020-04-26 NOTE — Progress Notes (Signed)
Pt. found on CPAP on rounds, tolerating well.

## 2020-04-26 NOTE — Discharge Instructions (Signed)

## 2020-04-26 NOTE — TOC Progression Note (Signed)
Transition of Care [facility]) - Progression Note    Patient Details  Name: Isabella [name] MRN: [id number] Date of Birth: 1941-05-10  Transition of Care [facility]) CM/SW Contact  Wilfrido Luedke, Juliann Pulse, RN Phone Number: 04/26/2020, 2:01 PM  Clinical Narrative: Alvis Lemmings for HHPT-rep Tommi Rumps following. No further CM needs.      Expected Discharge Plan: [place] Barriers to Discharge: Continued Medical Work up  Expected Discharge Plan and Services Expected Discharge Plan: [place]   Discharge Planning Services: CM Consult   Living arrangements for the past 2 months: Single Family Home                           HH Arranged: PT [place]: [place] Date [place]: 04/26/20 Time HH Agency Contacted: 1401 Representative spoke with at [place]: [place] ([place]) Interventions    Readmission Risk Interventions No flowsheet data found.

## 2020-04-26 NOTE — Plan of Care (Signed)
  Problem: Clinical Measurements: Goal: Will remain free from infection Outcome: Progressing   Problem: Education: Goal: Knowledge of General Education information will improve Description: Including pain rating scale, medication(s)/side effects and non-pharmacologic comfort measures Outcome: Progressing   Problem: Activity: Goal: Risk for activity intolerance will decrease Outcome: Progressing

## 2020-04-26 NOTE — Discharge Summary (Signed)
Date of admission: 04/24/2020  Date of discharge: 04/27/2020  Admission diagnosis: Retroperitoneal Liposarcoma  Discharge diagnosis:  Retroperitoneal Liposarcoma  History and Physical: For full details, please see admission history and physical. Briefly, Isabella [name] is a 79 y.o. woman with  Retroperitoneal Liposarcoma.  After discussing management/treatment options, he elected to proceed with surgical treatment.  Hospital Course: Isabella [name] was taken to the operating room on 04/24/2020 and underwent an open left radical nephrectomy.She tolerated this procedure well and without complications. Postoperatively, she was able to be transferred to a regular hospital room following recovery from anesthesia.  She was able to begin ambulating the day after surgery. She remained hemodynamically stable overnight.  She had excellent urine output and passed TOV on POD#1. PT was consulted for evaluation. She was transitioned to oral pain medication, tolerated a clear liquid diet, and had met all discharge criteria and was able to be discharged home later on POD#3 with home health PT.  Laboratory values: Recent Labs    04/24/20 1654 04/25/20 0520 04/26/20 0618  HGB 12.7 11.5* 10.4*  HCT 38.0 34.7* 32.2*    Disposition: Home  Discharge instruction: She was instructed to be ambulatory but to refrain from heavy lifting, strenuous activity, or driving. He was instructed on urethral catheter care.  Discharge medications:   Allergies as of 04/26/2020      Reactions   Codeine Shortness Of Breath, Other (See Comments)   Made back hurt; does not affect liver per patient but has has caused difficulty breathing/tolerates Percocet only in small doses Back hurts does not affect liver per patient but has has caused difficulty breathing but tolerates Percocet only at small doses   Imodium A-d [loperamide Hcl] Shortness Of Breath, Other (See Comments)   Caused a pain & "squeezing" sensation in the lungs also    Neomycin Swelling, Other (See Comments)   Reaction to eye ointment - eyes became swollen shut Makes wounds ooze   Penicillinase Hives, Shortness Of Breath, Rash   Has patient had a PCN reaction causing immediate rash, facial/tongue/throat swelling, SOB or lightheadedness with hypotension: Yes Has patient had a PCN reaction causing severe rash involving mucus membranes or skin necrosis: No Has patient had a PCN reaction that required hospitalization:No Has patient had a PCN reaction occurring within the last 10 years: No If all of the above answers are "NO", then may proceed with Cephalosporin use.   Penicillins Hives, Shortness Of Breath, Rash   Reaction @ 18-yrs old Has patient had a PCN reaction causing immediate rash, facial/tongue/throat swelling, SOB or lightheadedness with hypotension: Yes Has patient had a PCN reaction causing severe rash involving mucus membranes or skin necrosis: No Has patient had a PCN reaction that required hospitalization: No Has patient had a PCN reaction occurring within the last 10 years: No If all of the above answers are "NO", then may proceed with Cephalosporin use.   Tramadol Shortness Of Breath, Other (See Comments)   (Per patient) "felt like lungs were being squeezed"   Lisinopril Cough   [place]et Venom Swelling   Swelling at site of sting   Adhesive [tape]    Tears the skin, paper tape is ok   Erythromycin Diarrhea   Hydromorphone Other (See Comments)   Severe Lethargy    Percocet [oxycodone-acetaminophen] Other (See Comments)   Confusion           Followup: She will f/u in 3-4 weeks.

## 2020-04-26 NOTE — TOC Initial Note (Signed)
Transition of Care [facility]) - Initial/Assessment Note    Patient Details  Name: Isabella [name] MRN: [id number] Date of Birth: 24-Dec-1940  Transition of Care [facility]) CM/SW Contact:    Dessa Phi, RN Phone Number: 04/26/2020, 10:01 AM  Clinical Narrative:  PT recc HHPT;spoke to dtr about HHC-she will discuss w/patient-await outcome.                 Expected Discharge Plan: Home/Self Care Barriers to Discharge: Continued Medical Work up   Patient Goals and CMS Choice Patient states their goals for this hospitalization and ongoing recovery are:: go home CMS Medicare.gov Compare Post Acute Care list provided to:: Patient Choice offered to / list presented to : Adult Children  Expected Discharge Plan and Services Expected Discharge Plan: Home/Self Care   Discharge Planning Services: CM Consult   Living arrangements for the past 2 months: Single Family Home                                      Prior Living Arrangements/Services Living arrangements for the past 2 months: Single Family Home Lives with:: Adult Children Patient language and need for interpreter reviewed:: Yes Do you feel safe going back to the place where you live?: Yes      Need for Family Participation in Patient Care: No (Comment) Care giver support system in place?: Yes (comment)   Criminal Activity/Legal Involvement Pertinent to Current Situation/Hospitalization: No - Comment as needed  Activities of Daily Living Home Assistive Devices/Equipment: Eyeglasses,Cane (specify quad or straight),Walker (specify type),Built-in shower seat,Grab bars in shower,Hand-held shower hose,CPAP (straight cane, walker with seat) ADL Screening (condition at time of admission) Patient's cognitive ability adequate to safely complete daily activities?: Yes Is the patient deaf or have difficulty hearing?: No Does the patient have difficulty seeing, even when wearing glasses/contacts?: No Does the patient have difficulty  concentrating, remembering, or making decisions?: No Patient able to express need for assistance with ADLs?: Yes Does the patient have difficulty dressing or bathing?: No Independently performs ADLs?: Yes (appropriate for developmental age) Does the patient have difficulty walking or climbing stairs?: Yes Weakness of Legs: Both Weakness of Arms/Hands: None  Permission Sought/Granted Permission sought to share information with : Case Manager Permission granted to share information with : Yes, Verbal Permission Granted  Share Information with NAME: Case manager     Permission granted to share info w Relationship: Tina dtr [phone] [id number]     Emotional Assessment Appearance:: Appears stated age Attitude/Demeanor/Rapport: Gracious Affect (typically observed): Accepting Orientation: : Oriented to Self,Oriented to Place,Oriented to  Time,Oriented to Situation Alcohol / Substance Use: Not Applicable Psych Involvement: No (comment)  Admission diagnosis:  Liposarcoma of retroperitoneum [facility]) [C48.0] Patient Active Problem List   Diagnosis Date Noted  . Liposarcoma of retroperitoneum ([place]) 04/24/2020  . Surgical counseling visit 03/30/2020  . Cellulitis of right lower extremity 03/11/2019  . Cellulitis and abscess of right leg 03/10/2019  . Thrombosis of right saphenous vein 03/10/2019  . Lymphedema 01/29/2018  . NSTEMI (non-ST elevated myocardial infarction) ([place]) 01/27/2018  . Malnutrition of moderate degree 10/29/2017  . Cellulitis 10/27/2017  . Acute diastolic CHF (congestive heart failure) ([place]) 10/15/2017  . Acute on chronic colitis 09/29/2017  . GERD (gastroesophageal reflux disease) 09/29/2017  . Degenerative spondylolisthesis 02/01/2014  . Sarcoidosis 07/14/2013  . Abnormal CT of the chest 04/21/2013  . Coronary atherosclerosis of native coronary artery 03/29/2013  Class: Chronic  . Old MI (myocardial infarction) 03/29/2013    Class: Chronic  . OA (osteoarthritis) of knee  06/17/2012  . Lung nodule 06/03/2012  . DJD (degenerative joint disease) 06/01/2012  . 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 Edema of lower extremity 03/29/2011  . Anemia associated with acute blood loss 03/25/2011  . Hematoma following PCI 03/24/2011  . Obesity (BMI 30-39.9)   . Obstructive sleep apnea 01/25/2009  . Hyperlipidemia 01/24/2009  . Essential hypertension 01/24/2009   PCP:  Glenis Smoker, MD Pharmacy:   [facility] [place] [address] Phone: [phone] Fax: [place], [place] - [place] AT [address] [address] Phone: [phone] Fax: [phone]     Social Determinants of Health (SDOH) Interventions    Readmission Risk Interventions No flowsheet data found.

## 2020-04-27 LAB — BASIC METABOLIC PANEL
Anion gap: 10 (ref 5–15)
BUN: 20 mg/dL (ref 8–23)
CO2: 27 mmol/L (ref 22–32)
Calcium: 7.9 mg/dL — ABNORMAL LOW (ref 8.9–10.3)
Chloride: 104 mmol/L (ref 98–111)
Creatinine, Ser: 1.22 mg/dL — ABNORMAL HIGH (ref 0.44–1.00)
GFR, Estimated: 46 mL/min — ABNORMAL LOW (ref >60)
Glucose, Bld: 103 mg/dL — ABNORMAL HIGH (ref 70–99)
Potassium: 3.4 mmol/L — ABNORMAL LOW (ref 3.5–5.1)
Sodium: 141 mmol/L (ref 135–145)

## 2020-04-27 LAB — HEMOGLOBIN AND HEMATOCRIT, BLOOD
HCT: 31.7 % — ABNORMAL LOW (ref 36.0–46.0)
Hemoglobin: 10.5 g/dL — ABNORMAL LOW (ref 12.0–15.0)

## 2020-04-27 MED ORDER — ASPIRIN EC 81 MG PO TBEC
81.0000 mg | DELAYED_RELEASE_TABLET | Freq: Every day | ORAL | Status: DC
  Administered 2020-04-27: 09:00:00 81 mg via ORAL
  Filled 2020-04-27: qty 1

## 2020-04-27 MED ORDER — BISACODYL 10 MG RE SUPP
10.0000 mg | Freq: Once | RECTAL | Status: AC
  Administered 2020-04-27: 09:00:00 10 mg via RECTAL
  Filled 2020-04-27: qty 1

## 2020-04-27 NOTE — Progress Notes (Signed)
Patient ID: Isabella [name], female   DOB: 1940-08-17, 79 y.o.   MRN: [id number]  3 Days Post-Op Subjective: Pt doing better.  Passed flatus last evening.  Objective: Vital signs in last 24 hours: Temp:  [97.8 F (36.6 C)-98.6 F (37 C)] 98.4 F (36.9 C) (12/16 0512) Pulse Rate:  [61-68] 68 (12/16 0512) Resp:  [17] 17 (12/15 2057) BP: (130-145)/(58-75) 130/64 (12/16 0512) SpO2:  [95 %-99 %] 99 % (12/16 0512)  Intake/Output from previous day: 12/15 0701 - 12/16 0700 In: 600 [P.O.:600] Out: 1250 [Urine:1250] Intake/Output this shift: No intake/output data recorded.  Physical Exam:  General: Alert and oriented CV: RRR Lungs: Clear Abdomen: Soft, ND Incisions: C/D/I Ext: NT, No erythema  Lab Results: Recent Labs    04/24/20 1654 04/25/20 0520 04/26/20 0618  HGB 12.7 11.5* 10.4*  HCT 38.0 34.7* 32.2*   BMET Recent Labs    04/25/20 0520 04/26/20 0618  NA 134* 135  K 3.4* 3.6  CL 101 101  CO2 24 26  GLUCOSE 240* 113*  BUN 15 18  CREATININE 1.07* 1.31*  CALCIUM 7.6* 7.6*     Studies/Results: No results found.  Assessment/Plan: POD # 3 s/p left radical nephrectomy - Ambulate, IS - Oral pain medication - If bowel function returning, will d/c home later today - Path pending - Home health PT   LOS: 3 days   Dutch Gray 04/27/2020, 7:22 AM

## 2020-04-27 NOTE — Progress Notes (Signed)
Physical Therapy Treatment Patient Details Name: Isabella [name] MRN: [id number] DOB: Mar 11, 1941 Today's Date: 04/27/2020    History of Present Illness Patient is 79 y.o. female s/p Left open radical nephrectomy on 04/24/20. PMH significant for lymphedema, HTN, GERD, CAD, OA, MI, lymphedema, Bil TKA.    PT Comments    Patient making steady progress with acute PT. Pt admits she has not ambulated outside of room since Tuesday with therapy but has been mobilizing up to chair with staff daily. She was able to advance gait distance today and ambulated with RW in hallway and min guard. She required intermittent standing rest breaks during gait due to incision pain but denied fatigue or SOB. Pt initiated functional exercises for LE strengthening and was able to complete sit<>stands with no c/o incision pain. Encouraged pt to ambulate with RN/NT staff 3 more times today. Acute PT will continue to progress pt as able, recommendation for HHPT follow up remains appropriate.    Follow Up Recommendations  Home health PT     Equipment Recommendations  None recommended by PT    Recommendations for Other Services       Precautions / Restrictions Precautions Precautions: Fall Restrictions Weight Bearing Restrictions: No    Mobility  Bed Mobility                  Transfers Overall transfer level: Needs assistance Equipment used: None Transfers: Sit to/from Stand Sit to Stand: Min guard;Supervision         General transfer comment: no assist required and pt able to rise with no device. completed sit<>stand from [facility] and from recliner.  Ambulation/Gait Ambulation/Gait assistance: Min guard Gait Distance (Feet): 180 Feet Assistive device: Rolling walker (2 wheeled) Gait Pattern/deviations: Step-through pattern;Decreased stride length;Trunk flexed Gait velocity: decr   General Gait Details: pt amb short distance in room and then further in hallway. no overt LOB, pt with tendency to  keep walker slihgtly ahead of her similar to use of rollator. 3 standign rest breaks taken due to incision discomfort.   Stairs             Wheelchair Mobility    Modified Rankin (Stroke Patients Only)       Balance Overall balance assessment: Needs assistance Sitting-balance support: Feet supported Sitting balance-Leahy Scale: Good     Standing balance support: During functional activity;Bilateral upper extremity supported Standing balance-Leahy Scale: Fair                              Cognition Arousal/Alertness: Awake/alert Behavior During Therapy: WFL for tasks assessed/performed Overall Cognitive Status: Within Functional Limits for tasks assessed                                        Exercises General Exercises - Lower Extremity Ankle Circles/Pumps: AROM;Both;15 reps;Seated Long Arc Quad: Both;10 reps;Seated;Strengthening Other Exercises Other Exercises: Repeated Sit<>Stands from recliner; 10 reps; no UE use.    General Comments        Pertinent Vitals/Pain Pain Assessment: 0-10 Pain Score: 3  Pain Location: abdomen/incision Pain Descriptors / Indicators: Discomfort Pain Intervention(s): Monitored during session;Limited activity within patient's tolerance;Repositioned    Home Living                      Prior Function  PT Goals (current goals can now be found in the care plan section) Acute Rehab PT Goals Patient Stated Goal: return home and back to independence PT Goal Formulation: With patient Time For Goal Achievement: 05/09/20 Potential to Achieve Goals: Good Progress towards PT goals: Progressing toward goals    Frequency    Min 3X/week      PT Plan Current plan remains appropriate    Co-evaluation              AM-PAC PT "6 Clicks" Mobility   Outcome Measure  Help needed turning from your back to your side while in a flat bed without using bedrails?: A Little Help needed  moving from lying on your back to sitting on the side of a flat bed without using bedrails?: A Little Help needed moving to and from a bed to a chair (including a wheelchair)?: A Little Help needed standing up from a chair using your arms (e.g., wheelchair or bedside chair)?: A Little Help needed to walk in hospital room?: A Little Help needed climbing 3-5 steps with a railing? : A Little 6 Click Score: 18    End of Session Equipment Utilized During Treatment: Gait belt Activity Tolerance: Patient tolerated treatment well Patient left: in chair;with call bell/phone within reach Nurse Communication: Mobility status PT Visit Diagnosis: Muscle weakness (generalized) (M62.81);Difficulty in walking, not elsewhere classified (R26.2)     Time: 9728-2060 PT Time Calculation (min) (ACUTE ONLY): 28 min  Charges:  $Gait Training: 8-22 mins $Therapeutic Exercise: 8-22 mins                     Verner Mould, DPT Acute Rehabilitation Services Office [phone] Pager [phone]     Isabella [name] 04/27/2020, 12:26 PM

## 2020-04-27 NOTE — Care Management Important Message (Signed)
Important Message  Patient Details IM Letter given to the Patient. Name: Isabella [name] MRN: [id number] Date of Birth: 07/28/1940   Medicare Important Message Given:  Yes     Kerin Salen 04/27/2020, 12:27 PM

## 2020-04-29 LAB — SURGICAL PATHOLOGY

## 2020-05-16 DIAGNOSIS — D860 Sarcoidosis of lung: Secondary | ICD-10-CM | POA: Diagnosis not present

## 2020-05-16 DIAGNOSIS — C480 Malignant neoplasm of retroperitoneum: Secondary | ICD-10-CM | POA: Diagnosis not present

## 2020-05-16 DIAGNOSIS — Z483 Aftercare following surgery for neoplasm: Secondary | ICD-10-CM | POA: Diagnosis not present

## 2020-05-16 DIAGNOSIS — I119 Hypertensive heart disease without heart failure: Secondary | ICD-10-CM | POA: Diagnosis not present

## 2020-05-16 DIAGNOSIS — D630 Anemia in neoplastic disease: Secondary | ICD-10-CM | POA: Diagnosis not present

## 2020-05-18 DIAGNOSIS — C480 Malignant neoplasm of retroperitoneum: Secondary | ICD-10-CM | POA: Diagnosis not present

## 2020-05-18 DIAGNOSIS — I119 Hypertensive heart disease without heart failure: Secondary | ICD-10-CM | POA: Diagnosis not present

## 2020-05-18 DIAGNOSIS — Z483 Aftercare following surgery for neoplasm: Secondary | ICD-10-CM | POA: Diagnosis not present

## 2020-05-18 DIAGNOSIS — D630 Anemia in neoplastic disease: Secondary | ICD-10-CM | POA: Diagnosis not present

## 2020-05-18 DIAGNOSIS — D860 Sarcoidosis of lung: Secondary | ICD-10-CM | POA: Diagnosis not present

## 2020-05-23 DIAGNOSIS — Z483 Aftercare following surgery for neoplasm: Secondary | ICD-10-CM | POA: Diagnosis not present

## 2020-05-23 DIAGNOSIS — C480 Malignant neoplasm of retroperitoneum: Secondary | ICD-10-CM | POA: Diagnosis not present

## 2020-05-23 DIAGNOSIS — D630 Anemia in neoplastic disease: Secondary | ICD-10-CM | POA: Diagnosis not present

## 2020-05-23 DIAGNOSIS — D860 Sarcoidosis of lung: Secondary | ICD-10-CM | POA: Diagnosis not present

## 2020-05-23 DIAGNOSIS — I119 Hypertensive heart disease without heart failure: Secondary | ICD-10-CM | POA: Diagnosis not present

## 2020-05-30 DIAGNOSIS — D630 Anemia in neoplastic disease: Secondary | ICD-10-CM | POA: Diagnosis not present

## 2020-05-30 DIAGNOSIS — I119 Hypertensive heart disease without heart failure: Secondary | ICD-10-CM | POA: Diagnosis not present

## 2020-05-30 DIAGNOSIS — Z483 Aftercare following surgery for neoplasm: Secondary | ICD-10-CM | POA: Diagnosis not present

## 2020-05-30 DIAGNOSIS — D860 Sarcoidosis of lung: Secondary | ICD-10-CM | POA: Diagnosis not present

## 2020-05-30 DIAGNOSIS — C480 Malignant neoplasm of retroperitoneum: Secondary | ICD-10-CM | POA: Diagnosis not present

## 2020-06-06 DIAGNOSIS — C480 Malignant neoplasm of retroperitoneum: Secondary | ICD-10-CM | POA: Diagnosis not present

## 2020-06-06 DIAGNOSIS — Z483 Aftercare following surgery for neoplasm: Secondary | ICD-10-CM | POA: Diagnosis not present

## 2020-06-06 DIAGNOSIS — I119 Hypertensive heart disease without heart failure: Secondary | ICD-10-CM | POA: Diagnosis not present

## 2020-06-06 DIAGNOSIS — D860 Sarcoidosis of lung: Secondary | ICD-10-CM | POA: Diagnosis not present

## 2020-06-06 DIAGNOSIS — D630 Anemia in neoplastic disease: Secondary | ICD-10-CM | POA: Diagnosis not present

## 2020-06-12 ENCOUNTER — Ambulatory Visit
Admission: RE | Admit: 2020-06-12 | Discharge: 2020-06-12 | Disposition: A | Discharge status: Home / Self Care | Payer: Medicare Other | Source: Ambulatory Visit | Attending: Family Medicine | Admitting: Family Medicine

## 2020-06-12 ENCOUNTER — Other Ambulatory Visit: Payer: Self-pay

## 2020-06-12 DIAGNOSIS — M810 Age-related osteoporosis without current pathological fracture: Secondary | ICD-10-CM

## 2020-06-12 DIAGNOSIS — Z780 Asymptomatic menopausal state: Secondary | ICD-10-CM | POA: Diagnosis not present

## 2020-06-20 DIAGNOSIS — C480 Malignant neoplasm of retroperitoneum: Secondary | ICD-10-CM | POA: Diagnosis not present

## 2020-06-20 DIAGNOSIS — D860 Sarcoidosis of lung: Secondary | ICD-10-CM | POA: Diagnosis not present

## 2020-06-20 DIAGNOSIS — I119 Hypertensive heart disease without heart failure: Secondary | ICD-10-CM | POA: Diagnosis not present

## 2020-06-20 DIAGNOSIS — D630 Anemia in neoplastic disease: Secondary | ICD-10-CM | POA: Diagnosis not present

## 2020-06-20 DIAGNOSIS — Z483 Aftercare following surgery for neoplasm: Secondary | ICD-10-CM | POA: Diagnosis not present

## 2020-06-23 DIAGNOSIS — G4733 Obstructive sleep apnea (adult) (pediatric): Secondary | ICD-10-CM | POA: Diagnosis not present

## 2020-06-26 ENCOUNTER — Encounter: Payer: Self-pay | Admitting: Radiation Oncology

## 2020-06-26 NOTE — Progress Notes (Addendum)
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 Isabella [name] is s/p left open radical nephrectomy with resection of her perirenal liposarcoma of the retroperitoneum. Unfortunately, she did have evidence of a positive surgical margin.    Past/Anticipated interventions by urology, if any: left open radical nephrectomy, referral to Dr. Tammi [name] to discuss radiation therapy to manage surgical margins  Past/Anticipated interventions by medical oncology, if any: no  Weight changes, if any: denies  Bowel/Bladder complaints, if any: She is being treated by Dr. Matilde Sprang for urge incontinence with oxybutynin. Denies dysuria or hematuria. Struggles with constipation related to oxybutynin.    Nausea/Vomiting, if any: denies  Pain issues, if any:  denies  SAFETY ISSUES:  Prior radiation? denies  Pacemaker/ICD? denies  Possible current pregnancy? no, female patient  Is the patient on methotrexate? denies  Current Complaints / other details:  80 year old female. Widowed. 2 sons and 2 daughters. Patient accompanied today by her youngest daughter.

## 2020-06-27 ENCOUNTER — Ambulatory Visit
Admission: RE | Admit: 2020-06-27 | Discharge: 2020-06-27 | Disposition: A | Discharge status: Home / Self Care | Payer: Medicare Other | Source: Ambulatory Visit | Attending: Radiation Oncology | Admitting: Radiation Oncology

## 2020-06-27 ENCOUNTER — Other Ambulatory Visit: Payer: Self-pay

## 2020-06-27 ENCOUNTER — Encounter: Payer: Self-pay | Admitting: Radiation Oncology

## 2020-06-27 VITALS — BP 175/91 | HR 63 | Temp 97.0°F | Resp 18 | Ht 62.0 in | Wt 212.0 lb

## 2020-06-27 DIAGNOSIS — E7800 Pure hypercholesterolemia, unspecified: Secondary | ICD-10-CM | POA: Insufficient documentation

## 2020-06-27 DIAGNOSIS — I10 Essential (primary) hypertension: Secondary | ICD-10-CM | POA: Insufficient documentation

## 2020-06-27 DIAGNOSIS — K51019 Ulcerative (chronic) pancolitis with unspecified complications: Secondary | ICD-10-CM | POA: Insufficient documentation

## 2020-06-27 DIAGNOSIS — C480 Malignant neoplasm of retroperitoneum: Secondary | ICD-10-CM | POA: Insufficient documentation

## 2020-06-27 DIAGNOSIS — G4730 Sleep apnea, unspecified: Secondary | ICD-10-CM | POA: Diagnosis not present

## 2020-06-27 DIAGNOSIS — E669 Obesity, unspecified: Secondary | ICD-10-CM | POA: Insufficient documentation

## 2020-06-27 DIAGNOSIS — R32 Unspecified urinary incontinence: Secondary | ICD-10-CM | POA: Insufficient documentation

## 2020-06-27 DIAGNOSIS — Z8042 Family history of malignant neoplasm of prostate: Secondary | ICD-10-CM | POA: Insufficient documentation

## 2020-06-27 DIAGNOSIS — M810 Age-related osteoporosis without current pathological fracture: Secondary | ICD-10-CM | POA: Insufficient documentation

## 2020-06-27 DIAGNOSIS — Z7982 Long term (current) use of aspirin: Secondary | ICD-10-CM | POA: Insufficient documentation

## 2020-06-27 DIAGNOSIS — K649 Unspecified hemorrhoids: Secondary | ICD-10-CM | POA: Insufficient documentation

## 2020-06-27 DIAGNOSIS — I872 Venous insufficiency (chronic) (peripheral): Secondary | ICD-10-CM | POA: Insufficient documentation

## 2020-06-27 DIAGNOSIS — M129 Arthropathy, unspecified: Secondary | ICD-10-CM | POA: Diagnosis not present

## 2020-06-27 DIAGNOSIS — Z905 Acquired absence of kidney: Secondary | ICD-10-CM | POA: Insufficient documentation

## 2020-06-27 DIAGNOSIS — I252 Old myocardial infarction: Secondary | ICD-10-CM | POA: Diagnosis not present

## 2020-06-27 DIAGNOSIS — R918 Other nonspecific abnormal finding of lung field: Secondary | ICD-10-CM | POA: Diagnosis not present

## 2020-06-27 DIAGNOSIS — K219 Gastro-esophageal reflux disease without esophagitis: Secondary | ICD-10-CM | POA: Diagnosis not present

## 2020-06-27 DIAGNOSIS — J8410 Pulmonary fibrosis, unspecified: Secondary | ICD-10-CM | POA: Insufficient documentation

## 2020-06-27 DIAGNOSIS — K5100 Ulcerative (chronic) pancolitis without complications: Secondary | ICD-10-CM | POA: Insufficient documentation

## 2020-06-27 DIAGNOSIS — N3281 Overactive bladder: Secondary | ICD-10-CM | POA: Insufficient documentation

## 2020-06-27 DIAGNOSIS — E559 Vitamin D deficiency, unspecified: Secondary | ICD-10-CM | POA: Insufficient documentation

## 2020-06-27 DIAGNOSIS — Z86010 Personal history of colonic polyps: Secondary | ICD-10-CM | POA: Insufficient documentation

## 2020-06-27 DIAGNOSIS — Z803 Family history of malignant neoplasm of breast: Secondary | ICD-10-CM | POA: Diagnosis not present

## 2020-06-27 DIAGNOSIS — I83019 Varicose veins of right lower extremity with ulcer of unspecified site: Secondary | ICD-10-CM | POA: Insufficient documentation

## 2020-06-27 DIAGNOSIS — Z79899 Other long term (current) drug therapy: Secondary | ICD-10-CM | POA: Diagnosis not present

## 2020-06-27 DIAGNOSIS — R739 Hyperglycemia, unspecified: Secondary | ICD-10-CM | POA: Insufficient documentation

## 2020-06-27 DIAGNOSIS — R197 Diarrhea, unspecified: Secondary | ICD-10-CM | POA: Insufficient documentation

## 2020-06-27 HISTORY — DX: Malignant neoplasm of retroperitoneum: C48.0

## 2020-06-27 NOTE — Progress Notes (Signed)
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 Liposarcoma of retroperitoneum (HCC)  C48.0 CANCELED: MR Abdomen W Wo Contrast    HISTORY OF PRESENT ILLNESS: Isabella [name] is a 80 y.o. female seen at the request of Dr. Alinda Money. She has a history significant for sarcoidosis. She was initially noted to have an area of fat necrosis involving left renal fat (3 cm), as well as pulmonary nodules, in 01/2018 during workup for radiating chest pain. 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 She continued under follow up with Dr. [name] for the pulmonary nodules. Follow up chest CT on 12/09/19 again showed enlargement of the lesion, as well as new calcifications. The calcifications were considered somewhat atypical for angiomyolipoma, and additional imaging was recommended. She proceeded to abdomen CT on 01/03/2020 showing:enlarging fatty lesion in left perirenal space (now 6.7 cm) with features concerning for retroperitoneal liposarcoma.  She was referred to Dr. Alinda Money on 02/22/20, who recommended MRI and possible biopsy. Abdomen MRI performed on 03/02/20 showed stability in size of the mass in the left lateral perinephric space, noting it does not appear to arise from left renal parenchyma. Biopsy of the mass was performed on 03/14/20, and pathology revealed well-differentiated liposarcoma , sclerosing  subtype.  Her case was presented at the multidisciplinary GU tumor board, and resection with left nephrectomy was recommended. The procedure was performed on 04/24/20, and pathology from the procedure revealed: sclerosing well-differentiated liposarcoma of retroperitoneum, grade 1, involving perirenal and periadrenal soft tissue, 8 cm (pT2); neoplasm present at superior lateral margin.  She has reviewed the pathology results with her urologist and has been kindly referred to [place] today for discussion of possible radiation therapy treatment to the positive margin.  PREVIOUS RADIATION THERAPY: No  PAST MEDICAL HISTORY:  Past Medical History:  Diagnosis Date  . Arthritis   . Complication of anesthesia    Took a while to wake up with knee. Avoid pancuronium  . Coronary artery disease    MI 2012, 2019, Dr. [name]  . Dyspnea    with walking at times  . Fallen bladder   . GERD (gastroesophageal reflux disease)    No problems currently  . 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 Headache    migraines in her younger years  . Hemorrhoids   . High cholesterol   . History of blood transfusion   . Hx of adenomatous colonic polyps   . Hypertension   . Liposarcoma of retroperitoneum ([place])   . Lung nodule   . Lymphedema    BLE  . Myocardial infarction [facility])    x2 2012 and 2019  . Nocturnal leg cramps   . Obesity   . Sleep apnea    uses cpap      PAST SURGICAL HISTORY: Past Surgical History:  Procedure Laterality  Date  . ABDOMINAL HYSTERECTOMY  1990  . APPENDECTOMY  1970  . BACK SURGERY  2015   lower back  . CARDIAC CATHETERIZATION  03/2011  . CARPAL TUNNEL RELEASE Bilateral 2004  . CATARACT EXTRACTION    . CHOLECYSTECTOMY  1970  . COLONOSCOPY W/ BIOPSIES AND POLYPECTOMY    . CORONARY ANGIOPLASTY  2012  . CORONARY STENT PLACEMENT  03/24/2011   Dr. [name]  . EYE SURGERY  2008,2011   corneal  transplant  . FEMORAL ARTERY EXPLORATION  03/25/2011   Procedure: FEMORAL ARTERY EXPLORATION;  Surgeon: Elam Dutch, MD;  Location: [place];  Service: Vascular;  Laterality: Right;  Evacuation of Hematoma   . hematoma surgery     from cardiac cath -hematoma in groin-right  . HERNIA REPAIR  4034   umbilical  . JOINT REPLACEMENT    . KNEE ARTHROSCOPY  2006   Right  . [facility] Left Eye  2012  . LEFT HEART CATH AND CORONARY ANGIOGRAPHY N/A 01/27/2018   Procedure: LEFT HEART CATH AND CORONARY ANGIOGRAPHY;  Surgeon: Belva Crome, MD;  Location: [place] CV LAB;  Service: Cardiovascular;  Laterality: N/A;  . LEFT HEART CATHETERIZATION WITH CORONARY ANGIOGRAM N/A 03/24/2011   Procedure: LEFT HEART CATHETERIZATION WITH CORONARY ANGIOGRAM;  Surgeon: Sinclair Grooms, MD;  Location[facility] C[facility] LAB;  Service: Cardiovascular;  Laterality: N/A;  . NEPHRECTOMY Left 04/24/2020   Procedure: OPEN RADICAL NEPHRECTOMY WITH RESECTION OF PERITONEAL MASS;  Surgeon: Raynelle Bring, MD;  Location: [facility];  Service: Urology;  Laterality: Left;  . PERCUTANEOUS CORONARY STENT INTERVENTION (PCI-S) N/A 03/24/2011   Procedure: PERCUTANEOUS CORONARY STENT INTERVENTION (PCI-S);  Surgeon: Sinclair Grooms, MD;  Location: [facility] CATH LAB;  Service: Cardiovascular;  Laterality: N/A;  . SHOULDER SURGERY  2010  . TONSILLECTOMY  1949  . TONSILLECTOMY AND ADENOIDECTOMY    . TOTAL KNEE ARTHROPLASTY  07/2010   Left  . TOTAL KNEE ARTHROPLASTY  06/17/2012   Procedure: TOTAL KNEE ARTHROPLASTY;  Surgeon: Gearlean Alf, MD;  Location: [facility];  Service: Orthopedics;  Laterality: Right;  . TUBAL LIGATION  1978  . [place], 2007   x 2  . VIDEO BRONCHOSCOPY WITH ENDOBRONCHIAL NAVIGATION  04/13/2012   Procedure: VIDEO BRONCHOSCOPY WITH ENDOBRONCHIAL NAVIGATION;  Surgeon: Melrose Nakayama, MD;  Location: [place];  Service: Thoracic;  Laterality: N/A;  . VIDEO BRONCHOSCOPY WITH ENDOBRONCHIAL NAVIGATION N/A  04/08/2016   Procedure: VIDEO BRONCHOSCOPY WITH ENDOBRONCHIAL NAVIGATION;  Surgeon: Rigoberto Noel, MD;  Location: [place];  Service: Thoracic;  Laterality: N/A;    FAMILY HISTORY:  Family History  Problem Relation Age of Onset  . Hypertension Mother   . Pancreatic cancer Father   . Breast cancer Maternal Grandmother   . Prostate cancer Maternal Uncle   . Cancer Paternal Aunt        unknown  . Cancer Paternal Uncle        unknown  . Cancer Maternal Uncle        unknown  . Cancer Maternal Uncle        unknown    SOCIAL HISTORY:  Social History   Socioeconomic History  . Marital status: Widowed    Spouse name: Not on file  . Number of children: Not on file  . Years of education: Not on file  . Highest education level: Not on file  Occupational History  . Occupation: Retired    Fish farm manager: RETIRED  Tobacco Use  .  Smoking status: Never Smoker  . Smokeless tobacco: Never Used  Vaping Use  . Vaping Use: Never used  Substance and Sexual Activity  . Alcohol use: No  . Drug use: No  . Sexual activity: Not Currently    Birth control/protection: Post-menopausal, Surgical    Comment: Hysterectomy  Other Topics Concern  . Not on file  Social History Narrative   Tobacco use cigarettes: never smoked, tobacco history last updated 12/23/2013. No smoking. No alcohol. Caffeine: yes, coffee, 3 servings daily.recreational drug use: never. No diet. Exercise: walks everyday. Occupation: retired. Marital status: married. Children: boys, 2 girls, 2. Seat belt use: yes.   Social Determinants of Health   Financial Resource Strain: Not on file  Food Insecurity: Not on file  Transportation Needs: Not on file  Physical Activity: Not on file  Stress: Not on file  Social Connections: Not on file  Intimate Partner Violence: Not on file    ALLERGIES: Codeine, Imodium a-d [loperamide hcl], Neomycin, Penicillinase, Penicillins, Tramadol, Lisinopril, Hornet venom, Adhesive [tape], Erythromycin,  Hydromorphone, and Percocet [oxycodone-acetaminophen]  MEDICATIONS:  Current Outpatient Medications  Medication Sig Dispense Refill  . acetaminophen (TYLENOL) 500 MG tablet Take 500 mg by mouth every 6 (six) hours as needed for mild pain.    Marland Kitchen aspirin EC 81 MG tablet Take 81 mg by mouth daily at 2 PM.     . atorvastatin (LIPITOR) 80 MG tablet Take 1 tablet (80 mg total) by mouth daily. (Patient taking differently: Take 80 mg by mouth at bedtime.) 90 tablet 3  . chlorpheniramine (CHLOR-TRIMETON) 4 MG tablet Take 8 mg by mouth at bedtime.     . Cholecalciferol (D3 PO) Take 2,000 mg by mouth.    . furosemide (LASIX) 40 MG tablet Take 40 mg by mouth daily.    . mesalamine (APRISO) 0.375 g 24 hr capsule Take 1,500 mg by mouth daily.    . metoprolol tartrate (LOPRESSOR) 25 MG tablet Take 25 mg by mouth 2 (two) times daily.    . Multiple Vitamins-Minerals (MULTI FOR HER 50+) TABS 1 tablet    . nitroGLYCERIN (NITROSTAT) 0.4 MG SL tablet Place 1 tablet (0.4 mg total) under the tongue every 5 (five) minutes as needed for chest pain. 25 tablet 5  . oxybutynin (DITROPAN-XL) 10 MG 24 hr tablet Take 10 mg by mouth at bedtime.    . potassium chloride SA (K-DUR,KLOR-CON) 20 MEQ tablet Take 20 mEq by mouth daily with supper.     Marland Kitchen UNABLE TO FIND CPAP: At bedtime nightly and during all naps    . XELJANZ 5 MG TABS Take 5 mg by mouth 2 (two) times daily.     No current facility-administered medications for this encounter.    REVIEW OF SYSTEMS:  On review of systems, the patient reports that she is doing well overall. She denies any chest pain, shortness of breath, cough, fevers, chills, night sweats, unintended weight changes. She denies abdominal pain, nausea or vomiting. She reports urge incontinence managed with oxybutynin, which causes issues with constipation. She denies any new musculoskeletal or joint aches or pains. A complete review of systems is obtained and is otherwise negative.    PHYSICAL EXAM:   Wt Readings from Last 3 Encounters:  06/27/20 212 lb (96.2 kg)  04/24/20 223 lb 8.7 oz (101.4 kg)  04/18/20 220 lb 3 oz (99.9 kg)   Temp Readings from Last 3 Encounters:  06/27/20 (!) 97 F (36.1 C) (Temporal)  04/27/20 98.8 F (37.1 C) (Oral)  04/18/20 98.2 F (36.8 C) (Oral)   BP Readings from Last 3 Encounters:  06/27/20 (!) 175/91  04/27/20 (!) 119/53  04/18/20 (!) 166/69   Pulse Readings from Last 3 Encounters:  06/27/20 63  04/27/20 61  04/18/20 (!) 50    /10  In general this is a well appearing woman in no acute distress. She's alert and oriented x4 and appropriate throughout the examination. Cardiopulmonary assessment is negative for acute distress and she exhibits normal effort.     KPS = 90  100 - Normal; no complaints; no evidence of disease. 90   - Able to carry on normal activity; minor signs or symptoms of disease. 80   - Normal activity with effort; some signs or symptoms of disease. 78   - Cares for self; unable to carry on normal activity or to do active work. 60   - Requires occasional assistance, but is able to care for most of his personal needs. 50   - Requires considerable assistance and frequent medical care. 27   - Disabled; requires special care and assistance. 3   - Severely disabled; hospital admission is indicated although death not imminent. 90   - Very sick; hospital admission necessary; active supportive treatment necessary. 10   - Moribund; fatal processes progressing rapidly. 0     - Dead  Karnofsky DA, Abelmann [place], Craver LS and Burchenal [facility] [phone]) The use of the nitrogen mustards in the palliative treatment of carcinoma: with particular reference to bronchogenic carcinoma Cancer 1 634-56  LABORATORY DATA:  Lab Results  Component Value Date   WBC 4.6 04/18/2020   HGB 10.5 (L) 04/27/2020   HCT 31.7 (L) 04/27/2020   MCV 96.1 04/18/2020   PLT 217 04/18/2020   Lab Results  Component Value Date   NA 141 04/27/2020   K 3.4 (L)  04/27/2020   CL 104 04/27/2020   CO2 27 04/27/2020   Lab Results  Component Value Date   ALT 27 10/26/2017   AST 34 10/26/2017   ALKPHOS 62 10/26/2017   BILITOT 0.5 10/26/2017     RADIOGRAPHY: DG BONE DENSITY (DXA)  Result Date: 06/12/2020 EXAM: DUAL X-RAY ABSORPTIOMETRY (DXA) FOR BONE MINERAL DENSITY IMPRESSION: Referring Physician:  Glenis Smoker Your patient completed a BMD test using Lunar IDXA DXA system ( analysis version: 16 ) manufactured by [employer]. Technologist: AD PATIENT: Name: Jaqlyn, Gruenhagen Patient ID: [id number] Birth Date: 1940/12/25 Height: 60.8 in. Sex: Female Measured: 06/12/2020 Weight: 205.0 lbs. Indications: Advanced Age, Caucasian, Estrogen Deficient, Hysterectomy, Kidney Cancer, Postmenopausal Fractures: None Treatments: Calcium (E943.0), Multivitamin, Vitamin D (E933.5) ASSESSMENT: The BMD measured at Femur Neck Left is 1.028 g/cm2 with a T-score of -0.1. This patient is considered normal according to [facility]) criteria. The scan quality is good. Lumbar spine was not utilized due to surgical hardware. Site Region Measured Date Measured Age YA BMD Significant CHANGE T-score DualFemur Neck Left 06/12/2020 77.4 -0.1 1.028 g/cm2 * DualFemur Neck Left 10/28/2013 70.8 1.1 1.190 g/cm2 DualFemur Total Mean 06/12/2020 77.4 1.2 1.162 g/cm2 Left Forearm Radius 33% 06/12/2020 77.4 0.9 0.966 g/cm2 World Health Organization [facility]) criteria for post-menopausal, Caucasian Women: Normal       T-score at or above -1 SD Osteopenia   T-score between -1 and -2.5 SD Osteoporosis T-score at or below -2.5 SD RECOMMENDATION: 1. All patients should optimize calcium and vitamin D intake. 2. Consider FDA approved medical therapies in postmenopausal women and men aged 40 years and older,  based on the following: a. A hip or vertebral (clinical or morphometric) fracture b. T- score < or = -2.5 at the femoral neck or spine after appropriate evaluation to exclude secondary  causes c. Low bone mass (T-score between -1.0 and -2.5 at the femoral neck or spine) and a 10 year probability of a hip fracture > or = 3% or a 10 year probability of a major osteoporosis-related fracture > or = 20% based on the [place]-adapted WHO algorithm d. Clinician judgment and/or patient preferences may indicate treatment for people with 10-year fracture probabilities above or below these levels FOLLOW-UP: Patients with diagnosis of osteoporosis or at high risk for fracture should have regular bone mineral density tests. For patients eligible for Medicare, routine testing is allowed once every 2 years. The testing frequency can be increased to one year for patients who have rapidly progressing disease, those who are receiving or discontinuing medical therapy to restore bone mass, or have additional risk factors. I have reviewed this report and agree with the above findings. [facility] Electronically Signed   By: Ilona Sorrel M.D.   On: 06/12/2020 15:13      IMPRESSION/PLAN: 1. 80 y.o. 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  Today, we talked to the patient and her daughter about the findings and workup thus far. We discussed the natural history of low-grade liposarcoma and general treatment, highlighting the role of radiotherapy in the management. We discussed the available radiation techniques, and focused on the details and logistics of delivery. We reviewed the anticipated acute and late sequelae associated with radiation in this setting.  We also discussed the fact that she has low-grade, slow-growing disease, even in the setting of a positive surgical margin, given her advanced age, it might be reasonable to consider active surveillance with serial MRI scans for close monitoring which would certainly delay and possibly even prevent the need for radiation in  her lifetime.  The patient was encouraged to ask questions that were answered to her satisfaction.  At the end of our conversation, the patient is comfortable proceeding in active surveillance for now.  We will share our discussion with Dr. Alinda Money to confirm that he is in agreement with this plan and if so, we will go ahead and order an MRI abdomen to assess her postoperative response.  We will plan to call her with those results and further discuss treatment recommendations at that time but pending there is no significant residual tumor burden or concerning findings at the surgical site, she was then resume her routine follow-up under the care and direction of Dr. Alinda Money and we will continue to follow her progress via correspondence.  We enjoyed meeting her and her daughter today and look forward to continuing to follow her progress and participating in her care.    Nicholos Johns, PA-C    Tyler Pita, MD  [facility] Direct Dial: [phone]  Fax: [phone] [email or web address]  Skype  [email or web address]   This document serves as a record of services personally performed by Tyler Pita, MD and Freeman Caldron, PA-C. It was created on their behalf by Wilburn Mylar, a trained medical scribe. The creation of this record is based on the scribe's personal observations and the provider's statements to them. This document has been checked and approved by the attending provider.

## 2020-06-28 ENCOUNTER — Other Ambulatory Visit: Payer: Self-pay | Admitting: Urology

## 2020-06-28 DIAGNOSIS — C480 Malignant neoplasm of retroperitoneum: Secondary | ICD-10-CM

## 2020-06-29 ENCOUNTER — Telehealth: Payer: Self-pay | Admitting: Radiation Oncology

## 2020-06-29 NOTE — Telephone Encounter (Signed)
Received voicemail message from patient that the first available appointment at [facility] for her MRI is July 23, 2020 at 1100. Inquired with Ashlyn Bruning, PA-C about time frame. Per Ashlyn, PA-C an MRI within the next two weeks is ideal. Reached out to [employer], scheduler, for assistance. Received voicemail message from patient explaining that [facility] just phoned her and her new MRI appointment is for July 03, 2020 at 1000.

## 2020-07-03 ENCOUNTER — Other Ambulatory Visit: Payer: Self-pay

## 2020-07-03 ENCOUNTER — Ambulatory Visit
Admission: RE | Admit: 2020-07-03 | Discharge: 2020-07-03 | Disposition: A | Discharge status: Home / Self Care | Payer: Medicare Other | Source: Ambulatory Visit | Attending: Urology | Admitting: Urology

## 2020-07-03 DIAGNOSIS — N281 Cyst of kidney, acquired: Secondary | ICD-10-CM | POA: Diagnosis not present

## 2020-07-03 DIAGNOSIS — D869 Sarcoidosis, unspecified: Secondary | ICD-10-CM | POA: Diagnosis not present

## 2020-07-03 DIAGNOSIS — C480 Malignant neoplasm of retroperitoneum: Secondary | ICD-10-CM

## 2020-07-03 DIAGNOSIS — K862 Cyst of pancreas: Secondary | ICD-10-CM | POA: Diagnosis not present

## 2020-07-03 DIAGNOSIS — N2889 Other specified disorders of kidney and ureter: Secondary | ICD-10-CM | POA: Diagnosis not present

## 2020-07-03 MED ORDER — GADOBENATE DIMEGLUMINE 529 MG/ML IV SOLN
20.0000 mL | Freq: Once | INTRAVENOUS | Status: AC | PRN
  Administered 2020-07-03: 20 mL via INTRAVENOUS

## 2020-07-05 ENCOUNTER — Telehealth: Payer: Self-pay | Admitting: Radiation Oncology

## 2020-07-05 NOTE — Telephone Encounter (Signed)
Phoned the patient as requested by Freeman Caldron, PA-C and read the following: Isabella [name],  Please call the patient to let her know that her post-op MRI scan looks good and appears to have post-operative changes only. We do not see anything concerning for large volume residual tumor or recurrence of disease. I have sent a copy of this report to Dr. Alinda Money and he is on board with the plan to get another MRI in approximately 3-4 months to continue to monitor the postoperative site. We will plan to alternate follow up visits with Dr. Alinda Money so once we hear back from him regarding her next scan, we will make the appropriate arrangements for repeat imaging and follow up in the radiation clinic.  Thank you!  -Ashlyn  Patient verbalized understanding. Patient verbalized that "this good news is a blessing from God." Patient reports she has an appointment already with Dr. Alinda Money in May and understands she should have an MRI around that time. Patient understands she can contact this RN with future needs.

## 2020-07-23 ENCOUNTER — Other Ambulatory Visit: Payer: Medicare Other

## 2020-08-14 DIAGNOSIS — I9589 Other hypotension: Secondary | ICD-10-CM | POA: Diagnosis not present

## 2020-08-14 DIAGNOSIS — E7800 Pure hypercholesterolemia, unspecified: Secondary | ICD-10-CM | POA: Diagnosis not present

## 2020-08-14 DIAGNOSIS — M810 Age-related osteoporosis without current pathological fracture: Secondary | ICD-10-CM | POA: Diagnosis not present

## 2020-08-14 DIAGNOSIS — I700 Atherosclerosis of aorta: Secondary | ICD-10-CM | POA: Diagnosis not present

## 2020-08-18 DIAGNOSIS — N3000 Acute cystitis without hematuria: Secondary | ICD-10-CM | POA: Diagnosis not present

## 2020-08-18 DIAGNOSIS — R310 Gross hematuria: Secondary | ICD-10-CM | POA: Diagnosis not present

## 2020-08-18 DIAGNOSIS — R350 Frequency of micturition: Secondary | ICD-10-CM | POA: Diagnosis not present

## 2020-08-18 DIAGNOSIS — D49512 Neoplasm of unspecified behavior of left kidney: Secondary | ICD-10-CM | POA: Diagnosis not present

## 2020-08-18 DIAGNOSIS — N390 Urinary tract infection, site not specified: Secondary | ICD-10-CM | POA: Diagnosis not present

## 2020-08-28 DIAGNOSIS — N3000 Acute cystitis without hematuria: Secondary | ICD-10-CM | POA: Diagnosis not present

## 2020-09-12 DIAGNOSIS — C480 Malignant neoplasm of retroperitoneum: Secondary | ICD-10-CM | POA: Diagnosis not present

## 2020-09-22 ENCOUNTER — Other Ambulatory Visit: Payer: Self-pay | Admitting: Urology

## 2020-09-22 DIAGNOSIS — C480 Malignant neoplasm of retroperitoneum: Secondary | ICD-10-CM

## 2020-09-25 ENCOUNTER — Ambulatory Visit
Admission: RE | Admit: 2020-09-25 | Discharge: 2020-09-25 | Disposition: A | Discharge status: Home / Self Care | Payer: Medicare Other | Source: Ambulatory Visit | Attending: Urology | Admitting: Urology

## 2020-09-25 DIAGNOSIS — D869 Sarcoidosis, unspecified: Secondary | ICD-10-CM | POA: Diagnosis not present

## 2020-09-25 DIAGNOSIS — K7689 Other specified diseases of liver: Secondary | ICD-10-CM | POA: Diagnosis not present

## 2020-09-25 DIAGNOSIS — K8689 Other specified diseases of pancreas: Secondary | ICD-10-CM | POA: Diagnosis not present

## 2020-09-25 DIAGNOSIS — Z8589 Personal history of malignant neoplasm of other organs and systems: Secondary | ICD-10-CM | POA: Diagnosis not present

## 2020-09-25 DIAGNOSIS — C480 Malignant neoplasm of retroperitoneum: Secondary | ICD-10-CM

## 2020-09-25 DIAGNOSIS — I700 Atherosclerosis of aorta: Secondary | ICD-10-CM | POA: Diagnosis not present

## 2020-09-25 DIAGNOSIS — K862 Cyst of pancreas: Secondary | ICD-10-CM | POA: Diagnosis not present

## 2020-09-25 DIAGNOSIS — I2510 Atherosclerotic heart disease of native coronary artery without angina pectoris: Secondary | ICD-10-CM | POA: Diagnosis not present

## 2020-09-25 DIAGNOSIS — N281 Cyst of kidney, acquired: Secondary | ICD-10-CM | POA: Diagnosis not present

## 2020-09-25 MED ORDER — GADOBENATE DIMEGLUMINE 529 MG/ML IV SOLN
20.0000 mL | Freq: Once | INTRAVENOUS | Status: AC | PRN
  Administered 2020-09-25: 20 mL via INTRAVENOUS

## 2020-09-26 DIAGNOSIS — C480 Malignant neoplasm of retroperitoneum: Secondary | ICD-10-CM | POA: Diagnosis not present

## 2020-11-15 ENCOUNTER — Other Ambulatory Visit: Payer: Self-pay | Admitting: Thoracic Surgery (Cardiothoracic Vascular Surgery)

## 2020-11-15 DIAGNOSIS — R918 Other nonspecific abnormal finding of lung field: Secondary | ICD-10-CM

## 2021-01-10 NOTE — Progress Notes (Signed)
Cardiology Office Note:    Date:  01/11/2021   ID:  Isabella, [name] April 12, 1941, MRN [id number]  PCP:  Glenis Smoker, MD  Cardiologist:  Sinclair Grooms, MD   Referring MD: Glenis Smoker, *   Chief Complaint  Patient presents with   Coronary Artery Disease    History of Present Illness:    Isabella [name] is a 80 y.o. female with a hx of CAD (with scad) s/p STEMI in 2012 treated with DES to the RCA x2 proximal for catheter induced dissection and distal to treat acute lesion, ulcerative colitis, HTN, HLD, chronic diastolic CHF, chronic LE swelling with DVT (not on [facility]) and pulmonary sarcoidosis with biopsy.   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  Tolerated procedure without cardiovascular complications.  Antiplatelet therapy was paused without difficulty.  Now back on baby aspirin 81 mg/day.  Denies angina, chest pain, orthopnea, PND, and affirms chronic bilateral lower extremity edema which today is better than usual.  No medication side effects.  Past Medical History:  Diagnosis Date   Arthritis    Complication of anesthesia    Took a while to wake up with knee. Avoid pancuronium   Coronary artery disease    MI 2012, 2019, Dr. 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[place])    sarcoidosis   H/O cornea transplant 2012 and 2013   bilateral (states it was a partial)   Headache    migraines in her younger years   Hemorrhoids    High cholesterol    History of blood transfusion    Hx of adenomatous colonic polyps    Hypertension    Liposarcoma of retroperitoneum ([place])    Lung nodule    Lymphedema    BLE   Myocardial infarction ([place])    x2 2012 and 2019   Nocturnal leg cramps    Obesity    Sleep apnea    uses cpap    Past Surgical History:  Procedure Laterality Date   [place]  2015   lower back   CARDIAC CATHETERIZATION  03/2011   CARPAL TUNNEL RELEASE Bilateral 2004   CATARACT EXTRACTION     CHOLECYSTECTOMY  1970   COLONOSCOPY W/ BIOPSIES AND POLYPECTOMY     CORONARY ANGIOPLASTY  2012   CORONARY STENT PLACEMENT  03/24/2011   Dr. [name]   EYE SURGERY  [phone]   corneal transplant   FEMORAL ARTERY EXPLORATION  03/25/2011   Procedure: FEMORAL ARTERY EXPLORATION;  Surgeon: Elam Dutch, MD;  Location: [place];  Service: Vascular;  Laterality: Right;  Evacuation of Hematoma    hematoma surgery     from cardiac cath -hematoma in groin-right   HERNIA REPAIR  2297   umbilical   JOINT REPLACEMENT     KNEE ARTHROSCOPY  2006   Right   Laser Surgery Left Eye  2012   LEFT HEART CATH AND CORONARY ANGIOGRAPHY N/A 01/27/2018   Procedure: LEFT HEART CATH AND CORONARY ANGIOGRAPHY;  Surgeon: Belva Crome, MD;  Location: [place] [facility];  Service: Cardiovascular;  Laterality: N/A;   LEFT HEART CATHETERIZATION WITH CORONARY ANGIOGRAM N/A 03/24/2011   Procedure: LEFT HEART CATHETERIZATION WITH CORONARY ANGIOGRAM;  Surgeon: Lynnell Dike  Blenda Bridegroom, MD;  Location: [facility] CATH LAB;  Service: Cardiovascular;  Laterality: N/A;   NEPHRECTOMY Left 04/24/2020   Procedure: OPEN RADICAL NEPHRECTOMY WITH RESECTION OF PERITONEAL MASS;  Surgeon: Raynelle Bring, MD;  Location: [facility];  Service: Urology;  Laterality: Left;   PERCUTANEOUS CORONARY STENT INTERVENTION (PCI-S) N/A 03/24/2011   Procedure: PERCUTANEOUS CORONARY STENT INTERVENTION (PCI-S);  Surgeon: Sinclair Grooms, MD;  Location: [facility] CATH LAB;  Service: Cardiovascular;  Laterality: N/A;   SHOULDER SURGERY  2010   TONSILLECTOMY  1949   TONSILLECTOMY AND ADENOIDECTOMY     TOTAL KNEE ARTHROPLASTY  07/2010   Left   TOTAL KNEE ARTHROPLASTY  06/17/2012   Procedure: TOTAL KNEE ARTHROPLASTY;  Surgeon: Gearlean Alf, MD;  Location: [facility];  Service: Orthopedics;  Laterality: Right;    TUBAL LIGATION  7209   UMBILICAL HERNIA REPAIR  1990, 2007   x 2   VIDEO BRONCHOSCOPY WITH ENDOBRONCHIAL NAVIGATION  04/13/2012   Procedure: VIDEO BRONCHOSCOPY WITH ENDOBRONCHIAL NAVIGATION;  Surgeon: Melrose Nakayama, MD;  Location: [place];  Service: Thoracic;  Laterality: N/A;   VIDEO BRONCHOSCOPY WITH ENDOBRONCHIAL NAVIGATION N/A 04/08/2016   Procedure: VIDEO BRONCHOSCOPY WITH ENDOBRONCHIAL NAVIGATION;  Surgeon: Rigoberto Noel, MD;  Location: [facility];  Service: Thoracic;  Laterality: N/A;    Current Medications: Current Meds  Medication Sig   acetaminophen (TYLENOL) 500 MG tablet Take 500 mg by mouth every 6 (six) hours as needed for mild pain.   aspirin EC 81 MG tablet Take 81 mg by mouth daily at 2 PM.    atorvastatin (LIPITOR) 80 MG tablet Take 1 tablet (80 mg total) by mouth daily. (Patient taking differently: Take 80 mg by mouth at bedtime.)   chlorpheniramine (CHLOR-TRIMETON) 4 MG tablet Take 8 mg by mouth at bedtime.    Cholecalciferol (D3 PO) Take 2,000 mg by mouth.   furosemide (LASIX) 40 MG tablet Take 40 mg by mouth daily.   mesalamine (APRISO) 0.375 g 24 hr capsule Take 1,500 mg by mouth daily.   metoprolol tartrate (LOPRESSOR) 12.5 mg TABS tablet Take 12.5 mg by mouth 2 (two) times daily.   Multiple Vitamins-Minerals (MULTI FOR HER 50+) TABS 1 tablet   nitroGLYCERIN (NITROSTAT) 0.4 MG SL tablet Place 1 tablet (0.4 mg total) under the tongue every 5 (five) minutes as needed for chest pain.   oxybutynin (DITROPAN-XL) 10 MG 24 hr tablet Take 10 mg by mouth at bedtime.   potassium chloride SA (K-DUR,KLOR-CON) 20 MEQ tablet Take 20 mEq by mouth daily with supper.    UNABLE TO FIND CPAP: At bedtime nightly and during all naps   XELJANZ 5 MG TABS Take 5 mg by mouth daily.     Allergies:   Codeine, Imodium a-d [loperamide hcl], Neomycin, Penicillinase, Penicillins, Tramadol, Lisinopril, Hornet venom, Adhesive [tape], Erythromycin, Hydromorphone, and Percocet  [oxycodone-acetaminophen]   Social History   Socioeconomic History   Marital status: Widowed    Spouse name: Not on file   Number of children: Not on file   Years of education: Not on file   Highest education level: Not on file  Occupational History   Occupation: Retired    Fish farm manager: RETIRED  Tobacco Use   Smoking status: Never   Smokeless tobacco: Never  Vaping Use   Vaping Use: Never used  Substance and Sexual Activity   Alcohol use: No   Drug use: No   Sexual activity: Not Currently    Birth control/protection: Post-menopausal, Surgical  Comment: Hysterectomy  Other Topics Concern   Not on file  Social History Narrative   Tobacco use cigarettes: never smoked, tobacco history last updated 12/23/2013. No smoking. No alcohol. Caffeine: yes, coffee, 3 servings daily.recreational drug use: never. No diet. Exercise: walks everyday. Occupation: retired. Marital status: married. Children: boys, 2 girls, 2. Seat belt use: yes.   Social Determinants of Health   Financial Resource Strain: Not on file  Food Insecurity: Not on file  Transportation Needs: Not on file  Physical Activity: Not on file  Stress: Not on file  Social Connections: Not on file     Family History: The patient's family history includes Breast cancer in her maternal grandmother; Cancer in her maternal uncle, maternal uncle, paternal aunt, and paternal uncle; Hypertension in her mother; Pancreatic cancer in her father; Prostate cancer in her maternal uncle.  ROS:   Please see the history of present illness.    Dr. [name] identified a growth in the left kidney during CT follow-up of a lung nodule.  Dr. Alinda Money eventually did left nephrectomy.  No blood in the urine or stool.  All other systems reviewed and are negative.  EKGs/Labs/Other Studies Reviewed:    The following studies were reviewed today: No new data   EKG:  EKG sinus rhythm, first-degree AV block, biatrial abnormality.  PR interval 226 ms.   Compared to prior 10/12/2019, PR interval is slightly longer than compared to 220 at that time.  Recent Labs: 04/18/2020: Platelets 217 04/27/2020: BUN 20; Creatinine, Ser 1.22; Hemoglobin 10.5; Potassium 3.4; Sodium 141  Recent Lipid Panel    Component Value Date/Time   CHOL 113 01/27/2018 0806   TRIG 35 01/27/2018 0806   HDL 60 01/27/2018 0806   CHOLHDL 1.9 01/27/2018 0806   VLDL 7 01/27/2018 0806   LDLCALC 46 01/27/2018 0806    Physical Exam:    VS:  BP 123/80   Pulse 63   Ht 5' 2"  (1.575 m)   Wt 200 lb 6.4 oz (90.9 kg)   SpO2 96%   BMI 36.65 kg/m     Wt Readings from Last 3 Encounters:  01/11/21 200 lb 6.4 oz (90.9 kg)  06/27/20 212 lb (96.2 kg)  04/24/20 223 lb 8.7 oz (101.4 kg)     GEN: Overweight. No acute distress HEENT: Normal NECK: No JVD. LYMPHATICS: No lymphadenopathy CARDIAC: No murmur. RRR no gallop, or edema. VASCULAR:  Normal Pulses. No bruits. RESPIRATORY:  Clear to auscultation without rales, wheezing or rhonchi  ABDOMEN: Soft, non-tender, non-distended, No pulsatile mass, MUSCULOSKELETAL: No deformity  SKIN: Warm and dry NEUROLOGIC:  Alert and oriented x 3 PSYCHIATRIC:  Normal affect   ASSESSMENT:    1. Chronic diastolic CHF (congestive heart failure) ([place])   2. Atherosclerosis of native coronary artery of native heart without angina pectoris   3. Essential hypertension   4. Obesity (BMI 30-39.9)   5. Hyperlipidemia with target LDL less than 70    PLAN:    In order of problems listed above:  Continue same therapy.  No evidence of volume overload. Secondary prevention reviewed.  Continue same therapy. Excellent blood pressure control on current therapy.  Continue metoprolol 25 mg twice daily and furosemide 40 mg/day. Encouraged exercise. Continue atorvastatin 80 mg/day.  Most recent LDL was 46 and September   Overall education and awareness concerning primary/secondary risk prevention was discussed in detail: LDL less than 70, hemoglobin  A1c less than 7, blood pressure target less than 130/80 mmHg, >150 minutes of  moderate aerobic activity per week, avoidance of smoking, weight control (via diet and exercise), and continued surveillance/management of/for obstructive sleep apnea.    Medication Adjustments/Labs and Tests Ordered: Current medicines are reviewed at length with the patient today.  Concerns regarding medicines are outlined above.  No orders of the defined types were placed in this encounter.  No orders of the defined types were placed in this encounter.   Patient Instructions  Medication Instructions:  Your physician recommends that you continue on your current medications as directed. Please refer to the Current Medication list given to you today.  *If you need a refill on your cardiac medications before your next appointment, please call your pharmacy*   Lab Work: None If you have labs (blood work) drawn today and your tests are completely normal, you will receive your results only by: [place] (if you have MyChart) OR A paper copy in the mail If you have any lab test that is abnormal or we need to change your treatment, we will call you to review the results.   Testing/Procedures: None   Follow-Up: At [facility], you and your health needs are our priority.  As part of our continuing mission to provide you with exceptional heart care, we have created designated Provider Care Teams.  These Care Teams include your primary Cardiologist (physician) and Advanced Practice Providers (APPs -  Physician Assistants and Nurse Practitioners) who all work together to provide you with the care you need, when you need it.  We recommend signing up for the patient portal called "MyChart".  Sign up information is provided on this After Visit Summary.  MyChart is used to connect with patients for Virtual Visits (Telemedicine).  Patients are able to view lab/test results, encounter notes, upcoming appointments,  etc.  Non-urgent messages can be sent to your provider as well.   To learn more about what you can do with MyChart, go to [email or web address].    Your next appointment:   1 year(s)  The format for your next appointment:   In Person  Provider:   You may see Sinclair Grooms, MD or one of the following Advanced Practice Providers on your designated Care Team:   Cecilie Kicks, NP   Other Instructions     Signed, Sinclair Grooms, MD  01/11/2021 5:50 PM    Reform

## 2021-01-11 ENCOUNTER — Encounter: Payer: Self-pay | Admitting: Interventional Cardiology

## 2021-01-11 ENCOUNTER — Other Ambulatory Visit: Payer: Self-pay

## 2021-01-11 ENCOUNTER — Ambulatory Visit: Payer: Medicare Other | Admitting: Interventional Cardiology

## 2021-01-11 VITALS — BP 123/80 | HR 63 | Ht 62.0 in | Wt 200.4 lb

## 2021-01-11 DIAGNOSIS — I2510 Atherosclerotic heart disease of native coronary artery without angina pectoris: Secondary | ICD-10-CM

## 2021-01-11 DIAGNOSIS — E785 Hyperlipidemia, unspecified: Secondary | ICD-10-CM

## 2021-01-11 DIAGNOSIS — I10 Essential (primary) hypertension: Secondary | ICD-10-CM | POA: Diagnosis not present

## 2021-01-11 DIAGNOSIS — I5032 Chronic diastolic (congestive) heart failure: Secondary | ICD-10-CM

## 2021-01-11 DIAGNOSIS — E669 Obesity, unspecified: Secondary | ICD-10-CM

## 2021-01-11 NOTE — Patient Instructions (Signed)
Medication Instructions:  Your physician recommends that you continue on your current medications as directed. Please refer to the Current Medication list given to you today.  *If you need a refill on your cardiac medications before your next appointment, please call your pharmacy*   Lab Work: None If you have labs (blood work) drawn today and your tests are completely normal, you will receive your results only by: [place] (if you have MyChart) OR A paper copy in the mail If you have any lab test that is abnormal or we need to change your treatment, we will call you to review the results.   Testing/Procedures: None   Follow-Up: At [facility], you and your health needs are our priority.  As part of our continuing mission to provide you with exceptional heart care, we have created designated Provider Care Teams.  These Care Teams include your primary Cardiologist (physician) and Advanced Practice Providers (APPs -  Physician Assistants and Nurse Practitioners) who all work together to provide you with the care you need, when you need it.  We recommend signing up for the patient portal called "MyChart".  Sign up information is provided on this After Visit Summary.  MyChart is used to connect with patients for Virtual Visits (Telemedicine).  Patients are able to view lab/test results, encounter notes, upcoming appointments, etc.  Non-urgent messages can be sent to your provider as well.   To learn more about what you can do with MyChart, go to [email or web address].    Your next appointment:   1 year(s)  The format for your next appointment:   In Person  Provider:   You may see Sinclair Grooms, MD or one of the following Advanced Practice Providers on your designated Care Team:   Cecilie Kicks, NP   Other Instructions

## 2021-01-12 ENCOUNTER — Ambulatory Visit
Admission: RE | Admit: 2021-01-12 | Discharge: 2021-01-12 | Disposition: A | Discharge status: Home / Self Care | Payer: Medicare Other | Source: Ambulatory Visit | Attending: Thoracic Surgery (Cardiothoracic Vascular Surgery) | Admitting: Thoracic Surgery (Cardiothoracic Vascular Surgery)

## 2021-01-12 DIAGNOSIS — I700 Atherosclerosis of aorta: Secondary | ICD-10-CM | POA: Diagnosis not present

## 2021-01-12 DIAGNOSIS — R911 Solitary pulmonary nodule: Secondary | ICD-10-CM | POA: Diagnosis not present

## 2021-01-12 DIAGNOSIS — R918 Other nonspecific abnormal finding of lung field: Secondary | ICD-10-CM | POA: Diagnosis not present

## 2021-01-16 ENCOUNTER — Other Ambulatory Visit: Payer: Self-pay

## 2021-01-16 ENCOUNTER — Encounter: Payer: Self-pay | Admitting: Thoracic Surgery (Cardiothoracic Vascular Surgery)

## 2021-01-16 ENCOUNTER — Ambulatory Visit: Payer: Medicare Other | Admitting: Thoracic Surgery (Cardiothoracic Vascular Surgery)

## 2021-01-16 VITALS — BP 168/80 | HR 68 | Resp 20 | Ht 62.0 in | Wt 202.0 lb

## 2021-01-16 DIAGNOSIS — R911 Solitary pulmonary nodule: Secondary | ICD-10-CM

## 2021-01-16 NOTE — Progress Notes (Signed)
[address]       [place] [address]             [phone]       HPI: [name] returns for follow-up of multiple lung nodules  Isabella [name] is a 80 year old woman with a history of multiple lung nodules, sarcoidosis, ulcerative colitis, coronary disease, reflux, sleep apnea, arthritis, hypertension, lymphedema, and liposarcoma of the retroperitoneum.  She has had lung nodules for about a decade.  She had a left lower lobe lung mass in 2013.  A CT-guided biopsy showed noncaseating granulomas.  That nodule ultimately resolved.  She has had other waxing and waning nodules.  In 2018 there was a nodule that showed activity on PET.  CT-guided biopsy showed vague granulomatous inflammation but no evidence of malignancy.  She has been followed radiographically.  I saw her in August 2021.  She had a fat density mass with calcification adjacent to the left kidney.  Specific CT for that showed findings concerning for a liposarcoma.  She was referred to general surgery and then ultimately to Dr. Alinda Money.  He did a open left radical nephrectomy on 04/24/2020.  The mass turned out to be a liposarcoma.  She saw Dr. Tammi Klippel but they decided to hold off on radiation.  She is feeling well.  She has not had any respiratory issues recently.  Appetite is good and weight is stable.  Past Medical History:  Diagnosis Date   Arthritis    Complication of anesthesia    Took a while to wake up with knee. Avoid pancuronium   Coronary artery disease    MI 2012, 2019, Dr. [name]   Dyspnea    with walking at times   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[place])    sarcoidosis   H/O cornea transplant 2012 and 2013   bilateral (states it was a partial)   Headache    migraines in her younger years   Hemorrhoids    High cholesterol    History of blood transfusion    Hx of adenomatous colonic polyps    Hypertension     Liposarcoma of retroperitoneum ([facility])    Lung nodule    Lymphedema    BLE   Myocardial infarction ([place])    x2 2012 and 2019   Nocturnal leg cramps    Obesity    Sleep apnea    uses cpap    Current Outpatient Medications  Medication Sig Dispense Refill   acetaminophen (TYLENOL) 500 MG tablet Take 500 mg by mouth every 6 (six) hours as needed for mild pain.     aspirin EC 81 MG tablet Take 81 mg by mouth daily at 2 PM.      atorvastatin (LIPITOR) 80 MG tablet Take 1 tablet (80 mg total) by mouth daily. (Patient taking differently: Take 80 mg by mouth at bedtime.) 90 tablet 3   chlorpheniramine (CHLOR-TRIMETON) 4 MG tablet Take 8 mg by mouth at bedtime.      Cholecalciferol (D3 PO) Take 2,000 mg by mouth.     furosemide (LASIX) 40 MG tablet Take 40 mg by mouth daily.     mesalamine (APRISO) 0.375 g 24 hr capsule Take 1,500 mg by mouth daily.     metoprolol tartrate (LOPRESSOR) 12.5 mg TABS tablet Take 12.5 mg by mouth 2 (two) times daily.     Multiple Vitamins-Minerals (MULTI  FOR HER 50+) TABS 1 tablet     nitroGLYCERIN (NITROSTAT) 0.4 MG SL tablet Place 1 tablet (0.4 mg total) under the tongue every 5 (five) minutes as needed for chest pain. 25 tablet 5   oxybutynin (DITROPAN-XL) 10 MG 24 hr tablet Take 10 mg by mouth at bedtime.     potassium chloride SA (K-DUR,KLOR-CON) 20 MEQ tablet Take 20 mEq by mouth daily with supper.      UNABLE TO FIND CPAP: At bedtime nightly and during all naps     XELJANZ 5 MG TABS Take 5 mg by mouth daily.     No current facility-administered medications for this visit.    Physical Exam BP (!) 168/80   Pulse 68   Resp 20   Ht 5' 2"  (1.575 m)   Wt 202 lb (91.6 kg)   SpO2 91% Comment: RA  BMI 36.95 kg/m  Obese 80 year old woman in no acute distress Alert and oriented x3 with no focal deficits Lungs clear with equal breath sounds bilaterally Cardiac regular rate and rhythm No cervical supraclavicular adenopathy  Diagnostic Tests: CT CHEST  WITHOUT CONTRAST   TECHNIQUE: Multidetector CT imaging of the chest was performed following the standard protocol without IV contrast.   COMPARISON:  12/09/2019   FINDINGS: Cardiovascular: The heart size is normal. No substantial pericardial effusion. Mild atherosclerotic calcification is noted in the wall of the thoracic aorta.   Mediastinum/Nodes: No mediastinal lymphadenopathy. No evidence for gross hilar lymphadenopathy although assessment is limited by the lack of intravenous contrast on the current study. The esophagus has normal imaging features. There is no axillary lymphadenopathy.   Lungs/Pleura: 1.6 x 1.4 cm right lower lobe nodule on 82/8 is stable from 1.6 x 1.5 cm previously.   Medial right lower lobe nodule measured previously at 1.5 x 1.4 cm is stable at 1.5 x 1.4 cm today on image 98/8.   A third tiny nodule in the posterior right lung apex measured previously at 5 mm is stable on 26/8.   Multiple additional tiny scattered bilateral pulmonary nodules are similar to prior, involving all lobes of both lungs. Plaque-like thickening in the lingula along the fissure is stable. No new suspicious pulmonary nodule or mass. No focal airspace consolidation. There is no evidence of pleural effusion.   Upper Abdomen: Hepatic cysts again noted. Cystic lesion seen in the tail of pancreas previously is similar today at 18 mm.   Musculoskeletal: No worrisome lytic or sclerotic osseous abnormality.   IMPRESSION: 1. Stable exam. Multiple bilateral pulmonary nodules are unchanged in the interval. No new suspicious nodule or mass. 2. Stable appearance of the cystic lesion in the tail of pancreas. Continued attention on follow-up recommended. 3. Hepatic cysts. 4. Aortic Atherosclerosis (ICD10-I70.0).     Electronically Signed   By: Misty Stanley M.D.   On: 01/12/2021 12:02  I personally reviewed the CT images.  Compared with her previous films.  No change in the  pulmonary nodules.  Impression: Isabella [name] is a 80 year old woman with a history of multiple lung nodules, sarcoidosis, ulcerative colitis, coronary disease, reflux, sleep apnea, arthritis, hypertension, lymphedema, and liposarcoma of the retroperitoneum.  Lung nodules-biopsies on 2 occasions showed granulomas.  Findings consistent with sarcoidosis.  Would continue with annual follow-up.  If Dr. Alinda Money is going to do CTs as part of his follow-up, then she can have the scan with him and see me afterwards.  For now we will go ahead and schedule her for a scan in a  year and then if he schedules one as well, then we will cancel our scan.  Liposarcoma of the retroperitoneum-T2, N0, grade 1.  Status post left nephrectomy.  Followed by Dr. Alinda Money and Dr. Tammi Klippel.  Plan: Return in 1 year with CT chest  Melrose Nakayama, MD [facility] [phone]

## 2021-01-16 NOTE — Addendum Note (Signed)
Addended byDanielle Dess on: 01/16/2021 07:49 AM   Modules accepted: Orders

## 2021-01-24 DIAGNOSIS — K5100 Ulcerative (chronic) pancolitis without complications: Secondary | ICD-10-CM | POA: Diagnosis not present

## 2021-02-01 DIAGNOSIS — G4733 Obstructive sleep apnea (adult) (pediatric): Secondary | ICD-10-CM | POA: Diagnosis not present

## 2021-02-27 ENCOUNTER — Other Ambulatory Visit: Payer: Self-pay | Admitting: *Deleted

## 2021-02-27 MED ORDER — ATORVASTATIN CALCIUM 80 MG PO TABS: 80.0000 mg | ORAL_TABLET | Freq: Every day | ORAL | 3 refills | Status: DC

## 2021-02-28 DIAGNOSIS — Z905 Acquired absence of kidney: Secondary | ICD-10-CM | POA: Diagnosis not present

## 2021-02-28 DIAGNOSIS — D500 Iron deficiency anemia secondary to blood loss (chronic): Secondary | ICD-10-CM | POA: Diagnosis not present

## 2021-02-28 DIAGNOSIS — I10 Essential (primary) hypertension: Secondary | ICD-10-CM | POA: Diagnosis not present

## 2021-02-28 DIAGNOSIS — I700 Atherosclerosis of aorta: Secondary | ICD-10-CM | POA: Diagnosis not present

## 2021-02-28 DIAGNOSIS — M810 Age-related osteoporosis without current pathological fracture: Secondary | ICD-10-CM | POA: Diagnosis not present

## 2021-02-28 DIAGNOSIS — Z0000 Encounter for general adult medical examination without abnormal findings: Secondary | ICD-10-CM | POA: Diagnosis not present

## 2021-02-28 DIAGNOSIS — N3281 Overactive bladder: Secondary | ICD-10-CM | POA: Diagnosis not present

## 2021-03-09 ENCOUNTER — Other Ambulatory Visit: Payer: Self-pay | Admitting: Urology

## 2021-03-09 DIAGNOSIS — C480 Malignant neoplasm of retroperitoneum: Secondary | ICD-10-CM

## 2021-03-23 DIAGNOSIS — C480 Malignant neoplasm of retroperitoneum: Secondary | ICD-10-CM | POA: Diagnosis not present

## 2021-03-25 ENCOUNTER — Ambulatory Visit
Admission: RE | Admit: 2021-03-25 | Discharge: 2021-03-25 | Disposition: A | Discharge status: Home / Self Care | Payer: Medicare Other | Source: Ambulatory Visit | Attending: Urology | Admitting: Urology

## 2021-03-25 ENCOUNTER — Other Ambulatory Visit: Payer: Self-pay | Admitting: Urology

## 2021-03-25 DIAGNOSIS — N281 Cyst of kidney, acquired: Secondary | ICD-10-CM | POA: Diagnosis not present

## 2021-03-25 DIAGNOSIS — R911 Solitary pulmonary nodule: Secondary | ICD-10-CM | POA: Diagnosis not present

## 2021-03-25 DIAGNOSIS — R935 Abnormal findings on diagnostic imaging of other abdominal regions, including retroperitoneum: Secondary | ICD-10-CM | POA: Diagnosis not present

## 2021-03-25 DIAGNOSIS — K862 Cyst of pancreas: Secondary | ICD-10-CM | POA: Diagnosis not present

## 2021-03-25 DIAGNOSIS — C480 Malignant neoplasm of retroperitoneum: Secondary | ICD-10-CM

## 2021-03-25 DIAGNOSIS — K7689 Other specified diseases of liver: Secondary | ICD-10-CM | POA: Diagnosis not present

## 2021-03-25 MED ORDER — GADOBENATE DIMEGLUMINE 529 MG/ML IV SOLN
20.0000 mL | Freq: Once | INTRAVENOUS | Status: AC | PRN
  Administered 2021-03-25: 20 mL via INTRAVENOUS

## 2021-03-30 DIAGNOSIS — D49512 Neoplasm of unspecified behavior of left kidney: Secondary | ICD-10-CM | POA: Diagnosis not present

## 2021-05-15 DIAGNOSIS — I10 Essential (primary) hypertension: Secondary | ICD-10-CM | POA: Diagnosis not present

## 2021-05-15 DIAGNOSIS — B356 Tinea cruris: Secondary | ICD-10-CM | POA: Diagnosis not present

## 2021-05-21 DIAGNOSIS — K5120 Ulcerative (chronic) proctitis without complications: Secondary | ICD-10-CM | POA: Diagnosis not present

## 2021-05-21 DIAGNOSIS — K5730 Diverticulosis of large intestine without perforation or abscess without bleeding: Secondary | ICD-10-CM | POA: Diagnosis not present

## 2021-05-21 DIAGNOSIS — K635 Polyp of colon: Secondary | ICD-10-CM | POA: Diagnosis not present

## 2021-05-21 DIAGNOSIS — K648 Other hemorrhoids: Secondary | ICD-10-CM | POA: Diagnosis not present

## 2021-05-23 DIAGNOSIS — K635 Polyp of colon: Secondary | ICD-10-CM | POA: Diagnosis not present

## 2021-05-23 DIAGNOSIS — K5120 Ulcerative (chronic) proctitis without complications: Secondary | ICD-10-CM | POA: Diagnosis not present

## 2021-06-23 IMAGING — MR MR ABDOMEN WO/W CM
12 of 17 series · 27 of 48 positions shown · IV contrast (multihance)
Comparison: MRI of the abdomen from Saturday June, 2020.

CLINICAL DATA: Post LEFT nephrectomy in June 2020. Follow-up
evaluation.

EXAM:
MRI ABDOMEN WITHOUT AND WITH CONTRAST
TECHNIQUE: Multiplanar multisequence MR imaging of the abdomen was performed
both before and after the administration of intravenous contrast.
CONTRAST:  20mL MULTIHANCE GADOBENATE DIMEGLUMINE 529 MG/ML IV SOLN

[Series 3: T2 · coronal · 5.0mm · 1.56mm/px · 1 of 25 slices shown (1 of 3)]
[im 1/25]
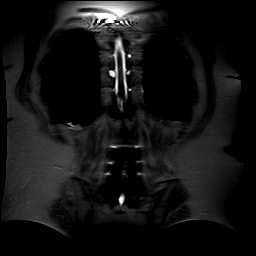

[Series 4: axial tru fisp · axial · 5.0mm · 1.48mm/px · 1 of 32 slices shown]
[im 1/32]
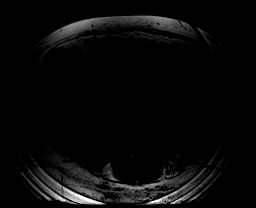

[Series 5: ep2d_diff_b50_500_800_p2 · axial · 6.0mm · 1.98mm/px · z∈[-82,+145]mm · 3 of 90 slices shown]
[im 1/90]
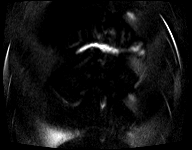
[im 45/90]
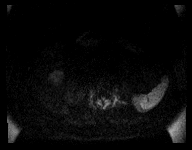
[im 90/90]
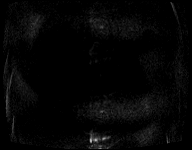

[Series 6: ep2d_diff_b50_500_800_p2_adc · axial · 6.0mm · 1.98mm/px · 1 of 30 slices shown]
[im 1/30]
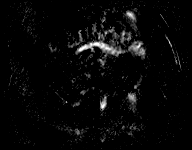

[Series 7: T2 · axial · 6.5mm · 0.74mm/px · 1 of 30 slices shown (2 of 3)]
[im 1/30]
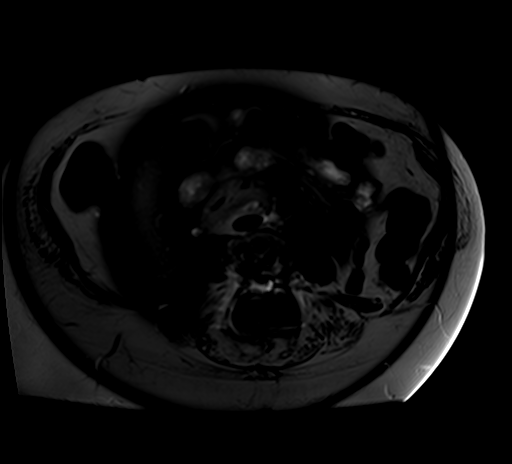

[Series 8: T2 · axial · 5.0mm · 1.45mm/px · 1 of 36 slices shown (3 of 3)]
[im 1/36]
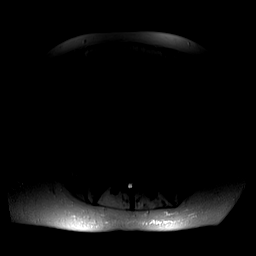

[Series 9: axial in out · axial · 5.5mm · 0.74mm/px · z∈[-100,+108]mm · 2 of 68 slices shown]
[im 1/68]
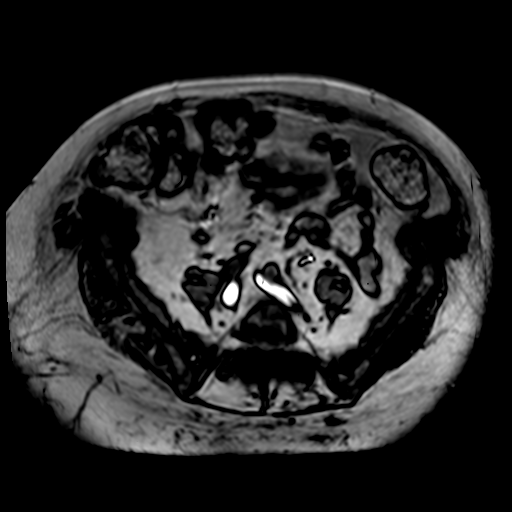
[im 68/68]
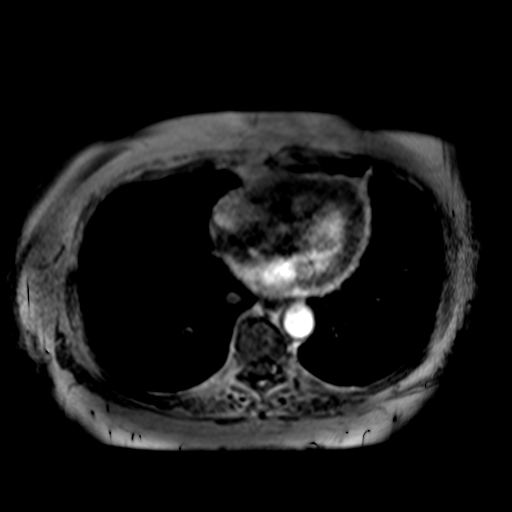

[Series 10: T1 dynamic · axial · non-contrast · 2.6mm · 0.78mm/px · z∈[-92,+113]mm · 3 of 80 slices shown]
[im 1/80]
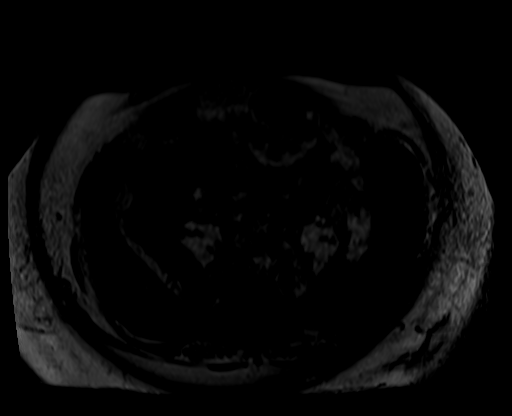
[im 40/80]
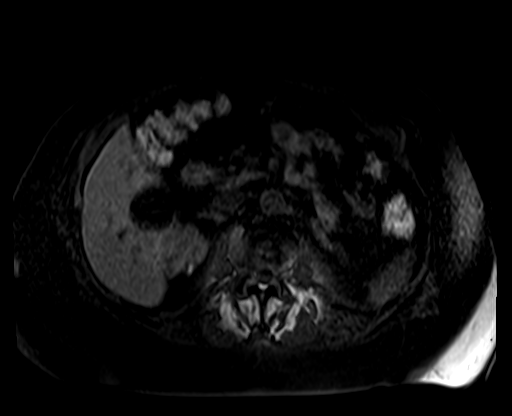
[im 80/80]
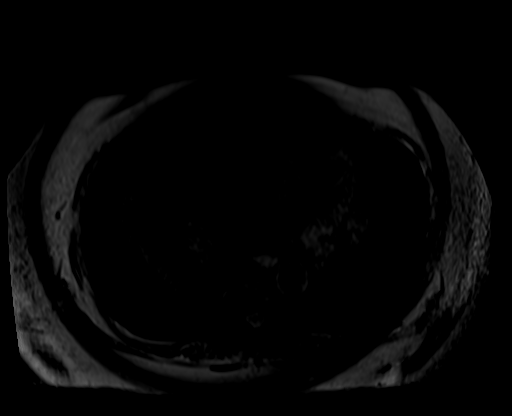

[Series 11: post 25 sec · axial · 2.6mm · 0.78mm/px · z∈[-92,+113]mm · 4 of 80 slices shown]
[im 1/80]
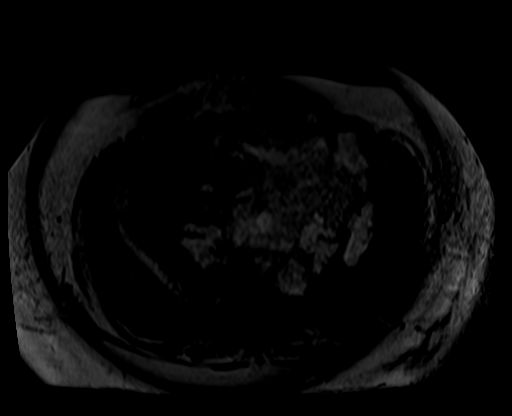
[im 27/80]
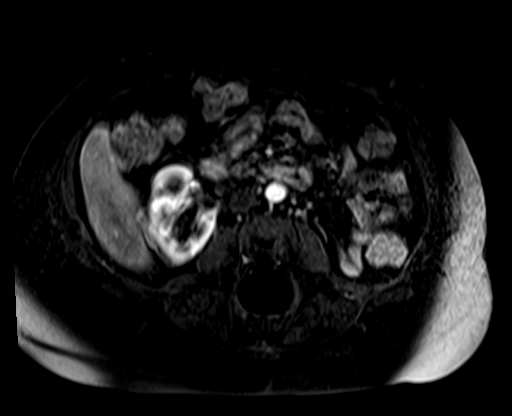
[im 53/80]
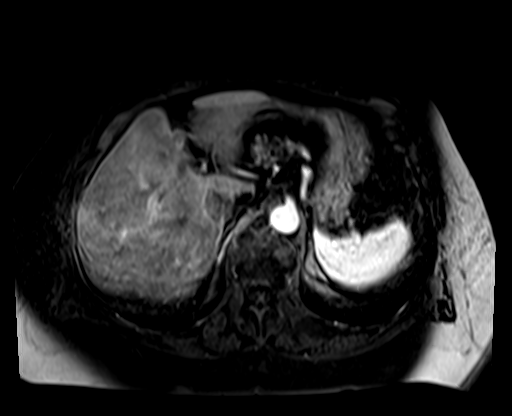
[im 80/80]
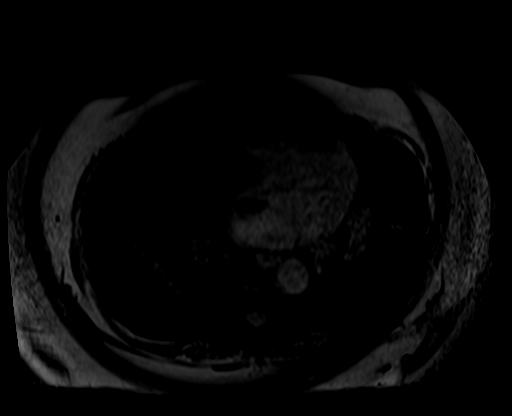

[Series 12: post 25 sec_sub · axial · 2.6mm · 0.78mm/px · z∈[-92,+113]mm · 4 of 80 slices shown]
[im 1/80]
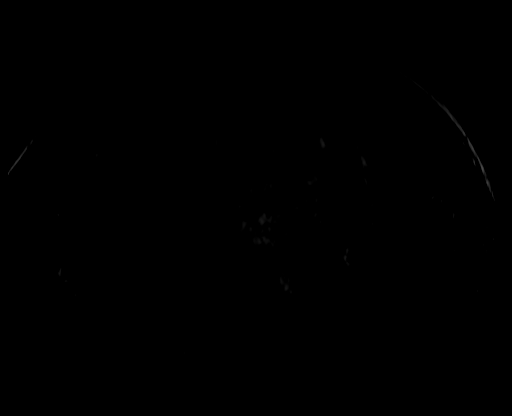
[im 27/80]
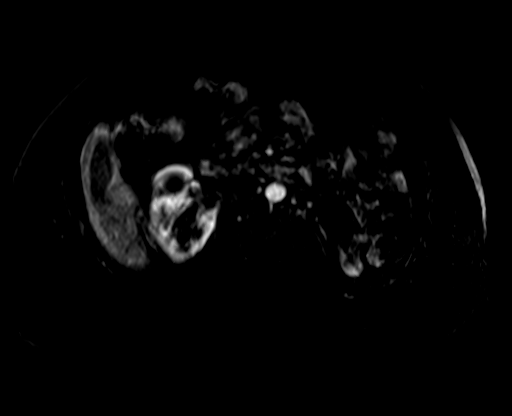
[im 53/80]
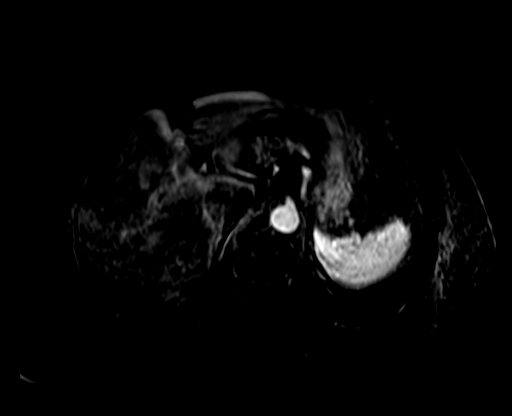
[im 80/80]
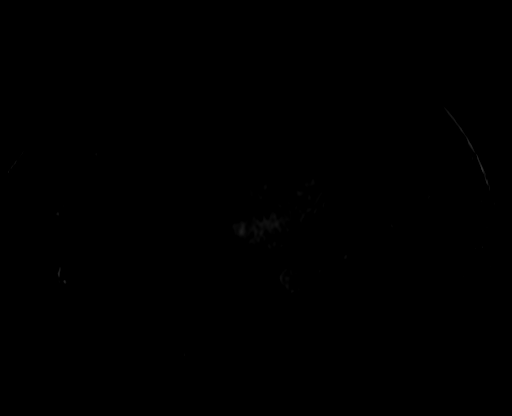

[Series 13: post 45 sec · axial · 2.6mm · 0.78mm/px · z∈[-92,+113]mm · 4 of 80 slices shown]
[im 1/80]
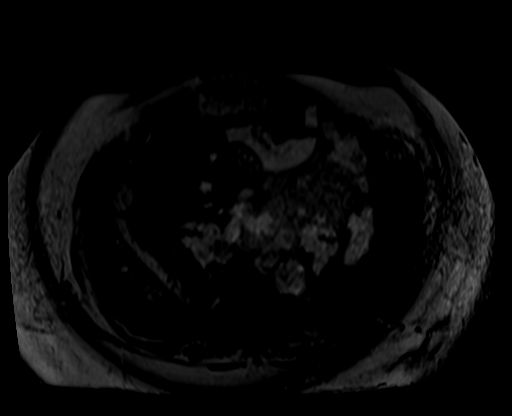
[im 27/80]
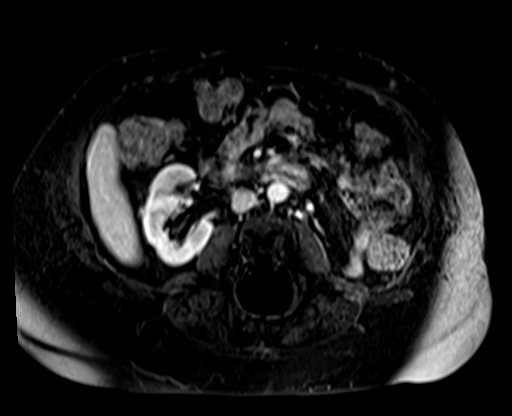
[im 53/80]
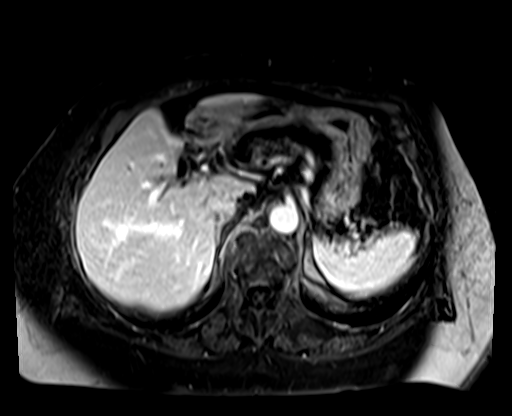
[im 80/80]
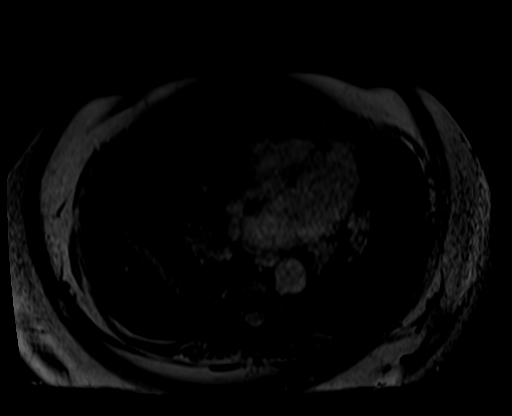

[Series 14: post 45 sec_sub · axial · 2.6mm · 0.78mm/px · z∈[-92,-25]mm · 2 of 80 slices shown]
[im 1/80]
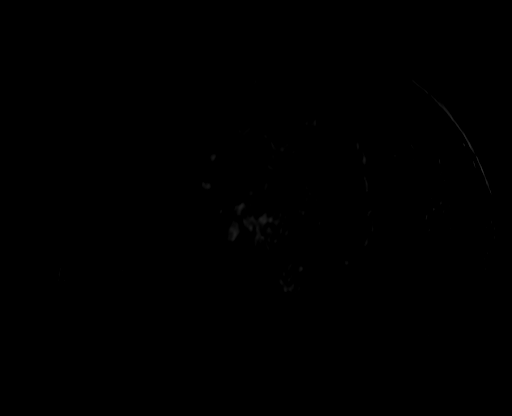
[im 27/80]
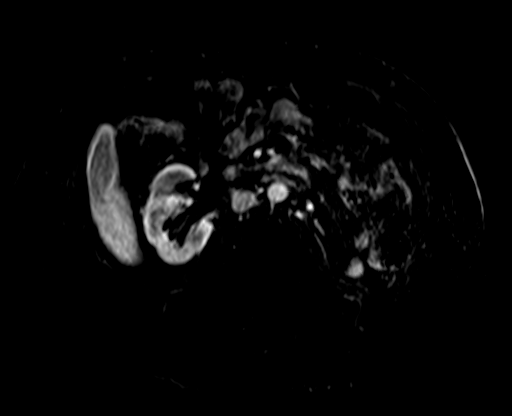

[27 of 48 positions shown; findings below may reference images not displayed]

FINDINGS: Lower chest: Unremarkable with limited assessment on MRI.

Hepatobiliary: Hepatic cysts. No focal, suspicious hepatic lesion.
Post cholecystectomy without substantial biliary duct dilation.
Portal vein is patent. Hepatic veins are patent.

Pancreas: Pancreatic atrophy. 1.6 cm cystic pancreatic lesion on
image 15 of series 8 and another tiny lesion in the tail of the
pancreas show no change in there is no sign of pancreatic ductal
dilation or adjacent inflammation. No internal enhancement of the
cystic area in the body of the pancreas.

Spleen:  Normal spleen.

Adrenals/Urinary Tract: RIGHT adrenal gland is normal. LEFT adrenal
gland is surgically absent. Thickening in the area of the LEFT
adrenal gland and bandlike area of hypointense signal on T2 is
similar to the prior MR evaluation on T2 and shows mild intrinsic T1
hyperintensity without definitive enhancement with the exception of
potential enhancement in the cephalad margin of this area versus
misregistration related artifact on subtraction (image 40/19) signal
intensity of 334 on post-contrast imaging in this area and a signal
intensity of 149 pre contrast would indicate some enhancement. This
area measures 2.2 x 0.6 cm as compared to 3.1 x 0.9 cm on the prior
study. It appears less nodular on the coronal images where it is
more bandlike and contiguous with the operative bed.

RIGHT renal cysts.  No hydronephrosis.  No perinephric stranding.

Stomach/Bowel: No acute gastrointestinal process to the extent
evaluated on abdominal MRI.

Vascular/Lymphatic: No pathologically enlarged lymph nodes
identified. No abdominal aortic aneurysm demonstrated.

Other:  None.

Musculoskeletal: No suspicious bone lesions identified. Signs of
spinal fusion as before
IMPRESSION: Postoperative changes in the LEFT retroperitoneum with more nodular
area in the cephalad margin at the site of the previous LEFT adrenal
gland that may show some mild enhancement. This is however decreased
in size compared to previous imaging and other areas show no
evidence of enhancement. Area appears less masslike on coronal
images. Postoperative changes in evolution are favored, consider
continued close follow-up to ensure continued decrease in size.

Pancreatic cystic lesions largest approximately 1.6 cm, attention on
subsequent imaging is suggested, dedicated MRI/MRCP could also be
considered at 1 year interval.

Medial RIGHT lower lobe nodule not mentioned above but seen on
previous imaging not well evaluated on the current MRI measuring up
to 1 cm.

## 2021-07-04 ENCOUNTER — Other Ambulatory Visit (HOSPITAL_BASED_OUTPATIENT_CLINIC_OR_DEPARTMENT_OTHER): Payer: Self-pay | Admitting: Family Medicine

## 2021-07-04 DIAGNOSIS — M7989 Other specified soft tissue disorders: Secondary | ICD-10-CM | POA: Diagnosis not present

## 2021-07-05 ENCOUNTER — Ambulatory Visit (HOSPITAL_BASED_OUTPATIENT_CLINIC_OR_DEPARTMENT_OTHER)
Admission: RE | Admit: 2021-07-05 | Discharge: 2021-07-05 | Disposition: A | Discharge status: Home / Self Care | Payer: Medicare Other | Source: Ambulatory Visit | Attending: Family Medicine | Admitting: Family Medicine

## 2021-07-05 ENCOUNTER — Other Ambulatory Visit: Payer: Self-pay

## 2021-07-05 DIAGNOSIS — M7989 Other specified soft tissue disorders: Secondary | ICD-10-CM | POA: Insufficient documentation

## 2021-08-28 ENCOUNTER — Other Ambulatory Visit: Payer: Self-pay | Admitting: Urology

## 2021-08-28 DIAGNOSIS — D49512 Neoplasm of unspecified behavior of left kidney: Secondary | ICD-10-CM

## 2021-08-29 DIAGNOSIS — I9589 Other hypotension: Secondary | ICD-10-CM | POA: Diagnosis not present

## 2021-08-29 DIAGNOSIS — R5383 Other fatigue: Secondary | ICD-10-CM | POA: Diagnosis not present

## 2021-08-29 DIAGNOSIS — E7800 Pure hypercholesterolemia, unspecified: Secondary | ICD-10-CM | POA: Diagnosis not present

## 2021-08-29 DIAGNOSIS — Z905 Acquired absence of kidney: Secondary | ICD-10-CM | POA: Diagnosis not present

## 2021-08-29 DIAGNOSIS — I700 Atherosclerosis of aorta: Secondary | ICD-10-CM | POA: Diagnosis not present

## 2021-08-29 DIAGNOSIS — Z23 Encounter for immunization: Secondary | ICD-10-CM | POA: Diagnosis not present

## 2021-08-29 DIAGNOSIS — M810 Age-related osteoporosis without current pathological fracture: Secondary | ICD-10-CM | POA: Diagnosis not present

## 2021-09-18 ENCOUNTER — Ambulatory Visit
Admission: RE | Admit: 2021-09-18 | Discharge: 2021-09-18 | Disposition: A | Discharge status: Home / Self Care | Payer: Medicare Other | Source: Ambulatory Visit | Attending: Urology | Admitting: Urology

## 2021-09-18 DIAGNOSIS — Z85528 Personal history of other malignant neoplasm of kidney: Secondary | ICD-10-CM | POA: Diagnosis not present

## 2021-09-18 DIAGNOSIS — K862 Cyst of pancreas: Secondary | ICD-10-CM | POA: Diagnosis not present

## 2021-09-18 DIAGNOSIS — R918 Other nonspecific abnormal finding of lung field: Secondary | ICD-10-CM | POA: Diagnosis not present

## 2021-09-18 DIAGNOSIS — D49512 Neoplasm of unspecified behavior of left kidney: Secondary | ICD-10-CM

## 2021-09-18 DIAGNOSIS — K769 Liver disease, unspecified: Secondary | ICD-10-CM | POA: Diagnosis not present

## 2021-09-18 MED ORDER — GADOPICLENOL 0.5 MMOL/ML IV SOLN
10.0000 mL | Freq: Once | INTRAVENOUS | Status: AC | PRN
  Administered 2021-09-18: 10 mL via INTRAVENOUS

## 2021-09-26 DIAGNOSIS — D49512 Neoplasm of unspecified behavior of left kidney: Secondary | ICD-10-CM | POA: Diagnosis not present

## 2021-09-26 DIAGNOSIS — D381 Neoplasm of uncertain behavior of trachea, bronchus and lung: Secondary | ICD-10-CM | POA: Diagnosis not present

## 2021-10-02 DIAGNOSIS — D49512 Neoplasm of unspecified behavior of left kidney: Secondary | ICD-10-CM | POA: Diagnosis not present

## 2021-10-02 DIAGNOSIS — R911 Solitary pulmonary nodule: Secondary | ICD-10-CM | POA: Diagnosis not present

## 2021-10-03 ENCOUNTER — Telehealth: Payer: Self-pay

## 2021-10-04 ENCOUNTER — Telehealth: Payer: Self-pay | Admitting: Interventional Cardiology

## 2021-10-04 MED ORDER — NITROGLYCERIN 0.4 MG SL SUBL: 0.4000 mg | SUBLINGUAL_TABLET | SUBLINGUAL | 5 refills | Status: AC | PRN

## 2021-10-04 NOTE — Telephone Encounter (Signed)
Attempted to contact patient, no answer. Left message to callback tomorrow.

## 2021-10-04 NOTE — Telephone Encounter (Signed)
Pt's medication was sent to pt's pharmacy as requested. Confirmation received.  °

## 2021-10-04 NOTE — Telephone Encounter (Signed)
*  STAT* If patient is at the pharmacy, call can be transferred to refill team.   1. Which medications need to be refilled? (please list name of each medication and dose if known)   nitroGLYCERIN (NITROSTAT) 0.4 MG SL tablet    2. Which pharmacy/location (including street and city if local pharmacy) is medication to be sent to? [facility] [place], [address] AT [address]  3. Do they need a 30 day or 90 day supply?  30 day

## 2021-10-05 ENCOUNTER — Telehealth: Payer: Self-pay | Admitting: Interventional Cardiology

## 2021-10-05 NOTE — Telephone Encounter (Signed)
Patient stated she just wanted to make sure she needs to stay on Nitroglycerin PRN. Informed patient with her history that this will probably always be on her medication list. Patient verbalized understanding.

## 2021-10-05 NOTE — Telephone Encounter (Signed)
Follow Up:      Patient is returning [name]'s call from yesterday.

## 2021-12-12 DIAGNOSIS — Z79899 Other long term (current) drug therapy: Secondary | ICD-10-CM | POA: Diagnosis not present

## 2021-12-12 DIAGNOSIS — M25552 Pain in left hip: Secondary | ICD-10-CM | POA: Diagnosis not present

## 2021-12-12 DIAGNOSIS — R350 Frequency of micturition: Secondary | ICD-10-CM | POA: Diagnosis not present

## 2021-12-12 DIAGNOSIS — M25571 Pain in right ankle and joints of right foot: Secondary | ICD-10-CM | POA: Diagnosis not present

## 2021-12-13 ENCOUNTER — Ambulatory Visit
Admission: RE | Admit: 2021-12-13 | Discharge: 2021-12-13 | Disposition: A | Discharge status: Home / Self Care | Payer: Medicare Other | Source: Ambulatory Visit | Attending: Family Medicine | Admitting: Family Medicine

## 2021-12-13 ENCOUNTER — Other Ambulatory Visit: Payer: Self-pay | Admitting: Family Medicine

## 2021-12-13 DIAGNOSIS — R52 Pain, unspecified: Secondary | ICD-10-CM

## 2021-12-13 DIAGNOSIS — M25571 Pain in right ankle and joints of right foot: Secondary | ICD-10-CM | POA: Diagnosis not present

## 2021-12-13 DIAGNOSIS — M25552 Pain in left hip: Secondary | ICD-10-CM | POA: Diagnosis not present

## 2021-12-14 ENCOUNTER — Other Ambulatory Visit: Payer: Self-pay | Admitting: *Deleted

## 2021-12-14 DIAGNOSIS — R918 Other nonspecific abnormal finding of lung field: Secondary | ICD-10-CM

## 2021-12-27 DIAGNOSIS — M25571 Pain in right ankle and joints of right foot: Secondary | ICD-10-CM | POA: Diagnosis not present

## 2021-12-27 DIAGNOSIS — M1612 Unilateral primary osteoarthritis, left hip: Secondary | ICD-10-CM | POA: Diagnosis not present

## 2022-01-02 DIAGNOSIS — L03116 Cellulitis of left lower limb: Secondary | ICD-10-CM | POA: Diagnosis not present

## 2022-01-02 DIAGNOSIS — S81802A Unspecified open wound, left lower leg, initial encounter: Secondary | ICD-10-CM | POA: Diagnosis not present

## 2022-01-07 DIAGNOSIS — S81802D Unspecified open wound, left lower leg, subsequent encounter: Secondary | ICD-10-CM | POA: Diagnosis not present

## 2022-01-07 DIAGNOSIS — L03116 Cellulitis of left lower limb: Secondary | ICD-10-CM | POA: Diagnosis not present

## 2022-01-08 NOTE — Progress Notes (Signed)
Cardiology Office Note:    Date:  01/08/2022   ID:  Pavielle, Biggar [birth year], MRN [id number]  PCP:  Glenis Smoker, MD  Cardiologist:  Sinclair Grooms, MD   Referring MD: Glenis Smoker, *   No chief complaint on file.   History of Present Illness:    Isabella [name] is a 81 y.o. female with a hx of CAD (with scad) s/p STEMI in 2012 treated with DES to the RCA x2 proximal for catheter induced dissection distal to treated acute lesion, ulcerative colitis, HTN, HLD, chronic diastolic CHF, chronic LE swelling with DVT (not on AC) and pulmonary sarcoidosis with biopsy.  [name] is doing well.  Her cardiac complaints or nail.  Past Medical History:  Diagnosis Date   Arthritis    Complication of anesthesia    Took a while to wake up with knee. Avoid pancuronium   Coronary artery disease    MI 2012, 2019, Dr. 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 [name]   EYE SURGERY  [phone]   corneal transplant   FEMORAL  ARTERY EXPLORATION  03/25/2011   Procedure: FEMORAL ARTERY EXPLORATION;  Surgeon: Elam Dutch, MD;  Location: [place];  Service: Vascular;  Laterality: Right;  Evacuation of Hematoma    hematoma surgery     from cardiac cath -hematoma in groin-right   HERNIA REPAIR  3536   umbilical   JOINT REPLACEMENT     KNEE ARTHROSCOPY  2006   Right   Laser Surgery Left Eye  2012   LEFT HEART CATH AND CORONARY ANGIOGRAPHY N/A 01/27/2018   Procedure: LEFT HEART CATH AND CORONARY ANGIOGRAPHY;  Surgeon: Belva Crome, MD;  Location: [place] [facility];  Service: Cardiovascular;  Laterality: N/A;   LEFT HEART CATHETERIZATION WITH CORONARY ANGIOGRAM N/A 03/24/2011   Procedure: LEFT HEART CATHETERIZATION WITH CORONARY ANGIOGRAM;  Surgeon: Sinclair Grooms, MD;  Location: [facility] CATH LAB;  Service: Cardiovascular;  Laterality: N/A;   NEPHRECTOMY Left 04/24/2020   Procedure: OPEN RADICAL NEPHRECTOMY WITH RESECTION OF PERITONEAL MASS;  Surgeon: Raynelle Bring, MD;  Location: [facility];  Service: Urology;  Laterality: Left;   PERCUTANEOUS CORONARY STENT INTERVENTION (PCI-S) N/A 03/24/2011   Procedure:  PERCUTANEOUS CORONARY STENT INTERVENTION (PCI-S);  Surgeon: Sinclair Grooms, MD;  Location: [facility] CATH LAB;  Service: Cardiovascular;  Laterality: N/A;   SHOULDER SURGERY  2010   TONSILLECTOMY  1949   TONSILLECTOMY AND ADENOIDECTOMY     TOTAL KNEE ARTHROPLASTY  07/2010   Left   TOTAL KNEE ARTHROPLASTY  06/17/2012   Procedure: TOTAL KNEE ARTHROPLASTY;  Surgeon: Gearlean Alf, MD;  Location: [facility];  Service: Orthopedics;  Laterality: Right;   TUBAL LIGATION  9833   UMBILICAL HERNIA REPAIR  1990, 2007   x 2   VIDEO BRONCHOSCOPY WITH ENDOBRONCHIAL NAVIGATION  04/13/2012   Procedure: VIDEO BRONCHOSCOPY WITH ENDOBRONCHIAL NAVIGATION;  Surgeon: Melrose Nakayama, MD;  Location: [place];  Service: Thoracic;  Laterality: N/A;   VIDEO BRONCHOSCOPY WITH ENDOBRONCHIAL NAVIGATION N/A 04/08/2016   Procedure: VIDEO BRONCHOSCOPY  WITH ENDOBRONCHIAL NAVIGATION;  Surgeon: Rigoberto Noel, MD;  Location: [facility];  Service: Thoracic;  Laterality: N/A;    Current Medications: No outpatient medications have been marked as taking for the 01/09/22 encounter (Appointment) with Belva Crome, MD.     Allergies:   Codeine, Imodium a-d [loperamide hcl], Neomycin, Penicillinase, Penicillins, Tramadol, Lisinopril, Hornet venom, Adhesive [tape], Erythromycin, Hydromorphone, and Percocet [oxycodone-acetaminophen]   Social History   Socioeconomic History   Marital status: Widowed    Spouse name: Not on file   Number of children: Not on file   Years of education: Not on file   Highest education level: Not on file  Occupational History   Occupation: Retired    Fish farm manager: RETIRED  Tobacco Use   Smoking status: Never   Smokeless tobacco: Never  Vaping Use   Vaping Use: Never used  Substance and Sexual Activity   Alcohol use: No   Drug use: No   Sexual activity: Not Currently    Birth control/protection: Post-menopausal, Surgical    Comment: Hysterectomy  Other Topics Concern   Not on file  Social History Narrative   Tobacco use cigarettes: never smoked, tobacco history last updated 12/23/2013. No smoking. No alcohol. Caffeine: yes, coffee, 3 servings daily.recreational drug use: never. No diet. Exercise: walks everyday. Occupation: retired. Marital status: married. Children: boys, 2 girls, 2. Seat belt use: yes.   Social Determinants of Health   Financial Resource Strain: Not on file  Food Insecurity: Not on file  Transportation Needs: Not on file  Physical Activity: Not on file  Stress: Not on file  Social Connections: Not on file     Family History: The patient's family history includes Breast cancer in her maternal grandmother; Cancer in her maternal uncle, maternal uncle, paternal aunt, and paternal uncle; Hypertension in her mother; Pancreatic cancer in her father; Prostate cancer in her maternal uncle.  ROS:    Please see the history of present illness.    Injured her left lower extremity with a laceration related to a car door injury.  It is healing by secondary intent.  Both lower extremities have significant edema.  All other systems reviewed and are negative.  EKGs/Labs/Other Studies Reviewed:    The following studies were reviewed today: No new data.  No recent lipid panel  EKG:  EKG normal sinus rhythm, biatrial abnormality, left axis deviation.  Paired to the prior study, the PR interval is now somewhat shorter.  Recent Labs: No results found for requested labs within last 365 days.  Recent Lipid Panel    Component Value Date/Time   CHOL 113 01/27/2018 0806   TRIG 35  01/27/2018 0806   HDL 60 01/27/2018 0806   CHOLHDL 1.9 01/27/2018 0806   VLDL 7 01/27/2018 0806   LDLCALC 46 01/27/2018 0806    Physical Exam:    VS:  There were no vitals taken for this visit.    Wt Readings from Last 3 Encounters:  01/16/21 202 lb (91.6 kg)  01/11/21 200 lb 6.4 oz (90.9 kg)  06/27/20 212 lb (96.2 kg)     GEN: Obese. No acute distress HEENT: Normal NECK: No JVD. LYMPHATICS: No lymphadenopathy CARDIAC: No murmur. RRR no gallop, 3+ bilateral ankle to shin edema.  Healing L-shaped laceration lateral left calf.  Mildly tender VASCULAR:  Normal Pulses. No bruits. RESPIRATORY:  Clear to auscultation without rales, wheezing or rhonchi  ABDOMEN: Soft, non-tender, non-distended, No pulsatile mass, MUSCULOSKELETAL: No deformity  SKIN: Warm and dry NEUROLOGIC:  Alert and oriented x 3 PSYCHIATRIC:  Normal affect   ASSESSMENT:    1. Chronic diastolic CHF (congestive heart failure) ([place])   2. Atherosclerosis of native coronary artery of native heart without angina pectoris   3. Essential hypertension   4. Hyperlipidemia with target LDL less than 70    PLAN:    In order of problems listed above:  BNP; continue furosemide; consider SGLT2 therapy especially if BNP is elevated. Check lipid  panel. Excellent blood pressure control on current therapy. LDL target less than 70.  Lipid panel will be obtained today.  She may have diastolic heart failure.  Adding an SGLT2 may be helpful.  Laboratory data will be obtained today including a potassium, creatinine, liver panel, lipid panel, and BNP.  Clinical follow-up will be in 6 to 12 months.  She will have a new cardiology provider on return.   Medication Adjustments/Labs and Tests Ordered: Current medicines are reviewed at length with the patient today.  Concerns regarding medicines are outlined above.  No orders of the defined types were placed in this encounter.  No orders of the defined types were placed in this encounter.   There are no Patient Instructions on file for this visit.   Signed, Sinclair Grooms, MD  01/08/2022 3:59 PM    [facility]

## 2022-01-09 ENCOUNTER — Ambulatory Visit: Payer: Medicare Other | Attending: Interventional Cardiology | Admitting: Interventional Cardiology

## 2022-01-09 ENCOUNTER — Encounter: Payer: Self-pay | Admitting: Interventional Cardiology

## 2022-01-09 VITALS — BP 136/86 | HR 72 | Ht 62.0 in | Wt 212.0 lb

## 2022-01-09 DIAGNOSIS — I2510 Atherosclerotic heart disease of native coronary artery without angina pectoris: Secondary | ICD-10-CM | POA: Diagnosis not present

## 2022-01-09 DIAGNOSIS — E785 Hyperlipidemia, unspecified: Secondary | ICD-10-CM | POA: Diagnosis not present

## 2022-01-09 DIAGNOSIS — I10 Essential (primary) hypertension: Secondary | ICD-10-CM | POA: Diagnosis not present

## 2022-01-09 DIAGNOSIS — I5032 Chronic diastolic (congestive) heart failure: Secondary | ICD-10-CM

## 2022-01-09 NOTE — Patient Instructions (Signed)
Medication Instructions:  Your physician recommends that you continue on your current medications as directed. Please refer to the Current Medication list given to you today.  *If you need a refill on your cardiac medications before your next appointment, please call your pharmacy*  Lab Work: TODAY: CMET, Lipid, BNP If you have labs (blood work) drawn today and your tests are completely normal, you will receive your results only by: [place] (if you have MyChart) OR A paper copy in the mail If you have any lab test that is abnormal or we need to change your treatment, we will call you to review the results.  Testing/Procedures: NONE  Follow-Up: At [facility], you and your health needs are our priority.  As part of our continuing mission to provide you with exceptional heart care, we have created designated Provider Care Teams.  These Care Teams include your primary Cardiologist (physician) and Advanced Practice Providers (APPs -  Physician Assistants and Nurse Practitioners) who all work together to provide you with the care you need, when you need it.  Your next appointment:   1 year(s)  The format for your next appointment:   In Person  Provider:   Sinclair Grooms, MD   Important Information About [place]

## 2022-01-10 DIAGNOSIS — I2510 Atherosclerotic heart disease of native coronary artery without angina pectoris: Secondary | ICD-10-CM | POA: Diagnosis not present

## 2022-01-10 DIAGNOSIS — I10 Essential (primary) hypertension: Secondary | ICD-10-CM | POA: Diagnosis not present

## 2022-01-10 DIAGNOSIS — E7800 Pure hypercholesterolemia, unspecified: Secondary | ICD-10-CM | POA: Diagnosis not present

## 2022-01-10 LAB — COMPREHENSIVE METABOLIC PANEL
ALT: 14 IU/L (ref 0–32)
AST: 25 IU/L (ref 0–40)
Albumin/Globulin Ratio: 1.8 (ref 1.2–2.2)
Albumin: 4.3 g/dL (ref 3.8–4.8)
Alkaline Phosphatase: 133 IU/L — ABNORMAL HIGH (ref 44–121)
BUN/Creatinine Ratio: 14 (ref 12–28)
BUN: 18 mg/dL (ref 8–27)
Bilirubin Total: 0.4 mg/dL (ref 0.0–1.2)
CO2: 26 mmol/L (ref 20–29)
Calcium: 9.5 mg/dL (ref 8.7–10.3)
Chloride: 100 mmol/L (ref 96–106)
Creatinine, Ser: 1.32 mg/dL — ABNORMAL HIGH (ref 0.57–1.00)
Globulin, Total: 2.4 g/dL (ref 1.5–4.5)
Glucose: 95 mg/dL (ref 70–99)
Potassium: 4.6 mmol/L (ref 3.5–5.2)
Sodium: 142 mmol/L (ref 134–144)
Total Protein: 6.7 g/dL (ref 6.0–8.5)
eGFR: 41 mL/min/{1.73_m2} — ABNORMAL LOW (ref >59)

## 2022-01-10 LAB — LIPID PANEL
Chol/HDL Ratio: 2.3 ratio (ref 0.0–4.4)
Cholesterol, Total: 164 mg/dL (ref 100–199)
HDL: 71 mg/dL (ref >39)
LDL Chol Calc (NIH): 76 mg/dL (ref 0–99)
Triglycerides: 93 mg/dL (ref 0–149)
VLDL Cholesterol Cal: 17 mg/dL (ref 5–40)

## 2022-01-10 LAB — PRO B NATRIURETIC PEPTIDE: NT-Pro BNP: 458 pg/mL (ref 0–738)

## 2022-01-11 DIAGNOSIS — Z23 Encounter for immunization: Secondary | ICD-10-CM | POA: Diagnosis not present

## 2022-01-11 DIAGNOSIS — L03116 Cellulitis of left lower limb: Secondary | ICD-10-CM | POA: Diagnosis not present

## 2022-01-15 DIAGNOSIS — L03116 Cellulitis of left lower limb: Secondary | ICD-10-CM | POA: Diagnosis not present

## 2022-01-16 ENCOUNTER — Ambulatory Visit
Admission: RE | Admit: 2022-01-16 | Discharge: 2022-01-16 | Disposition: A | Discharge status: Home / Self Care | Payer: Medicare Other | Source: Ambulatory Visit | Attending: Thoracic Surgery (Cardiothoracic Vascular Surgery) | Admitting: Thoracic Surgery (Cardiothoracic Vascular Surgery)

## 2022-01-16 DIAGNOSIS — R918 Other nonspecific abnormal finding of lung field: Secondary | ICD-10-CM

## 2022-01-16 DIAGNOSIS — N390 Urinary tract infection, site not specified: Secondary | ICD-10-CM | POA: Diagnosis not present

## 2022-01-16 DIAGNOSIS — L03116 Cellulitis of left lower limb: Secondary | ICD-10-CM | POA: Diagnosis not present

## 2022-01-16 DIAGNOSIS — R911 Solitary pulmonary nodule: Secondary | ICD-10-CM | POA: Diagnosis not present

## 2022-01-21 ENCOUNTER — Other Ambulatory Visit: Payer: Self-pay | Admitting: Family Medicine

## 2022-01-21 DIAGNOSIS — N1831 Chronic kidney disease, stage 3a: Secondary | ICD-10-CM

## 2022-01-21 DIAGNOSIS — Z905 Acquired absence of kidney: Secondary | ICD-10-CM

## 2022-01-22 ENCOUNTER — Ambulatory Visit: Payer: Medicare Other | Admitting: Thoracic Surgery (Cardiothoracic Vascular Surgery)

## 2022-01-22 VITALS — BP 172/101 | HR 69 | Ht 62.0 in | Wt 212.0 lb

## 2022-01-22 DIAGNOSIS — R918 Other nonspecific abnormal finding of lung field: Secondary | ICD-10-CM | POA: Diagnosis not present

## 2022-01-22 NOTE — Progress Notes (Signed)
[address]       [place] [address]             [phone]    HPI: Isabella [name] returns for follow-up of lung nodules  Isabella [name] is a 81 year old woman with a history of multiple waxing and waning lung nodules, sarcoidosis, ulcerative colitis, CAD, reflux, arthritis, hypertension, lymphedema, and a liposarcoma of the retroperitoneum.  She has had lung nodules for over 10 years now.  In 2013 a CT-guided biopsy of the left lower lobe lung mass showed noncaseating granulomas consistent with sarcoidosis.  That nodule ultimately resolved.  She had a PET/CT in 2018 that showed a nodule with activity.  CT-guided biopsy showed granulomatous inflammation but no evidence of malignancy.  I last saw her in 2022.  She had multiple nodules that were stable.  In the interim since her last visit she has not had any issues from a pulmonary standpoint.  She has had some hematuria and is getting an ultrasound of the kidney later this week.  Past Medical History:  Diagnosis Date   Arthritis    Complication of anesthesia    Took a while to wake up with knee. Avoid pancuronium   Coronary artery disease    MI 2012, 2019, Dr. 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     aspirin EC 81 MG tablet Take 81 mg by mouth daily at 2 PM.      atorvastatin (LIPITOR) 80 MG tablet Take 1 tablet (80 mg total) by mouth daily. 90 tablet 3   chlorpheniramine (CHLOR-TRIMETON) 4 MG tablet Take 8 mg by mouth at bedtime.      Cholecalciferol (D3 PO) Take 2,000 mg by mouth.     furosemide (LASIX) 40 MG tablet Take 40 mg by mouth daily.     mesalamine (APRISO) 0.375 g 24 hr capsule Take 1,500 mg by mouth daily.     metoprolol tartrate (LOPRESSOR) 12.5 mg TABS tablet Take 12.5 mg by mouth 2 (two) times daily.     Multiple Vitamins-Minerals (MULTI FOR HER 50+) TABS 1 tablet     nitroGLYCERIN (NITROSTAT) 0.4 MG SL tablet Place 1 tablet (0.4 mg total) under the tongue every 5 (five) minutes as needed for chest pain. 25 tablet 5   potassium chloride SA (K-DUR,KLOR-CON) 20 MEQ tablet Take 20 mEq by mouth daily with supper.      UNABLE TO FIND CPAP: At bedtime nightly and during all naps     No  current facility-administered medications for this visit.    Physical Exam BP (!) 172/101 (BP Location: Left Arm, Patient Position: Sitting)   Pulse 69   Ht 5' 2"  (1.575 m)   Wt 212 lb (96.2 kg)   SpO2 96% Comment: RA  BMI 38.63 kg/m  81 year old woman in no acute distress Alert and oriented x3 with no focal deficits Lungs clear bilaterally Cardiac regular rate and rhythm  Diagnostic Tests: CT CHEST WITHOUT CONTRAST   TECHNIQUE: Multidetector CT imaging of the chest was performed following the standard protocol without IV contrast.   RADIATION DOSE REDUCTION: This exam was performed according to the departmental dose-optimization program which includes automated exposure control, adjustment of the mA and/or kV according to patient size and/or use of iterative reconstruction technique.   COMPARISON:  Chest CT 01/12/2021 and 12/09/2019.   FINDINGS: Cardiovascular: Mild atherosclerosis of the aorta, great vessels and coronary arteries. There is a stent  within the right coronary artery. The heart size is normal. There is no pericardial effusion.   Mediastinum/Nodes: There are no enlarged mediastinal, hilar or axillary lymph nodes.Unchanged low-density right thyroid nodule (previously biopsied) measuring 2.0 cm on image 13/2. Mild distal esophageal wall thickening.   Lungs/Pleura: No pleural effusion or pneumothorax. Numerous pulmonary nodules are again noted bilaterally which are similar to previous studies. The largest nodules are in the right lower lobe, measuring 1.4 x 1.1 cm on image 71/8 and 1.4 x 1.4 cm on image 91/8, and in the left lower lobe measuring 1.9 x 1.1 cm on image 90/8. No new or enlarging nodules are identified.   Upper abdomen: The visualized upper abdomen appears stable without significant findings. Hepatic and pancreatic cystic lesions are grossly unchanged. There are postsurgical changes in the left retroperitoneum which appear stable. Previous ventral hernia repair.   Musculoskeletal/Chest wall: There is no chest wall mass or suspicious osseous finding. Multilevel spondylosis.   IMPRESSION: 1. Stable bilateral pulmonary nodules. No new or enlarging nodules demonstrated to suggest metastatic disease. Findings may relate to remote history of sarcoidosis. 2. No adenopathy or acute thoracic findings. 3. Grossly stable appearance of the visualized upper abdomen.     Electronically Signed   By: Richardean Sale M.D.   On: 01/16/2022 11:43 I personally reviewed the CT images.  There is been no interval change in her multiple lung nodules and no new nodules are present.  Impression: Isabella [name] is a 81 year old woman with a history of multiple waxing and waning lung nodules, sarcoidosis, ulcerative colitis, CAD, reflux, arthritis, hypertension, lymphedema, and a liposarcoma of the retroperitoneum.  She has had lung nodules for over 10 years now.  In 2013 a CT-guided biopsy of the left lower lobe lung mass showed  noncaseating granulomas consistent with sarcoidosis.  That nodule ultimately resolved.  She had a PET/CT in 2018 that showed a nodule with activity.  CT-guided biopsy showed granulomatous inflammation but no evidence of malignancy.  Lung nodules-has not been stable for the past 2 years.  She has had 2 biopsies previously.  But showed granulomatous disease.  She is a non-smoker.  There is no reason to suspect that these are malignant.  Given the 2 years of stability no further follow-up is necessary.  Plan: I will be happy to see Isabella [name] back anytime in the future if I can be of any further assistance with her care  Melrose Nakayama, MD [facility] [phone]

## 2022-01-23 ENCOUNTER — Ambulatory Visit
Admission: RE | Admit: 2022-01-23 | Discharge: 2022-01-23 | Disposition: A | Discharge status: Home / Self Care | Payer: Medicare Other | Source: Ambulatory Visit | Attending: Family Medicine | Admitting: Family Medicine

## 2022-01-23 DIAGNOSIS — Z905 Acquired absence of kidney: Secondary | ICD-10-CM

## 2022-01-23 DIAGNOSIS — N281 Cyst of kidney, acquired: Secondary | ICD-10-CM | POA: Diagnosis not present

## 2022-01-23 DIAGNOSIS — N1831 Chronic kidney disease, stage 3a: Secondary | ICD-10-CM

## 2022-01-23 DIAGNOSIS — N189 Chronic kidney disease, unspecified: Secondary | ICD-10-CM | POA: Diagnosis not present

## 2022-02-15 DIAGNOSIS — G4733 Obstructive sleep apnea (adult) (pediatric): Secondary | ICD-10-CM | POA: Diagnosis not present

## 2022-02-21 DIAGNOSIS — D49512 Neoplasm of unspecified behavior of left kidney: Secondary | ICD-10-CM | POA: Diagnosis not present

## 2022-02-21 DIAGNOSIS — K7689 Other specified diseases of liver: Secondary | ICD-10-CM | POA: Diagnosis not present

## 2022-02-21 DIAGNOSIS — K439 Ventral hernia without obstruction or gangrene: Secondary | ICD-10-CM | POA: Diagnosis not present

## 2022-02-21 DIAGNOSIS — R310 Gross hematuria: Secondary | ICD-10-CM | POA: Diagnosis not present

## 2022-02-21 DIAGNOSIS — N281 Cyst of kidney, acquired: Secondary | ICD-10-CM | POA: Diagnosis not present

## 2022-02-21 DIAGNOSIS — K5730 Diverticulosis of large intestine without perforation or abscess without bleeding: Secondary | ICD-10-CM | POA: Diagnosis not present

## 2022-03-08 ENCOUNTER — Emergency Department (HOSPITAL_COMMUNITY): Payer: Medicare Other

## 2022-03-08 ENCOUNTER — Emergency Department (HOSPITAL_COMMUNITY)
Admission: EM | Admit: 2022-03-08 | Discharge: 2022-03-08 | Disposition: A | Discharge status: Home / Self Care | Payer: Medicare Other | Attending: Emergency Medicine | Admitting: Emergency Medicine

## 2022-03-08 ENCOUNTER — Other Ambulatory Visit: Payer: Self-pay

## 2022-03-08 ENCOUNTER — Encounter (HOSPITAL_COMMUNITY): Payer: Self-pay | Admitting: Emergency Medicine

## 2022-03-08 DIAGNOSIS — R202 Paresthesia of skin: Secondary | ICD-10-CM | POA: Insufficient documentation

## 2022-03-08 DIAGNOSIS — I771 Stricture of artery: Secondary | ICD-10-CM | POA: Insufficient documentation

## 2022-03-08 DIAGNOSIS — I2510 Atherosclerotic heart disease of native coronary artery without angina pectoris: Secondary | ICD-10-CM | POA: Insufficient documentation

## 2022-03-08 DIAGNOSIS — R001 Bradycardia, unspecified: Secondary | ICD-10-CM | POA: Diagnosis not present

## 2022-03-08 DIAGNOSIS — Z79899 Other long term (current) drug therapy: Secondary | ICD-10-CM | POA: Diagnosis not present

## 2022-03-08 DIAGNOSIS — I6523 Occlusion and stenosis of bilateral carotid arteries: Secondary | ICD-10-CM | POA: Diagnosis not present

## 2022-03-08 DIAGNOSIS — R7989 Other specified abnormal findings of blood chemistry: Secondary | ICD-10-CM | POA: Diagnosis not present

## 2022-03-08 DIAGNOSIS — R220 Localized swelling, mass and lump, head: Secondary | ICD-10-CM | POA: Diagnosis not present

## 2022-03-08 DIAGNOSIS — Z7982 Long term (current) use of aspirin: Secondary | ICD-10-CM | POA: Insufficient documentation

## 2022-03-08 DIAGNOSIS — M4812 Ankylosing hyperostosis [Forestier], cervical region: Secondary | ICD-10-CM | POA: Diagnosis not present

## 2022-03-08 DIAGNOSIS — M542 Cervicalgia: Secondary | ICD-10-CM | POA: Insufficient documentation

## 2022-03-08 DIAGNOSIS — Z955 Presence of coronary angioplasty implant and graft: Secondary | ICD-10-CM | POA: Diagnosis not present

## 2022-03-08 DIAGNOSIS — M47812 Spondylosis without myelopathy or radiculopathy, cervical region: Secondary | ICD-10-CM | POA: Diagnosis not present

## 2022-03-08 DIAGNOSIS — I10 Essential (primary) hypertension: Secondary | ICD-10-CM | POA: Diagnosis not present

## 2022-03-08 DIAGNOSIS — E041 Nontoxic single thyroid nodule: Secondary | ICD-10-CM | POA: Diagnosis not present

## 2022-03-08 LAB — DIFFERENTIAL
Abs Immature Granulocytes: 0.03 10*3/uL (ref 0.00–0.07)
Basophils Absolute: 0 10*3/uL (ref 0.0–0.1)
Basophils Relative: 1 %
Eosinophils Absolute: 0.2 10*3/uL (ref 0.0–0.5)
Eosinophils Relative: 4 %
Immature Granulocytes: 1 %
Lymphocytes Relative: 19 %
Lymphs Abs: 0.9 10*3/uL (ref 0.7–4.0)
Monocytes Absolute: 0.6 10*3/uL (ref 0.1–1.0)
Monocytes Relative: 14 %
Neutro Abs: 2.7 10*3/uL (ref 1.7–7.7)
Neutrophils Relative %: 61 %

## 2022-03-08 LAB — CBC
HCT: 40.4 % (ref 36.0–46.0)
Hemoglobin: 13 g/dL (ref 12.0–15.0)
MCH: 31 pg (ref 26.0–34.0)
MCHC: 32.2 g/dL (ref 30.0–36.0)
MCV: 96.2 fL (ref 80.0–100.0)
Platelets: 224 10*3/uL (ref 150–400)
RBC: 4.2 MIL/uL (ref 3.87–5.11)
RDW: 14.9 % (ref 11.5–15.5)
WBC: 4.4 10*3/uL (ref 4.0–10.5)
nRBC: 0 % (ref 0.0–0.2)

## 2022-03-08 LAB — COMPREHENSIVE METABOLIC PANEL
ALT: 16 U/L (ref 0–44)
AST: 24 U/L (ref 15–41)
Albumin: 3.2 g/dL — ABNORMAL LOW (ref 3.5–5.0)
Alkaline Phosphatase: 84 U/L (ref 38–126)
Anion gap: 13 (ref 5–15)
BUN: 17 mg/dL (ref 8–23)
CO2: 26 mmol/L (ref 22–32)
Calcium: 9.3 mg/dL (ref 8.9–10.3)
Chloride: 101 mmol/L (ref 98–111)
Creatinine, Ser: 1.07 mg/dL — ABNORMAL HIGH (ref 0.44–1.00)
GFR, Estimated: 53 mL/min — ABNORMAL LOW (ref >60)
Glucose, Bld: 108 mg/dL — ABNORMAL HIGH (ref 70–99)
Potassium: 4.4 mmol/L (ref 3.5–5.1)
Sodium: 140 mmol/L (ref 135–145)
Total Bilirubin: 0.6 mg/dL (ref 0.3–1.2)
Total Protein: 6.4 g/dL — ABNORMAL LOW (ref 6.5–8.1)

## 2022-03-08 LAB — APTT: aPTT: 30 seconds (ref 24–36)

## 2022-03-08 LAB — TROPONIN I (HIGH SENSITIVITY): Troponin I (High Sensitivity): 13 ng/L (ref <18)

## 2022-03-08 LAB — PROTIME-INR
INR: 1.1 (ref 0.8–1.2)
Prothrombin Time: 14.2 seconds (ref 11.4–15.2)

## 2022-03-08 MED ORDER — IOHEXOL 350 MG/ML SOLN
75.0000 mL | Freq: Once | INTRAVENOUS | Status: AC | PRN
  Administered 2022-03-08: 75 mL via INTRAVENOUS

## 2022-03-08 NOTE — ED Provider Triage Note (Signed)
Emergency Medicine Provider Triage Evaluation Note  Isabella [name] , a 81 y.o. female  was evaluated in triage.  Pt complains of left sided neck pain onset ~2 hours ago. Symptoms associated with hypertensive BP reading at home. She took 2 NTG about an hour apart from each other with some symptomatic improvement. States her neck pain at this time has resolved. Notes some associated tingling in her lower lip, but this has been present x 2 days and constant; unchanged with onset of neck pain. Describes it as feeling "punched in the lip". No vision changes or loss, fevers, syncope/near syncope, CP, SOB, extremity numbness or paresthesias, extremity weakness. No chronically anticoagulated.  Review of Systems  Positive: As above Negative: As above  Physical Exam  There were no vitals taken for this visit. Gen:   Awake, no distress   Resp:  Normal effort  MSK:   Moves extremities without difficulty. No meningismus. Other:  GCS 15. Speech is goal oriented. No deficits appreciated to CN III-XII; symmetric eyebrow raise, no facial drooping, tongue midline. Patient moves extremities symmetrically. Gait not assessed.   Medical Decision Making  Medically screening exam initiated at 2:07 AM.  Appropriate orders placed.  [name] was informed that the remainder of the evaluation will be completed by another provider, this initial triage assessment does not replace that evaluation, and the importance of remaining in the ED until their evaluation is complete.  L sided neck pain with preceding lower lip paresthesias - will obtain CTA head/neck, labs, EKG. Patient asymptomatic wrt neck pain at this time.   Antonietta Breach, PA-C 03/08/22 0211

## 2022-03-08 NOTE — ED Notes (Signed)
Patient at scan

## 2022-03-08 NOTE — ED Triage Notes (Signed)
Patient with left sided neck pain.  She denies any chest pain or shortness of breath.  She had a HA with the left neck pain.  She did take some nitro with the pain.  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  Patient also having blood in stool, she has been taking antibiotics and recently been taken off them.  She has been having some feeling of swelling of bottom lip, but it is not swollen, just the feeling.  She is not on any blood thinners.

## 2022-03-08 NOTE — ED Provider Notes (Signed)
[facility] EMERGENCY DEPARTMENT Provider Note   CSN: [id number] Arrival date & time: 03/08/22  0122     History  Chief Complaint  Patient presents with   Neck Pain    Isabella [name] is a 81 y.o. female.  HPI Patient with history of CAD x2, prior coronary stent presents with her daughter who assists with the history.  She is presenting due to new left-sided neck pain.  She notes that yesterday she developed pain in the left anterior lateral neck.  Some improvement with nitroglycerin, no chest pain, no dyspnea.  No syncope.  Patient has been good about taking her medication as directed, but did not take her antihypertensives today, as she has been awaiting evaluation.    Home Medications Prior to Admission medications   Medication Sig Start Date End Date Taking? Authorizing Provider  acetaminophen (TYLENOL) 500 MG tablet Take 500 mg by mouth every 6 (six) hours as needed for mild pain.   Yes [provider]  aspirin EC 81 MG tablet Take 81 mg by mouth daily at 2 PM.    Yes [provider]  atorvastatin (LIPITOR) 80 MG tablet Take 1 tablet (80 mg total) by mouth daily. 02/27/21  Yes Belva Crome, MD  chlorpheniramine (CHLOR-TRIMETON) 4 MG tablet Take 8 mg by mouth at bedtime.    Yes [provider]  Cholecalciferol (D3 PO) Take 2,000 mg by mouth.   Yes [provider]  furosemide (LASIX) 40 MG tablet Take 40 mg by mouth daily.   Yes [provider]  mesalamine (APRISO) 0.375 g 24 hr capsule Take 1,500 mg by mouth daily.   Yes [provider]  metoprolol tartrate (LOPRESSOR) 12.5 mg TABS tablet Take 12.5 mg by mouth 2 (two) times daily.   Yes [provider]  Multiple Vitamins-Minerals (MULTI FOR HER 50+) TABS Take 1 tablet by mouth daily.   Yes [provider]  nitroGLYCERIN (NITROSTAT) 0.4 MG SL tablet Place 1 tablet (0.4 mg total) under the tongue every 5 (five) minutes as needed for chest  pain. 10/04/21  Yes Belva Crome, MD  potassium chloride SA (K-DUR,KLOR-CON) 20 MEQ tablet Take 20 mEq by mouth daily with supper.    Yes [provider]  UNABLE TO FIND CPAP: At bedtime nightly and during all naps    [provider]      Allergies    Codeine, Imodium a-d [loperamide hcl], Neomycin, Penicillinase, Penicillins, Tramadol, Lisinopril, Hornet venom, Adhesive [tape], Erythromycin, Hydromorphone, and Percocet [oxycodone-acetaminophen]    Review of Systems   Review of Systems  All other systems reviewed and are negative.   Physical Exam Updated Vital Signs BP (!) 190/84   Pulse 62   Temp 98.2 F (36.8 C) (Oral)   Resp 15   Ht 5' 1"  (1.549 m)   Wt 95.3 kg   SpO2 100%   BMI 39.68 kg/m  Physical Exam Vitals and nursing note reviewed.  Constitutional:      General: She is not in acute distress.    Appearance: She is well-developed.  HENT:     Head: Normocephalic and atraumatic.  Eyes:     Conjunctiva/sclera: Conjunctivae normal.  Cardiovascular:     Rate and Rhythm: Normal rate and regular rhythm.  Pulmonary:     Effort: Pulmonary effort is normal. No respiratory distress.     Breath sounds: Normal breath sounds. No stridor.  Abdominal:     General: There is no distension.  Skin:    General: Skin is warm and dry.  Neurological:     General: No focal deficit present.     Mental Status: She is alert and oriented to person, place, and time.     Cranial Nerves: No cranial nerve deficit.     Motor: No weakness.  Psychiatric:        Mood and Affect: Mood normal.     ED Results / Procedures / Treatments   Labs (all labs ordered are listed, but only abnormal results are displayed) Labs Reviewed  COMPREHENSIVE METABOLIC PANEL - Abnormal; Notable for the following components:      Result Value   Glucose, Bld 108 (*)    Creatinine, Ser 1.07 (*)    Total Protein 6.4 (*)    Albumin 3.2 (*)    GFR, Estimated 53 (*)    All other components  within normal limits  PROTIME-INR  APTT  CBC  DIFFERENTIAL  TROPONIN I (HIGH SENSITIVITY)    EKG EKG Interpretation  Date/Time:  Friday March 08 2022 02:11:23 EDT Ventricular Rate:  58 PR Interval:  252 QRS Duration: 74 QT Interval:  468 QTC Calculation: 459 R Axis:   -14 Text Interpretation: Sinus bradycardia with 1st degree A-V block Otherwise normal ECG When compared with ECG of 28-Jan-2018 06:41, No significant change was found Confirmed by Delora Fuel ([id number]) on 03/08/2022 4:56:43 AM  Radiology CT ANGIO HEAD NECK W WO CM  Result Date: 03/08/2022 CLINICAL DATA:  81 year old female with neck pain. EXAM: CT ANGIOGRAPHY HEAD AND NECK TECHNIQUE: Multidetector CT imaging of the head and neck was performed using the standard protocol during bolus administration of intravenous contrast. Multiplanar CT image reconstructions and MIPs were obtained to evaluate the vascular anatomy. Carotid stenosis measurements (when applicable) are obtained utilizing NASCET criteria, using the distal internal carotid diameter as the denominator. RADIATION DOSE REDUCTION: This exam was performed according to the departmental dose-optimization program which includes automated exposure control, adjustment of the mA and/or kV according to patient size and/or use of iterative reconstruction technique. CONTRAST:  4m OMNIPAQUE IOHEXOL 350 MG/ML SOLN COMPARISON:  Chest CT 01/16/2022.  Thyroid ultrasound 05/01/2018. FINDINGS: CT HEAD Brain: Cerebral volume is within normal limits for age. No midline shift, ventriculomegaly, mass effect, evidence of mass lesion, intracranial hemorrhage or evidence of cortically based acute infarction. Gray-white matter differentiation appears symmetric and within normal limits for age. Calvarium and skull base: No acute osseous abnormality identified. Hyperostosis of the calvarium, normal variant. Paranasal sinuses: Visualized paranasal sinuses and mastoids are clear. Tympanic cavities  are clear. Orbits: No acute orbit or scalp soft tissue finding. CTA NECK Skeleton: Bilateral TMJ degeneration. Widespread cervical spine degeneration, especially facet arthropathy. And beginning at C6, there is multilevel vertebral ankylosis from combined anterior endplate osteophytes and upper thoracic facet ankylosis. No acute osseous abnormality identified. Upper chest: Visible small upper lung nodules are stable from the chest CT last month, evaluated on previous imaging and see full discussion on that exam Other neck: Stable hypoenhancing right thyroid nodule This has been evaluated on previous imaging. (ref: J Am Coll Radiol. 2015 Feb;12(2): 143-50).No acute finding. Aortic arch: 3 vessel arch configuration. Partially visible calcified arch atherosclerosis. Right carotid system: Mildly tortuous brachiocephalic artery and right CCA without plaque or stenosis. Negative right carotid bifurcation. Tortuous right ICA without stenosis to the skull base. Left carotid system: Similar tortuosity with no plaque or stenosis. Vertebral arteries: No atherosclerosis or stenosis. Dominant left vertebral artery. Both vertebrals are tortuous to  the skull base without stenosis. CTA HEAD Posterior circulation: Dominant left vertebral V4 segment and tortuous vertebrobasilar junction. Distal right vertebral artery is diminutive beyond the PICA origin but contributes to the vertebrobasilar junction. Tortuous basilar artery. No stenosis. Patent SCA and PCA origins. Posterior communicating arteries are diminutive or absent. Bilateral PCA branches are patent and within normal limits. Anterior circulation: Both ICA siphons are patent with mild tortuosity and minimal siphon plaque. No stenosis. Patent carotid termini, MCA and ACA origins. Tortuous ACA A1 segments and the left is dominant, the right diminutive. Anterior communicating artery and bilateral ACA branches are within normal limits. Both MCA M1 segments are tortuous and mildly  irregular, but without stenosis (series 14, image 22. No MCA branch occlusion identified. Venous sinuses: Early contrast timing, not evaluated. Anatomic variants: Dominant left vertebral artery, distal right V4 segment is diminutive. Dominant left ACA A1 segment, right is diminutive. Review of the MIP images confirms the above findings IMPRESSION: 1. Negative for large vessel occlusion. Generalized arterial tortuosity but mild for age atherosclerosis and no significant stenosis in the head or neck. 2. Normal for age CT appearance of the brain. 3. Advanced cervical spine degeneration superimposed on Diffuse idiopathic skeletal hyperostosis (DISH), C6 through upper thoracic spinal ankylosis. 4. Visible upper Chest stable from CT last month, (please see that report) Electronically Signed   By: Genevie Ann M.D.   On: 03/08/2022 08:29    Procedures Procedures    Medications Ordered in ED Medications  iohexol (OMNIPAQUE) 350 MG/ML injection 75 mL (75 mLs Intravenous Contrast Given 03/08/22 0700)    ED Course/ Medical Decision Making/ A&P                           Medical Decision Making Adult female with multiple medical issues including hypertension, SCAD, CAD presents with neck pain.  Initial concern for ACS equivalent versus musculoskeletal etiology given her history of arthritis as well.  Findings here are reassuring, troponin normal, given the duration of symptoms, this is likely reflective of known cardiac etiology.  Similar reassurance taken from ECG. Labs in general reassuring as well.  CT performed consistent with degenerative processes, no obvious vascular abnormality.  Patient, daughter, both comfortable with discharge though hospitalization was a consideration.  Patient has follow-up scheduled in 72 hours.  Amount and/or Complexity of Data Reviewed Independent Historian: caregiver External Data Reviewed: notes. Labs:  Decision-making details documented in ED Course. Radiology:   Decision-making details documented in ED Course. ECG/medicine tests:  Decision-making details documented in ED Course.  Risk Prescription drug management. Decision regarding hospitalization.           Final Clinical Impression(s) / ED Diagnoses Final diagnoses:  Neck pain     Carmin Muskrat, MD 03/08/22 1528

## 2022-03-08 NOTE — Discharge Instructions (Signed)
As discussed, your evaluation today has been largely reassuring.  But, it is important that you monitor your condition carefully, and do not hesitate to return to the ED if you develop new, or concerning changes in your condition. ? ?Otherwise, please follow-up with your physician for appropriate ongoing care. ? ?

## 2022-03-11 DIAGNOSIS — M810 Age-related osteoporosis without current pathological fracture: Secondary | ICD-10-CM | POA: Diagnosis not present

## 2022-03-11 DIAGNOSIS — D500 Iron deficiency anemia secondary to blood loss (chronic): Secondary | ICD-10-CM | POA: Diagnosis not present

## 2022-03-11 DIAGNOSIS — R252 Cramp and spasm: Secondary | ICD-10-CM | POA: Diagnosis not present

## 2022-03-11 DIAGNOSIS — I700 Atherosclerosis of aorta: Secondary | ICD-10-CM | POA: Diagnosis not present

## 2022-03-11 DIAGNOSIS — Z905 Acquired absence of kidney: Secondary | ICD-10-CM | POA: Diagnosis not present

## 2022-03-11 DIAGNOSIS — R413 Other amnesia: Secondary | ICD-10-CM | POA: Diagnosis not present

## 2022-03-11 DIAGNOSIS — N3281 Overactive bladder: Secondary | ICD-10-CM | POA: Diagnosis not present

## 2022-03-11 DIAGNOSIS — K51019 Ulcerative (chronic) pancolitis with unspecified complications: Secondary | ICD-10-CM | POA: Diagnosis not present

## 2022-03-11 DIAGNOSIS — H6992 Unspecified Eustachian tube disorder, left ear: Secondary | ICD-10-CM | POA: Diagnosis not present

## 2022-03-11 DIAGNOSIS — Z0000 Encounter for general adult medical examination without abnormal findings: Secondary | ICD-10-CM | POA: Diagnosis not present

## 2022-03-11 DIAGNOSIS — I10 Essential (primary) hypertension: Secondary | ICD-10-CM | POA: Diagnosis not present

## 2022-03-12 DIAGNOSIS — K5100 Ulcerative (chronic) pancolitis without complications: Secondary | ICD-10-CM | POA: Diagnosis not present

## 2022-03-12 DIAGNOSIS — R194 Change in bowel habit: Secondary | ICD-10-CM | POA: Diagnosis not present

## 2022-05-14 ENCOUNTER — Other Ambulatory Visit: Payer: Self-pay

## 2022-05-14 MED ORDER — ATORVASTATIN CALCIUM 80 MG PO TABS: 80.0000 mg | ORAL_TABLET | Freq: Every day | ORAL | 2 refills | Status: DC

## 2022-05-24 DIAGNOSIS — C480 Malignant neoplasm of retroperitoneum: Secondary | ICD-10-CM | POA: Diagnosis not present

## 2022-05-24 DIAGNOSIS — D381 Neoplasm of uncertain behavior of trachea, bronchus and lung: Secondary | ICD-10-CM | POA: Diagnosis not present

## 2022-05-28 ENCOUNTER — Other Ambulatory Visit: Payer: Self-pay | Admitting: Urology

## 2022-05-28 DIAGNOSIS — C480 Malignant neoplasm of retroperitoneum: Secondary | ICD-10-CM

## 2022-06-13 DIAGNOSIS — D869 Sarcoidosis, unspecified: Secondary | ICD-10-CM | POA: Diagnosis not present

## 2022-06-13 DIAGNOSIS — N1832 Chronic kidney disease, stage 3b: Secondary | ICD-10-CM | POA: Diagnosis not present

## 2022-06-13 DIAGNOSIS — I129 Hypertensive chronic kidney disease with stage 1 through stage 4 chronic kidney disease, or unspecified chronic kidney disease: Secondary | ICD-10-CM | POA: Diagnosis not present

## 2022-06-13 DIAGNOSIS — I2510 Atherosclerotic heart disease of native coronary artery without angina pectoris: Secondary | ICD-10-CM | POA: Diagnosis not present

## 2022-06-13 DIAGNOSIS — E785 Hyperlipidemia, unspecified: Secondary | ICD-10-CM | POA: Diagnosis not present

## 2022-06-13 DIAGNOSIS — Z905 Acquired absence of kidney: Secondary | ICD-10-CM | POA: Diagnosis not present

## 2022-06-13 DIAGNOSIS — Q6102 Congenital multiple renal cysts: Secondary | ICD-10-CM | POA: Diagnosis not present

## 2022-06-13 DIAGNOSIS — N390 Urinary tract infection, site not specified: Secondary | ICD-10-CM | POA: Diagnosis not present

## 2022-06-17 DIAGNOSIS — G4733 Obstructive sleep apnea (adult) (pediatric): Secondary | ICD-10-CM | POA: Diagnosis not present

## 2022-06-24 DIAGNOSIS — I872 Venous insufficiency (chronic) (peripheral): Secondary | ICD-10-CM | POA: Diagnosis not present

## 2022-06-24 DIAGNOSIS — I700 Atherosclerosis of aorta: Secondary | ICD-10-CM | POA: Diagnosis not present

## 2022-06-24 DIAGNOSIS — R030 Elevated blood-pressure reading, without diagnosis of hypertension: Secondary | ICD-10-CM | POA: Diagnosis not present

## 2022-06-24 DIAGNOSIS — K5100 Ulcerative (chronic) pancolitis without complications: Secondary | ICD-10-CM | POA: Diagnosis not present

## 2022-07-02 ENCOUNTER — Ambulatory Visit
Admission: RE | Admit: 2022-07-02 | Discharge: 2022-07-02 | Disposition: A | Discharge status: Home / Self Care | Payer: Medicare Other | Source: Ambulatory Visit | Attending: Urology | Admitting: Urology

## 2022-07-02 DIAGNOSIS — N281 Cyst of kidney, acquired: Secondary | ICD-10-CM | POA: Diagnosis not present

## 2022-07-02 DIAGNOSIS — C480 Malignant neoplasm of retroperitoneum: Secondary | ICD-10-CM

## 2022-07-02 MED ORDER — GADOPICLENOL 0.5 MMOL/ML IV SOLN
10.0000 mL | Freq: Once | INTRAVENOUS | Status: AC | PRN
  Administered 2022-07-02: 10 mL via INTRAVENOUS

## 2022-07-03 ENCOUNTER — Ambulatory Visit: Payer: Medicare Other | Admitting: Vascular Surgery

## 2022-07-03 ENCOUNTER — Encounter: Payer: Self-pay | Admitting: Vascular Surgery

## 2022-07-03 VITALS — BP 186/96 | HR 56 | Temp 98.0°F | Resp 18 | Ht 60.0 in | Wt 215.8 lb

## 2022-07-03 DIAGNOSIS — I872 Venous insufficiency (chronic) (peripheral): Secondary | ICD-10-CM | POA: Diagnosis not present

## 2022-07-03 NOTE — Progress Notes (Signed)
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  She has hyperpigmentation and a positive Stemmer sign.  We have discussed the importance of daily leg elevation and the proper positioning for this.  I encouraged her to continue to use her compression stockings.  She has had some problems with the swelling down and she could also use Ace bandages for compression therapy as needed.  I encouraged her to exercise.  We specifically discussed walking and water aerobics.  I encouraged her to avoid prolonged sitting and standing.  We discussed importance of maintaining a healthy weight as central obesity especially increases lower extremity venous pressure.  If her swelling progresses I think she would be a candidate for a lymphedema pump however currently she is not interested in pursuing that unless her swelling progresses.  I would like to see her back in 6 weeks and we will get a formal venous reflux test on the right where the swelling is more significant.  Of note based on her Doppler assessment today she had no evidence of significant arterial insufficiency.  REASON FOR CONSULT:    Chronic venous insufficiency.  The consult is requested by Dr.Kathryn Lindell Noe.    HPI:   Isabella [name] is a 82 y.o. female who presents with bilateral lower extremity swelling.  She states that she has had swelling for many years but recently the swelling has gradually progressed.  She began having swelling in the right leg after knee replacement many years ago.  She had some cellulitis in the right leg in the summer.  She was started on antibiotics and had a flareup of her ulcerative colitis and is currently on prednisone.  She denies any previous history of DVT.  She does have a history of phlebitis when she was younger.  She does elevate her legs is much as she can.  She has a hard  time wearing compression stockings because they roll down.  She has a history of coronary artery disease and a remote history of an MI.  She also has a history of congestive heart failure, obstructive sleep apnea, and a left nephrectomy.  Past Medical History:  Diagnosis Date   Arthritis    Complication of anesthesia    Took a while to wake up with knee. Avoid pancuronium   Coronary artery disease    MI 2012, 2019, Dr. 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 (states it was a partial)   Headache    migraines in her younger years   Hemorrhoids    High cholesterol    History of blood transfusion    Hx of adenomatous colonic polyps    Hypertension    Liposarcoma of retroperitoneum (HCC)    Lung nodule    Lymphedema    BLE   Myocardial infarction ([place])    x2 2012 and 2019   Nocturnal leg cramps    Obesity    Sleep apnea    uses cpap    Family History  Problem Relation Age of Onset   Hypertension Mother    Pancreatic cancer Father    Breast cancer Maternal Grandmother    Prostate  cancer Maternal Uncle    Cancer Paternal Aunt        unknown   Cancer Paternal Uncle        unknown   Cancer Maternal Uncle        unknown   Cancer Maternal Uncle        unknown    SOCIAL HISTORY: Social History   Tobacco Use   Smoking status: Never   Smokeless tobacco: Never  Substance Use Topics   Alcohol use: No    Allergies  Allergen Reactions   Codeine Shortness Of Breath and Other (See Comments)    Made back hurt; does not affect liver per patient but has has caused difficulty breathing/tolerates Percocet only in small doses Back hurts does not affect liver per patient but has has caused difficulty breathing but tolerates Percocet only at small doses   Imodium A-D [Loperamide Hcl]  Shortness Of Breath and Other (See Comments)    Caused a pain & "squeezing" sensation in the lungs also   Neomycin Swelling and Other (See Comments)    Reaction to eye ointment - eyes became swollen shut Makes wounds ooze   Penicillinase Hives, Shortness Of Breath and Rash    Has patient had a PCN reaction causing immediate rash, facial/tongue/throat swelling, SOB or lightheadedness with hypotension: Yes Has patient had a PCN reaction causing severe rash involving mucus membranes or skin necrosis: No Has patient had a PCN reaction that required hospitalization:No Has patient had a PCN reaction occurring within the last 10 years: No If all of the above answers are "NO", then may proceed with Cephalosporin use.    Penicillins Hives, Shortness Of Breath and Rash    Reaction @ 18-yrs old Has patient had a PCN reaction causing immediate rash, facial/tongue/throat swelling, SOB or lightheadedness with hypotension: Yes Has patient had a PCN reaction causing severe rash involving mucus membranes or skin necrosis: No Has patient had a PCN reaction that required hospitalization: No Has patient had a PCN reaction occurring within the last 10 years: No If all of the above answers are "NO", then may proceed with Cephalosporin use.   Tramadol Shortness Of Breath and Other (See Comments)    (Per patient) "felt like lungs were being squeezed"    Lisinopril Cough   Hornet Venom Swelling    Swelling at site of sting   Adhesive [Tape]     Tears the skin, paper tape is ok   Erythromycin Diarrhea   Hydromorphone Other (See Comments)    Severe Lethargy     Percocet [Oxycodone-Acetaminophen] Other (See Comments)    Confusion     Current Outpatient Medications  Medication Sig Dispense Refill   acetaminophen (TYLENOL) 500 MG tablet Take 500 mg by mouth every 6 (six) hours as needed for mild pain.     aspirin EC 81 MG tablet Take 81 mg by mouth daily at 2 PM.      atorvastatin (LIPITOR) 80 MG tablet  Take 1 tablet (80 mg total) by mouth daily. 90 tablet 2   chlorpheniramine (CHLOR-TRIMETON) 4 MG tablet Take 8 mg by mouth at bedtime.      Cholecalciferol (D3 PO) Take 2,000 mg by mouth.     furosemide (LASIX) 40 MG tablet Take 40 mg by mouth daily.     mesalamine (APRISO) 0.375 g 24 hr capsule Take 1,500 mg by mouth daily.     metoprolol tartrate (LOPRESSOR) 12.5 mg TABS tablet Take 12.5 mg by mouth 2 (two) times daily.  Multiple Vitamins-Minerals (MULTI FOR HER 50+) TABS Take 1 tablet by mouth daily.     nitroGLYCERIN (NITROSTAT) 0.4 MG SL tablet Place 1 tablet (0.4 mg total) under the tongue every 5 (five) minutes as needed for chest pain. 25 tablet 5   potassium chloride SA (K-DUR,KLOR-CON) 20 MEQ tablet Take 20 mEq by mouth daily with supper.      predniSONE (DELTASONE) 20 MG tablet Take 20 mg by mouth daily with breakfast.     UNABLE TO FIND CPAP: At bedtime nightly and during all naps     No current facility-administered medications for this visit.    REVIEW OF SYSTEMS:  [X]$  denotes positive finding, [ ]$  denotes negative finding Cardiac  Comments:  Chest pain or chest pressure:    Shortness of breath upon exertion: x   Short of breath when lying flat:    Irregular heart rhythm:        Vascular    Pain in calf, thigh, or hip brought on by ambulation:    Pain in feet at night that wakes you up from your sleep:     Blood clot in your veins:    Leg swelling:  x       Pulmonary    Oxygen at home:    Productive cough:     Wheezing:         Neurologic    Sudden weakness in arms or legs:     Sudden numbness in arms or legs:     Sudden onset of difficulty speaking or slurred speech:    Temporary loss of vision in one eye:     Problems with dizziness:         Gastrointestinal    Blood in stool:     Vomited blood:         Genitourinary    Burning when urinating:     Blood in urine:        Psychiatric    Major depression:         Hematologic    Bleeding problems:     Problems with blood clotting too easily:        Skin    Rashes or ulcers:        Constitutional    Fever or chills:    -  PHYSICAL EXAM:   Vitals:   07/03/22 1114  BP: (!) 186/96  Pulse: (!) 56  Resp: 18  Temp: 98 F (36.7 C)  TempSrc: Temporal  SpO2: 97%  Weight: 215 lb 12.8 oz (97.9 kg)  Height: 5' (1.524 m)   Body mass index is 42.15 kg/m. GENERAL: The patient is a well-nourished female, in no acute distress. The vital signs are documented above. CARDIAC: There is a regular rate and rhythm.  VASCULAR: I do not detect carotid bruits. Because of her leg swelling I cannot palpate pedal pulses. With the Doppler she does have a biphasic dorsalis pedis and posterior tibial signal bilaterally. She has bilateral lower extremity swelling which is more significant on the right side.  She has hyperpigmentation bilaterally.       PULMONARY: There is good air exchange bilaterally without wheezing or rales. ABDOMEN: Soft and non-tender with normal pitched bowel sounds.  MUSCULOSKELETAL: There are no major deformities. NEUROLOGIC: No focal weakness or paresthesias are detected. SKIN: There are no ulcers or rashes noted. PSYCHIATRIC: The patient has a normal affect.  DATA:    LABS: Her most recent labs on 03/08/2022 showed a creatinine of  1.07 with a GFR 53.  Deitra Mayo [facility]

## 2022-07-05 ENCOUNTER — Other Ambulatory Visit: Payer: Self-pay

## 2022-07-05 DIAGNOSIS — I872 Venous insufficiency (chronic) (peripheral): Secondary | ICD-10-CM

## 2022-07-08 DIAGNOSIS — I10 Essential (primary) hypertension: Secondary | ICD-10-CM | POA: Diagnosis not present

## 2022-07-08 DIAGNOSIS — K219 Gastro-esophageal reflux disease without esophagitis: Secondary | ICD-10-CM | POA: Diagnosis not present

## 2022-07-08 DIAGNOSIS — E7800 Pure hypercholesterolemia, unspecified: Secondary | ICD-10-CM | POA: Diagnosis not present

## 2022-07-08 DIAGNOSIS — I2510 Atherosclerotic heart disease of native coronary artery without angina pectoris: Secondary | ICD-10-CM | POA: Diagnosis not present

## 2022-07-08 DIAGNOSIS — M810 Age-related osteoporosis without current pathological fracture: Secondary | ICD-10-CM | POA: Diagnosis not present

## 2022-07-10 DIAGNOSIS — K5100 Ulcerative (chronic) pancolitis without complications: Secondary | ICD-10-CM | POA: Diagnosis not present

## 2022-07-30 DIAGNOSIS — R310 Gross hematuria: Secondary | ICD-10-CM | POA: Diagnosis not present

## 2022-07-30 DIAGNOSIS — R311 Benign essential microscopic hematuria: Secondary | ICD-10-CM | POA: Diagnosis not present

## 2022-08-14 ENCOUNTER — Encounter: Payer: Self-pay | Admitting: Vascular Surgery

## 2022-08-14 ENCOUNTER — Ambulatory Visit: Payer: Medicare Other | Admitting: Vascular Surgery

## 2022-08-14 ENCOUNTER — Ambulatory Visit (HOSPITAL_COMMUNITY)
Admission: RE | Admit: 2022-08-14 | Discharge: 2022-08-14 | Disposition: A | Discharge status: Home / Self Care | Payer: Medicare Other | Source: Ambulatory Visit | Attending: Vascular Surgery | Admitting: Vascular Surgery

## 2022-08-14 VITALS — BP 184/90 | HR 80 | Temp 98.1°F | Resp 18 | Ht 60.0 in | Wt 222.3 lb

## 2022-08-14 DIAGNOSIS — I890 Lymphedema, not elsewhere classified: Secondary | ICD-10-CM | POA: Diagnosis not present

## 2022-08-14 DIAGNOSIS — I872 Venous insufficiency (chronic) (peripheral): Secondary | ICD-10-CM | POA: Diagnosis not present

## 2022-08-14 NOTE — Progress Notes (Signed)
REASON FOR VISIT:   Follow-up lymphedema  MEDICAL ISSUES:   LYMPHEDEMA: The patient's venous duplex scan today did not show any significant superficial or deep venous reflux.  Therefore I think her swelling is secondary to lymphedema.  She has advanced changes with hyperkeratosis and hyperpigmentation.  She has continued to use her compression stockings and has been elevating her legs for the last 6 weeks since I saw her last.  She also exercises as best she can.  I think she would benefit from the BioTAB Half Leg lymphedema pump for both lower extremities.  In this way she can use it combined with proper leg elevation to maximize effectiveness of the pump.  We will try to help her obtain the pump and then I will plan on seeing her back in 4 months.  We have again discussed importance of proper leg elevation, continued use of compression therapy, and exercise.  She will call sooner if things change.  HPI:   Isabella [name] is a pleasant 82 y.o. female who I last saw on 07/03/2022.  She had originally presented with bilateral lower extremity swelling.  She had no previous history of DVT.  She was trying to elevate her legs but had a hard time wearing compression stockings because they would roll down.  She does have a history of congestive heart failure.  I felt that she had combined chronic venous insufficiency and lymphedema.  We discussed importance of leg elevation and the proper positioning for this.  We discussed the continued use of compression therapy.  I also encouraged her to exercise.  We also discussed the importance of maintaining a healthy weight.  I felt that if her swelling did not improve she would be a candidate for a lymphedema pump however at that time, she was not interested unless the swelling progressed significantly.  I brought her back in for a 6-week follow-up visit with formal venous reflux testing.  Since I saw her last there is been no significant improvement in her leg  swelling bilaterally.  She is also developed worsening swelling on the left side.  She does continue to use her compression therapy and try to ambulate is much as possible.  She also elevates her legs daily.  She denies any open wounds or significant lymphorrhea.   Past Medical History:  Diagnosis Date   Arthritis    Complication of anesthesia    Took a while to wake up with knee. Avoid pancuronium   Coronary artery disease    MI 2012, 2019, Dr. [name]   Dyspnea    with walking at times   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 (states it was a partial)   Headache    migraines in her younger years   Hemorrhoids    High cholesterol    History of blood transfusion    Hx of adenomatous colonic polyps    Hypertension    Liposarcoma of retroperitoneum    Lung nodule    Lymphedema    BLE   Myocardial infarction    x2 2012 and 2019   Nocturnal leg cramps    Obesity    Sleep apnea    uses cpap    Family History  Problem Relation Age of Onset   Hypertension Mother    Pancreatic cancer Father  Breast cancer Maternal Grandmother    Prostate cancer Maternal Uncle    Cancer Paternal Aunt        unknown   Cancer Paternal Uncle        unknown   Cancer Maternal Uncle        unknown   Cancer Maternal Uncle        unknown    SOCIAL HISTORY: Social History   Tobacco Use   Smoking status: Never   Smokeless tobacco: Never  Substance Use Topics   Alcohol use: No    Allergies  Allergen Reactions   Codeine Shortness Of Breath and Other (See Comments)    Made back hurt; does not affect liver per patient but has has caused difficulty breathing/tolerates Percocet only in small doses Back hurts does not affect liver per patient but has has caused difficulty breathing but tolerates Percocet only at small doses   Imodium A-D [Loperamide Hcl]  Shortness Of Breath and Other (See Comments)    Caused a pain & "squeezing" sensation in the lungs also   Neomycin Swelling and Other (See Comments)    Reaction to eye ointment - eyes became swollen shut Makes wounds ooze   Penicillinase Hives, Shortness Of Breath and Rash    Has patient had a PCN reaction causing immediate rash, facial/tongue/throat swelling, SOB or lightheadedness with hypotension: Yes Has patient had a PCN reaction causing severe rash involving mucus membranes or skin necrosis: No Has patient had a PCN reaction that required hospitalization:No Has patient had a PCN reaction occurring within the last 10 years: No If all of the above answers are "NO", then may proceed with Cephalosporin use.    Penicillins Hives, Shortness Of Breath and Rash    Reaction @ 18-yrs old Has patient had a PCN reaction causing immediate rash, facial/tongue/throat swelling, SOB or lightheadedness with hypotension: Yes Has patient had a PCN reaction causing severe rash involving mucus membranes or skin necrosis: No Has patient had a PCN reaction that required hospitalization: No Has patient had a PCN reaction occurring within the last 10 years: No If all of the above answers are "NO", then may proceed with Cephalosporin use.   Tramadol Shortness Of Breath and Other (See Comments)    (Per patient) "felt like lungs were being squeezed"    Lisinopril Cough   Hornet Venom Swelling    Swelling at site of sting   Adhesive [Tape]     Tears the skin, paper tape is ok   Erythromycin Diarrhea   Hydromorphone Other (See Comments)    Severe Lethargy     Percocet [Oxycodone-Acetaminophen] Other (See Comments)    Confusion     Current Outpatient Medications  Medication Sig Dispense Refill   acetaminophen (TYLENOL) 500 MG tablet Take 500 mg by mouth every 6 (six) hours as needed for mild pain.     aspirin EC 81 MG tablet Take 81 mg by mouth daily at 2 PM.      atorvastatin (LIPITOR) 80 MG tablet  Take 1 tablet (80 mg total) by mouth daily. 90 tablet 2   chlorpheniramine (CHLOR-TRIMETON) 4 MG tablet Take 8 mg by mouth at bedtime.      Cholecalciferol (D3 PO) Take 2,000 mg by mouth.     furosemide (LASIX) 40 MG tablet Take 40 mg by mouth daily.     mesalamine (APRISO) 0.375 g 24 hr capsule Take 1,500 mg by mouth daily.     metoprolol tartrate (LOPRESSOR) 12.5 mg TABS tablet Take  12.5 mg by mouth 2 (two) times daily.     Multiple Vitamins-Minerals (MULTI FOR HER 50+) TABS Take 1 tablet by mouth daily.     nitroGLYCERIN (NITROSTAT) 0.4 MG SL tablet Place 1 tablet (0.4 mg total) under the tongue every 5 (five) minutes as needed for chest pain. 25 tablet 5   potassium chloride SA (K-DUR,KLOR-CON) 20 MEQ tablet Take 10 mEq by mouth once.     predniSONE (DELTASONE) 20 MG tablet Take 10 mg by mouth daily with breakfast.     saccharomyces boulardii (FLORASTOR) 250 MG capsule Take 250 mg by mouth 2 (two) times daily.     UNABLE TO FIND CPAP: At bedtime nightly and during all naps     No current facility-administered medications for this visit.    REVIEW OF SYSTEMS:  [X]  denotes positive finding, [ ]  denotes negative finding Cardiac  Comments:  Chest pain or chest pressure:    Shortness of breath upon exertion:    Short of breath when lying flat:    Irregular heart rhythm:        Vascular    Pain in calf, thigh, or hip brought on by ambulation:    Pain in feet at night that wakes you up from your sleep:     Blood clot in your veins:    Leg swelling:  x       Pulmonary    Oxygen at home:    Productive cough:     Wheezing:         Neurologic    Sudden weakness in arms or legs:     Sudden numbness in arms or legs:     Sudden onset of difficulty speaking or slurred speech:    Temporary loss of vision in one eye:     Problems with dizziness:         Gastrointestinal    Blood in stool:     Vomited blood:         Genitourinary    Burning when urinating:     Blood in urine: x        Psychiatric    Major depression:         Hematologic    Bleeding problems:    Problems with blood clotting too easily:        Skin    Rashes or ulcers:        Constitutional    Fever or chills:     PHYSICAL EXAM:   Vitals:   08/14/22 1336  BP: (!) 184/90  Pulse: 80  Resp: 18  Temp: 98.1 F (36.7 C)  TempSrc: Temporal  SpO2: 97%  Weight: 222 lb 4.8 oz (100.8 kg)  Height: 5' (1.524 m)    GENERAL: The patient is a well-nourished female, in no acute distress. The vital signs are documented above. CARDIAC: There is a regular rate and rhythm.  VASCULAR: She has a biphasic posterior tibial and dorsalis pedis signal bilaterally with the Doppler. She has significant bilateral lower extremity swelling with nonpitting edema and a positive Stemmer sign.  She has hyperkeratosis and some hyperpigmentation bilaterally as documented in the photograph below.  PULMONARY: There is good air exchange bilaterally without wheezing or rales. ABDOMEN: Soft and non-tender with normal pitched bowel sounds.  MUSCULOSKELETAL: There are no major deformities or cyanosis. NEUROLOGIC: No focal weakness or paresthesias are detected. SKIN: There are no ulcers or rashes noted. PSYCHIATRIC: The patient has a normal affect.  DATA:  VENOUS REFLUX TEST: I have independently interpreted her venous reflux test today.  This was of the right lower extremity only.  There was no evidence of DVT.  There was no significant deep venous reflux.  There was reflux at the saphenofemoral junction only.  There was no significant superficial venous reflux.  Isabella [name] [facility] [phone]

## 2022-08-20 DIAGNOSIS — R310 Gross hematuria: Secondary | ICD-10-CM | POA: Diagnosis not present

## 2022-08-27 DIAGNOSIS — I890 Lymphedema, not elsewhere classified: Secondary | ICD-10-CM | POA: Diagnosis not present

## 2022-09-10 DIAGNOSIS — I10 Essential (primary) hypertension: Secondary | ICD-10-CM | POA: Diagnosis not present

## 2022-09-10 DIAGNOSIS — N1832 Chronic kidney disease, stage 3b: Secondary | ICD-10-CM | POA: Diagnosis not present

## 2022-09-10 DIAGNOSIS — Z23 Encounter for immunization: Secondary | ICD-10-CM | POA: Diagnosis not present

## 2022-09-18 ENCOUNTER — Encounter (HOSPITAL_BASED_OUTPATIENT_CLINIC_OR_DEPARTMENT_OTHER): Payer: Self-pay | Admitting: Pulmonary Disease

## 2022-09-18 ENCOUNTER — Ambulatory Visit (HOSPITAL_BASED_OUTPATIENT_CLINIC_OR_DEPARTMENT_OTHER): Payer: Medicare Other | Admitting: Pulmonary Disease

## 2022-09-18 VITALS — BP 130/80 | HR 73 | Ht 60.0 in | Wt 227.0 lb

## 2022-09-18 DIAGNOSIS — G4733 Obstructive sleep apnea (adult) (pediatric): Secondary | ICD-10-CM | POA: Diagnosis not present

## 2022-09-18 DIAGNOSIS — D869 Sarcoidosis, unspecified: Secondary | ICD-10-CM | POA: Diagnosis not present

## 2022-09-18 NOTE — Progress Notes (Signed)
   Subjective:    Patient ID: Isabella [name], female    DOB: [birth year], 82 y.o.   MRN: [id number]  HPI  82 yo never smoker for follow-up of obstructive sleep apnea & Pulmonary nodules She had a LLL nodule biopsy in 2014  showing nonnecrotizing granuloma  PMH : ulcerative colitis and corneal transplants      RLL nodule noted since 2013,growth noted in 2017, gradual increase from 12 to 16 mm by 2019,  PET negative in 2013, left lower lobe nodule had lighted up and hence this was biopsied and showed initially negative in 04/2012 and then nonnecrotizing granuloma  05/2012 on repeat biopsy In 2019 CT and PET scan showed hypermetabolic enlarging right lower lobe nodule, this enlarged to 25 mm, she underwent CT guided biopsy on 12/23 which showed granulomatous inflammation   Chief Complaint  Patient presents with   Follow-up    Pt states she has been doing okay since last visit. States she has had some SOB.    Last seen 06/2018 She had an ulcerative colitis flare and has been on prednisone since January this is gradually been tapered now to 5 mg every other day with plan to stop in a week. Unfortunately she has gained 30 pounds and developed increased peripheral edema.  She has been started on pneumatic compression for lymphedema She takes Lasix 40 mg daily.  She complains of mild shortness of breath She is being followed by urology status post left nephrectomy MRI abdomen 06/2022. We reviewed CT chest from September  She denies any problems with mask or pressure.  She is very compliant with her CPAP machine   Significant tests/ events reviewed  CT chest without con 01/2022 unchanged nodules, right lower lobe 1.5 x 1.1 and 1.4 x 1.4, left lower lobe 1.9 x 1.1 cm  PET 01/2016 Hypermetabolic right lower lobe nodule, SUV 4.0 Scattered pulmonary nodules unchanged from 2015, right upper lobe 6 mm nodule SUV 2.0   03/2016 ENB neg     PFTs 06/2018 normal, no airway obstruction PFTs 03/2012  nml  Review of Systems neg for any significant sore throat, dysphagia, itching, sneezing, nasal congestion or excess/ purulent secretions, fever, chills, sweats, unintended wt loss, pleuritic or exertional cp, hempoptysis, orthopnea pnd or change in chronic leg swelling. Also denies presyncope, palpitations, heartburn, abdominal pain, nausea, vomiting, diarrhea or change in bowel or urinary habits, dysuria,hematuria, rash, arthralgias, visual complaints, headache, numbness weakness or ataxia.     Objective:   Physical Exam  Gen. Pleasant, obese, in no distress ENT - no lesions, no post nasal drip Neck: No JVD, no thyromegaly, no carotid bruits Lungs: no use of accessory muscles, no dullness to percussion, decreased without rales or rhonchi  Cardiovascular: Rhythm regular, heart sounds  normal, no murmurs or gallops, no peripheral edema Musculoskeletal: No deformities, no cyanosis or clubbing , no tremors       Assessment & Plan:

## 2022-09-18 NOTE — Assessment & Plan Note (Signed)
Pulmonary nodules showing granulomas on repeat biopsy.  Possible sarcoidosis or related to ulcerative colitis in someway.  Since lung function is normal and we have taken a wait and watch approach.

## 2022-09-18 NOTE — Patient Instructions (Signed)
Increase CPAP to 14 cm Trial of airfit F30 fullface mask

## 2022-09-18 NOTE — Assessment & Plan Note (Signed)
CPAP download was reviewed which shows residual AHI of 10/hour on CPAP 12 cm with moderate leak.  Uncertain nights residual AHI increases as much as 20/hour.  I feel this is related to a 30 pound weight gain. Will increase CPAP to 14 cm and check download.  If she continues to have breakthrough events then she may need auto settings to allow for increased pressure. I also asked her to trial AirFit F30 fullface mask Weight loss encouraged, compliance with goal of at least 4-6 hrs every night is the expectation. Advised against medications with sedative side effects Cautioned against driving when sleepy - understanding that sleepiness will vary on a day to day basis

## 2022-09-23 DIAGNOSIS — G4733 Obstructive sleep apnea (adult) (pediatric): Secondary | ICD-10-CM | POA: Diagnosis not present

## 2022-11-15 DIAGNOSIS — H524 Presbyopia: Secondary | ICD-10-CM | POA: Diagnosis not present

## 2022-11-15 DIAGNOSIS — H04202 Unspecified epiphora, left lacrimal gland: Secondary | ICD-10-CM | POA: Diagnosis not present

## 2022-11-15 DIAGNOSIS — Z947 Corneal transplant status: Secondary | ICD-10-CM | POA: Diagnosis not present

## 2022-11-15 DIAGNOSIS — H52203 Unspecified astigmatism, bilateral: Secondary | ICD-10-CM | POA: Diagnosis not present

## 2022-11-15 DIAGNOSIS — Z961 Presence of intraocular lens: Secondary | ICD-10-CM | POA: Diagnosis not present

## 2022-12-13 DIAGNOSIS — K5120 Ulcerative (chronic) proctitis without complications: Secondary | ICD-10-CM | POA: Diagnosis not present

## 2023-01-01 ENCOUNTER — Ambulatory Visit: Payer: Medicare Other | Admitting: Vascular Surgery

## 2023-01-01 ENCOUNTER — Encounter: Payer: Self-pay | Admitting: Vascular Surgery

## 2023-01-01 VITALS — BP 132/84 | HR 72 | Temp 98.0°F | Resp 18 | Ht 60.0 in | Wt 219.5 lb

## 2023-01-01 DIAGNOSIS — I872 Venous insufficiency (chronic) (peripheral): Secondary | ICD-10-CM

## 2023-01-01 DIAGNOSIS — I890 Lymphedema, not elsewhere classified: Secondary | ICD-10-CM

## 2023-01-01 NOTE — Progress Notes (Unsigned)
REASON FOR VISIT:   Follow-up of lymphedema  MEDICAL ISSUES:   LYMPHEDEMA: Her bilateral lower extremity edema has improved since she has been using her lymphedema pump.  She has a half leg BioTAB.  She has not been using it in conjunction with leg elevation so I have encouraged her to begin using it while elevating her legs to get maximum benefit.  Otherwise the pump is working against gravity.  We have again discussed the proper position for leg elevation.  She is using it for 1 hour twice a day and is therefore being very compliant with this.  She cannot really wear compression stockings so she wears tight leggings.  We have also discussed the importance of exercise.  We will see her back as needed.  HPI:   Isabella [name] is a pleasant 82 y.o. female who I had seen with lateral lower extremity swelling initially on 07/03/2022.  We tried conservative measures as I felt the patient had significant lymphedema.  When I saw the patient last on 08/14/2022 I recommended that we proceed with obtaining a BioTAB lymphedema pump.  She comes in for a follow-up visit.  Since I saw her last she was able to obtain the BioTAB lymphedema pump.  She has a half leg device which she uses for 1 hour twice a day.  She does sit up while using it however.  She cannot wear compression stockings so she wears tight leggings during the day.  This does help significantly with her swelling.  Past Medical History:  Diagnosis Date   Arthritis    Complication of anesthesia    Took a while to wake up with knee. Avoid pancuronium   Coronary artery disease    MI 2012, 2019, Dr. 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 (states it was a partial)   Headache    migraines in her younger years   Hemorrhoids    High cholesterol    History  of blood transfusion    Hx of adenomatous colonic polyps    Hypertension    Liposarcoma of retroperitoneum (HCC)    Lung nodule    Lymphedema    BLE   Myocardial infarction (HCC)    x2 2012 and 2019   Nocturnal leg cramps    Obesity    Sleep apnea    uses cpap    Family History  Problem Relation Age of Onset   Hypertension Mother    Pancreatic cancer Father    Breast cancer Maternal Grandmother    Prostate cancer Maternal Uncle    Cancer Paternal Aunt        unknown   Cancer Paternal Uncle        unknown   Cancer Maternal Uncle        unknown   Cancer Maternal Uncle        unknown    SOCIAL HISTORY: Social History   Tobacco Use   Smoking status: Never   Smokeless tobacco: Never  Substance Use Topics   Alcohol use: No    Allergies  Allergen Reactions   Codeine Shortness Of Breath and Other (See Comments)    Made back hurt; does not affect liver per patient but has has caused difficulty breathing/tolerates  Percocet only in small doses Back hurts does not affect liver per patient but has has caused difficulty breathing but tolerates Percocet only at small doses   Imodium A-D [Loperamide Hcl] Shortness Of Breath and Other (See Comments)    Caused a pain & "squeezing" sensation in the lungs also   Neomycin Swelling and Other (See Comments)    Reaction to eye ointment - eyes became swollen shut Makes wounds ooze   Penicillinase Hives, Shortness Of Breath and Rash    Has patient had a PCN reaction causing immediate rash, facial/tongue/throat swelling, SOB or lightheadedness with hypotension: Yes Has patient had a PCN reaction causing severe rash involving mucus membranes or skin necrosis: No Has patient had a PCN reaction that required hospitalization:No Has patient had a PCN reaction occurring within the last 10 years: No If all of the above answers are "NO", then may proceed with Cephalosporin use.    Penicillins Hives, Shortness Of Breath and Rash    Reaction @  18-yrs old Has patient had a PCN reaction causing immediate rash, facial/tongue/throat swelling, SOB or lightheadedness with hypotension: Yes Has patient had a PCN reaction causing severe rash involving mucus membranes or skin necrosis: No Has patient had a PCN reaction that required hospitalization: No Has patient had a PCN reaction occurring within the last 10 years: No If all of the above answers are "NO", then may proceed with Cephalosporin use.   Tramadol Shortness Of Breath and Other (See Comments)    (Per patient) "felt like lungs were being squeezed"    Lisinopril Cough   Hornet Venom Swelling    Swelling at site of sting   Adhesive [Tape]     Tears the skin, paper tape is ok   Erythromycin Diarrhea   Hydromorphone Other (See Comments)    Severe Lethargy     Percocet [Oxycodone-Acetaminophen] Other (See Comments)    Confusion     Current Outpatient Medications  Medication Sig Dispense Refill   acetaminophen (TYLENOL) 500 MG tablet Take 500 mg by mouth every 6 (six) hours as needed for mild pain.     aspirin EC 81 MG tablet Take 81 mg by mouth daily at 2 PM.      atorvastatin (LIPITOR) 80 MG tablet Take 1 tablet (80 mg total) by mouth daily. 90 tablet 2   chlorpheniramine (CHLOR-TRIMETON) 4 MG tablet Take 8 mg by mouth at bedtime.      Cholecalciferol (D3 PO) Take 2,000 mg by mouth.     furosemide (LASIX) 40 MG tablet Take 40 mg by mouth daily.     mesalamine (APRISO) 0.375 g 24 hr capsule Take 1,500 mg by mouth daily.     metoprolol tartrate (LOPRESSOR) 12.5 mg TABS tablet Take 12.5 mg by mouth 2 (two) times daily.     Multiple Vitamins-Minerals (MULTI FOR HER 50+) TABS Take 1 tablet by mouth daily.     nitroGLYCERIN (NITROSTAT) 0.4 MG SL tablet Place 1 tablet (0.4 mg total) under the tongue every 5 (five) minutes as needed for chest pain. 25 tablet 5   potassium chloride SA (K-DUR,KLOR-CON) 20 MEQ tablet Take 10 mEq by mouth once.     saccharomyces boulardii (FLORASTOR)  250 MG capsule Take 250 mg by mouth 2 (two) times daily.     UNABLE TO FIND CPAP: At bedtime nightly and during all naps     predniSONE (DELTASONE) 5 MG tablet 1 tablet Orally Once a day (tapering schedule) for 30 days (Patient not taking: Reported  on 01/01/2023)     No current facility-administered medications for this visit.    REVIEW OF SYSTEMS:  [X]  denotes positive finding, [ ]  denotes negative finding Cardiac  Comments:  Chest pain or chest pressure:    Shortness of breath upon exertion:    Short of breath when lying flat:    Irregular heart rhythm:        Vascular    Pain in calf, thigh, or hip brought on by ambulation:    Pain in feet at night that wakes you up from your sleep:     Blood clot in your veins:    Leg swelling:         Pulmonary    Oxygen at home:    Productive cough:     Wheezing:         Neurologic    Sudden weakness in arms or legs:     Sudden numbness in arms or legs:     Sudden onset of difficulty speaking or slurred speech:    Temporary loss of vision in one eye:     Problems with dizziness:         Gastrointestinal    Blood in stool:     Vomited blood:         Genitourinary    Burning when urinating:     Blood in urine:        Psychiatric    Major depression:         Hematologic    Bleeding problems:    Problems with blood clotting too easily:        Skin    Rashes or ulcers:        Constitutional    Fever or chills:     PHYSICAL EXAM:   Vitals:   01/01/23 1448  BP: 132/84  Pulse: 72  Resp: 18  Temp: 98 F (36.7 C)  TempSrc: Temporal  SpO2: 100%  Weight: 219 lb 8 oz (99.6 kg)  Height: 5' (1.524 m)    GENERAL: The patient is a well-nourished female, in no acute distress. The vital signs are documented above. CARDIAC: There is a regular rate and rhythm.  VASCULAR: She has bilateral lower extremity swelling which is nonpitting consistent with lymphedema.  PULMONARY: There is good air exchange bilaterally without wheezing  or rales. MUSCULOSKELETAL: There are no major deformities or cyanosis. NEUROLOGIC: No focal weakness or paresthesias are detected. SKIN: There are no ulcers or rashes noted. PSYCHIATRIC: The patient has a normal affect.  DATA:    No new data.  Waverly Ferrari [facility] [phone]

## 2023-01-03 ENCOUNTER — Emergency Department (HOSPITAL_BASED_OUTPATIENT_CLINIC_OR_DEPARTMENT_OTHER): Payer: Medicare Other | Admitting: Radiology

## 2023-01-03 ENCOUNTER — Encounter (HOSPITAL_BASED_OUTPATIENT_CLINIC_OR_DEPARTMENT_OTHER): Payer: Self-pay

## 2023-01-03 ENCOUNTER — Other Ambulatory Visit: Payer: Self-pay

## 2023-01-03 ENCOUNTER — Emergency Department (HOSPITAL_BASED_OUTPATIENT_CLINIC_OR_DEPARTMENT_OTHER)
Admission: EM | Admit: 2023-01-03 | Discharge: 2023-01-03 | Disposition: A | Discharge status: Home / Self Care | Payer: Medicare Other | Attending: Emergency Medicine | Admitting: Emergency Medicine

## 2023-01-03 DIAGNOSIS — I2510 Atherosclerotic heart disease of native coronary artery without angina pectoris: Secondary | ICD-10-CM | POA: Insufficient documentation

## 2023-01-03 DIAGNOSIS — M7071 Other bursitis of hip, right hip: Secondary | ICD-10-CM

## 2023-01-03 DIAGNOSIS — Y9389 Activity, other specified: Secondary | ICD-10-CM | POA: Insufficient documentation

## 2023-01-03 DIAGNOSIS — Z79899 Other long term (current) drug therapy: Secondary | ICD-10-CM | POA: Diagnosis not present

## 2023-01-03 DIAGNOSIS — I10 Essential (primary) hypertension: Secondary | ICD-10-CM | POA: Insufficient documentation

## 2023-01-03 DIAGNOSIS — M7061 Trochanteric bursitis, right hip: Secondary | ICD-10-CM | POA: Diagnosis not present

## 2023-01-03 DIAGNOSIS — M25551 Pain in right hip: Secondary | ICD-10-CM | POA: Diagnosis not present

## 2023-01-03 DIAGNOSIS — M8580 Other specified disorders of bone density and structure, unspecified site: Secondary | ICD-10-CM | POA: Diagnosis not present

## 2023-01-03 DIAGNOSIS — Z981 Arthrodesis status: Secondary | ICD-10-CM | POA: Diagnosis not present

## 2023-01-03 DIAGNOSIS — Z7982 Long term (current) use of aspirin: Secondary | ICD-10-CM | POA: Diagnosis not present

## 2023-01-03 DIAGNOSIS — M160 Bilateral primary osteoarthritis of hip: Secondary | ICD-10-CM | POA: Diagnosis not present

## 2023-01-03 MED ORDER — PREDNISONE 20 MG PO TABS: 20.0000 mg | ORAL_TABLET | Freq: Every day | ORAL | 0 refills | Status: DC

## 2023-01-03 NOTE — ED Provider Notes (Signed)
[facility] EMERGENCY DEPARTMENT AT [facility] Provider Note   CSN: [id number] Arrival date & time: 01/03/23  1748     History  Chief Complaint  Patient presents with   Hip Pain    Right hip    Isabella [name] is a 82 y.o. female.  Patient is an 82 year old female with a history of CAD, hypertension, ulcerative colitis who is currently waiting to go on [employer]olog[employer] who is presenting today with worsening pain in the right hip.  She started noticing it on Monday and reports it is the worst when she flexes her hip when she is trying to lift her leg.  She has still been able to walk and has some mild pain with weightbearing but the worst is when she has to lift her leg to get into a car or stand up from the toilet.  She has not had any redness or swelling of the hip.  Pain does not radiate down to the lower part of her foot.  She is also been having ongoing issues with her lymphedema bilaterally and her legs have significant swelling.  Is had no skin changes or color changes in the legs.  Denies any falls or known trauma.  The history is provided by the patient and a relative.  Hip Pain       Home Medications Prior to Admission medications   Medication Sig Start Date End Date Taking? Authorizing Provider  predniSONE (DELTASONE) 20 MG tablet Take 1 tablet (20 mg total) by mouth daily. 01/03/23  Yes Gwyneth Sprout, MD  acetaminophen (TYLENOL) 500 MG tablet Take 500 mg by mouth every 6 (six) hours as needed for mild pain.    [provider]  aspirin EC 81 MG tablet Take 81 mg by mouth daily at 2 PM.     [provider]  atorvastatin (LIPITOR) 80 MG tablet Take 1 tablet (80 mg total) by mouth daily. 05/14/22   Lyn Records, MD  chlorpheniramine (CHLOR-TRIMETON) 4 MG tablet Take 8 mg by mouth at bedtime.     [provider]  Cholecalciferol (D3 PO) Take 2,000 mg by mouth.    [provider]  furosemide (LASIX) 40 MG tablet Take 40 mg by mouth  daily.    [provider]  mesalamine (APRISO) 0.375 g 24 hr capsule Take 1,500 mg by mouth daily.    [provider]  metoprolol tartrate (LOPRESSOR) 12.5 mg TABS tablet Take 12.5 mg by mouth 2 (two) times daily.    [provider]  Multiple Vitamins-Minerals (MULTI FOR HER 50+) TABS Take 1 tablet by mouth daily.    [provider]  nitroGLYCERIN (NITROSTAT) 0.4 MG SL tablet Place 1 tablet (0.4 mg total) under the tongue every 5 (five) minutes as needed for chest pain. 10/04/21   Lyn Records, MD  potassium chloride SA (K-DUR,KLOR-CON) 20 MEQ tablet Take 10 mEq by mouth once.    [provider]  predniSONE (DELTASONE) 5 MG tablet 1 tablet Orally Once a day (tapering schedule) for 30 days Patient not taking: Reported on 01/01/2023 08/15/22   [provider]  saccharomyces boulardii (FLORASTOR) 250 MG capsule Take 250 mg by mouth 2 (two) times daily.    [provider]  UNABLE TO FIND CPAP: At bedtime nightly and during all naps    [provider]      Allergies    Codeine, Imodium a-d [loperamide hcl], Neomycin, Penicillinase, Penicillins, Tramadol, Lisinopril, Hornet venom, Adhesive [tape], Erythromycin,  Hydromorphone, and Percocet [oxycodone-acetaminophen]    Review of Systems   Review of Systems  Physical Exam Updated Vital Signs BP (!) 222/97   Pulse 70   Temp (!) 97.2 F (36.2 C) (Oral)   Resp 16   Ht 5\' 1"  (1.549 m)   Wt 98.9 kg   SpO2 100%   BMI 41.19 kg/m  Physical Exam Vitals and nursing note reviewed.  Constitutional:      General: She is not in acute distress.    Appearance: She is well-developed.  HENT:     Head: Normocephalic and atraumatic.  Eyes:     Pupils: Pupils are equal, round, and reactive to light.  Cardiovascular:     Rate and Rhythm: Normal rate and regular rhythm.     Pulses: Normal pulses.     Heart sounds: Normal heart sounds. No murmur heard.    No friction rub.  Pulmonary:      Effort: Pulmonary effort is normal.     Breath sounds: Normal breath sounds. No wheezing or rales.  Abdominal:     General: Bowel sounds are normal. There is no distension.     Palpations: Abdomen is soft.     Tenderness: There is no abdominal tenderness. There is no guarding or rebound.  Musculoskeletal:        General: Tenderness present. Normal range of motion.     Right hip: Tenderness present.     Comments: Significant pitting edema in bilateral lower extremities from the knee down.  1+ DP pulse present in both lower extremities.  Sensation is intact and movement is intact without evidence of weakness.  With passive range of motion of the right hip able to flex and externally rotate with minimal pain however internal rotation and extension seems to cause the most discomfort.  However when patient actively flexes her hip she has significant pain.  There is no erythema or warmth to the joint.  Significant tenderness with lateral compression to the bursa  Skin:    General: Skin is warm and dry.     Findings: No rash.  Neurological:     Mental Status: She is alert and oriented to person, place, and time.     Cranial Nerves: No cranial nerve deficit.  Psychiatric:        Behavior: Behavior normal.     ED Results / Procedures / Treatments   Labs (all labs ordered are listed, but only abnormal results are displayed) Labs Reviewed - No data to display  EKG None  Radiology DG Hip Unilat  With Pelvis 2-3 Views Right  Result Date: 01/03/2023 CLINICAL DATA:  Right hip pain. EXAM: DG HIP (WITH OR WITHOUT PELVIS) 2-3V RIGHT COMPARISON:  CT dated 01/27/2018. FINDINGS: There is no acute fracture or dislocation. The bones are osteopenic. Mild bilateral hip arthritic changes. Partially visualized lower lumbar spinal fusion hardware. A hernia repair mesh is seen. The soft tissues are otherwise unremarkable. IMPRESSION: 1. No acute fracture or dislocation. 2. Mild bilateral hip arthritic changes.  Electronically Signed   By: Elgie Collard M.D.   On: 01/03/2023 19:30    Procedures Procedures    Medications Ordered in ED Medications - No data to display  ED Course/ Medical Decision Making/ A&P                                 Medical Decision Making Amount and/or Complexity of Data Reviewed Radiology: ordered  and independent interpretation performed. Decision-making details documented in ED Course.  Risk Prescription drug management.   Patient here with ongoing right hip pain which seems most classic for bursitis.  Denies any traumas.  No evidence of a septic joint.  Low suspicion for DVT or vascular cause.  Given patient's exam lower suspicion that this is related to her back.  I have independently visualized and interpreted pt's images today.  Hip imaging today without evidence of fracture and low suspicion for an occult fracture.  Did show some arthritis.  This was discussed with the patient and her daughter.  Patient has multiple limitations on the medication she can have due to her ulcerative colitis and she only has 1 kidney.  She cannot have any NSAIDs.  They are trying to limit her prednisone as she had gone off of the long-term prednisone dosage about a month ago.  Findings discussed with the patient.  She was given a short course of prednisone to use if Tylenol, Voltaren gel and heat did not help.  Also recommended following up with orthopedics as patient may need an injection.         Final Clinical Impression(s) / ED Diagnoses Final diagnoses:  Bursitis of other bursa of right hip    Rx / DC Orders ED Discharge Orders          Ordered    predniSONE (DELTASONE) 20 MG tablet  Daily        01/03/23 2010              Gwyneth Sprout, MD 01/03/23 2022

## 2023-01-03 NOTE — Discharge Instructions (Signed)
X-ray shows some mild arthritis but no other changes.  You most likely have bursitis.  You can try Voltaren gel, 2 extra strength Tylenol 3 times a day and a heating pad might also be helpful.  If that does not help you can do a few days of prednisone and a prescription was sent to your pharmacy.

## 2023-01-03 NOTE — ED Notes (Signed)
Pt assisted by 2 staff members into bed. No shortening/ rotation noted in RLE

## 2023-01-03 NOTE — ED Triage Notes (Signed)
Pt began having right hip pain with movement since Monday. Pain is sharp and has been getting worse. Pt is able to ambulate but with difficulty. Denies falls.

## 2023-02-03 DIAGNOSIS — K5100 Ulcerative (chronic) pancolitis without complications: Secondary | ICD-10-CM | POA: Diagnosis not present

## 2023-02-06 DIAGNOSIS — K5100 Ulcerative (chronic) pancolitis without complications: Secondary | ICD-10-CM | POA: Diagnosis not present

## 2023-02-13 DIAGNOSIS — K5100 Ulcerative (chronic) pancolitis without complications: Secondary | ICD-10-CM | POA: Diagnosis not present

## 2023-03-07 ENCOUNTER — Encounter: Payer: Self-pay | Admitting: Cardiovascular Disease

## 2023-03-07 ENCOUNTER — Ambulatory Visit: Payer: Medicare Other | Attending: Cardiovascular Disease | Admitting: Cardiovascular Disease

## 2023-03-07 VITALS — BP 140/100 | HR 81 | Ht 61.0 in | Wt 213.0 lb

## 2023-03-07 DIAGNOSIS — E785 Hyperlipidemia, unspecified: Secondary | ICD-10-CM | POA: Diagnosis not present

## 2023-03-07 DIAGNOSIS — I10 Essential (primary) hypertension: Secondary | ICD-10-CM

## 2023-03-07 DIAGNOSIS — I2510 Atherosclerotic heart disease of native coronary artery without angina pectoris: Secondary | ICD-10-CM | POA: Diagnosis not present

## 2023-03-07 DIAGNOSIS — I059 Rheumatic mitral valve disease, unspecified: Secondary | ICD-10-CM

## 2023-03-07 NOTE — Progress Notes (Signed)
Chief Complaint  Patient presents with   Follow-up    CAD   History of Present Illness: 82 yo female with history of CAD, GERD, hyperlipidemia, HTN, DVT, chronic diastolic CHF, ulcerative colitis, pulmonary sarcoidosis and sleep apnea who is here today for cardiac follow up. She has been followed by Dr. Katrinka Blazing. 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 There was catheter induced dissection of the proximal vessel treated with 2 drug eluting stents. Last cardiac cath in in 2019 with patent RCA stents and healed area of SCAD in the RCA. The first obtuse marginal branch had spontaneous dissection with TIMI-3 flow. Echo September 2019 with LVEF=60-65%. Mild MR.   She is here today for follow up. The patient denies any chest pain, dyspnea, palpitations, lower extremity edema, orthopnea, PND, near syncope or syncope. One episode of dizziness when turning her head rapidly.   Her daughter is Janiya Kingen one of our echo techs.   Primary Care Physician: Shon Hale, MD   Past Medical History:  Diagnosis Date   Arthritis    Complication of anesthesia    Took a while to wake up with knee. Avoid pancuronium   Coronary artery disease    MI 2012, 2019, Dr. 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 (states it was a partial)   Headache    migraines in her younger years   Hemorrhoids    High cholesterol    History of blood transfusion    Hx of adenomatous colonic polyps    Hypertension    Liposarcoma of retroperitoneum (HCC)    Lung nodule    Lymphedema    BLE   Myocardial infarction (HCC)    x2 2012 and 2019   Nocturnal leg cramps    Obesity    Sleep apnea    uses cpap    Past Surgical History:  Procedure Laterality Date   ABDOMINAL HYSTERECTOMY  1990   APPENDECTOMY  1970    BACK SURGERY  2015   lower back   CARDIAC CATHETERIZATION  03/2011   CARPAL TUNNEL RELEASE Bilateral 2004   CATARACT EXTRACTION     CHOLECYSTECTOMY  1970   COLONOSCOPY W/ BIOPSIES AND POLYPECTOMY     CORONARY ANGIOPLASTY  2012   CORONARY STENT PLACEMENT  03/24/2011   Dr. Katrinka Blazing   EYE SURGERY  [phone]   corneal transplant   FEMORAL ARTERY EXPLORATION  03/25/2011   Procedure: FEMORAL ARTERY EXPLORATION;  Surgeon: Sherren Kerns, MD;  Location: [facility] OR;  Service: Vascular;  Laterality: Right;  Evacuation of Hematoma    hematoma surgery     from cardiac cath -hematoma in groin-right   HERNIA REPAIR  2007   umbilical   JOINT REPLACEMENT     KNEE ARTHROSCOPY  2006   Right   Laser Surgery Left Eye  2012   LEFT HEART CATH AND CORONARY ANGIOGRAPHY N/A 01/27/2018   Procedure: LEFT HEART CATH AND CORONARY ANGIOGRAPHY;  Surgeon: Lyn Records, MD;  Location: [facility] INVASIVE CV LAB;  Service: Cardiovascular;  Laterality: N/A;   LEFT HEART CATHETERIZATION WITH CORONARY ANGIOGRAM N/A 03/24/2011   Procedure: LEFT HEART CATHETERIZATION WITH CORONARY ANGIOGRAM;  Surgeon: Lesleigh Noe, MD;  Location: [facility]  CATH LAB;  Service: Cardiovascular;  Laterality: N/A;   NEPHRECTOMY Left 04/24/2020   Procedure: OPEN RADICAL NEPHRECTOMY WITH RESECTION OF PERITONEAL MASS;  Surgeon: Heloise Purpura, MD;  Location: [facility] ORS;  Service: Urology;  Laterality: Left;   PERCUTANEOUS CORONARY STENT INTERVENTION (PCI-S) N/A 03/24/2011   Procedure: PERCUTANEOUS CORONARY STENT INTERVENTION (PCI-S);  Surgeon: Lesleigh Noe, MD;  Location: [facility] CATH LAB;  Service: Cardiovascular;  Laterality: N/A;   SHOULDER SURGERY  2010   TONSILLECTOMY  1949   TONSILLECTOMY AND ADENOIDECTOMY     TOTAL KNEE ARTHROPLASTY  07/2010   Left   TOTAL KNEE ARTHROPLASTY  06/17/2012   Procedure: TOTAL KNEE ARTHROPLASTY;  Surgeon: Loanne Drilling, MD;  Location: [facility] ORS;  Service: Orthopedics;  Laterality: Right;   TUBAL LIGATION  1978   UMBILICAL  HERNIA REPAIR  1990, 2007   x 2   VIDEO BRONCHOSCOPY WITH ENDOBRONCHIAL NAVIGATION  04/13/2012   Procedure: VIDEO BRONCHOSCOPY WITH ENDOBRONCHIAL NAVIGATION;  Surgeon: Loreli Slot, MD;  Location: [facility] OR;  Service: Thoracic;  Laterality: N/A;   VIDEO BRONCHOSCOPY WITH ENDOBRONCHIAL NAVIGATION N/A 04/08/2016   Procedure: VIDEO BRONCHOSCOPY WITH ENDOBRONCHIAL NAVIGATION;  Surgeon: Oretha Milch, MD;  Location: [facility] OR;  Service: Thoracic;  Laterality: N/A;    Current Outpatient Medications  Medication Sig Dispense Refill   acetaminophen (TYLENOL) 500 MG tablet Take 500 mg by mouth every 6 (six) hours as needed for mild pain.     aspirin EC 81 MG tablet Take 81 mg by mouth daily at 2 PM.      atorvastatin (LIPITOR) 80 MG tablet Take 1 tablet (80 mg total) by mouth daily. 90 tablet 2   chlorpheniramine (CHLOR-TRIMETON) 4 MG tablet Take 8 mg by mouth at bedtime.      Cholecalciferol (D3 PO) Take 2,000 mg by mouth.     furosemide (LASIX) 40 MG tablet Take 40 mg by mouth daily.     mesalamine (APRISO) 0.375 g 24 hr capsule Take 1,500 mg by mouth daily.     metoprolol tartrate (LOPRESSOR) 12.5 mg TABS tablet Take 12.5 mg by mouth 2 (two) times daily.     Multiple Vitamins-Minerals (MULTI FOR HER 50+) TABS Take 1 tablet by mouth daily.     nitroGLYCERIN (NITROSTAT) 0.4 MG SL tablet Place 1 tablet (0.4 mg total) under the tongue every 5 (five) minutes as needed for chest pain. 25 tablet 5   potassium chloride SA (K-DUR,KLOR-CON) 20 MEQ tablet Take 10 mEq by mouth once.     predniSONE (DELTASONE) 5 MG tablet      REMICADE 100 MG injection Inject 100 mg into the vein as directed.     saccharomyces boulardii (FLORASTOR) 250 MG capsule Take 250 mg by mouth 2 (two) times daily.     UNABLE TO FIND CPAP: At bedtime nightly and during all naps     No current facility-administered medications for this visit.    Allergies  Allergen Reactions   Codeine Shortness Of Breath and Other (See Comments)     Made back hurt; does not affect liver per patient but has has caused difficulty breathing/tolerates Percocet only in small doses Back hurts does not affect liver per patient but has has caused difficulty breathing but tolerates Percocet only at small doses   Imodium A-D [Loperamide Hcl] Shortness Of Breath and Other (See Comments)    Caused a pain & "squeezing" sensation in the lungs also   Neomycin Swelling and Other (See Comments)  Reaction to eye ointment - eyes became swollen shut Makes wounds ooze   Penicillinase Hives, Shortness Of Breath and Rash    Has patient had a PCN reaction causing immediate rash, facial/tongue/throat swelling, SOB or lightheadedness with hypotension: Yes Has patient had a PCN reaction causing severe rash involving mucus membranes or skin necrosis: No Has patient had a PCN reaction that required hospitalization:No Has patient had a PCN reaction occurring within the last 10 years: No If all of the above answers are "NO", then may proceed with Cephalosporin use.    Penicillins Hives, Shortness Of Breath and Rash    Reaction @ 18-yrs old Has patient had a PCN reaction causing immediate rash, facial/tongue/throat swelling, SOB or lightheadedness with hypotension: Yes Has patient had a PCN reaction causing severe rash involving mucus membranes or skin necrosis: No Has patient had a PCN reaction that required hospitalization: No Has patient had a PCN reaction occurring within the last 10 years: No If all of the above answers are "NO", then may proceed with Cephalosporin use.   Tramadol Shortness Of Breath and Other (See Comments)    (Per patient) "felt like lungs were being squeezed"    Lisinopril Cough   Hornet Venom Swelling    Swelling at site of sting   Adhesive [Tape]     Tears the skin, paper tape is ok   Erythromycin Diarrhea   Hydromorphone Other (See Comments)    Severe Lethargy     Percocet [Oxycodone-Acetaminophen] Other (See Comments)     Confusion     Social History   Socioeconomic History   Marital status: Widowed    Spouse name: Not on file   Number of children: Not on file   Years of education: Not on file   Highest education level: Not on file  Occupational History   Occupation: Retired    Associate Professor: RETIRED  Tobacco Use   Smoking status: Never   Smokeless tobacco: Never  Vaping Use   Vaping status: Never Used  Substance and Sexual Activity   Alcohol use: No   Drug use: No   Sexual activity: Not Currently    Birth control/protection: Post-menopausal, Surgical    Comment: Hysterectomy  Other Topics Concern   Not on file  Social History Narrative   Tobacco use cigarettes: never smoked, tobacco history last updated 12/23/2013. No smoking. No alcohol. Caffeine: yes, coffee, 3 servings daily.recreational drug use: never. No diet. Exercise: walks everyday. Occupation: retired. Marital status: married. Children: boys, 2 girls, 2. Seat belt use: yes.   Social Determinants of Health   Financial Resource Strain: Not on file  Food Insecurity: Not on file  Transportation Needs: Not on file  Physical Activity: Not on file  Stress: Not on file  Social Connections: Not on file  Intimate Partner Violence: Not on file    Family History  Problem Relation Age of Onset   Hypertension Mother    Pancreatic cancer Father    Breast cancer Maternal Grandmother    Prostate cancer Maternal Uncle    Cancer Paternal Aunt        unknown   Cancer Paternal Uncle        unknown   Cancer Maternal Uncle        unknown   Cancer Maternal Uncle        unknown    Review of Systems:  As stated in the HPI and otherwise negative.   BP (!) 140/100   Pulse 81   Ht 5'  1" (1.549 m)   Wt 96.6 kg   SpO2 98%   BMI 40.25 kg/m   Physical Examination: General: Well developed, well nourished, NAD  HEENT: OP clear, mucus membranes moist  SKIN: warm, dry. No rashes. Neuro: No focal deficits  Musculoskeletal: Muscle strength 5/5  all ext  Psychiatric: Mood and affect normal  Neck: No JVD, no carotid bruits, no thyromegaly, no lymphadenopathy.  Lungs:Clear bilaterally, no wheezes, rhonci, crackles Cardiovascular: Regular rate and rhythm. No murmurs, gallops or rubs. Abdomen:Soft. Bowel sounds present. Non-tender.  Extremities: No lower extremity edema. Pulses are 2 + in the bilateral DP/PT.  EKG:  EKG is ordered today. The ekg ordered today demonstrates  EKG Interpretation Date/Time:  Friday March 07 2023 16:10:18 EDT Ventricular Rate:  84 PR Interval:  202 QRS Duration:  82 QT Interval:  398 QTC Calculation: 470 R Axis:   -42  Text Interpretation: Normal sinus rhythm Possible Left atrial enlargement Left axis deviation Left ventricular hypertrophy ( R in aVL , Cornell product , Romhilt-Estes ) Confirmed by Verne Carrow [phone]) on 03/07/2023 4:14:34 PM    Recent Labs: 03/08/2022: ALT 16; BUN 17; Creatinine, Ser 1.07; Hemoglobin 13.0; Platelets 224; Potassium 4.4; Sodium 140   Lipid Panel    Component Value Date/Time   CHOL 164 01/09/2022 1543   TRIG 93 01/09/2022 1543   HDL 71 01/09/2022 1543   CHOLHDL 2.3 01/09/2022 1543   CHOLHDL 1.9 01/27/2018 0806   VLDL 7 01/27/2018 0806   LDLCALC 76 01/09/2022 1543     Wt Readings from Last 3 Encounters:  03/07/23 96.6 kg  01/03/23 98.9 kg  01/01/23 99.6 kg    Assessment and Plan:   1. CAD without angina: No chest pain suggestive of angina. Continue ASA, statin and beta blocker.   2. HTN: BP has been controlled at home. Continue metoprolol.   3. Hyperlipidemia: Lipids followed in primary care. Continue statin.   4. Sleep apnea: She wears CPAP.   5. Mitral regurgitation: Mild by echo in 2019. Repeat echo now.   Labs/ tests ordered today include:   Orders Placed This Encounter  Procedures   EKG 12-Lead   ECHOCARDIOGRAM COMPLETE   Disposition:   F/U with me in one year   Signed, Verne Carrow, MD, [facility] 03/07/2023 4:40 PM     [facility] [address] [place], [place], [place]  [address] Phone: [phone]; Fax: [phone]

## 2023-03-07 NOTE — Patient Instructions (Signed)
Medication Instructions:  Your physician recommends that you continue on your current medications as directed. Please refer to the Current Medication list given to you today.  *If you need a refill on your cardiac medications before your next appointment, please call your pharmacy*   Lab Work: None ordered   If you have labs (blood work) drawn today and your tests are completely normal, you will receive your results only by: MyChart Message (if you have MyChart) OR A paper copy in the mail If you have any lab test that is abnormal or we need to change your treatment, we will call you to review the results.   Testing/Procedures: Your physician has requested that you have an echocardiogram. Echocardiography is a painless test that uses sound waves to create images of your heart. It provides your doctor with information about the size and shape of your heart and how well your heart's chambers and valves are working. This procedure takes approximately one hour. There are no restrictions for this procedure. Please do NOT wear cologne, perfume, aftershave, or lotions (deodorant is allowed). Please arrive 15 minutes prior to your appointment time.    Follow-Up: At [facility], you and your health needs are our priority.  As part of our continuing mission to provide you with exceptional heart care, we have created designated Provider Care Teams.  These Care Teams include your primary Cardiologist (physician) and Advanced Practice Providers (APPs -  Physician Assistants and Nurse Practitioners) who all work together to provide you with the care you need, when you need it.  We recommend signing up for the patient portal called "MyChart".  Sign up information is provided on this After Visit Summary.  MyChart is used to connect with patients for Virtual Visits (Telemedicine).  Patients are able to view lab/test results, encounter notes, upcoming appointments, etc.  Non-urgent messages can be  sent to your provider as well.   To learn more about what you can do with MyChart, go to [email or web address].    Your next appointment:   12 month(s)  Provider:   Dr. Verne Carrow   Other Instructions

## 2023-03-13 ENCOUNTER — Other Ambulatory Visit: Payer: Self-pay | Admitting: Urology

## 2023-03-13 DIAGNOSIS — D49512 Neoplasm of unspecified behavior of left kidney: Secondary | ICD-10-CM

## 2023-03-21 DIAGNOSIS — D84821 Immunodeficiency due to drugs: Secondary | ICD-10-CM | POA: Diagnosis not present

## 2023-03-21 DIAGNOSIS — I10 Essential (primary) hypertension: Secondary | ICD-10-CM | POA: Diagnosis not present

## 2023-03-21 DIAGNOSIS — I700 Atherosclerosis of aorta: Secondary | ICD-10-CM | POA: Diagnosis not present

## 2023-03-21 DIAGNOSIS — Z79899 Other long term (current) drug therapy: Secondary | ICD-10-CM | POA: Diagnosis not present

## 2023-03-21 DIAGNOSIS — E559 Vitamin D deficiency, unspecified: Secondary | ICD-10-CM | POA: Diagnosis not present

## 2023-03-21 DIAGNOSIS — Z905 Acquired absence of kidney: Secondary | ICD-10-CM | POA: Diagnosis not present

## 2023-03-21 DIAGNOSIS — N3281 Overactive bladder: Secondary | ICD-10-CM | POA: Diagnosis not present

## 2023-03-21 DIAGNOSIS — D500 Iron deficiency anemia secondary to blood loss (chronic): Secondary | ICD-10-CM | POA: Diagnosis not present

## 2023-03-21 DIAGNOSIS — E7800 Pure hypercholesterolemia, unspecified: Secondary | ICD-10-CM | POA: Diagnosis not present

## 2023-03-21 DIAGNOSIS — K51019 Ulcerative (chronic) pancolitis with unspecified complications: Secondary | ICD-10-CM | POA: Diagnosis not present

## 2023-03-21 DIAGNOSIS — K51011 Ulcerative (chronic) pancolitis with rectal bleeding: Secondary | ICD-10-CM | POA: Diagnosis not present

## 2023-03-21 DIAGNOSIS — Z0000 Encounter for general adult medical examination without abnormal findings: Secondary | ICD-10-CM | POA: Diagnosis not present

## 2023-04-04 ENCOUNTER — Ambulatory Visit (HOSPITAL_COMMUNITY): Payer: Medicare Other | Attending: Cardiovascular Disease

## 2023-04-04 DIAGNOSIS — I059 Rheumatic mitral valve disease, unspecified: Secondary | ICD-10-CM | POA: Insufficient documentation

## 2023-04-04 LAB — ECHOCARDIOGRAM COMPLETE
Area-P 1/2: 2.76 cm2
S' Lateral: 3 cm

## 2023-04-14 DIAGNOSIS — R42 Dizziness and giddiness: Secondary | ICD-10-CM | POA: Diagnosis not present

## 2023-04-21 ENCOUNTER — Ambulatory Visit
Admission: RE | Admit: 2023-04-21 | Discharge: 2023-04-21 | Disposition: A | Discharge status: Home / Self Care | Payer: Medicare Other | Source: Ambulatory Visit | Attending: Urology | Admitting: Urology

## 2023-04-21 DIAGNOSIS — N281 Cyst of kidney, acquired: Secondary | ICD-10-CM | POA: Diagnosis not present

## 2023-04-21 DIAGNOSIS — Z85528 Personal history of other malignant neoplasm of kidney: Secondary | ICD-10-CM | POA: Diagnosis not present

## 2023-04-21 DIAGNOSIS — Z905 Acquired absence of kidney: Secondary | ICD-10-CM | POA: Diagnosis not present

## 2023-04-21 DIAGNOSIS — D49512 Neoplasm of unspecified behavior of left kidney: Secondary | ICD-10-CM

## 2023-04-21 DIAGNOSIS — K862 Cyst of pancreas: Secondary | ICD-10-CM | POA: Diagnosis not present

## 2023-04-21 MED ORDER — GADOPICLENOL 0.5 MMOL/ML IV SOLN
10.0000 mL | Freq: Once | INTRAVENOUS | Status: AC | PRN
  Administered 2023-04-21: 10 mL via INTRAVENOUS

## 2023-04-24 DIAGNOSIS — D49512 Neoplasm of unspecified behavior of left kidney: Secondary | ICD-10-CM | POA: Diagnosis not present

## 2023-04-24 DIAGNOSIS — E041 Nontoxic single thyroid nodule: Secondary | ICD-10-CM | POA: Diagnosis not present

## 2023-04-24 DIAGNOSIS — R918 Other nonspecific abnormal finding of lung field: Secondary | ICD-10-CM | POA: Diagnosis not present

## 2023-04-25 DIAGNOSIS — D49512 Neoplasm of unspecified behavior of left kidney: Secondary | ICD-10-CM | POA: Diagnosis not present

## 2023-05-16 DIAGNOSIS — K51011 Ulcerative (chronic) pancolitis with rectal bleeding: Secondary | ICD-10-CM | POA: Diagnosis not present

## 2023-05-21 DIAGNOSIS — M25511 Pain in right shoulder: Secondary | ICD-10-CM | POA: Diagnosis not present

## 2023-05-21 DIAGNOSIS — M19011 Primary osteoarthritis, right shoulder: Secondary | ICD-10-CM | POA: Diagnosis not present

## 2023-05-21 DIAGNOSIS — M19012 Primary osteoarthritis, left shoulder: Secondary | ICD-10-CM | POA: Diagnosis not present

## 2023-05-21 DIAGNOSIS — M25512 Pain in left shoulder: Secondary | ICD-10-CM | POA: Diagnosis not present

## 2023-05-26 ENCOUNTER — Other Ambulatory Visit (HOSPITAL_COMMUNITY): Payer: Self-pay | Admitting: Family Medicine

## 2023-05-26 ENCOUNTER — Ambulatory Visit (HOSPITAL_COMMUNITY)
Admission: RE | Admit: 2023-05-26 | Discharge: 2023-05-26 | Disposition: A | Discharge status: Home / Self Care | Payer: Medicare Other | Source: Ambulatory Visit | Attending: Surgery | Admitting: Surgery

## 2023-05-26 DIAGNOSIS — M79604 Pain in right leg: Secondary | ICD-10-CM | POA: Insufficient documentation

## 2023-05-26 DIAGNOSIS — M7989 Other specified soft tissue disorders: Secondary | ICD-10-CM | POA: Insufficient documentation

## 2023-05-26 DIAGNOSIS — K51019 Ulcerative (chronic) pancolitis with unspecified complications: Secondary | ICD-10-CM | POA: Diagnosis not present

## 2023-05-26 DIAGNOSIS — N1832 Chronic kidney disease, stage 3b: Secondary | ICD-10-CM | POA: Diagnosis not present

## 2023-05-26 DIAGNOSIS — I5032 Chronic diastolic (congestive) heart failure: Secondary | ICD-10-CM | POA: Diagnosis not present

## 2023-05-26 DIAGNOSIS — R2241 Localized swelling, mass and lump, right lower limb: Secondary | ICD-10-CM | POA: Diagnosis not present

## 2023-05-26 DIAGNOSIS — L0390 Cellulitis, unspecified: Secondary | ICD-10-CM | POA: Diagnosis not present

## 2023-06-04 ENCOUNTER — Telehealth: Payer: Self-pay | Admitting: Pulmonary Disease

## 2023-06-04 DIAGNOSIS — G4733 Obstructive sleep apnea (adult) (pediatric): Secondary | ICD-10-CM

## 2023-06-04 NOTE — Telephone Encounter (Signed)
Patient states CPAP machine has stopped working. Patient uses [facility]. Patient phone number is [phone].

## 2023-06-05 NOTE — Telephone Encounter (Signed)
Spoke with patient. Machines states that it has reached its limit. I have placed order for new machine.

## 2023-06-17 DIAGNOSIS — M19012 Primary osteoarthritis, left shoulder: Secondary | ICD-10-CM | POA: Diagnosis not present

## 2023-06-17 DIAGNOSIS — M19011 Primary osteoarthritis, right shoulder: Secondary | ICD-10-CM | POA: Diagnosis not present

## 2023-07-04 ENCOUNTER — Telehealth: Payer: Self-pay | Admitting: Pulmonary Disease

## 2023-07-04 NOTE — Telephone Encounter (Signed)
Suzette Battiest states needs order for CPAP machine. Needs office notes and sleep study. Veronica phone number is [phone]. Fax number is [phone].

## 2023-07-07 DIAGNOSIS — Q6102 Congenital multiple renal cysts: Secondary | ICD-10-CM | POA: Diagnosis not present

## 2023-07-07 DIAGNOSIS — I2510 Atherosclerotic heart disease of native coronary artery without angina pectoris: Secondary | ICD-10-CM | POA: Diagnosis not present

## 2023-07-07 DIAGNOSIS — Z905 Acquired absence of kidney: Secondary | ICD-10-CM | POA: Diagnosis not present

## 2023-07-07 DIAGNOSIS — I129 Hypertensive chronic kidney disease with stage 1 through stage 4 chronic kidney disease, or unspecified chronic kidney disease: Secondary | ICD-10-CM | POA: Diagnosis not present

## 2023-07-07 DIAGNOSIS — E785 Hyperlipidemia, unspecified: Secondary | ICD-10-CM | POA: Diagnosis not present

## 2023-07-07 DIAGNOSIS — D869 Sarcoidosis, unspecified: Secondary | ICD-10-CM | POA: Diagnosis not present

## 2023-07-07 DIAGNOSIS — N1832 Chronic kidney disease, stage 3b: Secondary | ICD-10-CM | POA: Diagnosis not present

## 2023-07-07 NOTE — Telephone Encounter (Signed)
 Called the patient and informed them that the order was sent to [facility]. Provided the patient with [facility]'s contact number in case they do not receive the CPAP by Thursday. NFN

## 2023-07-15 DIAGNOSIS — K5100 Ulcerative (chronic) pancolitis without complications: Secondary | ICD-10-CM | POA: Diagnosis not present

## 2023-07-30 ENCOUNTER — Telehealth: Payer: Self-pay | Admitting: Cardiovascular Disease

## 2023-07-30 NOTE — Telephone Encounter (Signed)
*  STAT* If patient is at the pharmacy, call can be transferred to refill team.   1. Which medications need to be refilled? (please list name of each medication and dose if known)   atorvastatin (LIPITOR) 80 MG tablet   2. Would you like to learn more about the convenience, safety, & potential cost savings by using the [facility]?   3. Are you open to using the [facility] (Type [facility]. ).  4. Which pharmacy/location (including street and city if local pharmacy) is medication to be sent to?  [id number] - [place], [place] - [address] AT [facility] OF [address]   5. Do they need a 30 day or 90 day supply? 90 day  Patient stated she only has 5 tablets left.

## 2023-07-31 MED ORDER — ATORVASTATIN CALCIUM 80 MG PO TABS: 80.0000 mg | ORAL_TABLET | Freq: Every day | ORAL | 2 refills | Status: DC

## 2023-07-31 NOTE — Telephone Encounter (Signed)
 Pt's medication was sent to pt's pharmacy as requested. Confirmation received.

## 2023-08-06 DIAGNOSIS — K51011 Ulcerative (chronic) pancolitis with rectal bleeding: Secondary | ICD-10-CM | POA: Diagnosis not present

## 2023-08-14 DIAGNOSIS — M25512 Pain in left shoulder: Secondary | ICD-10-CM | POA: Diagnosis not present

## 2023-08-14 DIAGNOSIS — M25511 Pain in right shoulder: Secondary | ICD-10-CM | POA: Diagnosis not present

## 2023-08-15 DIAGNOSIS — I890 Lymphedema, not elsewhere classified: Secondary | ICD-10-CM | POA: Diagnosis not present

## 2023-08-15 DIAGNOSIS — I10 Essential (primary) hypertension: Secondary | ICD-10-CM | POA: Diagnosis not present

## 2023-08-25 ENCOUNTER — Telehealth: Payer: Self-pay | Admitting: Pulmonary Disease

## 2023-08-25 NOTE — Telephone Encounter (Signed)
 Spoke with Brad on the Cpap order. They sent the order to [facility]. [employer] called the pt on 06/12/23. Stating that they need the patient to have a new office visit before they can process it. The line was static. They needed an updated Face to face OV note that states the benefits of the CPAP. Rewrite the DME order to change the date on or after the face to face visit. So they can successfully process the order.

## 2023-08-25 NOTE — Telephone Encounter (Signed)
 PT states [facility] took over CPAP order. Still does not have a CPAP since order in December. Please call PT to advise. See last signed encounter. Her # is [phone]

## 2023-08-26 NOTE — Telephone Encounter (Signed)
 adrain from [facility] is calling to request written order and sleep study . We sent a fax to request documents but have not received anything yet. They are requesting that the office send this information .  Fax number [phone] Phone number [phone]

## 2023-08-26 NOTE — Telephone Encounter (Signed)
 I called PT as well and left VM> NFN

## 2023-08-26 NOTE — Telephone Encounter (Signed)
 Will call [facility] tomorrow morning. Pt has a sleep study from 2013 and asked if it needs a new sleep study.

## 2023-08-27 DIAGNOSIS — L0390 Cellulitis, unspecified: Secondary | ICD-10-CM | POA: Diagnosis not present

## 2023-08-27 NOTE — Telephone Encounter (Signed)
 Sleep documents and Referral with Last OV has been faxed over to [facility]. Received confirmation from [facility] that 2005 sleep study is good. Called the patient and they are aware that the order has been sent to [facility].

## 2023-09-02 DIAGNOSIS — L0390 Cellulitis, unspecified: Secondary | ICD-10-CM | POA: Diagnosis not present

## 2023-09-08 DIAGNOSIS — I872 Venous insufficiency (chronic) (peripheral): Secondary | ICD-10-CM | POA: Diagnosis not present

## 2023-09-15 ENCOUNTER — Ambulatory Visit (HOSPITAL_BASED_OUTPATIENT_CLINIC_OR_DEPARTMENT_OTHER): Payer: Medicare Other | Admitting: Pulmonary Disease

## 2023-09-15 ENCOUNTER — Encounter (HOSPITAL_BASED_OUTPATIENT_CLINIC_OR_DEPARTMENT_OTHER): Payer: Self-pay | Admitting: Pulmonary Disease

## 2023-09-15 VITALS — BP 124/80 | HR 67 | Ht 61.0 in | Wt 196.8 lb

## 2023-09-15 DIAGNOSIS — R918 Other nonspecific abnormal finding of lung field: Secondary | ICD-10-CM

## 2023-09-15 DIAGNOSIS — G4733 Obstructive sleep apnea (adult) (pediatric): Secondary | ICD-10-CM | POA: Diagnosis not present

## 2023-09-15 DIAGNOSIS — Z9981 Dependence on supplemental oxygen: Secondary | ICD-10-CM

## 2023-09-15 NOTE — Progress Notes (Signed)
   Subjective:    Patient ID: Isabella [name], female    DOB: 1940/05/26, 83 y.o.   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       RLL nodule noted since 2013,growth noted in 2017, gradual increase from 12 to 16 mm by 2019,  PET negative in 2013, left lower lobe nodule had lighted up and hence this was biopsied and showed initially negative in 04/2012 and then nonnecrotizing granuloma  05/2012 on repeat biopsy In 2019 CT and PET scan showed hypermetabolic enlarging right lower lobe nodule, this enlarged to 25 mm, she underwent CT guided biopsy on 12/23 which showed granulomatous inflammation   Chief Complaint  Patient presents with   Follow-up    Just received her new CPAP machine and has not set it up yet.    She has not set up her new CPAP machine received last Thursday due to distractions and concerns about a storm. She has not used a CPAP machine since December because her previous machine indicated motor issues and eventually stopped working. She possesses two old CPAP machines that are non-functional.  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 She has been undergoing regular MRIs of her abdomen and CT scans of her lungs since the surgery. The tumor was discovered incidentally during a chest x-ray.   Significant tests/ events reviewed  CT chest wo con 04/2023 >> stable nodules   CT chest without con 01/2022 unchanged nodules, right lower lobe 1.5 x 1.1 and 1.4 x 1.4, left lower lobe 1.9 x 1.1 cm   PET 01/2016 Hypermetabolic right lower lobe nodule, SUV 4.0 Scattered pulmonary nodules unchanged from 2015, right upper lobe 6 mm nodule SUV 2.0   03/2016 ENB neg     PFTs 06/2018  normal, no airway obstruction PFTs 03/2012 nml Review of Systems neg for any significant sore throat, dysphagia, itching, sneezing, nasal congestion or excess/ purulent secretions, fever, chills, sweats, unintended wt loss, pleuritic or exertional cp, hempoptysis, orthopnea pnd or change in chronic leg swelling. Also denies presyncope, palpitations, heartburn, abdominal pain, nausea, vomiting, diarrhea or change in bowel or urinary habits, dysuria,hematuria, rash, arthralgias, visual complaints, headache, numbness weakness or ataxia.     Objective:   Physical Exam  Gen. Pleasant, obese, in no distress ENT - no lesions, no post nasal drip Neck: No JVD, no thyromegaly, no carotid bruits Lungs: no use of accessory muscles, no dullness to percussion, decreased without rales or rhonchi  Cardiovascular: Rhythm regular, heart sounds  normal, no murmurs or gallops, no peripheral edema Musculoskeletal: No deformities, no cyanosis or clubbing , no tremors       Assessment & Plan:     Pulmonary nodules Pulmonary nodules are under surveillance with CT scans -Next CT due in dec 2025  OSA on CPAP Sleep apnea management was disrupted due to a malfunctioning CPAP machine since December. A new CPAP machine has been received but not yet utilized. She is advised to initiate use of the new machine, with a follow-up report to be reviewed in one month. - Instruct her to start using the new CPAP machine. - Review CPAP machine report in one month.

## 2023-09-15 NOTE — Patient Instructions (Signed)
 Nodules were stable on your last CT in dec 2024  Good luck with the new machine

## 2023-09-17 DIAGNOSIS — R600 Localized edema: Secondary | ICD-10-CM | POA: Diagnosis not present

## 2023-09-19 DIAGNOSIS — I890 Lymphedema, not elsewhere classified: Secondary | ICD-10-CM | POA: Diagnosis not present

## 2023-09-19 DIAGNOSIS — K5190 Ulcerative colitis, unspecified, without complications: Secondary | ICD-10-CM | POA: Diagnosis not present

## 2023-09-19 DIAGNOSIS — I872 Venous insufficiency (chronic) (peripheral): Secondary | ICD-10-CM | POA: Diagnosis not present

## 2023-10-16 DIAGNOSIS — K51011 Ulcerative (chronic) pancolitis with rectal bleeding: Secondary | ICD-10-CM | POA: Diagnosis not present

## 2023-11-12 DIAGNOSIS — M25512 Pain in left shoulder: Secondary | ICD-10-CM | POA: Diagnosis not present

## 2023-12-11 DIAGNOSIS — R21 Rash and other nonspecific skin eruption: Secondary | ICD-10-CM | POA: Diagnosis not present

## 2023-12-11 DIAGNOSIS — B372 Candidiasis of skin and nail: Secondary | ICD-10-CM | POA: Diagnosis not present

## 2023-12-11 DIAGNOSIS — I872 Venous insufficiency (chronic) (peripheral): Secondary | ICD-10-CM | POA: Diagnosis not present

## 2023-12-23 DIAGNOSIS — M542 Cervicalgia: Secondary | ICD-10-CM | POA: Diagnosis not present

## 2023-12-23 DIAGNOSIS — M7989 Other specified soft tissue disorders: Secondary | ICD-10-CM | POA: Diagnosis not present

## 2023-12-23 DIAGNOSIS — M25512 Pain in left shoulder: Secondary | ICD-10-CM | POA: Diagnosis not present

## 2023-12-30 DIAGNOSIS — M25612 Stiffness of left shoulder, not elsewhere classified: Secondary | ICD-10-CM | POA: Diagnosis not present

## 2023-12-30 DIAGNOSIS — M542 Cervicalgia: Secondary | ICD-10-CM | POA: Diagnosis not present

## 2023-12-30 DIAGNOSIS — M25512 Pain in left shoulder: Secondary | ICD-10-CM | POA: Diagnosis not present

## 2023-12-30 DIAGNOSIS — M25511 Pain in right shoulder: Secondary | ICD-10-CM | POA: Diagnosis not present

## 2023-12-31 DIAGNOSIS — K51011 Ulcerative (chronic) pancolitis with rectal bleeding: Secondary | ICD-10-CM | POA: Diagnosis not present

## 2024-01-05 DIAGNOSIS — M25512 Pain in left shoulder: Secondary | ICD-10-CM | POA: Diagnosis not present

## 2024-01-05 DIAGNOSIS — M25612 Stiffness of left shoulder, not elsewhere classified: Secondary | ICD-10-CM | POA: Diagnosis not present

## 2024-01-05 DIAGNOSIS — M25511 Pain in right shoulder: Secondary | ICD-10-CM | POA: Diagnosis not present

## 2024-01-05 DIAGNOSIS — M542 Cervicalgia: Secondary | ICD-10-CM | POA: Diagnosis not present

## 2024-01-07 DIAGNOSIS — M25612 Stiffness of left shoulder, not elsewhere classified: Secondary | ICD-10-CM | POA: Diagnosis not present

## 2024-01-07 DIAGNOSIS — M542 Cervicalgia: Secondary | ICD-10-CM | POA: Diagnosis not present

## 2024-01-07 DIAGNOSIS — M25512 Pain in left shoulder: Secondary | ICD-10-CM | POA: Diagnosis not present

## 2024-01-07 DIAGNOSIS — M25511 Pain in right shoulder: Secondary | ICD-10-CM | POA: Diagnosis not present

## 2024-01-15 DIAGNOSIS — M25511 Pain in right shoulder: Secondary | ICD-10-CM | POA: Diagnosis not present

## 2024-01-15 DIAGNOSIS — M542 Cervicalgia: Secondary | ICD-10-CM | POA: Diagnosis not present

## 2024-01-15 DIAGNOSIS — M25612 Stiffness of left shoulder, not elsewhere classified: Secondary | ICD-10-CM | POA: Diagnosis not present

## 2024-01-15 DIAGNOSIS — M25512 Pain in left shoulder: Secondary | ICD-10-CM | POA: Diagnosis not present

## 2024-01-21 DIAGNOSIS — K5100 Ulcerative (chronic) pancolitis without complications: Secondary | ICD-10-CM | POA: Diagnosis not present

## 2024-01-22 DIAGNOSIS — M25612 Stiffness of left shoulder, not elsewhere classified: Secondary | ICD-10-CM | POA: Diagnosis not present

## 2024-01-22 DIAGNOSIS — M542 Cervicalgia: Secondary | ICD-10-CM | POA: Diagnosis not present

## 2024-01-22 DIAGNOSIS — M25511 Pain in right shoulder: Secondary | ICD-10-CM | POA: Diagnosis not present

## 2024-01-22 DIAGNOSIS — M25512 Pain in left shoulder: Secondary | ICD-10-CM | POA: Diagnosis not present

## 2024-01-27 DIAGNOSIS — M25612 Stiffness of left shoulder, not elsewhere classified: Secondary | ICD-10-CM | POA: Diagnosis not present

## 2024-01-27 DIAGNOSIS — M25511 Pain in right shoulder: Secondary | ICD-10-CM | POA: Diagnosis not present

## 2024-01-27 DIAGNOSIS — M542 Cervicalgia: Secondary | ICD-10-CM | POA: Diagnosis not present

## 2024-01-27 DIAGNOSIS — M25512 Pain in left shoulder: Secondary | ICD-10-CM | POA: Diagnosis not present

## 2024-01-30 DIAGNOSIS — Z23 Encounter for immunization: Secondary | ICD-10-CM | POA: Diagnosis not present

## 2024-02-03 DIAGNOSIS — M25511 Pain in right shoulder: Secondary | ICD-10-CM | POA: Diagnosis not present

## 2024-02-03 DIAGNOSIS — M25612 Stiffness of left shoulder, not elsewhere classified: Secondary | ICD-10-CM | POA: Diagnosis not present

## 2024-02-03 DIAGNOSIS — M542 Cervicalgia: Secondary | ICD-10-CM | POA: Diagnosis not present

## 2024-02-03 DIAGNOSIS — M25512 Pain in left shoulder: Secondary | ICD-10-CM | POA: Diagnosis not present

## 2024-02-05 DIAGNOSIS — M25612 Stiffness of left shoulder, not elsewhere classified: Secondary | ICD-10-CM | POA: Diagnosis not present

## 2024-02-05 DIAGNOSIS — M25511 Pain in right shoulder: Secondary | ICD-10-CM | POA: Diagnosis not present

## 2024-02-05 DIAGNOSIS — M25512 Pain in left shoulder: Secondary | ICD-10-CM | POA: Diagnosis not present

## 2024-02-05 DIAGNOSIS — M542 Cervicalgia: Secondary | ICD-10-CM | POA: Diagnosis not present

## 2024-02-06 DIAGNOSIS — L739 Follicular disorder, unspecified: Secondary | ICD-10-CM | POA: Diagnosis not present

## 2024-02-06 DIAGNOSIS — Z23 Encounter for immunization: Secondary | ICD-10-CM | POA: Diagnosis not present

## 2024-02-06 DIAGNOSIS — Z79899 Other long term (current) drug therapy: Secondary | ICD-10-CM | POA: Diagnosis not present

## 2024-02-10 DIAGNOSIS — M25612 Stiffness of left shoulder, not elsewhere classified: Secondary | ICD-10-CM | POA: Diagnosis not present

## 2024-02-10 DIAGNOSIS — M542 Cervicalgia: Secondary | ICD-10-CM | POA: Diagnosis not present

## 2024-02-10 DIAGNOSIS — M25511 Pain in right shoulder: Secondary | ICD-10-CM | POA: Diagnosis not present

## 2024-02-10 DIAGNOSIS — M25512 Pain in left shoulder: Secondary | ICD-10-CM | POA: Diagnosis not present

## 2024-02-12 ENCOUNTER — Other Ambulatory Visit: Payer: Self-pay | Admitting: Urology

## 2024-02-12 DIAGNOSIS — M25512 Pain in left shoulder: Secondary | ICD-10-CM | POA: Diagnosis not present

## 2024-02-12 DIAGNOSIS — D49512 Neoplasm of unspecified behavior of left kidney: Secondary | ICD-10-CM

## 2024-02-12 DIAGNOSIS — M542 Cervicalgia: Secondary | ICD-10-CM | POA: Diagnosis not present

## 2024-02-12 DIAGNOSIS — M25612 Stiffness of left shoulder, not elsewhere classified: Secondary | ICD-10-CM | POA: Diagnosis not present

## 2024-02-12 DIAGNOSIS — M25511 Pain in right shoulder: Secondary | ICD-10-CM | POA: Diagnosis not present

## 2024-02-17 DIAGNOSIS — M25512 Pain in left shoulder: Secondary | ICD-10-CM | POA: Diagnosis not present

## 2024-02-17 DIAGNOSIS — M542 Cervicalgia: Secondary | ICD-10-CM | POA: Diagnosis not present

## 2024-02-26 DIAGNOSIS — M542 Cervicalgia: Secondary | ICD-10-CM | POA: Diagnosis not present

## 2024-02-26 DIAGNOSIS — M25511 Pain in right shoulder: Secondary | ICD-10-CM | POA: Diagnosis not present

## 2024-02-26 DIAGNOSIS — M25612 Stiffness of left shoulder, not elsewhere classified: Secondary | ICD-10-CM | POA: Diagnosis not present

## 2024-02-26 DIAGNOSIS — M25512 Pain in left shoulder: Secondary | ICD-10-CM | POA: Diagnosis not present

## 2024-02-27 DIAGNOSIS — L929 Granulomatous disorder of the skin and subcutaneous tissue, unspecified: Secondary | ICD-10-CM | POA: Diagnosis not present

## 2024-02-27 DIAGNOSIS — L309 Dermatitis, unspecified: Secondary | ICD-10-CM | POA: Diagnosis not present

## 2024-03-01 DIAGNOSIS — M25512 Pain in left shoulder: Secondary | ICD-10-CM | POA: Diagnosis not present

## 2024-03-01 DIAGNOSIS — M542 Cervicalgia: Secondary | ICD-10-CM | POA: Diagnosis not present

## 2024-03-01 DIAGNOSIS — M25511 Pain in right shoulder: Secondary | ICD-10-CM | POA: Diagnosis not present

## 2024-03-01 DIAGNOSIS — M25612 Stiffness of left shoulder, not elsewhere classified: Secondary | ICD-10-CM | POA: Diagnosis not present

## 2024-03-04 DIAGNOSIS — M542 Cervicalgia: Secondary | ICD-10-CM | POA: Diagnosis not present

## 2024-03-04 DIAGNOSIS — M25612 Stiffness of left shoulder, not elsewhere classified: Secondary | ICD-10-CM | POA: Diagnosis not present

## 2024-03-04 DIAGNOSIS — M25512 Pain in left shoulder: Secondary | ICD-10-CM | POA: Diagnosis not present

## 2024-03-04 DIAGNOSIS — M25511 Pain in right shoulder: Secondary | ICD-10-CM | POA: Diagnosis not present

## 2024-03-08 DIAGNOSIS — M542 Cervicalgia: Secondary | ICD-10-CM | POA: Diagnosis not present

## 2024-03-08 DIAGNOSIS — M25612 Stiffness of left shoulder, not elsewhere classified: Secondary | ICD-10-CM | POA: Diagnosis not present

## 2024-03-08 DIAGNOSIS — M25512 Pain in left shoulder: Secondary | ICD-10-CM | POA: Diagnosis not present

## 2024-03-08 DIAGNOSIS — M25511 Pain in right shoulder: Secondary | ICD-10-CM | POA: Diagnosis not present

## 2024-03-10 DIAGNOSIS — M25612 Stiffness of left shoulder, not elsewhere classified: Secondary | ICD-10-CM | POA: Diagnosis not present

## 2024-03-10 DIAGNOSIS — M25511 Pain in right shoulder: Secondary | ICD-10-CM | POA: Diagnosis not present

## 2024-03-10 DIAGNOSIS — M25512 Pain in left shoulder: Secondary | ICD-10-CM | POA: Diagnosis not present

## 2024-03-10 DIAGNOSIS — M542 Cervicalgia: Secondary | ICD-10-CM | POA: Diagnosis not present

## 2024-03-23 ENCOUNTER — Other Ambulatory Visit (HOSPITAL_BASED_OUTPATIENT_CLINIC_OR_DEPARTMENT_OTHER): Payer: Self-pay | Admitting: Family Medicine

## 2024-03-23 DIAGNOSIS — E2839 Other primary ovarian failure: Secondary | ICD-10-CM

## 2024-04-16 ENCOUNTER — Ambulatory Visit
Admission: RE | Admit: 2024-04-16 | Discharge: 2024-04-16 | Disposition: A | Discharge status: Home / Self Care | Source: Ambulatory Visit | Attending: Urology | Admitting: Urology

## 2024-04-16 DIAGNOSIS — D49512 Neoplasm of unspecified behavior of left kidney: Secondary | ICD-10-CM

## 2024-04-16 MED ORDER — GADOPICLENOL 0.5 MMOL/ML IV SOLN
9.0000 mL | Freq: Once | INTRAVENOUS | Status: AC | PRN
  Administered 2024-04-16: 9 mL via INTRAVENOUS

## 2024-05-05 ENCOUNTER — Ambulatory Visit (HOSPITAL_BASED_OUTPATIENT_CLINIC_OR_DEPARTMENT_OTHER)

## 2024-05-11 ENCOUNTER — Ambulatory Visit (HOSPITAL_BASED_OUTPATIENT_CLINIC_OR_DEPARTMENT_OTHER)

## 2024-05-11 ENCOUNTER — Encounter (HOSPITAL_BASED_OUTPATIENT_CLINIC_OR_DEPARTMENT_OTHER): Payer: Self-pay

## 2024-05-11 VITALS — BP 167/81 | HR 75 | Ht 61.0 in | Wt 187.0 lb

## 2024-05-11 DIAGNOSIS — R911 Solitary pulmonary nodule: Secondary | ICD-10-CM | POA: Diagnosis not present

## 2024-05-11 DIAGNOSIS — G4733 Obstructive sleep apnea (adult) (pediatric): Secondary | ICD-10-CM

## 2024-05-11 DIAGNOSIS — R918 Other nonspecific abnormal finding of lung field: Secondary | ICD-10-CM

## 2024-05-11 NOTE — Patient Instructions (Signed)
 Complete Chest CT as ordered.  Follow up in 4 weeks.

## 2024-05-11 NOTE — Progress Notes (Signed)
 "  @Patient  ID: Isabella [name], female    DOB: Apr 30, 1941, 83 y.o.   MRN: [id number]  Chief Complaint  Patient presents with   Follow-up    CT    Referring provider: Chrystal Lamarr RAMAN, MD  HPI: Discussed the use of AI scribe software for clinical note transcription with the patient, who gave verbal consent to proceed.  History of Present Illness Isabella [name] is an 83 year old female with a history of kidney cancer and lung nodules who presents for evaluation of a lung nodule noted on recent imaging.  She has a history of lung nodules that have been stable for several years. A recent MRI of the abdomen on December 5th incidentally noted a nodule in the right lung, which was also present on a CT scan from 2023 but had increased in dimensions. She recalls a needle biopsy was performed on a lung nodule many years ago, which was reportedly normal.  Review of her chart indicates she had a biopsy of a RLL nodule that was granulomatous only.  She experiences shortness of breath when walking short distances, attributing this to her focus on stumbling rather than a change in her respiratory status.  She has experienced significant unintentional weight loss, approximately 30 pounds, which her family attributes to inadequate nutrition. Her appetite has been poor, leading to feelings of shakiness and weakness when she does not eat. She reports feeling 'real bad' and drained, with increased forgetfulness.  She has a history of two heart attacks, one in 2012 with a stent placement and another in 2019 without stent placement.  She has a history of sleep apnea diagnosed approximately 20 years ago, for which she was prescribed CPAP. However, she does not use the CPAP machine due to discomfort and noise, despite having it available.   Last OV 09/15/2023 Mathias): She has not set up her new CPAP machine received last Thursday due to distractions and concerns about a storm. She has not used a CPAP machine  since December because her previous machine indicated motor issues and eventually stopped working. She possesses two old CPAP machines that are non-functional.   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 She has been undergoing regular MRIs of her abdomen and CT scans of her lungs since the surgery. The tumor was discovered incidentally during a chest x-ray.     TEST/EVENTS :    CT chest wo con 04/2023 >> stable nodules   CT chest without con 01/2022 unchanged nodules, right lower lobe 1.5 x 1.1 and 1.4 x 1.4, left lower lobe 1.9 x 1.1 cm   PET 01/2016 Hypermetabolic right lower lobe nodule, SUV 4.0 Scattered pulmonary nodules unchanged from 2015, right upper lobe 6 mm nodule SUV 2.0   03/2016 ENB neg     PFTs 06/2018 normal, no airway obstruction PFTs 03/2012 nml  RLL nodule noted since 2013,growth noted in 2017, gradual increase from 12 to 16 mm by 2019,  PET negative in 2013, left lower lobe nodule had lighted up and hence this was biopsied and showed initially negative in 04/2012 and then nonnecrotizing granuloma  05/2012 on repeat biopsy In 2019 CT and PET scan showed hypermetabolic enlarging right lower lobe nodule, this enlarged to 25 mm, she underwent CT guided biopsy on 12/23 which showed granulomatous inflammation   Allergies[1]  Immunization History  Administered Date(s) Administered   INFLUENZA, HIGH DOSE  SEASONAL PF 03/29/2016, 01/31/2017, 01/29/2018   Influenza,inj,Quad PF,6+ Mos 03/29/2013, 04/06/2014, 05/14/2015   Influenza-Unspecified 03/13/2020   Moderna Sars-Covid-2 Vaccination 03/24/2020   PFIZER SARS-COV-2 Pediatric Vaccination 5-70yrs 06/24/2019, 07/19/2019   Pneumococcal Conjugate-13 10/11/2013   Pneumococcal Polysaccharide-23 08/29/2021    Past Medical History:  Diagnosis Date   Arthritis    Complication of anesthesia    Took a while to wake up with knee. Avoid  pancuronium   Coronary artery disease    MI 2012, 2019, Dr. 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 (states it was a partial)   Headache    migraines in her younger years   Hemorrhoids    High cholesterol    History of blood transfusion    Hx of adenomatous colonic polyps    Hypertension    Liposarcoma of retroperitoneum (HCC)    Lung nodule    Lymphedema    BLE   Myocardial infarction (HCC)    x2 2012 and 2019   Nocturnal leg cramps    Obesity    Sleep apnea    uses cpap    Tobacco History: Tobacco Use History[2] Counseling given: Not Answered   Outpatient Medications Prior to Visit  Medication Sig Dispense Refill   acetaminophen  (TYLENOL ) 500 MG tablet Take 500 mg by mouth every 6 (six) hours as needed for mild pain.     aspirin  EC 81 MG tablet Take 81 mg by mouth daily at 2 PM.      atorvastatin  (LIPITOR ) 80 MG tablet Take 1 tablet (80 mg total) by mouth daily. 90 tablet 2   chlorpheniramine (CHLOR-TRIMETON) 4 MG tablet Take 8 mg by mouth at bedtime.      Cholecalciferol (D3 PO) Take 2,000 mg by mouth.     furosemide  (LASIX ) 40 MG tablet Take 40 mg by mouth daily.     mesalamine  (APRISO ) 0.375 g 24 hr capsule Take 1,500 mg by mouth daily.     metoprolol  tartrate (LOPRESSOR ) 12.5 mg TABS tablet Take 12.5 mg by mouth 2 (two) times daily.     Multiple Vitamins-Minerals (MULTI FOR HER 50+) TABS Take 1 tablet by mouth daily.     nitroGLYCERIN  (NITROSTAT ) 0.4 MG SL tablet Place 1 tablet (0.4 mg total) under the tongue every 5 (five) minutes as needed for chest pain. 25 tablet 5   potassium chloride  SA (K-DUR,KLOR-CON ) 20 MEQ tablet Take 10 mEq by mouth once.     predniSONE  (DELTASONE ) 5 MG tablet      REMICADE 100 MG injection Inject 100 mg into the vein as directed.     saccharomyces  boulardii (FLORASTOR) 250 MG capsule Take 250 mg by mouth 2 (two) times daily.     UNABLE TO FIND CPAP: At bedtime nightly and during all naps     No facility-administered medications prior to visit.     Review of Systems: as per hpi  Constitutional:   No  weight loss, night sweats,  Fevers, chills, fatigue, or  lassitude.  HEENT:   No headaches,  Difficulty swallowing,  Tooth/dental problems, or  Sore throat,                No sneezing, itching, ear ache, nasal congestion, post nasal drip,   CV:  No  chest pain,  Orthopnea, PND, swelling in lower extremities, anasarca, dizziness, palpitations, syncope.   GI  No heartburn, indigestion, abdominal pain, nausea, vomiting, diarrhea, change in bowel habits, loss of appetite, bloody stools.   Resp: No shortness of breath with exertion or at rest.  No excess mucus, no productive cough,  No non-productive cough,  No coughing up of blood.  No change in color of mucus.  No wheezing.  No chest wall deformity  Skin: no rash or lesions.  GU: no dysuria, change in color of urine, no urgency or frequency.  No flank pain, no hematuria   MS:  No joint pain or swelling.  No decreased range of motion.  No back pain.    Physical Exam  BP (!) 167/81   Pulse 75   Ht 5' 1 (1.549 m)   Wt 187 lb (84.8 kg)   SpO2 98%   BMI 35.33 kg/m   GEN: A/Ox3; pleasant , NAD, well nourished    HEENT:  [place]/AT,  EACs-clear, TMs-wnl, NOSE-clear, THROAT-clear, no lesions, no postnasal drip or exudate noted.   NECK:  Supple w/ fair ROM; no JVD; normal carotid impulses w/o bruits; no thyromegaly or nodules palpated; no lymphadenopathy.    RESP  Clear  P & A; w/o, wheezes/ rales/ or rhonchi. no accessory muscle use, no dullness to percussion  CARD:  RRR, no m/r/g, no peripheral edema, pulses intact, no cyanosis or clubbing.  GI:   Soft & nt; nml bowel sounds; no organomegaly or masses detected.   Musco: Warm bil, no deformities or joint swelling noted.   Neuro:  alert, no focal deficits noted.    Skin: Warm, no lesions or rashes    Lab Results:  CBC    Component Value Date/Time   WBC 4.4 03/08/2022 0203   RBC 4.20 03/08/2022 0203   HGB 13.0 03/08/2022 0203   HCT 40.4 03/08/2022 0203   PLT 224 03/08/2022 0203   MCV 96.2 03/08/2022 0203   MCH 31.0 03/08/2022 0203   MCHC 32.2 03/08/2022 0203   RDW 14.9 03/08/2022 0203   LYMPHSABS 0.9 03/08/2022 0203   MONOABS 0.6 03/08/2022 0203   EOSABS 0.2 03/08/2022 0203   BASOSABS 0.0 03/08/2022 0203    BMET    Component Value Date/Time   NA 140 03/08/2022 0203   NA 142 01/09/2022 1543   K 4.4 03/08/2022 0203   CL 101 03/08/2022 0203   CO2 26 03/08/2022 0203   GLUCOSE 108 (H) 03/08/2022 0203   BUN 17 03/08/2022 0203   BUN 18 01/09/2022 1543   CREATININE 1.07 (H) 03/08/2022 0203   CREATININE 0.80 08/04/2012 0215   CALCIUM  9.3 03/08/2022 0203   GFRNONAA 53 (L) 03/08/2022 0203   GFRAA >60 03/11/2019 0542    BNP No results found for: BNP  ProBNP    Component Value Date/Time   PROBNP 458 01/09/2022 1543    Imaging: MR ABDOMEN WWO CONTRAST Result Date: 04/16/2024 CLINICAL DATA:  History provided by technologist open Left kidney cancer. Left kidney removed 2021. Follow-up. EXAM: MRI ABDOMEN WITHOUT AND WITH CONTRAST TECHNIQUE: Multiplanar multisequence MR imaging of the abdomen was performed both before and after the administration of intravenous contrast. CONTRAST:  9 mL of Vueway . COMPARISON:  MRI abdomen from 04/21/2023. FINDINGS: Lower chest: There is a 1.5 x 1.6 cm nodule in the right lung lower lobe, posteromedially which has slightly increased in size since the prior study. This is highly concerning for metastatic disease. Further evaluation with chest CT  scan is recommended, unless recently performed. Otherwise unremarkable MR appearance to the lung bases. No pleural effusion. No pericardial effusion. Normal heart size. Hepatobiliary: The liver is normal in size. Noncirrhotic  configuration. There are multiple scattered simple cysts throughout the liver with largest in the right hepatic lobe, segment 6 measuring up to 3.7 x 4.2 cm. No intrahepatic or extrahepatic bile duct dilatation. No choledocholithiasis. Status post cholecystectomy. Pancreas: Redemonstration of multiple T2 hyperintense nonenhancing foci throughout the pancreas (marked with electronic arrow sign on series 5). The largest such lesion is in the pancreatic body measuring up to 1.0 x 1.6 cm which appears grossly similar to the prior study. No mural nodule or internal enhancement. No communication with main pancreatic duct or side branch can be identified on this exam. No suspicious pancreatic lesion. No peripancreatic fat stranding. Main pancreatic duct is not dilated. Spleen:  Within normal limits in size and appearance. No focal mass. Adrenals/Urinary Tract: Unremarkable adrenal glands. Surgically absent left kidney. No local recurrent tumor noted in the left nephrectomy bed. No right hydroureteronephrosis. Redemonstration of multiple simple cysts in the right kidney with largest arising laterally from the interpolar region measuring up to 4.1 x 4.4 cm. No suspicious renal lesion. There are several T1 hyperintense proteinaceous/hemorrhagic cysts in the right kidney (marked with electronic arrow sign on series 10). No suspicious renal lesion. Stomach/Bowel: Visualized portions within the abdomen are unremarkable. No disproportionate dilation of bowel loops. There are multiple colonic diverticula without diverticulitis. Vascular/Lymphatic: No pathologically enlarged lymph nodes identified. No abdominal aortic aneurysm demonstrated. No ascites. Other:  None. Musculoskeletal: No suspicious bone lesions identified. IMPRESSION: 1. There is a 1.5 x 1.6 cm nodule in the right lung lower lobe, posteromedially which has slightly increased in size since the prior study. This is highly concerning for metastatic disease. Further  evaluation with chest CT scan is recommended, unless recently performed. 2. No local recurrent tumor in the left nephrectomy bed. No metastatic disease identified within the abdomen. 3. Redemonstration of multiple cystic lesions throughout the pancreas with largest in the pancreatic body measuring up to 1.0 x 1.6 cm. No suspicious features. Continued follow-up is recommended. 4. Multiple other nonacute observations, as described above. Electronically Signed   By: Ree Molt M.D.   On: 04/16/2024 15:37    Administration History     None          Latest Ref Rng & Units 07/06/2018   12:04 PM  PFT Results  FVC-Pre L 2.56   FVC-Predicted Pre % 95   FVC-Post L 2.46   FVC-Predicted Post % 91   Pre FEV1/FVC % % 78   Post FEV1/FCV % % 78   FEV1-Pre L 2.01   FEV1-Predicted Pre % 99   FEV1-Post L 1.93   DLCO uncorrected ml/min/mmHg 19.83   DLCO UNC% % 107   DLVA Predicted % 114   TLC L 4.66   TLC % Predicted % 94   RV % Predicted % 98     No results found for: NITRICOXIDE   Assessment & Plan:   Assessment & Plan Pulmonary nodules  Obstructive sleep apnea  Assessment and Plan Assessment & Plan Pulmonary nodule Pulmonary nodule increased in size by ~ 0.5 cm over two years. Size discrepancy may be due to imaging modality differences between CT and MRI and MRI artifact. Previous needle biopsy normal. - Ordered dedicated chest CT to compare with 2023 CT and assess changes. - Further workup pending these results.   Return in  about 4 weeks (around 06/08/2024) for CT results.  Candis Dandy, PA-C 05/11/2024      [1]  Allergies Allergen Reactions   Codeine Shortness Of Breath and Other (See Comments)    Made back hurt; does not affect liver per patient but has has caused difficulty breathing/tolerates Percocet only in small doses Back hurts does not affect liver per patient but has has caused difficulty breathing but tolerates Percocet only at small doses   Imodium  A-D  [Loperamide  Hcl] Shortness Of Breath and Other (See Comments)    Caused a pain & squeezing sensation in the lungs also   Neomycin Swelling and Other (See Comments)    Reaction to eye ointment - eyes became swollen shut Makes wounds ooze   Penicillinase Hives, Shortness Of Breath and Rash    Has patient had a PCN reaction causing immediate rash, facial/tongue/throat swelling, SOB or lightheadedness with hypotension: Yes Has patient had a PCN reaction causing severe rash involving mucus membranes or skin necrosis: No Has patient had a PCN reaction that required hospitalization:No Has patient had a PCN reaction occurring within the last 10 years: No If all of the above answers are NO, then may proceed with Cephalosporin use.    Penicillins Hives, Shortness Of Breath and Rash    Reaction @ 18-yrs old Has patient had a PCN reaction causing immediate rash, facial/tongue/throat swelling, SOB or lightheadedness with hypotension: Yes Has patient had a PCN reaction causing severe rash involving mucus membranes or skin necrosis: No Has patient had a PCN reaction that required hospitalization: No Has patient had a PCN reaction occurring within the last 10 years: No If all of the above answers are NO, then may proceed with Cephalosporin use.   Tramadol  Shortness Of Breath and Other (See Comments)    (Per patient) felt like lungs were being squeezed    Lisinopril Cough   Hornet Venom Swelling    Swelling at site of sting   Adhesive [Tape]     Tears the skin, paper tape is ok   Erythromycin Diarrhea   Hydromorphone  Other (See Comments)    Severe Lethargy     Percocet [Oxycodone -Acetaminophen ] Other (See Comments)    Confusion   [2]  Social History Tobacco Use  Smoking Status Never  Smokeless Tobacco Never   "

## 2024-05-27 ENCOUNTER — Ambulatory Visit (HOSPITAL_BASED_OUTPATIENT_CLINIC_OR_DEPARTMENT_OTHER)
Admission: RE | Admit: 2024-05-27 | Discharge: 2024-05-27 | Disposition: A | Discharge status: Home / Self Care | Source: Ambulatory Visit

## 2024-05-27 DIAGNOSIS — R918 Other nonspecific abnormal finding of lung field: Secondary | ICD-10-CM | POA: Insufficient documentation

## 2024-06-03 ENCOUNTER — Other Ambulatory Visit: Payer: Self-pay | Admitting: Orthopaedic Surgery

## 2024-06-03 DIAGNOSIS — M19012 Primary osteoarthritis, left shoulder: Secondary | ICD-10-CM

## 2024-06-04 ENCOUNTER — Telehealth (HOSPITAL_BASED_OUTPATIENT_CLINIC_OR_DEPARTMENT_OTHER): Payer: Self-pay

## 2024-06-04 NOTE — Telephone Encounter (Signed)
"  ° °  Pre-operative Risk Assessment    Patient Name: Isabella [name]  DOB: [birth year] MRN: [id number]   Date of last office visit: 03/07/2023 with Dr. Verlin Date of next office visit: 06/14/2024 with Dr. Verlin  Request for Surgical Clearance    Procedure:  Left reverse total shoulder arthroplasty  Date of Surgery:  Clearance TBD                                 Surgeon:  Dr. Bonner Hair Surgeon's Group or Practice Name:  [facility] Phone number:  [phone] Fax number:  [phone]   Type of Clearance Requested:   - Medical  - Pharmacy:  [facility]  does not specify   Type of Anesthesia:  does not specify   Additional requests/questions:  None  SignedPatrcia Iverson CROME   06/04/2024, 10:04 AM   "

## 2024-06-04 NOTE — Telephone Encounter (Signed)
 Attempted to contact the patient in regards to OV preop clearance. Pt has an upcoming appt on 06/14/24, wanted to verify with the patient if he'd like to wait until then. Preop added to appt notes and LVM for the patient if he has any questions or concerns.

## 2024-06-04 NOTE — Telephone Encounter (Signed)
" ° °  Name: Isabella [name]  DOB: 1940/05/27  MRN: [id number]  Primary Cardiologist: Lonni Cash, MD  Chart reviewed as part of pre-operative protocol coverage. Because of Isabella [name]'s past medical history and time since last visit, she will require a follow-up in-office visit in order to better assess preoperative cardiovascular risk.  Pre-op covering staff: - Please schedule appointment and call patient to inform them. If patient already had an upcoming appointment within acceptable timeframe, please add pre-op clearance to the appointment notes so provider is aware. -Visit with Dr. Cash 06/14/24.   Isabella KATHEE Pizza, FNP  06/04/2024, 10:13 AM  "

## 2024-06-08 ENCOUNTER — Ambulatory Visit (HOSPITAL_BASED_OUTPATIENT_CLINIC_OR_DEPARTMENT_OTHER)

## 2024-06-11 ENCOUNTER — Other Ambulatory Visit: Payer: Self-pay | Admitting: Cardiovascular Disease

## 2024-06-14 ENCOUNTER — Other Ambulatory Visit: Payer: Self-pay | Admitting: Cardiovascular Disease

## 2024-06-14 ENCOUNTER — Ambulatory Visit: Admitting: Cardiovascular Disease

## 2024-06-15 ENCOUNTER — Other Ambulatory Visit

## 2024-06-16 ENCOUNTER — Telehealth: Payer: Self-pay

## 2024-06-16 NOTE — Telephone Encounter (Signed)
 Fax received from Dr. Bonner Hair with [facility] to perform a left reverse total shoulder arthroplasty under general anesthesia with interscalene block on patient.  Patient needs surgery clearance. Surgery date is TBD. Patient was seen on 05/11/24. Office protocol is a risk assessment can be sent to surgeon if patient has been seen in 60 days or less.   Sending to [place] for risk assessment or recommendations if patient needs to be seen in office prior to surgical procedure.

## 2024-06-18 ENCOUNTER — Inpatient Hospital Stay: Admission: RE | Admit: 2024-06-18

## 2024-06-18 DIAGNOSIS — M19012 Primary osteoarthritis, left shoulder: Secondary | ICD-10-CM

## 2024-06-21 ENCOUNTER — Ambulatory Visit (HOSPITAL_BASED_OUTPATIENT_CLINIC_OR_DEPARTMENT_OTHER)

## 2024-07-23 ENCOUNTER — Ambulatory Visit: Admitting: Cardiology
